# Patient Record
Sex: Female | Born: 1935 | Race: White | Hispanic: No | Smoking: Former smoker
Health system: Southern US, Community
[De-identification: ages and names within clinical notes are randomized; demographics above are authoritative.]

## PROBLEM LIST (undated history)

## (undated) DIAGNOSIS — R4701 Aphasia: Secondary | ICD-10-CM

## (undated) DIAGNOSIS — I313 Pericardial effusion (noninflammatory): Secondary | ICD-10-CM

## (undated) DIAGNOSIS — M1990 Unspecified osteoarthritis, unspecified site: Secondary | ICD-10-CM

## (undated) DIAGNOSIS — R002 Palpitations: Secondary | ICD-10-CM

## (undated) DIAGNOSIS — I480 Paroxysmal atrial fibrillation: Secondary | ICD-10-CM

## (undated) DIAGNOSIS — I619 Nontraumatic intracerebral hemorrhage, unspecified: Principal | ICD-10-CM

## (undated) DIAGNOSIS — F32A Depression, unspecified: Secondary | ICD-10-CM

## (undated) DIAGNOSIS — R51 Headache: Secondary | ICD-10-CM

## (undated) DIAGNOSIS — I10 Essential (primary) hypertension: Secondary | ICD-10-CM

## (undated) DIAGNOSIS — I4891 Unspecified atrial fibrillation: Principal | ICD-10-CM

## (undated) DIAGNOSIS — E785 Hyperlipidemia, unspecified: Secondary | ICD-10-CM

## (undated) DIAGNOSIS — Z950 Presence of cardiac pacemaker: Secondary | ICD-10-CM

## (undated) DIAGNOSIS — R600 Localized edema: Secondary | ICD-10-CM

## (undated) DIAGNOSIS — F329 Major depressive disorder, single episode, unspecified: Secondary | ICD-10-CM

## (undated) DIAGNOSIS — M069 Rheumatoid arthritis, unspecified: Secondary | ICD-10-CM

## (undated) DIAGNOSIS — I495 Sick sinus syndrome: Secondary | ICD-10-CM

## (undated) DIAGNOSIS — R609 Edema, unspecified: Secondary | ICD-10-CM

## (undated) DIAGNOSIS — R0602 Shortness of breath: Secondary | ICD-10-CM

## (undated) DIAGNOSIS — I3139 Other pericardial effusion (noninflammatory): Secondary | ICD-10-CM

## (undated) DIAGNOSIS — R519 Headache, unspecified: Secondary | ICD-10-CM

## (undated) DIAGNOSIS — I341 Nonrheumatic mitral (valve) prolapse: Secondary | ICD-10-CM

## (undated) DIAGNOSIS — I639 Cerebral infarction, unspecified: Secondary | ICD-10-CM

## (undated) HISTORY — DX: Aphasia: R47.01

## (undated) HISTORY — DX: Hyperlipidemia, unspecified: E78.5

## (undated) HISTORY — DX: Nonrheumatic mitral (valve) prolapse: I34.1

## (undated) HISTORY — DX: Headache, unspecified: R51.9

## (undated) HISTORY — DX: Palpitations: R00.2

## (undated) HISTORY — DX: Headache: R51

## (undated) HISTORY — PX: APPENDECTOMY: SHX54

## (undated) HISTORY — PX: BOWEL RESECTION: SHX1257

## (undated) HISTORY — DX: Paroxysmal atrial fibrillation: I48.0

## (undated) HISTORY — PX: PACEMAKER INSERTION: SHX728

## (undated) HISTORY — PX: BREAST LUMPECTOMY: SHX2

## (undated) HISTORY — DX: Localized edema: R60.0

## (undated) HISTORY — DX: Shortness of breath: R06.02

## (undated) HISTORY — PX: ABDOMINAL HYSTERECTOMY: SHX81

## (undated) HISTORY — DX: Cerebral infarction, unspecified: I63.9

## (undated) HISTORY — DX: Rheumatoid arthritis, unspecified: M06.9

## (undated) HISTORY — DX: Edema, unspecified: R60.9

## (undated) HISTORY — DX: Sick sinus syndrome: I49.5

---

## 2003-08-18 ENCOUNTER — Other Ambulatory Visit: Payer: Self-pay

## 2004-11-01 ENCOUNTER — Ambulatory Visit: Payer: Self-pay | Admitting: Ophthalmology

## 2004-11-13 ENCOUNTER — Ambulatory Visit: Payer: Self-pay | Admitting: Ophthalmology

## 2005-01-10 ENCOUNTER — Ambulatory Visit: Payer: Self-pay | Admitting: Ophthalmology

## 2005-01-15 ENCOUNTER — Ambulatory Visit: Payer: Self-pay | Admitting: Ophthalmology

## 2005-01-22 ENCOUNTER — Encounter: Payer: Self-pay | Admitting: Family Medicine

## 2005-02-21 ENCOUNTER — Encounter: Payer: Self-pay | Admitting: Family Medicine

## 2005-08-13 ENCOUNTER — Ambulatory Visit: Payer: Self-pay | Admitting: Family Medicine

## 2006-03-28 ENCOUNTER — Ambulatory Visit: Payer: Self-pay | Admitting: Family Medicine

## 2006-07-07 ENCOUNTER — Ambulatory Visit: Payer: Self-pay | Admitting: Family Medicine

## 2006-08-18 ENCOUNTER — Ambulatory Visit: Payer: Self-pay | Admitting: Family Medicine

## 2007-01-10 ENCOUNTER — Emergency Department: Payer: Self-pay | Admitting: Emergency Medicine

## 2007-08-23 ENCOUNTER — Ambulatory Visit: Payer: Self-pay | Admitting: Family Medicine

## 2007-10-18 ENCOUNTER — Ambulatory Visit: Payer: Self-pay | Admitting: Cardiology

## 2007-11-05 ENCOUNTER — Ambulatory Visit: Payer: Self-pay

## 2008-01-19 ENCOUNTER — Ambulatory Visit: Payer: Self-pay | Admitting: Family Medicine

## 2009-07-16 ENCOUNTER — Ambulatory Visit: Payer: Self-pay | Admitting: Family Medicine

## 2010-06-23 DIAGNOSIS — I4891 Unspecified atrial fibrillation: Principal | ICD-10-CM

## 2010-06-23 HISTORY — DX: Unspecified atrial fibrillation: I48.91

## 2010-11-05 NOTE — Assessment & Plan Note (Signed)
[facility] OFFICE NOTE   NAME:TITUSZalia, Hautala                        MRN:          [id number]  DATE:10/18/2007                            DOB:          Jun 12, 1930    REFERRING PHYSICIAN:  Lorie Phenix   I was asked by Dr. Elease Hashimoto to consult regarding Ann Kaufman, a  delightful 77 year old white female with some left chest and left arm  tingling.   She has been having this intermittently and cannot relate it to any  particular activity.  It clearly is not exertional.  She denies any  chest trauma or any previous history of any chest disorders.   She denies anything other than some sinus congestion but no hemoptysis.  She has had no dysphasia or any significant reflux symptoms.   She denies any orthopnea, PND or peripheral edema.  She has had no  presyncope, syncope or palpitations.  She has nothing that sounds like  exertional angina.   PAST MEDICAL HISTORY:  1. Significant for no known dye allergies.  2. She has no known drug allergies.  3. She has a history of a heart murmur.  4. Some swelling in her ankles and high blood pressure.   MEDICATIONS:  1. Aspirin 81 mg a day.  2. Hydrochlorothiazide 12.5 mg a day.  3. Nexium 40 mg a day.   ALLERGIES:  She has no known drug allergies as mentioned above.   She quit smoking in 1978.  She has maybe one alcoholic beverage a month.  She drinks 3-4 cups of caffeinated beverage a day.  She does have  elevated cholesterol and this was just recently redrawn by Dr. Elease Hashimoto.   SURGICAL HISTORY:  1. Hysterectomy.  2. Breast cyst removed.  3. Appendectomy.  4. Ovarian cyst.  5. Teratoma tumor.   FAMILY HISTORY:  Negative for premature coronary disease.  However, her  father did have an abdominal aortic aneurysm but he was in later years.   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  She is widowed, has 2  biological children, 3  other children from another marriage.  She has 2 grandchildren that live  here that she is very active with.   REVIEW OF SYSTEMS:  Other than some reflux on occasion and some  constipation, really negative for all review of systems.   PHYSICAL EXAMINATION:  VITAL SIGNS:  Blood pressure is 170/75, her pulse  is 51 and regular.  EKG shows sinus brady, possible left atrial  enlargement, a left anterior fascicular block and ST-segment changes  laterally.  HEENT:  Normocephalic, atraumatic.  PERRL.  Extraocular is intact.  Sclerae are clear.  Face symmetry is normal.  NECK:  Carotid upstrokes are equal bilaterally without  bruits, no JVD.  Thyroid is not enlarged.  Neck is supple.  LUNGS:  Clear.  HEART:  A soft systolic murmur along the left sternal border.  S2 splits  physiologically.  There is no gallop.  There is no chest wall  tenderness.  ABDOMEN:  Soft, good bowel sounds.  She has a very prominent pulsatile  aorta in the supraumbilical area.  She has a soft systolic sound and a  very prominent pulsation by auscultation.  There is no hepatomegaly.  There is no organomegaly.  EXTREMITIES:  No cyanosis, clubbing or significant edema.  Pulses are  brisk.  NEURO:  Exam is intact.  SKIN:  Unremarkable.   ASSESSMENT:  1. Noncardiac chest pain.  2. Abnormal EKG.  3. History of hyperlipidemia being followed by Dr. Elease Hashimoto.  4. Prominent pulsatile abdominal aorta in the supraumbilical area.      Rule out aneurysm particularly in light of the fact that her father      had an aneurysm.   PLAN:  1. Abdominal ultrasound.  2. Reassurance.  3. See [place] back on a p.r.n. basis.   If she has any significant atherosclerosis of her aorta and/or small  aneurysm, she needs aggressive treatment of her hypertension and  hyperlipidemia.  I will leave this to Dr. Santiago Bur expertise.     Thomas C. Daleen Squibb, MD, [facility]  Electronically Signed   TCW/MedQ  DD: 10/18/2007  DT:  10/18/2007  Job #: [id number]

## 2011-05-25 ENCOUNTER — Emergency Department: Payer: Self-pay | Admitting: *Deleted

## 2011-06-09 ENCOUNTER — Emergency Department: Payer: Self-pay | Admitting: *Deleted

## 2011-06-26 DIAGNOSIS — I4891 Unspecified atrial fibrillation: Secondary | ICD-10-CM | POA: Diagnosis not present

## 2011-06-26 DIAGNOSIS — I059 Rheumatic mitral valve disease, unspecified: Secondary | ICD-10-CM | POA: Diagnosis not present

## 2011-06-26 DIAGNOSIS — L408 Other psoriasis: Secondary | ICD-10-CM | POA: Diagnosis not present

## 2011-06-26 DIAGNOSIS — M1990 Unspecified osteoarthritis, unspecified site: Secondary | ICD-10-CM | POA: Diagnosis present

## 2011-06-26 DIAGNOSIS — M949 Disorder of cartilage, unspecified: Secondary | ICD-10-CM | POA: Diagnosis not present

## 2011-06-26 DIAGNOSIS — I10 Essential (primary) hypertension: Secondary | ICD-10-CM | POA: Diagnosis not present

## 2011-06-26 DIAGNOSIS — Z7982 Long term (current) use of aspirin: Secondary | ICD-10-CM | POA: Diagnosis not present

## 2011-06-26 DIAGNOSIS — M899 Disorder of bone, unspecified: Secondary | ICD-10-CM | POA: Diagnosis present

## 2011-06-26 DIAGNOSIS — R079 Chest pain, unspecified: Secondary | ICD-10-CM | POA: Diagnosis not present

## 2011-06-26 DIAGNOSIS — R002 Palpitations: Secondary | ICD-10-CM | POA: Diagnosis not present

## 2011-07-03 DIAGNOSIS — I10 Essential (primary) hypertension: Secondary | ICD-10-CM | POA: Diagnosis not present

## 2011-07-03 DIAGNOSIS — I059 Rheumatic mitral valve disease, unspecified: Secondary | ICD-10-CM | POA: Diagnosis not present

## 2011-07-03 DIAGNOSIS — I4891 Unspecified atrial fibrillation: Secondary | ICD-10-CM | POA: Diagnosis not present

## 2011-07-18 DIAGNOSIS — I4891 Unspecified atrial fibrillation: Secondary | ICD-10-CM | POA: Diagnosis not present

## 2011-07-21 DIAGNOSIS — I4891 Unspecified atrial fibrillation: Secondary | ICD-10-CM | POA: Diagnosis not present

## 2011-07-21 DIAGNOSIS — F329 Major depressive disorder, single episode, unspecified: Secondary | ICD-10-CM | POA: Diagnosis not present

## 2011-07-21 DIAGNOSIS — E559 Vitamin D deficiency, unspecified: Secondary | ICD-10-CM | POA: Diagnosis not present

## 2011-07-21 DIAGNOSIS — I10 Essential (primary) hypertension: Secondary | ICD-10-CM | POA: Diagnosis not present

## 2011-08-13 DIAGNOSIS — Z79899 Other long term (current) drug therapy: Secondary | ICD-10-CM | POA: Diagnosis not present

## 2011-08-13 DIAGNOSIS — L820 Inflamed seborrheic keratosis: Secondary | ICD-10-CM | POA: Diagnosis not present

## 2011-08-13 DIAGNOSIS — L719 Rosacea, unspecified: Secondary | ICD-10-CM | POA: Diagnosis not present

## 2011-08-13 DIAGNOSIS — D1800 Hemangioma unspecified site: Secondary | ICD-10-CM | POA: Diagnosis not present

## 2011-08-13 DIAGNOSIS — L408 Other psoriasis: Secondary | ICD-10-CM | POA: Diagnosis not present

## 2011-08-15 DIAGNOSIS — I4891 Unspecified atrial fibrillation: Secondary | ICD-10-CM | POA: Diagnosis not present

## 2011-08-23 DIAGNOSIS — F329 Major depressive disorder, single episode, unspecified: Secondary | ICD-10-CM | POA: Diagnosis not present

## 2011-08-23 DIAGNOSIS — E559 Vitamin D deficiency, unspecified: Secondary | ICD-10-CM | POA: Diagnosis not present

## 2011-08-23 DIAGNOSIS — I10 Essential (primary) hypertension: Secondary | ICD-10-CM | POA: Diagnosis not present

## 2011-08-23 DIAGNOSIS — J0100 Acute maxillary sinusitis, unspecified: Secondary | ICD-10-CM | POA: Diagnosis not present

## 2011-08-29 DIAGNOSIS — I495 Sick sinus syndrome: Secondary | ICD-10-CM | POA: Diagnosis not present

## 2011-08-29 DIAGNOSIS — I4891 Unspecified atrial fibrillation: Secondary | ICD-10-CM | POA: Diagnosis not present

## 2011-09-05 DIAGNOSIS — I10 Essential (primary) hypertension: Secondary | ICD-10-CM | POA: Diagnosis not present

## 2011-09-05 DIAGNOSIS — I4891 Unspecified atrial fibrillation: Secondary | ICD-10-CM | POA: Diagnosis not present

## 2011-09-05 DIAGNOSIS — I495 Sick sinus syndrome: Secondary | ICD-10-CM | POA: Diagnosis not present

## 2011-09-08 ENCOUNTER — Ambulatory Visit: Payer: Self-pay | Admitting: Cardiology

## 2011-09-08 DIAGNOSIS — R918 Other nonspecific abnormal finding of lung field: Secondary | ICD-10-CM | POA: Diagnosis not present

## 2011-09-08 DIAGNOSIS — I495 Sick sinus syndrome: Secondary | ICD-10-CM | POA: Diagnosis not present

## 2011-09-08 DIAGNOSIS — Z01812 Encounter for preprocedural laboratory examination: Secondary | ICD-10-CM | POA: Diagnosis not present

## 2011-09-08 DIAGNOSIS — Z01818 Encounter for other preprocedural examination: Secondary | ICD-10-CM | POA: Diagnosis not present

## 2011-09-08 DIAGNOSIS — R079 Chest pain, unspecified: Secondary | ICD-10-CM | POA: Diagnosis not present

## 2011-09-08 LAB — PROTIME-INR
INR: 2.4
Prothrombin Time: 26.5 secs — ABNORMAL HIGH (ref 11.5–14.7)

## 2011-09-08 LAB — CBC WITH DIFFERENTIAL/PLATELET
Basophil #: 0.1 10*3/uL (ref 0.0–0.1)
Eosinophil #: 0.1 10*3/uL (ref 0.0–0.7)
HCT: 39.1 % (ref 35.0–47.0)
Lymphocyte #: 1.7 10*3/uL (ref 1.0–3.6)
Lymphocyte %: 22.7 %
MCHC: 33.6 g/dL (ref 32.0–36.0)
MCV: 91 fL (ref 80–100)
Monocyte %: 10.7 %
Neutrophil #: 4.7 10*3/uL (ref 1.4–6.5)
Neutrophil %: 63.9 %
Platelet: 236 10*3/uL (ref 150–440)
RDW: 13.9 % (ref 11.5–14.5)
WBC: 7.4 10*3/uL (ref 3.6–11.0)

## 2011-09-08 LAB — BASIC METABOLIC PANEL
Anion Gap: 12 (ref 7–16)
BUN: 17 mg/dL (ref 7–18)
Chloride: 102 mmol/L (ref 98–107)
Creatinine: 1 mg/dL (ref 0.60–1.30)
EGFR (African American): >60
EGFR (Non-African Amer.): 57 — ABNORMAL LOW
Glucose: 86 mg/dL (ref 65–99)
Osmolality: 280 (ref 275–301)

## 2011-09-08 LAB — APTT: Activated PTT: 46.8 secs — ABNORMAL HIGH (ref 23.6–35.9)

## 2011-09-09 ENCOUNTER — Ambulatory Visit: Payer: Self-pay | Admitting: Cardiology

## 2011-09-09 DIAGNOSIS — M069 Rheumatoid arthritis, unspecified: Secondary | ICD-10-CM | POA: Diagnosis not present

## 2011-09-09 DIAGNOSIS — I4891 Unspecified atrial fibrillation: Secondary | ICD-10-CM | POA: Diagnosis not present

## 2011-09-09 DIAGNOSIS — E785 Hyperlipidemia, unspecified: Secondary | ICD-10-CM | POA: Diagnosis not present

## 2011-09-09 DIAGNOSIS — I059 Rheumatic mitral valve disease, unspecified: Secondary | ICD-10-CM | POA: Diagnosis not present

## 2011-09-09 DIAGNOSIS — Z87891 Personal history of nicotine dependence: Secondary | ICD-10-CM | POA: Diagnosis not present

## 2011-09-09 DIAGNOSIS — I495 Sick sinus syndrome: Secondary | ICD-10-CM | POA: Diagnosis not present

## 2011-09-09 DIAGNOSIS — I10 Essential (primary) hypertension: Secondary | ICD-10-CM | POA: Diagnosis not present

## 2011-09-09 DIAGNOSIS — Z79899 Other long term (current) drug therapy: Secondary | ICD-10-CM | POA: Diagnosis not present

## 2011-09-09 DIAGNOSIS — R5383 Other fatigue: Secondary | ICD-10-CM | POA: Diagnosis not present

## 2011-09-09 DIAGNOSIS — Z950 Presence of cardiac pacemaker: Secondary | ICD-10-CM | POA: Diagnosis not present

## 2011-09-10 DIAGNOSIS — Z79899 Other long term (current) drug therapy: Secondary | ICD-10-CM | POA: Diagnosis not present

## 2011-09-10 DIAGNOSIS — I10 Essential (primary) hypertension: Secondary | ICD-10-CM | POA: Diagnosis not present

## 2011-09-10 DIAGNOSIS — I059 Rheumatic mitral valve disease, unspecified: Secondary | ICD-10-CM | POA: Diagnosis not present

## 2011-09-10 DIAGNOSIS — I495 Sick sinus syndrome: Secondary | ICD-10-CM | POA: Diagnosis not present

## 2011-09-10 DIAGNOSIS — I4891 Unspecified atrial fibrillation: Secondary | ICD-10-CM | POA: Diagnosis not present

## 2011-09-10 DIAGNOSIS — Z87891 Personal history of nicotine dependence: Secondary | ICD-10-CM | POA: Diagnosis not present

## 2011-09-11 DIAGNOSIS — I10 Essential (primary) hypertension: Secondary | ICD-10-CM | POA: Diagnosis not present

## 2011-09-11 DIAGNOSIS — I495 Sick sinus syndrome: Secondary | ICD-10-CM | POA: Diagnosis not present

## 2011-09-11 DIAGNOSIS — I4891 Unspecified atrial fibrillation: Secondary | ICD-10-CM | POA: Diagnosis not present

## 2011-09-12 DIAGNOSIS — L408 Other psoriasis: Secondary | ICD-10-CM | POA: Diagnosis not present

## 2011-09-12 DIAGNOSIS — M79609 Pain in unspecified limb: Secondary | ICD-10-CM | POA: Diagnosis not present

## 2011-09-12 DIAGNOSIS — T82190A Other mechanical complication of cardiac electrode, initial encounter: Secondary | ICD-10-CM | POA: Diagnosis not present

## 2011-09-12 DIAGNOSIS — I495 Sick sinus syndrome: Secondary | ICD-10-CM | POA: Diagnosis not present

## 2011-09-12 DIAGNOSIS — E871 Hypo-osmolality and hyponatremia: Secondary | ICD-10-CM | POA: Diagnosis not present

## 2011-09-12 DIAGNOSIS — R748 Abnormal levels of other serum enzymes: Secondary | ICD-10-CM | POA: Diagnosis not present

## 2011-09-12 DIAGNOSIS — R52 Pain, unspecified: Secondary | ICD-10-CM | POA: Diagnosis not present

## 2011-09-12 DIAGNOSIS — Z79899 Other long term (current) drug therapy: Secondary | ICD-10-CM | POA: Diagnosis not present

## 2011-09-12 DIAGNOSIS — R079 Chest pain, unspecified: Secondary | ICD-10-CM | POA: Diagnosis not present

## 2011-09-12 DIAGNOSIS — Z7982 Long term (current) use of aspirin: Secondary | ICD-10-CM | POA: Diagnosis not present

## 2011-09-12 DIAGNOSIS — I4891 Unspecified atrial fibrillation: Secondary | ICD-10-CM | POA: Diagnosis not present

## 2011-09-12 LAB — COMPREHENSIVE METABOLIC PANEL
Albumin: 3.8 g/dL (ref 3.4–5.0)
BUN: 14 mg/dL (ref 7–18)
Bilirubin,Total: 0.8 mg/dL (ref 0.2–1.0)
Calcium, Total: 9.2 mg/dL (ref 8.5–10.1)
Chloride: 101 mmol/L (ref 98–107)
Glucose: 89 mg/dL (ref 65–99)
Osmolality: 268 (ref 275–301)
Potassium: 4.3 mmol/L (ref 3.5–5.1)
Sodium: 134 mmol/L — ABNORMAL LOW (ref 136–145)
Total Protein: 7.8 g/dL (ref 6.4–8.2)

## 2011-09-12 LAB — CK TOTAL AND CKMB (NOT AT ARMC): CK-MB: 1.4 ng/mL (ref 0.5–3.6)

## 2011-09-12 LAB — CBC
HCT: 40.8 % (ref 35.0–47.0)
HGB: 13.9 g/dL (ref 12.0–16.0)
MCHC: 34 g/dL (ref 32.0–36.0)
MCV: 90 fL (ref 80–100)
RDW: 13.6 % (ref 11.5–14.5)
WBC: 6.7 10*3/uL (ref 3.6–11.0)

## 2011-09-12 LAB — PROTIME-INR
INR: 1
Prothrombin Time: 13.6 secs (ref 11.5–14.7)

## 2011-09-12 LAB — APTT: Activated PTT: 32 secs (ref 23.6–35.9)

## 2011-09-13 ENCOUNTER — Observation Stay: Payer: Self-pay | Admitting: Internal Medicine

## 2011-09-13 DIAGNOSIS — R748 Abnormal levels of other serum enzymes: Secondary | ICD-10-CM | POA: Diagnosis not present

## 2011-09-13 DIAGNOSIS — I495 Sick sinus syndrome: Secondary | ICD-10-CM | POA: Diagnosis not present

## 2011-09-13 DIAGNOSIS — R52 Pain, unspecified: Secondary | ICD-10-CM | POA: Diagnosis not present

## 2011-09-13 DIAGNOSIS — R799 Abnormal finding of blood chemistry, unspecified: Secondary | ICD-10-CM | POA: Diagnosis not present

## 2011-09-13 DIAGNOSIS — R079 Chest pain, unspecified: Secondary | ICD-10-CM | POA: Diagnosis not present

## 2011-09-13 DIAGNOSIS — R072 Precordial pain: Secondary | ICD-10-CM | POA: Diagnosis not present

## 2011-09-13 DIAGNOSIS — I4891 Unspecified atrial fibrillation: Secondary | ICD-10-CM | POA: Diagnosis not present

## 2011-09-13 LAB — TROPONIN I
Troponin-I: 0.1 ng/mL — ABNORMAL HIGH
Troponin-I: 0.1 ng/mL — ABNORMAL HIGH

## 2011-09-14 DIAGNOSIS — R079 Chest pain, unspecified: Secondary | ICD-10-CM | POA: Diagnosis not present

## 2011-09-14 DIAGNOSIS — I495 Sick sinus syndrome: Secondary | ICD-10-CM | POA: Diagnosis not present

## 2011-09-14 DIAGNOSIS — R072 Precordial pain: Secondary | ICD-10-CM | POA: Diagnosis not present

## 2011-09-14 DIAGNOSIS — I4891 Unspecified atrial fibrillation: Secondary | ICD-10-CM | POA: Diagnosis not present

## 2011-09-14 DIAGNOSIS — R799 Abnormal finding of blood chemistry, unspecified: Secondary | ICD-10-CM | POA: Diagnosis not present

## 2011-09-16 ENCOUNTER — Ambulatory Visit: Payer: Self-pay | Admitting: Cardiology

## 2011-09-16 DIAGNOSIS — I495 Sick sinus syndrome: Secondary | ICD-10-CM | POA: Diagnosis not present

## 2011-09-16 DIAGNOSIS — Z01818 Encounter for other preprocedural examination: Secondary | ICD-10-CM | POA: Diagnosis not present

## 2011-09-16 DIAGNOSIS — I499 Cardiac arrhythmia, unspecified: Secondary | ICD-10-CM | POA: Diagnosis not present

## 2011-09-16 DIAGNOSIS — Z01810 Encounter for preprocedural cardiovascular examination: Secondary | ICD-10-CM | POA: Diagnosis not present

## 2011-09-17 ENCOUNTER — Ambulatory Visit: Payer: Self-pay | Admitting: Cardiology

## 2011-09-17 DIAGNOSIS — I10 Essential (primary) hypertension: Secondary | ICD-10-CM | POA: Diagnosis not present

## 2011-09-17 DIAGNOSIS — I495 Sick sinus syndrome: Secondary | ICD-10-CM | POA: Diagnosis not present

## 2011-09-17 DIAGNOSIS — Z87891 Personal history of nicotine dependence: Secondary | ICD-10-CM | POA: Diagnosis not present

## 2011-09-17 DIAGNOSIS — I059 Rheumatic mitral valve disease, unspecified: Secondary | ICD-10-CM | POA: Diagnosis not present

## 2011-09-17 DIAGNOSIS — M129 Arthropathy, unspecified: Secondary | ICD-10-CM | POA: Diagnosis not present

## 2011-09-17 DIAGNOSIS — Z79899 Other long term (current) drug therapy: Secondary | ICD-10-CM | POA: Diagnosis not present

## 2011-09-17 DIAGNOSIS — G43909 Migraine, unspecified, not intractable, without status migrainosus: Secondary | ICD-10-CM | POA: Diagnosis not present

## 2011-09-17 DIAGNOSIS — M069 Rheumatoid arthritis, unspecified: Secondary | ICD-10-CM | POA: Diagnosis not present

## 2011-09-17 DIAGNOSIS — Z980 Intestinal bypass and anastomosis status: Secondary | ICD-10-CM | POA: Diagnosis not present

## 2011-09-17 DIAGNOSIS — Z85828 Personal history of other malignant neoplasm of skin: Secondary | ICD-10-CM | POA: Diagnosis not present

## 2011-09-17 DIAGNOSIS — Z7982 Long term (current) use of aspirin: Secondary | ICD-10-CM | POA: Diagnosis not present

## 2011-09-17 DIAGNOSIS — T82190A Other mechanical complication of cardiac electrode, initial encounter: Secondary | ICD-10-CM | POA: Diagnosis not present

## 2011-09-17 DIAGNOSIS — E785 Hyperlipidemia, unspecified: Secondary | ICD-10-CM | POA: Diagnosis not present

## 2011-09-17 DIAGNOSIS — K219 Gastro-esophageal reflux disease without esophagitis: Secondary | ICD-10-CM | POA: Diagnosis not present

## 2011-09-17 HISTORY — PX: LEAD REVISION: SHX5945

## 2011-10-17 DIAGNOSIS — F329 Major depressive disorder, single episode, unspecified: Secondary | ICD-10-CM | POA: Diagnosis not present

## 2011-10-17 DIAGNOSIS — I10 Essential (primary) hypertension: Secondary | ICD-10-CM | POA: Diagnosis not present

## 2011-10-17 DIAGNOSIS — I209 Angina pectoris, unspecified: Secondary | ICD-10-CM | POA: Diagnosis not present

## 2011-10-17 DIAGNOSIS — I4891 Unspecified atrial fibrillation: Secondary | ICD-10-CM | POA: Diagnosis not present

## 2011-10-17 DIAGNOSIS — E559 Vitamin D deficiency, unspecified: Secondary | ICD-10-CM | POA: Diagnosis not present

## 2011-10-17 DIAGNOSIS — Z79899 Other long term (current) drug therapy: Secondary | ICD-10-CM | POA: Diagnosis not present

## 2011-10-23 DIAGNOSIS — I10 Essential (primary) hypertension: Secondary | ICD-10-CM | POA: Diagnosis not present

## 2011-10-23 DIAGNOSIS — I519 Heart disease, unspecified: Secondary | ICD-10-CM | POA: Diagnosis not present

## 2011-10-23 DIAGNOSIS — I495 Sick sinus syndrome: Secondary | ICD-10-CM | POA: Diagnosis not present

## 2011-10-23 DIAGNOSIS — R079 Chest pain, unspecified: Secondary | ICD-10-CM | POA: Diagnosis not present

## 2011-10-23 DIAGNOSIS — I4891 Unspecified atrial fibrillation: Secondary | ICD-10-CM | POA: Diagnosis not present

## 2011-10-23 DIAGNOSIS — I319 Disease of pericardium, unspecified: Secondary | ICD-10-CM | POA: Diagnosis not present

## 2011-10-27 DIAGNOSIS — I519 Heart disease, unspecified: Secondary | ICD-10-CM | POA: Diagnosis not present

## 2011-10-27 DIAGNOSIS — I319 Disease of pericardium, unspecified: Secondary | ICD-10-CM | POA: Diagnosis not present

## 2011-10-27 DIAGNOSIS — R079 Chest pain, unspecified: Secondary | ICD-10-CM | POA: Diagnosis not present

## 2011-10-27 DIAGNOSIS — I10 Essential (primary) hypertension: Secondary | ICD-10-CM | POA: Diagnosis not present

## 2011-10-27 DIAGNOSIS — I495 Sick sinus syndrome: Secondary | ICD-10-CM | POA: Diagnosis not present

## 2011-10-27 DIAGNOSIS — I4891 Unspecified atrial fibrillation: Secondary | ICD-10-CM | POA: Diagnosis not present

## 2011-10-31 DIAGNOSIS — I509 Heart failure, unspecified: Secondary | ICD-10-CM | POA: Diagnosis not present

## 2011-10-31 DIAGNOSIS — J811 Chronic pulmonary edema: Secondary | ICD-10-CM | POA: Diagnosis not present

## 2011-10-31 DIAGNOSIS — I319 Disease of pericardium, unspecified: Secondary | ICD-10-CM | POA: Diagnosis not present

## 2011-10-31 DIAGNOSIS — I499 Cardiac arrhythmia, unspecified: Secondary | ICD-10-CM | POA: Diagnosis not present

## 2011-10-31 DIAGNOSIS — Z950 Presence of cardiac pacemaker: Secondary | ICD-10-CM | POA: Diagnosis not present

## 2011-10-31 DIAGNOSIS — E785 Hyperlipidemia, unspecified: Secondary | ICD-10-CM | POA: Diagnosis present

## 2011-10-31 DIAGNOSIS — I495 Sick sinus syndrome: Secondary | ICD-10-CM | POA: Diagnosis not present

## 2011-10-31 DIAGNOSIS — I2510 Atherosclerotic heart disease of native coronary artery without angina pectoris: Secondary | ICD-10-CM | POA: Diagnosis not present

## 2011-10-31 DIAGNOSIS — J9819 Other pulmonary collapse: Secondary | ICD-10-CM | POA: Diagnosis not present

## 2011-10-31 DIAGNOSIS — I059 Rheumatic mitral valve disease, unspecified: Secondary | ICD-10-CM | POA: Diagnosis not present

## 2011-10-31 DIAGNOSIS — I10 Essential (primary) hypertension: Secondary | ICD-10-CM | POA: Diagnosis not present

## 2011-10-31 DIAGNOSIS — J90 Pleural effusion, not elsewhere classified: Secondary | ICD-10-CM | POA: Diagnosis not present

## 2011-10-31 DIAGNOSIS — M069 Rheumatoid arthritis, unspecified: Secondary | ICD-10-CM | POA: Diagnosis present

## 2011-10-31 DIAGNOSIS — I312 Hemopericardium, not elsewhere classified: Secondary | ICD-10-CM | POA: Diagnosis not present

## 2011-10-31 DIAGNOSIS — R0602 Shortness of breath: Secondary | ICD-10-CM | POA: Diagnosis not present

## 2011-10-31 DIAGNOSIS — R079 Chest pain, unspecified: Secondary | ICD-10-CM | POA: Diagnosis not present

## 2011-10-31 DIAGNOSIS — I314 Cardiac tamponade: Secondary | ICD-10-CM | POA: Diagnosis not present

## 2011-10-31 DIAGNOSIS — L408 Other psoriasis: Secondary | ICD-10-CM | POA: Diagnosis present

## 2011-10-31 DIAGNOSIS — I4891 Unspecified atrial fibrillation: Secondary | ICD-10-CM | POA: Diagnosis not present

## 2011-10-31 DIAGNOSIS — I519 Heart disease, unspecified: Secondary | ICD-10-CM | POA: Diagnosis not present

## 2011-11-24 DIAGNOSIS — I4891 Unspecified atrial fibrillation: Secondary | ICD-10-CM | POA: Diagnosis not present

## 2011-11-24 DIAGNOSIS — I10 Essential (primary) hypertension: Secondary | ICD-10-CM | POA: Diagnosis not present

## 2011-11-24 DIAGNOSIS — J90 Pleural effusion, not elsewhere classified: Secondary | ICD-10-CM | POA: Diagnosis not present

## 2011-11-24 DIAGNOSIS — I495 Sick sinus syndrome: Secondary | ICD-10-CM | POA: Diagnosis not present

## 2011-11-24 DIAGNOSIS — R079 Chest pain, unspecified: Secondary | ICD-10-CM | POA: Diagnosis not present

## 2011-11-24 DIAGNOSIS — I319 Disease of pericardium, unspecified: Secondary | ICD-10-CM | POA: Diagnosis not present

## 2011-11-26 ENCOUNTER — Emergency Department (HOSPITAL_COMMUNITY): Payer: Medicare Other

## 2011-11-26 ENCOUNTER — Inpatient Hospital Stay (HOSPITAL_COMMUNITY)
Admission: EM | Admit: 2011-11-26 | Discharge: 2011-11-28 | DRG: 309 | Disposition: A | Discharge status: Home / Self Care | Payer: Medicare Other | Attending: Internal Medicine | Admitting: Internal Medicine

## 2011-11-26 ENCOUNTER — Encounter (HOSPITAL_COMMUNITY): Payer: Self-pay | Admitting: Emergency Medicine

## 2011-11-26 ENCOUNTER — Other Ambulatory Visit: Payer: Self-pay

## 2011-11-26 DIAGNOSIS — Z79899 Other long term (current) drug therapy: Secondary | ICD-10-CM

## 2011-11-26 DIAGNOSIS — E876 Hypokalemia: Secondary | ICD-10-CM | POA: Diagnosis present

## 2011-11-26 DIAGNOSIS — I4891 Unspecified atrial fibrillation: Principal | ICD-10-CM | POA: Diagnosis present

## 2011-11-26 DIAGNOSIS — R918 Other nonspecific abnormal finding of lung field: Secondary | ICD-10-CM | POA: Diagnosis not present

## 2011-11-26 DIAGNOSIS — I7090 Unspecified atherosclerosis: Secondary | ICD-10-CM | POA: Diagnosis not present

## 2011-11-26 DIAGNOSIS — F329 Major depressive disorder, single episode, unspecified: Secondary | ICD-10-CM | POA: Diagnosis present

## 2011-11-26 DIAGNOSIS — I4901 Ventricular fibrillation: Secondary | ICD-10-CM | POA: Diagnosis not present

## 2011-11-26 DIAGNOSIS — R079 Chest pain, unspecified: Secondary | ICD-10-CM | POA: Diagnosis not present

## 2011-11-26 DIAGNOSIS — J90 Pleural effusion, not elsewhere classified: Secondary | ICD-10-CM | POA: Diagnosis present

## 2011-11-26 DIAGNOSIS — F3289 Other specified depressive episodes: Secondary | ICD-10-CM | POA: Diagnosis present

## 2011-11-26 DIAGNOSIS — R002 Palpitations: Secondary | ICD-10-CM | POA: Diagnosis present

## 2011-11-26 DIAGNOSIS — Z23 Encounter for immunization: Secondary | ICD-10-CM

## 2011-11-26 DIAGNOSIS — I10 Essential (primary) hypertension: Secondary | ICD-10-CM | POA: Diagnosis not present

## 2011-11-26 DIAGNOSIS — R0789 Other chest pain: Secondary | ICD-10-CM | POA: Diagnosis not present

## 2011-11-26 DIAGNOSIS — Z950 Presence of cardiac pacemaker: Secondary | ICD-10-CM

## 2011-11-26 HISTORY — DX: Depression, unspecified: F32.A

## 2011-11-26 HISTORY — DX: Presence of cardiac pacemaker: Z95.0

## 2011-11-26 HISTORY — DX: Other pericardial effusion (noninflammatory): I31.39

## 2011-11-26 HISTORY — DX: Essential (primary) hypertension: I10

## 2011-11-26 HISTORY — DX: Major depressive disorder, single episode, unspecified: F32.9

## 2011-11-26 HISTORY — DX: Pericardial effusion (noninflammatory): I31.3

## 2011-11-26 HISTORY — DX: Unspecified osteoarthritis, unspecified site: M19.90

## 2011-11-26 HISTORY — DX: Unspecified atrial fibrillation: I48.91

## 2011-11-26 LAB — PROTIME-INR: Prothrombin Time: 30 seconds — ABNORMAL HIGH (ref 11.6–15.2)

## 2011-11-26 LAB — DIFFERENTIAL
Basophils Relative: 1 % (ref 0–1)
Eosinophils Absolute: 0.2 10*3/uL (ref 0.0–0.7)
Eosinophils Relative: 3 % (ref 0–5)
Monocytes Absolute: 0.8 10*3/uL (ref 0.1–1.0)
Monocytes Relative: 14 % — ABNORMAL HIGH (ref 3–12)

## 2011-11-26 LAB — BASIC METABOLIC PANEL
BUN: 17 mg/dL (ref 6–23)
Chloride: 101 mEq/L (ref 96–112)
Creatinine, Ser: 1.06 mg/dL (ref 0.50–1.10)
GFR calc Af Amer: 56 mL/min — ABNORMAL LOW (ref >90)

## 2011-11-26 LAB — D-DIMER, QUANTITATIVE: D-Dimer, Quant: 3.3 ug/mL-FEU — ABNORMAL HIGH (ref 0.00–0.48)

## 2011-11-26 LAB — CBC
HCT: 36.3 % (ref 36.0–46.0)
Hemoglobin: 11.7 g/dL — ABNORMAL LOW (ref 12.0–15.0)
MCH: 27 pg (ref 26.0–34.0)
MCHC: 32.2 g/dL (ref 30.0–36.0)
MCV: 83.8 fL (ref 78.0–100.0)

## 2011-11-26 LAB — PRO B NATRIURETIC PEPTIDE: Pro B Natriuretic peptide (BNP): 3131 pg/mL — ABNORMAL HIGH (ref 0–450)

## 2011-11-26 LAB — CARDIAC PANEL(CRET KIN+CKTOT+MB+TROPI): CK, MB: 2.1 ng/mL (ref 0.3–4.0)

## 2011-11-26 MED ORDER — IOHEXOL 350 MG/ML SOLN: 100.0000 mL | Freq: Once | INTRAVENOUS | Status: AC | PRN

## 2011-11-26 MED ORDER — DILTIAZEM HCL 50 MG/10ML IV SOLN
10.0000 mg | Freq: Once | INTRAVENOUS | Status: AC
  Administered 2011-11-26: 10 mg via INTRAVENOUS

## 2011-11-26 MED ORDER — DILTIAZEM HCL 25 MG/5ML IV SOLN
INTRAVENOUS | Status: AC
  Filled 2011-11-26: qty 5

## 2011-11-26 NOTE — ED Notes (Signed)
Pt in c/o episode of palpitations that started this afternoon, pt with history of a.fib and recent pacemaker placement, pt states the palpitations started upon standing and have worsened, c/o mid sternal chest pressure and shortness of breath, denies nausea or vomiting today, but states she had n/v the last two days after starting zithromax for a cough, pt rates pain 4/10 at this time

## 2011-11-26 NOTE — ED Provider Notes (Signed)
History     CSN: [id number]  Arrival date & time 11/26/11  2109   First MD Initiated Contact with Patient 11/26/11 2135      Chief Complaint  Patient presents with  . Chest Pain    (Consider location/radiation/quality/duration/timing/severity/associated sxs/prior treatment) HPI Comments: Patient presents with palpitations, chest pressure, shortness of breath and lightheadedness that onset around 6 PM tonight. They're worse with getting up from a sitting position. She is a history of atrial fibrillation with pacemaker on Xarelto. She was recently hospitalized at [facility] for pericardial effusion and drainage. Her primary cardiologist is Dr. Emerson Monte in [place].   The history is provided by the patient.    Past Medical History  Diagnosis Date  . Pacemaker   . Atrial fibrillation   . Pericardial effusion   . Hypertension   . Depression   . Arthritis     Past Surgical History  Procedure Date  . Pacemaker insertion   . Abdominal hysterectomy   . Breast lumpectomy   . Appendectomy   . Bowel resection     No family history on file.  History  Substance Use Topics  . Smoking status: Never Smoker   . Smokeless tobacco: Not on file  . Alcohol Use: No    OB History    Grav Para Term Preterm Abortions TAB SAB Ect Mult Living                  Review of Systems  Allergies  Review of patient's allergies indicates no known allergies.  Home Medications   Current Outpatient Rx  Name Route Sig Dispense Refill  . AMIODARONE HCL 200 MG PO TABS Oral Take 200 mg by mouth daily.    . AZITHROMYCIN 250 MG PO TABS Oral Take 250 mg by mouth daily. On day 4 of her zpack    . ESOMEPRAZOLE MAGNESIUM 40 MG PO CPDR Oral Take 40 mg by mouth daily before breakfast.    . FOLIC ACID 400 MCG PO TABS Oral Take 400 mcg by mouth daily.    . FUROSEMIDE 40 MG PO TABS Oral Take 40 mg by mouth daily.    . IPRATROPIUM BROMIDE HFA 17 MCG/ACT IN AERS Inhalation Inhale 2 puffs into the lungs  every 6 (six) hours.    Marland Kitchen METHOTREXATE 2.5 MG PO TABS Oral Take 15 mg by mouth once a week. Caution:Chemotherapy. Protect from light.    Marland Kitchen METOPROLOL TARTRATE 50 MG PO TABS Oral Take 50 mg by mouth 2 (two) times daily.    Marland Kitchen RIVAROXABAN 10 MG PO TABS Oral Take 20 mg by mouth daily.    . SERTRALINE HCL 50 MG PO TABS Oral Take 50 mg by mouth at bedtime.      BP 128/77  Pulse 100  Temp(Src) 98.1 F (36.7 C) (Oral)  Resp 21  SpO2 98%  Physical Exam  Constitutional: She is oriented to person, place, and time. She appears well-developed and well-nourished. No distress.  HENT:  Head: Normocephalic and atraumatic.  Mouth/Throat: Oropharynx is clear and moist. No oropharyngeal exudate.  Eyes: Conjunctivae are normal. Pupils are equal, round, and reactive to light.  Neck: Normal range of motion. Neck supple.  Cardiovascular: Normal rate and normal heart sounds.   No murmur heard.      irregular rhythm  Pulmonary/Chest: Effort normal and breath sounds normal. No respiratory distress.       Breath sounds decreased L base.  Abdominal: Soft. There is no tenderness. There is no  rebound and no guarding.  Musculoskeletal: Normal range of motion. She exhibits no edema and no tenderness.  Neurological: She is alert and oriented to person, place, and time. No cranial nerve deficit.  Skin: Skin is warm.    ED Course  Procedures (including critical care time)  Labs Reviewed  CBC - Abnormal; Notable for the following:    Hemoglobin 11.7 (*)    RDW 16.0 (*)    All other components within normal limits  DIFFERENTIAL - Abnormal; Notable for the following:    Monocytes Relative 14 (*)    All other components within normal limits  BASIC METABOLIC PANEL - Abnormal; Notable for the following:    Glucose, Bld 116 (*)    GFR calc non Af Amer 49 (*)    GFR calc Af Amer 56 (*)    All other components within normal limits  PRO B NATRIURETIC PEPTIDE - Abnormal; Notable for the following:    Pro B  Natriuretic peptide (BNP) 3131.0 (*)    All other components within normal limits  PROTIME-INR - Abnormal; Notable for the following:    Prothrombin Time 30.0 (*)    INR 2.81 (*)    All other components within normal limits  POCT I-STAT TROPONIN I  CARDIAC PANEL(CRET KIN+CKTOT+MB+TROPI)  URINALYSIS, ROUTINE W REFLEX MICROSCOPIC  D-DIMER, QUANTITATIVE   Dg Chest 2 View  11/26/2011  *RADIOLOGY REPORT*  Clinical Data: Chest pain and shortness of breath.  CHEST - 2 VIEW  Comparison: No priors.  Findings: There are bilateral pleural effusions (moderate on the left and small on the right).  Associated bibasilar opacities (left greater than right) are favored to represent areas of passive atelectasis, however, underlying air space consolidation (particularly in the left lower lobe) is difficult to exclude. Mild pulmonary venous congestion without frank pulmonary edema. Heart size is borderline enlarged.  Mediastinal contours are unremarkable.  Atherosclerotic calcifications within the arch of the aorta.  There is a left-sided pacemaker device in place with lead tips projecting in the expected location of the right atrial appendage and right ventricular apex.  IMPRESSION: 1.  Moderate left and small right-sided pleural effusions. 2.  Bibasilar opacities (left greater than right), favored to represent areas of passive atelectasis. 3.  Atherosclerosis. 4.  Pacemaker device in place, as above.  Original Report Authenticated By: Florencia Reasons, M.D.     No diagnosis found.    MDM  Palpitations, lightheadedness, chest pressure and shortness of breath. Complicated cardiac history including sick sinus syndrome with pacemaker insertion. Recent admission to [facility] for pericardial effusion.  Chest x-ray, labs, obtain records  Case d/w Dr. Mayford Knife of cardiology.  CT chest obtained to evaluate for pericardial effusion as echo unavailable.  No significant pericardial effusion. Large Left pleural  effusion on CXR.  Concern for hemorrhagic component.  Will admit for thoracentesis, rate control of Afib.   Date: 11/26/2011  Rate: 111  Rhythm: atrial fibrillation  QRS Axis: left  Intervals: normal  ST/T Wave abnormalities: nonspecific ST/T changes  Conduction Disutrbances:none  Narrative Interpretation: anterior Q waves  Old EKG Reviewed: none available   Records obtained from [facility]. Patient admitted May 10-17 pericardial tamponade. 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 She developed SOB, LE edema, found to be in heart failure.  Increasing pericardial effusion and transferred to [facility] for pericardiocentesis.  Drained 500 ml of bloody fluid attributed to pacemaker revision.  Never had hemodynamic compromise. Did convert to sinus with amiodarone but went back into  atrial fibrillation.  CRITICAL CARE Performed by: Glynn Octave   Total critical care time: 30  Critical care time was exclusive of separately billable procedures and treating other patients.  Critical care was necessary to treat or prevent imminent or life-threatening deterioration.  Critical care was time spent personally by me on the following activities: development of treatment plan with patient and/or surrogate as well as nursing, discussions with consultants, evaluation of patient's response to treatment, examination of patient, obtaining history from patient or surrogate, ordering and performing treatments and interventions, ordering and review of laboratory studies, ordering and review of radiographic studies, pulse oximetry and re-evaluation of patient's condition.   Glynn Octave, MD 11/27/11 1320

## 2011-11-26 NOTE — ED Notes (Signed)
MEdtronic call, reports battery and medtronic are WNL, reports ventricular rates above 100 and atrial rates above 300s.

## 2011-11-26 NOTE — ED Notes (Signed)
PT. REPORTS PALPITATIONS WITH SUBSTERNAL CHEST PAIN /PRESSURE THIS EVENING WITH DIZZINESS / LIGHTHEADED. SLIGHT SOB , NO NAUSEA OR DIAPHORESIS.

## 2011-11-27 ENCOUNTER — Encounter (HOSPITAL_COMMUNITY): Payer: Self-pay | Admitting: Internal Medicine

## 2011-11-27 DIAGNOSIS — I7090 Unspecified atherosclerosis: Secondary | ICD-10-CM | POA: Diagnosis not present

## 2011-11-27 DIAGNOSIS — R002 Palpitations: Secondary | ICD-10-CM | POA: Diagnosis not present

## 2011-11-27 DIAGNOSIS — R918 Other nonspecific abnormal finding of lung field: Secondary | ICD-10-CM | POA: Diagnosis not present

## 2011-11-27 DIAGNOSIS — J90 Pleural effusion, not elsewhere classified: Secondary | ICD-10-CM | POA: Diagnosis present

## 2011-11-27 DIAGNOSIS — I4891 Unspecified atrial fibrillation: Secondary | ICD-10-CM | POA: Diagnosis not present

## 2011-11-27 DIAGNOSIS — E876 Hypokalemia: Secondary | ICD-10-CM | POA: Diagnosis present

## 2011-11-27 DIAGNOSIS — I10 Essential (primary) hypertension: Secondary | ICD-10-CM | POA: Diagnosis not present

## 2011-11-27 DIAGNOSIS — R079 Chest pain, unspecified: Secondary | ICD-10-CM | POA: Diagnosis not present

## 2011-11-27 DIAGNOSIS — F329 Major depressive disorder, single episode, unspecified: Secondary | ICD-10-CM | POA: Diagnosis present

## 2011-11-27 DIAGNOSIS — R0789 Other chest pain: Secondary | ICD-10-CM | POA: Diagnosis not present

## 2011-11-27 DIAGNOSIS — Z79899 Other long term (current) drug therapy: Secondary | ICD-10-CM | POA: Diagnosis not present

## 2011-11-27 DIAGNOSIS — Z950 Presence of cardiac pacemaker: Secondary | ICD-10-CM | POA: Diagnosis not present

## 2011-11-27 DIAGNOSIS — Z23 Encounter for immunization: Secondary | ICD-10-CM | POA: Diagnosis not present

## 2011-11-27 DIAGNOSIS — I4901 Ventricular fibrillation: Secondary | ICD-10-CM | POA: Diagnosis not present

## 2011-11-27 LAB — CBC
MCV: 83.3 fL (ref 78.0–100.0)
Platelets: 223 10*3/uL (ref 150–400)
RBC: 4.07 MIL/uL (ref 3.87–5.11)
WBC: 5 10*3/uL (ref 4.0–10.5)

## 2011-11-27 LAB — COMPREHENSIVE METABOLIC PANEL
Albumin: 2.9 g/dL — ABNORMAL LOW (ref 3.5–5.2)
BUN: 15 mg/dL (ref 6–23)
Chloride: 102 mEq/L (ref 96–112)
Creatinine, Ser: 0.91 mg/dL (ref 0.50–1.10)
Total Bilirubin: 0.4 mg/dL (ref 0.3–1.2)
Total Protein: 6.6 g/dL (ref 6.0–8.3)

## 2011-11-27 LAB — CARDIAC PANEL(CRET KIN+CKTOT+MB+TROPI)
CK, MB: 2.3 ng/mL (ref 0.3–4.0)
CK, MB: 2.1 ng/mL (ref 0.3–4.0)
Relative Index: INVALID (ref 0.0–2.5)
Relative Index: INVALID (ref 0.0–2.5)
Total CK: 70 U/L (ref 7–177)
Troponin I: <0.3 ng/mL (ref <0.30)
Troponin I: <0.3 ng/mL
Troponin I: <0.3 ng/mL (ref <0.30)

## 2011-11-27 LAB — URINE MICROSCOPIC-ADD ON

## 2011-11-27 LAB — URINALYSIS, ROUTINE W REFLEX MICROSCOPIC
Bilirubin Urine: NEGATIVE
Protein, ur: NEGATIVE mg/dL
Urobilinogen, UA: 0.2 mg/dL (ref 0.0–1.0)

## 2011-11-27 LAB — MRSA PCR SCREENING: MRSA by PCR: NEGATIVE

## 2011-11-27 MED ORDER — METHOTREXATE 2.5 MG PO TABS: 15.0000 mg | ORAL_TABLET | ORAL | Status: DC

## 2011-11-27 MED ORDER — DILTIAZEM HCL 100 MG IV SOLR
5.0000 mg/h | INTRAVENOUS | Status: DC
  Administered 2011-11-27: 5 mg/h via INTRAVENOUS

## 2011-11-27 MED ORDER — PANTOPRAZOLE SODIUM 40 MG PO TBEC
40.0000 mg | DELAYED_RELEASE_TABLET | Freq: Every day | ORAL | Status: DC
  Administered 2011-11-27 – 2011-11-28 (×2): 40 mg via ORAL
  Filled 2011-11-27 (×2): qty 1

## 2011-11-27 MED ORDER — RIVAROXABAN 10 MG PO TABS: 20.0000 mg | ORAL_TABLET | Freq: Every day | ORAL | Status: DC

## 2011-11-27 MED ORDER — METOPROLOL TARTRATE 25 MG PO TABS
25.0000 mg | ORAL_TABLET | Freq: Two times a day (BID) | ORAL | Status: DC
  Administered 2011-11-27 – 2011-11-28 (×2): 25 mg via ORAL
  Filled 2011-11-27 (×3): qty 1

## 2011-11-27 MED ORDER — FUROSEMIDE 40 MG PO TABS
40.0000 mg | ORAL_TABLET | ORAL | Status: AC
  Administered 2011-11-27: 40 mg via ORAL
  Filled 2011-11-27: qty 1

## 2011-11-27 MED ORDER — POTASSIUM CHLORIDE CRYS ER 20 MEQ PO TBCR
40.0000 meq | EXTENDED_RELEASE_TABLET | Freq: Two times a day (BID) | ORAL | Status: DC
  Administered 2011-11-27 – 2011-11-28 (×3): 40 meq via ORAL
  Filled 2011-11-27 (×4): qty 2

## 2011-11-27 MED ORDER — ONDANSETRON HCL 4 MG PO TABS: 4.0000 mg | ORAL_TABLET | Freq: Four times a day (QID) | ORAL | Status: DC | PRN

## 2011-11-27 MED ORDER — SERTRALINE HCL 50 MG PO TABS
50.0000 mg | ORAL_TABLET | Freq: Every day | ORAL | Status: DC
  Administered 2011-11-27: 50 mg via ORAL
  Filled 2011-11-27 (×2): qty 1

## 2011-11-27 MED ORDER — PNEUMOCOCCAL VAC POLYVALENT 25 MCG/0.5ML IJ INJ
0.5000 mL | INJECTION | INTRAMUSCULAR | Status: AC
  Administered 2011-11-28: 0.5 mL via INTRAMUSCULAR
  Filled 2011-11-27: qty 0.5

## 2011-11-27 MED ORDER — ONDANSETRON HCL 4 MG/2ML IJ SOLN: 4.0000 mg | Freq: Three times a day (TID) | INTRAMUSCULAR | Status: AC | PRN

## 2011-11-27 MED ORDER — AZITHROMYCIN 250 MG PO TABS
250.0000 mg | ORAL_TABLET | Freq: Every day | ORAL | Status: DC
  Administered 2011-11-27 – 2011-11-28 (×2): 250 mg via ORAL
  Filled 2011-11-27 (×2): qty 1

## 2011-11-27 MED ORDER — MORPHINE SULFATE 2 MG/ML IJ SOLN: 2.0000 mg | INTRAMUSCULAR | Status: DC | PRN

## 2011-11-27 MED ORDER — AMIODARONE HCL 200 MG PO TABS
200.0000 mg | ORAL_TABLET | Freq: Every day | ORAL | Status: DC
  Administered 2011-11-27 – 2011-11-28 (×2): 200 mg via ORAL
  Filled 2011-11-27 (×2): qty 1

## 2011-11-27 MED ORDER — FOLIC ACID 1 MG PO TABS
1000.0000 ug | ORAL_TABLET | Freq: Every day | ORAL | Status: DC
  Administered 2011-11-27 – 2011-11-28 (×2): 1 mg via ORAL
  Filled 2011-11-27 (×2): qty 1

## 2011-11-27 MED ORDER — DILTIAZEM HCL 100 MG IV SOLR
5.0000 mg/h | INTRAVENOUS | Status: DC
  Filled 2011-11-27: qty 100

## 2011-11-27 MED ORDER — SODIUM CHLORIDE 0.9 % IJ SOLN
3.0000 mL | Freq: Two times a day (BID) | INTRAMUSCULAR | Status: DC
  Administered 2011-11-27 – 2011-11-28 (×2): 3 mL via INTRAVENOUS

## 2011-11-27 MED ORDER — ONDANSETRON HCL 4 MG/2ML IJ SOLN: 4.0000 mg | Freq: Four times a day (QID) | INTRAMUSCULAR | Status: DC | PRN

## 2011-11-27 MED ORDER — FUROSEMIDE 80 MG PO TABS
80.0000 mg | ORAL_TABLET | Freq: Every day | ORAL | Status: DC
  Administered 2011-11-28: 80 mg via ORAL
  Filled 2011-11-27: qty 1

## 2011-11-27 MED ORDER — SODIUM CHLORIDE 0.9 % IV SOLN
INTRAVENOUS | Status: DC
  Administered 2011-11-27 (×2): via INTRAVENOUS

## 2011-11-27 MED ORDER — IPRATROPIUM BROMIDE HFA 17 MCG/ACT IN AERS
2.0000 | INHALATION_SPRAY | Freq: Four times a day (QID) | RESPIRATORY_TRACT | Status: DC
  Administered 2011-11-27 (×2): 2 via RESPIRATORY_TRACT
  Filled 2011-11-27: qty 12.9

## 2011-11-27 MED ORDER — METOPROLOL TARTRATE 50 MG PO TABS
50.0000 mg | ORAL_TABLET | Freq: Two times a day (BID) | ORAL | Status: DC
  Administered 2011-11-27: 50 mg via ORAL
  Filled 2011-11-27 (×2): qty 1

## 2011-11-27 MED ORDER — FUROSEMIDE 40 MG PO TABS
40.0000 mg | ORAL_TABLET | Freq: Every day | ORAL | Status: DC
  Administered 2011-11-27: 40 mg via ORAL
  Filled 2011-11-27: qty 1

## 2011-11-27 NOTE — Progress Notes (Signed)
AMBULATED ALONG THE HALLWAY ABOUT 500 FT, PULSE OX- 94 % ON ROOM AIR., NO COMPLAINTS PRESENTED.

## 2011-11-27 NOTE — H&P (Signed)
Ann Kaufman is an 78 y.o. female.   Chief Complaint: Chest pain HPI: 78 year old female with multiple medical problems including atrial fibrillation with rapid ventricular response who was seen at [facility] 2 weeks ago. She just came out of the [facility] a week ago after she had hemopericardium that resulted in aspiration. It was thought that the hemopericardium was caused by trauma from one place to have pacemaker wire. She's been also to before and still after the procedure. She came in today secondary to having chest pain mainly in the epigastric region going to her left side. Denied any radiation, denied any diaphoresis. The pain is worse with movement or deep inspiration. Is also rated as 4/10 at this point. She had mild shortness of breath with exertion. But no fever or cough. No nausea vomiting. Patient's evaluation in the ER showed that she had bilateral pleural effusions left more the right.  Past Medical History  Diagnosis Date  . Pacemaker   . Atrial fibrillation   . Pericardial effusion   . Hypertension   . Depression   . Arthritis     Past Surgical History  Procedure Date  . Pacemaker insertion   . Abdominal hysterectomy   . Breast lumpectomy   . Appendectomy   . Bowel resection     No family history on file. Social History:  reports that she has never smoked. She does not have any smokeless tobacco history on file. She reports that she does not drink alcohol or use illicit drugs.  Allergies: No Known Allergies   (Not in a hospital admission)  Results for orders placed during the hospital encounter of 11/26/11 (from the past 48 hour(s))  CBC     Status: Abnormal   Collection Time   11/26/11  9:26 PM      Component Value Range Comment   WBC 5.7  4.0 - 10.5 (K/uL)    RBC 4.33  3.87 - 5.11 (MIL/uL)    Hemoglobin 11.7 (*) 12.0 - 15.0 (g/dL)    HCT 04.5  40.9 - 81.1 (%)    MCV 83.8  78.0 - 100.0 (fL)    MCH 27.0  26.0 - 34.0 (pg)    MCHC 32.2  30.0 - 36.0  (g/dL)    RDW 91.4 (*) 78.2 - 15.5 (%)    Platelets 233  150 - 400 (K/uL)   DIFFERENTIAL     Status: Abnormal   Collection Time   11/26/11  9:26 PM      Component Value Range Comment   Neutrophils Relative 58  43 - 77 (%)    Neutro Abs 3.3  1.7 - 7.7 (K/uL)    Lymphocytes Relative 26  12 - 46 (%)    Lymphs Abs 1.5  0.7 - 4.0 (K/uL)    Monocytes Relative 14 (*) 3 - 12 (%)    Monocytes Absolute 0.8  0.1 - 1.0 (K/uL)    Eosinophils Relative 3  0 - 5 (%)    Eosinophils Absolute 0.2  0.0 - 0.7 (K/uL)    Basophils Relative 1  0 - 1 (%)    Basophils Absolute 0.0  0.0 - 0.1 (K/uL)   BASIC METABOLIC PANEL     Status: Abnormal   Collection Time   11/26/11  9:26 PM      Component Value Range Comment   Sodium 140  135 - 145 (mEq/L)    Potassium 4.2  3.5 - 5.1 (mEq/L)    Chloride 101  96 -  112 (mEq/L)    CO2 26  19 - 32 (mEq/L)    Glucose, Bld 116 (*) 70 - 99 (mg/dL)    BUN 17  6 - 23 (mg/dL)    Creatinine, Ser 1.61  0.50 - 1.10 (mg/dL)    Calcium 9.5  8.4 - 10.5 (mg/dL)    GFR calc non Af Amer 49 (*) >90 (mL/min)    GFR calc Af Amer 56 (*) >90 (mL/min)   CARDIAC PANEL(CRET KIN+CKTOT+MB+TROPI)     Status: Normal   Collection Time   11/26/11  9:26 PM      Component Value Range Comment   Total CK 81  7 - 177 (U/L)    CK, MB 2.1  0.3 - 4.0 (ng/mL)    Troponin I <0.30  <0.30 (ng/mL)    Relative Index RELATIVE INDEX IS INVALID  0.0 - 2.5    PRO B NATRIURETIC PEPTIDE     Status: Abnormal   Collection Time   11/26/11  9:26 PM      Component Value Range Comment   Pro B Natriuretic peptide (BNP) 3131.0 (*) 0 - 450 (pg/mL)   POCT I-STAT TROPONIN I     Status: Normal   Collection Time   11/26/11  9:40 PM      Component Value Range Comment   Troponin i, poc 0.00  0.00 - 0.08 (ng/mL)    Comment 3            PROTIME-INR     Status: Abnormal   Collection Time   11/26/11 10:02 PM      Component Value Range Comment   Prothrombin Time 30.0 (*) 11.6 - 15.2 (seconds)    INR 2.81 (*) 0.00 - 1.49      D-DIMER, QUANTITATIVE     Status: Abnormal   Collection Time   11/26/11 11:04 PM      Component Value Range Comment   D-Dimer, Quant 3.30 (*) 0.00 - 0.48 (ug/mL-FEU)    Dg Chest 2 View  11/26/2011  *RADIOLOGY REPORT*  Clinical Data: Chest pain and shortness of breath.  CHEST - 2 VIEW  Comparison: No priors.  Findings: There are bilateral pleural effusions (moderate on the left and small on the right).  Associated bibasilar opacities (left greater than right) are favored to represent areas of passive atelectasis, however, underlying air space consolidation (particularly in the left lower lobe) is difficult to exclude. Mild pulmonary venous congestion without frank pulmonary edema. Heart size is borderline enlarged.  Mediastinal contours are unremarkable.  Atherosclerotic calcifications within the arch of the aorta.  There is a left-sided pacemaker device in place with lead tips projecting in the expected location of the right atrial appendage and right ventricular apex.  IMPRESSION: 1.  Moderate left and small right-sided pleural effusions. 2.  Bibasilar opacities (left greater than right), favored to represent areas of passive atelectasis. 3.  Atherosclerosis. 4.  Pacemaker device in place, as above.  Original Report Authenticated By: Florencia Reasons, M.D.   Ct Angio Chest W/cm &/or Wo Cm  11/27/2011  *RADIOLOGY REPORT*  Clinical Data: Chest pain and palpitations.  CT ANGIOGRAPHY CHEST  Technique:  Multidetector CT imaging of the chest using the standard protocol during bolus administration of intravenous contrast. Multiplanar reconstructed images including MIPs were obtained and reviewed to evaluate the vascular anatomy.  Contrast:  100 ml Omnipaque-300.  Comparison: Chest x-ray 11/26/2011.  Findings: A permanent left-sided pacemaker is noted with associated artifact.  The chest wall is otherwise  unremarkable.  The bony thorax is intact.  Moderate degenerative changes involve the thoracic spine.  The  heart is normal in size.  No pericardial effusion. Mild pericardial thickening is noted. The left ventricular pacer lead is in the extreme apex of the right ventricle but is not definitely penetrating the visceral layer of the pericardium.  No mediastinal or hilar lymphadenopathy.  The aorta is normal in caliber. Atherosclerotic calcifications noted at the arch.  The esophagus is grossly normal.  The pulmonary arterial tree is well opacified.  No filling defects to suggest pulmonary emboli.  Examination of the lung parenchyma demonstrates a moderate-sized left pleural effusion and small right pleural effusion.  There is overlying atelectasis, left greater than right.  A small amount of fluid is also noted in the left major fissure.  The upper abdomen is unremarkable.  There is a small enhancing lesion in the right hepatic lobe which is most likely a vascular shunt.  IMPRESSION:  1.  No CT findings for pulmonary embolism. 2.  Moderate sized left pleural effusion and small right pleural effusion with overlying atelectasis. 3.  Small amount of pericardial fluid and mild pericardial thickening but no overt effusion. 4.  Normal thoracic aorta except for atherosclerotic calcifications.  Original Report Authenticated By: P. Loralie Champagne, M.D.    Review of Systems  Constitutional: Negative.   HENT: Negative.   Eyes: Negative.   Respiratory: Positive for shortness of breath. Negative for cough, sputum production and wheezing.   Cardiovascular: Positive for chest pain and palpitations.  Gastrointestinal: Negative.   Genitourinary: Negative.   Musculoskeletal: Negative.   Skin: Negative.   Neurological: Negative.   Endo/Heme/Allergies: Negative.   Psychiatric/Behavioral: Negative.     Blood pressure 130/73, pulse 114, temperature 98.1 F (36.7 C), temperature source Oral, resp. rate 19, SpO2 96.00%. Physical Exam  Constitutional: She is oriented to person, place, and time. She appears well-developed and  well-nourished.  HENT:  Head: Normocephalic and atraumatic.  Right Ear: External ear normal.  Left Ear: External ear normal.  Nose: Nose normal.  Mouth/Throat: Oropharynx is clear and moist.  Eyes: Conjunctivae and EOM are normal. Pupils are equal, round, and reactive to light.  Neck: Normal range of motion. Neck supple.  Cardiovascular: Normal rate, regular rhythm, normal heart sounds and intact distal pulses.   Respiratory: Effort normal and breath sounds normal.  GI: Soft. Bowel sounds are normal.  Musculoskeletal: Normal range of motion.  Neurological: She is alert and oriented to person, place, and time. She has normal reflexes.  Skin: Skin is warm and dry.  Psychiatric: She has a normal mood and affect. Her behavior is normal. Judgment and thought content normal.     Assessment/Plan 78 year old female presenting with left-sided chest pain and moderate size left-sided pleural effusion. Patient is also 2 months so that is worrisome for hemorrhagic pleural effusion. Is also possible that she could have pneumonia with parapneumonic effusion. Heart chest pain could be from the pleural effusion and reminiscent of how pericarditis from the fluid or could be acute coronary syndrome. Plan #1 chest pain: Patient will be admitted to telemetry bed for serial cardiac enzymes checked. Echocardiogram will be repeated to see if she has pericarditis or pericardial fluid. We will address her left-sided pleural effusion also as this could be a cause of her pain. #2 atrial fibrillation with rapid ventricular response: Patient's heart rate is currently in the 130s. We will start on some Cardizem drip. We'll send her to step down unit and  titrate to control her heart rate. #3 pleural effusion: We will get paracentesis done under ultrasound guidance. I'm holding her Xarelto in this setting. #4 hypertension: Continue home medications as needed   Annalena Piatt,LAWAL 11/27/2011, 1:14 AM

## 2011-11-27 NOTE — Progress Notes (Signed)
INITIAL ADULT NUTRITION ASSESSMENT Date: 11/27/2011   Time: 2:06 PM Reason for Assessment: Nutrition Risk- Unintended weight loss  ASSESSMENT: Female 78 y.o.  Dx: Chest pain; atrial fibrillation; Pleural effusion  Past Medical History  Diagnosis Date  . Pacemaker   . Atrial fibrillation   . Pericardial effusion   . Hypertension   . Depression   . Arthritis     Scheduled Meds:    . amiodarone  200 mg Oral Daily  . azithromycin  250 mg Oral Daily  . diltiazem  10 mg Intravenous Once  . folic acid  1,000 mcg Oral Daily  . furosemide  40 mg Oral NOW  . furosemide  80 mg Oral Daily  . ipratropium  2 puff Inhalation Q6H  . methotrexate  15 mg Oral Weekly  . metoprolol  50 mg Oral BID  . pantoprazole  40 mg Oral Daily  . pneumococcal 23 valent vaccine  0.5 mL Intramuscular Tomorrow-1000  . potassium chloride  40 mEq Oral BID  . sertraline  50 mg Oral QHS  . sodium chloride  3 mL Intravenous Q12H  . DISCONTD: furosemide  40 mg Oral Daily  . DISCONTD: rivaroxaban  20 mg Oral Daily   Continuous Infusions:    . DISCONTD: sodium chloride 100 mL/hr at 11/27/11 0555  . DISCONTD: diltiazem (CARDIZEM) infusion 10 mg/hr (11/27/11 0055)  . DISCONTD: diltiazem (CARDIZEM) infusion Stopped (11/27/11 0555)   PRN Meds:.iohexol, morphine injection, ondansetron (ZOFRAN) IV, ondansetron (ZOFRAN) IV, ondansetron   Ht: 5\' 5"  (165.1 cm)  Wt: 154 lb 12.2 oz (70.2 kg)  Ideal Wt: 56.8 kg  % Ideal Wt: 124%  Usual Wt: pt reported weighing 173 lb prior to fluid removal   Body mass index is 25.75 kg/(m^2).  Food/Nutrition Related Hx:  BM today. Pt reports weight loss due to removal of excess fluid. Pt states appetite is great, enjoys cooking, and eating well. Pt also reports receiving low sodium diet education in the past and now reads food labels.   Labs:  CMP     Component Value Date/Time   NA 140 11/27/2011 0313   K 3.3* 11/27/2011 0313   CL 102 11/27/2011 0313   CO2 28 11/27/2011 0313     GLUCOSE 105* 11/27/2011 0313   BUN 15 11/27/2011 0313   CREATININE 0.91 11/27/2011 0313   CALCIUM 9.1 11/27/2011 0313   PROT 6.6 11/27/2011 0313   ALBUMIN 2.9* 11/27/2011 0313   AST 23 11/27/2011 0313   ALT 15 11/27/2011 0313   ALKPHOS 83 11/27/2011 0313   BILITOT 0.4 11/27/2011 0313   GFRNONAA 58* 11/27/2011 0313   GFRAA 68* 11/27/2011 0313     Intake/Output Summary (Last 24 hours) at 11/27/11 1406 Last data filed at 11/27/11 1300  Gross per 24 hour  Intake    600 ml  Output    201 ml  Net    399 ml    Diet Order: Cardiac (100% PO intake documented)  Supplements/Tube Feeding: None at this time.  IVF:     DISCONTD: sodium chloride Last Rate: 100 mL/hr at 11/27/11 0555  DISCONTD: diltiazem (CARDIZEM) infusion Last Rate: 10 mg/hr (11/27/11 0055)  DISCONTD: diltiazem (CARDIZEM) infusion Last Rate: Stopped (11/27/11 0555)    Estimated Nutritional Needs:   Kcal: 1550-1650 Protein: 65-75 gm Fluid: >1.7 L  NUTRITION DIAGNOSIS: No nutrition diagnosis at this time.  MONITORING/EVALUATION(Goals): Goal: Pt to follow low sodium diet at home. Monitor for questions.  EDUCATION NEEDS: -Education needs addressed  INTERVENTION: Geophysical data processor  PO intake as appropriate for diet order. RD available to answer diet related questions should they arise in the future.    DOCUMENTATION CODES Per approved criteria  -Not Applicable    Leonette Most 11/27/2011, 2:06 PM

## 2011-11-27 NOTE — Progress Notes (Signed)
TRANSFERRED TO 2006 BY WHEELCHAIR, STABLE, REPORT GIVEN TO RN, BELONGINGS WITH PT AND DAUGHTER.

## 2011-11-27 NOTE — Progress Notes (Addendum)
[facility]  Interim history: 78 y/o with recent pacer placement on 3/19 at [facility] by DR Parachos. She had a wire change on 3/21 and subsequentially developed a large pericardial effusion. She was sent to [facility] and had this was drained. Notes from [facility] call it an effusion and not a hemopericardium.  She was also recently fluid overloaded and was diuresed aggressively at [facility].  She returns due to palpitations felt last evening with weakness. She was found to have a-fib with RVR and a moderate left pleural effusion. She was recently diagnosed with a respiratory infection and stared on Z-pak and inhalers -she has never used inhalers in the past.  See records from [facility] and [facility] in chart please.   Subjective: Feels much better. No palpitations or chest pain and breathing is back to baseline.   Objective: Blood pressure 132/68, pulse 78, temperature 97.4 F (36.3 C), temperature source Oral, resp. rate 20, height 5\' 5"  (1.651 m), weight 70.2 kg (154 lb 12.2 oz), SpO2 95.00%. Weight change:   Intake/Output Summary (Last 24 hours) at 11/27/11 1601 Last data filed at 11/27/11 1300  Gross per 24 hour  Intake    600 ml  Output    201 ml  Net    399 ml    Physical Exam: Pt examined Decreased breath sounds in left lower- mid lung fields. Heart- RRR  Lab Results:  [facility] 11/27/11 0313 11/26/11 2126  NA 140 140  K 3.3* 4.2  CL 102 101  CO2 28 26  GLUCOSE 105* 116*  BUN 15 17  CREATININE 0.91 1.06  CALCIUM 9.1 9.5  MG -- --  PHOS -- --    Basename 11/27/11 0313  AST 23  ALT 15  ALKPHOS 83  BILITOT 0.4  PROT 6.6  ALBUMIN 2.9*   No results found for this basename: LIPASE:2,AMYLASE:2 in the last 72 hours  Basename 11/27/11 0313 11/26/11 2126  WBC 5.0 5.7  NEUTROABS -- 3.3  HGB 10.7* 11.7*  HCT 33.9* 36.3  MCV 83.3 83.8  PLT 223 233    Basename 11/27/11 0840 11/27/11 0226 11/26/11 2126  CKTOTAL 70 72 81  CKMB 2.3 2.3 2.1  CKMBINDEX -- -- --  TROPONINI <0.30 <0.30  <0.30   No components found with this basename: POCBNP:3  Basename 11/26/11 2304  DDIMER 3.30*   No results found for this basename: HGBA1C:2 in the last 72 hours No results found for this basename: CHOL:2,HDL:2,LDLCALC:2,TRIG:2,CHOLHDL:2,LDLDIRECT:2 in the last 72 hours No results found for this basename: TSH,T4TOTAL,FREET3,T3FREE,THYROIDAB in the last 72 hours No results found for this basename: VITAMINB12:2,FOLATE:2,FERRITIN:2,TIBC:2,IRON:2,RETICCTPCT:2 in the last 72 hours  Micro Results: Recent Results (from the past 240 hour(s))  MRSA PCR SCREENING     Status: Normal   Collection Time   11/27/11  1:37 AM      Component Value Range Status Comment   MRSA by PCR NEGATIVE  NEGATIVE  Final     Studies/Results: Dg Chest 2 View  11/26/2011  *RADIOLOGY REPORT*  Clinical Data: Chest pain and shortness of breath.  CHEST - 2 VIEW  Comparison: No priors.  Findings: There are bilateral pleural effusions (moderate on the left and small on the right).  Associated bibasilar opacities (left greater than right) are favored to represent areas of passive atelectasis, however, underlying air space consolidation (particularly in the left lower lobe) is difficult to exclude. Mild pulmonary venous congestion without frank pulmonary edema. Heart size is borderline enlarged.  Mediastinal contours are unremarkable.  Atherosclerotic calcifications within  the arch of the aorta.  There is a left-sided pacemaker device in place with lead tips projecting in the expected location of the right atrial appendage and right ventricular apex.  IMPRESSION: 1.  Moderate left and small right-sided pleural effusions. 2.  Bibasilar opacities (left greater than right), favored to represent areas of passive atelectasis. 3.  Atherosclerosis. 4.  Pacemaker device in place, as above.  Original Report Authenticated By: Florencia Reasons, M.D.   Ct Angio Chest W/cm &/or Wo Cm  11/27/2011  *RADIOLOGY REPORT*  Clinical Data: Chest pain and  palpitations.  CT ANGIOGRAPHY CHEST  Technique:  Multidetector CT imaging of the chest using the standard protocol during bolus administration of intravenous contrast. Multiplanar reconstructed images including MIPs were obtained and reviewed to evaluate the vascular anatomy.  Contrast:  100 ml Omnipaque-300.  Comparison: Chest x-ray 11/26/2011.  Findings: A permanent left-sided pacemaker is noted with associated artifact.  The chest wall is otherwise unremarkable.  The bony thorax is intact.  Moderate degenerative changes involve the thoracic spine.  The heart is normal in size.  No pericardial effusion. Mild pericardial thickening is noted. The left ventricular pacer lead is in the extreme apex of the right ventricle but is not definitely penetrating the visceral layer of the pericardium.  No mediastinal or hilar lymphadenopathy.  The aorta is normal in caliber. Atherosclerotic calcifications noted at the arch.  The esophagus is grossly normal.  The pulmonary arterial tree is well opacified.  No filling defects to suggest pulmonary emboli.  Examination of the lung parenchyma demonstrates a moderate-sized left pleural effusion and small right pleural effusion.  There is overlying atelectasis, left greater than right.  A small amount of fluid is also noted in the left major fissure.  The upper abdomen is unremarkable.  There is a small enhancing lesion in the right hepatic lobe which is most likely a vascular shunt.  IMPRESSION:  1.  No CT findings for pulmonary embolism. 2.  Moderate sized left pleural effusion and small right pleural effusion with overlying atelectasis. 3.  Small amount of pericardial fluid and mild pericardial thickening but no overt effusion. 4.  Normal thoracic aorta except for atherosclerotic calcifications.  Original Report Authenticated By: P. Loralie Champagne, M.D.    Medications: Scheduled Meds:   . amiodarone  200 mg Oral Daily  . azithromycin  250 mg Oral Daily  . diltiazem  10 mg  Intravenous Once  . folic acid  1,000 mcg Oral Daily  . furosemide  40 mg Oral NOW  . furosemide  80 mg Oral Daily  . ipratropium  2 puff Inhalation Q6H  . methotrexate  15 mg Oral Weekly  . metoprolol  50 mg Oral BID  . pantoprazole  40 mg Oral Daily  . pneumococcal 23 valent vaccine  0.5 mL Intramuscular Tomorrow-1000  . potassium chloride  40 mEq Oral BID  . sertraline  50 mg Oral QHS  . sodium chloride  3 mL Intravenous Q12H  . DISCONTD: furosemide  40 mg Oral Daily  . DISCONTD: rivaroxaban  20 mg Oral Daily   Continuous Infusions:   . DISCONTD: sodium chloride 100 mL/hr at 11/27/11 0555  . DISCONTD: diltiazem (CARDIZEM) infusion 10 mg/hr (11/27/11 0055)  . DISCONTD: diltiazem (CARDIZEM) infusion Stopped (11/27/11 0555)   PRN Meds:.iohexol, morphine injection, ondansetron (ZOFRAN) IV, ondansetron (ZOFRAN) IV, ondansetron  Assessment/Plan: Principal Problem:  *Chest pain Due to a-fib with RVR and now resolved. Cardiac enzymes stable.    Pleural effusion Very  stable currently therefore will hold off on thoracentesis for now. Will resume her Lasix- was on 80 daily for 1 week. Ambulate and watch pulse ox and resp status clinically. Currently on room air.    Atrial fibrillation with RVR Converted to NSR- cont Amiodorone and metoprolol - she was on 25 BID at home but due to incorrect med rec 50 BID has been ordered. Will switch her back to 25 BID.  F.u ECHO  Anticoagulation Xarelto was held on admission as is was suspected that she had a hemopericardium 2 wks ago and may now have a hemothorax.  Will cont to hold in case she needs a thoracentesis for her mod left sided effusion.     HTN (hypertension) Follow- metoprolol being decreased to home dose due to mild hypotension.   Bronchitis? Cont Zithromax- she tells me tomorrow will be day 5- her last day. D/c inhalers as lungs are free of wheeze and congestion.  Code Status: Full code Family Communication: none needed- pt very  oriented to her care Disposition: transfer to telemetry today and f/u respiratory status with diuresis  Prudence Heiny [phone] 11/27/2011, 4:01 PM  LOS: 1 day

## 2011-11-27 NOTE — Care Management Note (Addendum)
    Page 1 of 1   11/28/2011     11:30:42 AM   CARE MANAGEMENT NOTE 11/28/2011  Patient:  Ann Kaufman, Ann Kaufman   Account Number:  [id number]  Date Initiated:  11/27/2011  Documentation initiated by:  Junius Creamer  Subjective/Objective Assessment:   adm w at fib w rvr     Action/Plan:   lives w fam   Anticipated DC Date:     Anticipated DC Plan:        DC Planning Services  CM consult      Choice offered to / List presented to:             Status of service:   Medicare Important Message given?   (If response is "NO", the following Medicare IM given date fields will be blank) Date Medicare IM given:   Date Additional Medicare IM given:    Discharge Disposition:    Per UR Regulation:  Reviewed for med. necessity/level of care/duration of stay  If discussed at Long Length of Stay Meetings, dates discussed:    Comments:  11-28-11 11:30am [name], RNBSN [phone] Lives at home alone - very independent - lives in [place] with other people around her that are close to her.  Has assistance to come in periodically with "heavy stuff" at home.  At this time has not needs.  Hoping to go home today.  6/6 9:03a debbie dowell rn,bsn [phone]

## 2011-11-27 NOTE — Progress Notes (Signed)
  Echocardiogram 2D Echocardiogram has been performed.  Cathie Beams Deneen 11/27/2011, 3:35 PM

## 2011-11-28 DIAGNOSIS — I4891 Unspecified atrial fibrillation: Secondary | ICD-10-CM

## 2011-11-28 DIAGNOSIS — E876 Hypokalemia: Secondary | ICD-10-CM | POA: Diagnosis not present

## 2011-11-28 DIAGNOSIS — R079 Chest pain, unspecified: Secondary | ICD-10-CM

## 2011-11-28 DIAGNOSIS — I10 Essential (primary) hypertension: Secondary | ICD-10-CM

## 2011-11-28 DIAGNOSIS — J90 Pleural effusion, not elsewhere classified: Secondary | ICD-10-CM

## 2011-11-28 MED ORDER — FUROSEMIDE 40 MG PO TABS: 40.0000 mg | ORAL_TABLET | Freq: Every day | ORAL | Status: DC

## 2011-11-28 MED ORDER — POTASSIUM CHLORIDE CRYS ER 20 MEQ PO TBCR: 20.0000 meq | EXTENDED_RELEASE_TABLET | Freq: Every day | ORAL | Status: DC

## 2011-11-28 NOTE — Progress Notes (Signed)
Pt discharging home with family.  All intructions and prescriptions given and reviewed.  [facility] [place] called to [facility] as [facility] [place].  Pt confident she will be able to retrieve them.  Await daughter for transport.

## 2011-11-28 NOTE — Discharge Instructions (Signed)
Atrial Fibrillation  Atrial fibrillation is an abnormal heartbeat (rhythm). It can cause your heart rate to be faster or slower than normal, and can cause clots of blood to form in your heart. These clots can cause other health problems. Atrial fibrillation may be caused by a heart attack, lung problem, or certain medicine. Sometimes the cause of atrial fibrillation is not found.  HOME CARE   Take blood thinning medicine (anticoagulants) as told by your doctor. Your doctor will need to draw your blood to check lab values if you take blood thinners.   If you had a cardioversion, limit your activity as told by your doctor.   Learn how to check your heartbeat (pulse) for an abnormal or irregular beat. Your doctor can show you how.   Ask your doctor if it is okay to exercise.   Only take medicine as told by your doctor.  GET HELP RIGHT AWAY IF:    You have trouble breathing or feel dizzy.   You have puffy (swollen) feet or ankles.   You have blood in your pee (urine) or poop (bowel movement).   You feel your heart "skipping" beats.   You feel your heart "racing" or beating fast.   You have weakness in your arms or legs.   You have trouble talking, seeing, or thinking.   You have chest pain or pain in your arm or jaw.  MAKE SURE YOU:    Understand these instructions.   Will watch your condition.   Will get help right away if you are not doing well or get worse.  Document Released: 03/18/2008 Document Revised: 05/29/2011 Document Reviewed: 09/27/2009  ExitCare Patient Information 2012 [employer].

## 2011-11-28 NOTE — Discharge Summary (Signed)
DISCHARGE SUMMARY  Ann Kaufman  MR#: [id number]  DOB:[birth year]  Date of Admission: 11/26/2011 Date of Discharge: 11/28/2011  Attending Physician:Corena Tilson K  Patient's WUJ:WJXBJYN,WGNFA, MD, MD  Consults: -None  Discharge Diagnoses: Present on Admission:  .Heart palpitations .Pleural effusion .Atrial fibrillation with RVR .HTN (hypertension)   Initial presentation: Patient is a 78 year old white female with past medical history of atrial fibrillation, recent pacemaker wire replacement and secondary hemopericardium status post drainage who presented to the emergency room on the evening of 6/5 with complaints of palpitations and chest discomfort along with mild shortness of breath. Patient was found to be in rapid ventricular rate of atrial fibrillation plus she had a large pleural effusion. Admitted to the hospitalist service and placed in the step down unit for further monitoring, evaluation and treatment.  Hospital Course: Patient Active Problem List  Diagnoses  . Heart palpitations: Felt to be secondary to her atrial fibrillation. Since resolved once she is back in normal sinus rhythm.   . Pleural effusion: This was stable. Holding off on thoracentesis. Resuming her home dose of Lasix which was temporarily increased to 80 mg by mouth daily and then back down to 40 after that. Have also added home dose potassium at 20 mg once by mouth daily for replacement.   . Atrial fibrillation with RVR: Patient was put on rate control medication plus amiodarone. An echocardiogram was done which noted no abnormalities other than mild right atrial enlargement. She converted back to normal sinus rhythm on 6/6 and has remained there since.   Marland Kitchen HTN (hypertension): Stable. She had some mild hypotension and there was a question as to her dose of metoprolol which was clarified at 25 by mouth twice a day and has since been decreased back to her home dose.   Marland Kitchen Hypokalemia: Replaced.      Medication List  As of 11/28/2011  1:08 PM   TAKE these medications         amiodarone 200 MG tablet   Commonly known as: PACERONE   Take 200 mg by mouth daily.      azithromycin 250 MG tablet   Commonly known as: ZITHROMAX   Take 250 mg by mouth daily. On day 4 of her zpack      esomeprazole 40 MG capsule   Commonly known as: NEXIUM   Take 40 mg by mouth daily before breakfast.      folic acid 400 MCG tablet   Commonly known as: FOLVITE   Take 400 mcg by mouth daily.      furosemide 40 MG tablet   Commonly known as: LASIX   Take 1 tablet (40 mg total) by mouth daily. Continue as planned (80 mg this week only)      ipratropium 17 MCG/ACT inhaler   Commonly known as: ATROVENT HFA   Inhale 2 puffs into the lungs every 6 (six) hours.      methotrexate 2.5 MG tablet   Commonly known as: RHEUMATREX   Take 15 mg by mouth once a week. Caution:Chemotherapy. Protect from light.      metoprolol tartrate 25 MG tablet   Commonly known as: LOPRESSOR   Take 25 mg by mouth 2 (two) times daily.      potassium chloride SA 20 MEQ tablet   Commonly known as: K-DUR,KLOR-CON   Take 1 tablet (20 mEq total) by mouth daily.      rivaroxaban 10 MG Tabs tablet   Commonly known as: XARELTO   Take 20 mg  by mouth daily.      sertraline 50 MG tablet   Commonly known as: ZOLOFT   Take 50 mg by mouth at bedtime.             Day of Discharge BP 120/73  Pulse 56  Temp(Src) 97.8 F (36.6 C) (Oral)  Resp 18  Ht 5\' 5"  (1.651 m)  Wt 70.2 kg (154 lb 12.2 oz)  BMI 25.75 kg/m2  SpO2 93%  Physical Exam: General: Alert and oriented x3, no acute distress, no complaints HEENT: Normocephalic, atraumatic, mucous members are moist Cardiovascular: Currently normal sinus rhythm with regular rate and rhythm, S1-S2 Lungs: Clear to auscultation bilaterally Abdomen: Soft, nontender, nondistended, positive bowel sounds Extremities: No clubbing or cyanosis or edema  Results for orders placed  during the hospital encounter of 11/26/11 (from the past 24 hour(s))  CARDIAC PANEL(CRET KIN+CKTOT+MB+TROPI)     Status: Normal   Collection Time   11/27/11  5:08 PM      Component Value Range   Total CK 79  7 - 177 (U/L)   CK, MB 2.1  0.3 - 4.0 (ng/mL)   Troponin I <0.30  <0.30 (ng/mL)   Relative Index RELATIVE INDEX IS INVALID  0.0 - 2.5     Disposition: improved, being discharged home   Follow-up Appts: Discharge Orders    Future Orders Please Complete By Expires   Diet - low sodium heart healthy      Increase activity slowly         Follow-up Information    Follow up with MALONEY,NANCY, MD. Schedule an appointment as soon as possible for a visit in 1 month.   Contact information:   [address] [place] [address] [phone]       Follow up with Marcina Millard, MD. Schedule an appointment as soon as possible for a visit in 2 weeks.   Contact information:   [address] [place] [address] [phone]          Tests Needing Follow-up: none  Time spent in discharge (includes decision making & examination of pt): 35 minutes  Signed: Hollice Espy 11/28/2011, 1:08 PM

## 2011-12-02 DIAGNOSIS — R42 Dizziness and giddiness: Secondary | ICD-10-CM | POA: Diagnosis not present

## 2011-12-02 DIAGNOSIS — I495 Sick sinus syndrome: Secondary | ICD-10-CM | POA: Diagnosis not present

## 2011-12-02 DIAGNOSIS — I4891 Unspecified atrial fibrillation: Secondary | ICD-10-CM | POA: Diagnosis not present

## 2011-12-02 DIAGNOSIS — I10 Essential (primary) hypertension: Secondary | ICD-10-CM | POA: Diagnosis not present

## 2011-12-03 DIAGNOSIS — E559 Vitamin D deficiency, unspecified: Secondary | ICD-10-CM | POA: Diagnosis not present

## 2011-12-03 DIAGNOSIS — I4891 Unspecified atrial fibrillation: Secondary | ICD-10-CM | POA: Diagnosis not present

## 2011-12-03 DIAGNOSIS — I10 Essential (primary) hypertension: Secondary | ICD-10-CM | POA: Diagnosis not present

## 2011-12-03 DIAGNOSIS — F329 Major depressive disorder, single episode, unspecified: Secondary | ICD-10-CM | POA: Diagnosis not present

## 2011-12-11 DIAGNOSIS — F329 Major depressive disorder, single episode, unspecified: Secondary | ICD-10-CM | POA: Diagnosis not present

## 2011-12-11 DIAGNOSIS — R319 Hematuria, unspecified: Secondary | ICD-10-CM | POA: Diagnosis not present

## 2011-12-11 DIAGNOSIS — I10 Essential (primary) hypertension: Secondary | ICD-10-CM | POA: Diagnosis not present

## 2011-12-11 DIAGNOSIS — E559 Vitamin D deficiency, unspecified: Secondary | ICD-10-CM | POA: Diagnosis not present

## 2011-12-11 DIAGNOSIS — Z0000 Encounter for general adult medical examination without abnormal findings: Secondary | ICD-10-CM | POA: Diagnosis not present

## 2011-12-23 ENCOUNTER — Ambulatory Visit: Payer: Self-pay | Admitting: Family Medicine

## 2011-12-23 DIAGNOSIS — I4891 Unspecified atrial fibrillation: Secondary | ICD-10-CM | POA: Diagnosis not present

## 2011-12-23 DIAGNOSIS — R42 Dizziness and giddiness: Secondary | ICD-10-CM | POA: Diagnosis not present

## 2011-12-23 DIAGNOSIS — M810 Age-related osteoporosis without current pathological fracture: Secondary | ICD-10-CM | POA: Diagnosis not present

## 2011-12-23 DIAGNOSIS — I10 Essential (primary) hypertension: Secondary | ICD-10-CM | POA: Diagnosis not present

## 2011-12-23 DIAGNOSIS — M949 Disorder of cartilage, unspecified: Secondary | ICD-10-CM | POA: Diagnosis not present

## 2011-12-23 DIAGNOSIS — M899 Disorder of bone, unspecified: Secondary | ICD-10-CM | POA: Diagnosis not present

## 2011-12-23 DIAGNOSIS — I495 Sick sinus syndrome: Secondary | ICD-10-CM | POA: Diagnosis not present

## 2011-12-31 DIAGNOSIS — I10 Essential (primary) hypertension: Secondary | ICD-10-CM | POA: Diagnosis not present

## 2011-12-31 DIAGNOSIS — I059 Rheumatic mitral valve disease, unspecified: Secondary | ICD-10-CM | POA: Diagnosis not present

## 2011-12-31 DIAGNOSIS — I4891 Unspecified atrial fibrillation: Secondary | ICD-10-CM | POA: Diagnosis not present

## 2011-12-31 DIAGNOSIS — I495 Sick sinus syndrome: Secondary | ICD-10-CM | POA: Diagnosis not present

## 2012-01-27 DIAGNOSIS — I495 Sick sinus syndrome: Secondary | ICD-10-CM | POA: Diagnosis not present

## 2012-01-27 DIAGNOSIS — I4891 Unspecified atrial fibrillation: Secondary | ICD-10-CM | POA: Diagnosis not present

## 2012-01-27 DIAGNOSIS — I10 Essential (primary) hypertension: Secondary | ICD-10-CM | POA: Diagnosis not present

## 2012-02-19 ENCOUNTER — Ambulatory Visit: Payer: Self-pay | Admitting: Family Medicine

## 2012-02-19 DIAGNOSIS — I495 Sick sinus syndrome: Secondary | ICD-10-CM | POA: Diagnosis not present

## 2012-02-19 DIAGNOSIS — S43499A Other sprain of unspecified shoulder joint, initial encounter: Secondary | ICD-10-CM | POA: Diagnosis not present

## 2012-02-19 DIAGNOSIS — M25519 Pain in unspecified shoulder: Secondary | ICD-10-CM | POA: Diagnosis not present

## 2012-02-19 DIAGNOSIS — I10 Essential (primary) hypertension: Secondary | ICD-10-CM | POA: Diagnosis not present

## 2012-02-19 DIAGNOSIS — S43109A Unspecified dislocation of unspecified acromioclavicular joint, initial encounter: Secondary | ICD-10-CM | POA: Diagnosis not present

## 2012-02-19 DIAGNOSIS — M19019 Primary osteoarthritis, unspecified shoulder: Secondary | ICD-10-CM | POA: Diagnosis not present

## 2012-02-19 DIAGNOSIS — E559 Vitamin D deficiency, unspecified: Secondary | ICD-10-CM | POA: Diagnosis not present

## 2012-02-20 DIAGNOSIS — H04129 Dry eye syndrome of unspecified lacrimal gland: Secondary | ICD-10-CM | POA: Diagnosis not present

## 2012-03-05 DIAGNOSIS — I10 Essential (primary) hypertension: Secondary | ICD-10-CM | POA: Diagnosis not present

## 2012-03-05 DIAGNOSIS — S43109A Unspecified dislocation of unspecified acromioclavicular joint, initial encounter: Secondary | ICD-10-CM | POA: Diagnosis not present

## 2012-03-05 DIAGNOSIS — I4891 Unspecified atrial fibrillation: Secondary | ICD-10-CM | POA: Diagnosis not present

## 2012-03-05 DIAGNOSIS — E559 Vitamin D deficiency, unspecified: Secondary | ICD-10-CM | POA: Diagnosis not present

## 2012-04-01 DIAGNOSIS — M19019 Primary osteoarthritis, unspecified shoulder: Secondary | ICD-10-CM | POA: Diagnosis not present

## 2012-04-01 DIAGNOSIS — M67919 Unspecified disorder of synovium and tendon, unspecified shoulder: Secondary | ICD-10-CM | POA: Diagnosis not present

## 2012-04-01 DIAGNOSIS — I10 Essential (primary) hypertension: Secondary | ICD-10-CM | POA: Diagnosis not present

## 2012-04-01 DIAGNOSIS — M719 Bursopathy, unspecified: Secondary | ICD-10-CM | POA: Diagnosis not present

## 2012-04-02 DIAGNOSIS — E7800 Pure hypercholesterolemia, unspecified: Secondary | ICD-10-CM | POA: Diagnosis not present

## 2012-04-23 DIAGNOSIS — I10 Essential (primary) hypertension: Secondary | ICD-10-CM | POA: Diagnosis not present

## 2012-04-23 DIAGNOSIS — M7530 Calcific tendinitis of unspecified shoulder: Secondary | ICD-10-CM | POA: Diagnosis not present

## 2012-06-17 DIAGNOSIS — I10 Essential (primary) hypertension: Secondary | ICD-10-CM | POA: Diagnosis not present

## 2012-06-17 DIAGNOSIS — I495 Sick sinus syndrome: Secondary | ICD-10-CM | POA: Diagnosis not present

## 2012-06-17 DIAGNOSIS — R0609 Other forms of dyspnea: Secondary | ICD-10-CM | POA: Diagnosis not present

## 2012-06-17 DIAGNOSIS — I4891 Unspecified atrial fibrillation: Secondary | ICD-10-CM | POA: Diagnosis not present

## 2012-08-19 DIAGNOSIS — I495 Sick sinus syndrome: Secondary | ICD-10-CM | POA: Diagnosis not present

## 2012-08-19 DIAGNOSIS — J310 Chronic rhinitis: Secondary | ICD-10-CM | POA: Diagnosis not present

## 2012-09-08 DIAGNOSIS — E7800 Pure hypercholesterolemia, unspecified: Secondary | ICD-10-CM | POA: Diagnosis not present

## 2012-09-15 DIAGNOSIS — E7800 Pure hypercholesterolemia, unspecified: Secondary | ICD-10-CM | POA: Diagnosis not present

## 2012-09-23 DIAGNOSIS — M19019 Primary osteoarthritis, unspecified shoulder: Secondary | ICD-10-CM | POA: Diagnosis not present

## 2012-09-23 DIAGNOSIS — M719 Bursopathy, unspecified: Secondary | ICD-10-CM | POA: Diagnosis not present

## 2012-09-23 DIAGNOSIS — M67919 Unspecified disorder of synovium and tendon, unspecified shoulder: Secondary | ICD-10-CM | POA: Diagnosis not present

## 2012-09-29 DIAGNOSIS — L738 Other specified follicular disorders: Secondary | ICD-10-CM | POA: Diagnosis not present

## 2012-09-29 DIAGNOSIS — L408 Other psoriasis: Secondary | ICD-10-CM | POA: Diagnosis not present

## 2012-09-29 DIAGNOSIS — I8310 Varicose veins of unspecified lower extremity with inflammation: Secondary | ICD-10-CM | POA: Diagnosis not present

## 2012-10-20 DIAGNOSIS — I319 Disease of pericardium, unspecified: Secondary | ICD-10-CM | POA: Diagnosis not present

## 2012-10-20 DIAGNOSIS — I495 Sick sinus syndrome: Secondary | ICD-10-CM | POA: Diagnosis not present

## 2012-10-20 DIAGNOSIS — R0609 Other forms of dyspnea: Secondary | ICD-10-CM | POA: Diagnosis not present

## 2012-10-20 DIAGNOSIS — I4891 Unspecified atrial fibrillation: Secondary | ICD-10-CM | POA: Diagnosis not present

## 2012-10-27 ENCOUNTER — Ambulatory Visit: Payer: Self-pay | Admitting: Family Medicine

## 2012-10-27 DIAGNOSIS — R0602 Shortness of breath: Secondary | ICD-10-CM | POA: Diagnosis not present

## 2012-10-27 DIAGNOSIS — E559 Vitamin D deficiency, unspecified: Secondary | ICD-10-CM | POA: Diagnosis not present

## 2012-10-27 DIAGNOSIS — K219 Gastro-esophageal reflux disease without esophagitis: Secondary | ICD-10-CM | POA: Diagnosis not present

## 2012-10-27 DIAGNOSIS — I059 Rheumatic mitral valve disease, unspecified: Secondary | ICD-10-CM | POA: Diagnosis not present

## 2012-11-16 DIAGNOSIS — R0602 Shortness of breath: Secondary | ICD-10-CM | POA: Diagnosis not present

## 2012-11-16 DIAGNOSIS — M19019 Primary osteoarthritis, unspecified shoulder: Secondary | ICD-10-CM | POA: Diagnosis not present

## 2012-11-19 DIAGNOSIS — K219 Gastro-esophageal reflux disease without esophagitis: Secondary | ICD-10-CM | POA: Diagnosis not present

## 2012-11-19 DIAGNOSIS — R0602 Shortness of breath: Secondary | ICD-10-CM | POA: Diagnosis not present

## 2012-11-19 DIAGNOSIS — E559 Vitamin D deficiency, unspecified: Secondary | ICD-10-CM | POA: Diagnosis not present

## 2012-11-19 DIAGNOSIS — R350 Frequency of micturition: Secondary | ICD-10-CM | POA: Diagnosis not present

## 2013-01-10 DIAGNOSIS — R350 Frequency of micturition: Secondary | ICD-10-CM | POA: Diagnosis not present

## 2013-01-13 ENCOUNTER — Emergency Department: Payer: Self-pay | Admitting: Emergency Medicine

## 2013-01-13 DIAGNOSIS — R6889 Other general symptoms and signs: Secondary | ICD-10-CM | POA: Diagnosis not present

## 2013-01-13 DIAGNOSIS — I10 Essential (primary) hypertension: Secondary | ICD-10-CM | POA: Diagnosis not present

## 2013-01-13 DIAGNOSIS — R112 Nausea with vomiting, unspecified: Secondary | ICD-10-CM | POA: Diagnosis not present

## 2013-01-13 DIAGNOSIS — R5383 Other fatigue: Secondary | ICD-10-CM | POA: Diagnosis not present

## 2013-01-13 DIAGNOSIS — R079 Chest pain, unspecified: Secondary | ICD-10-CM | POA: Diagnosis not present

## 2013-01-13 DIAGNOSIS — R072 Precordial pain: Secondary | ICD-10-CM | POA: Diagnosis not present

## 2013-01-13 DIAGNOSIS — Z79899 Other long term (current) drug therapy: Secondary | ICD-10-CM | POA: Diagnosis not present

## 2013-01-13 DIAGNOSIS — Z7982 Long term (current) use of aspirin: Secondary | ICD-10-CM | POA: Diagnosis not present

## 2013-01-13 LAB — COMPREHENSIVE METABOLIC PANEL
Alkaline Phosphatase: 88 U/L (ref 50–136)
BUN: 21 mg/dL — ABNORMAL HIGH (ref 7–18)
Chloride: 105 mmol/L (ref 98–107)
Co2: 30 mmol/L (ref 21–32)
Creatinine: 1.36 mg/dL — ABNORMAL HIGH (ref 0.60–1.30)
EGFR (African American): 42 — ABNORMAL LOW
EGFR (Non-African Amer.): 37 — ABNORMAL LOW
Glucose: 122 mg/dL — ABNORMAL HIGH (ref 65–99)
Osmolality: 286 (ref 275–301)
SGPT (ALT): 25 U/L (ref 12–78)
Total Protein: 6.9 g/dL (ref 6.4–8.2)

## 2013-01-13 LAB — URINALYSIS, COMPLETE
Bacteria: NONE SEEN
Ketone: NEGATIVE
Protein: NEGATIVE
Specific Gravity: 1.015 (ref 1.003–1.030)
Squamous Epithelial: 1

## 2013-01-13 LAB — CBC
HCT: 36.9 % (ref 35.0–47.0)
HGB: 12.3 g/dL (ref 12.0–16.0)
MCH: 27.6 pg (ref 26.0–34.0)

## 2013-01-13 LAB — CK TOTAL AND CKMB (NOT AT ARMC)
CK, Total: 105 U/L (ref 21–215)
CK-MB: 1 ng/mL (ref 0.5–3.6)

## 2013-01-14 DIAGNOSIS — R5383 Other fatigue: Secondary | ICD-10-CM | POA: Diagnosis not present

## 2013-01-20 DIAGNOSIS — E559 Vitamin D deficiency, unspecified: Secondary | ICD-10-CM | POA: Diagnosis not present

## 2013-01-20 DIAGNOSIS — E162 Hypoglycemia, unspecified: Secondary | ICD-10-CM | POA: Diagnosis not present

## 2013-01-20 DIAGNOSIS — R4182 Altered mental status, unspecified: Secondary | ICD-10-CM | POA: Diagnosis not present

## 2013-01-20 DIAGNOSIS — N390 Urinary tract infection, site not specified: Secondary | ICD-10-CM | POA: Diagnosis not present

## 2013-01-28 DIAGNOSIS — E559 Vitamin D deficiency, unspecified: Secondary | ICD-10-CM | POA: Diagnosis not present

## 2013-01-28 DIAGNOSIS — E162 Hypoglycemia, unspecified: Secondary | ICD-10-CM | POA: Diagnosis not present

## 2013-01-28 DIAGNOSIS — R4182 Altered mental status, unspecified: Secondary | ICD-10-CM | POA: Diagnosis not present

## 2013-01-28 DIAGNOSIS — N390 Urinary tract infection, site not specified: Secondary | ICD-10-CM | POA: Diagnosis not present

## 2013-02-10 DIAGNOSIS — I495 Sick sinus syndrome: Secondary | ICD-10-CM | POA: Diagnosis not present

## 2013-02-12 DIAGNOSIS — E559 Vitamin D deficiency, unspecified: Secondary | ICD-10-CM | POA: Diagnosis not present

## 2013-02-12 DIAGNOSIS — E162 Hypoglycemia, unspecified: Secondary | ICD-10-CM | POA: Diagnosis not present

## 2013-02-12 DIAGNOSIS — N390 Urinary tract infection, site not specified: Secondary | ICD-10-CM | POA: Diagnosis not present

## 2013-02-12 DIAGNOSIS — R4182 Altered mental status, unspecified: Secondary | ICD-10-CM | POA: Diagnosis not present

## 2013-03-23 DIAGNOSIS — R609 Edema, unspecified: Secondary | ICD-10-CM | POA: Diagnosis not present

## 2013-03-23 DIAGNOSIS — R4182 Altered mental status, unspecified: Secondary | ICD-10-CM | POA: Diagnosis not present

## 2013-03-23 DIAGNOSIS — E559 Vitamin D deficiency, unspecified: Secondary | ICD-10-CM | POA: Diagnosis not present

## 2013-03-23 DIAGNOSIS — N390 Urinary tract infection, site not specified: Secondary | ICD-10-CM | POA: Diagnosis not present

## 2013-03-25 DIAGNOSIS — I10 Essential (primary) hypertension: Secondary | ICD-10-CM | POA: Diagnosis not present

## 2013-03-25 DIAGNOSIS — E559 Vitamin D deficiency, unspecified: Secondary | ICD-10-CM | POA: Diagnosis not present

## 2013-03-25 DIAGNOSIS — I4891 Unspecified atrial fibrillation: Secondary | ICD-10-CM | POA: Diagnosis not present

## 2013-03-25 DIAGNOSIS — R609 Edema, unspecified: Secondary | ICD-10-CM | POA: Diagnosis not present

## 2013-04-06 ENCOUNTER — Ambulatory Visit (INDEPENDENT_AMBULATORY_CARE_PROVIDER_SITE_OTHER): Payer: Medicare Other | Admitting: Internal Medicine

## 2013-04-06 ENCOUNTER — Encounter: Payer: Self-pay | Admitting: Internal Medicine

## 2013-04-06 VITALS — BP 160/86 | HR 80 | Ht 65.0 in | Wt 164.2 lb

## 2013-04-06 DIAGNOSIS — R0609 Other forms of dyspnea: Secondary | ICD-10-CM

## 2013-04-06 DIAGNOSIS — I482 Chronic atrial fibrillation, unspecified: Secondary | ICD-10-CM | POA: Insufficient documentation

## 2013-04-06 DIAGNOSIS — J90 Pleural effusion, not elsewhere classified: Secondary | ICD-10-CM | POA: Diagnosis not present

## 2013-04-06 DIAGNOSIS — Z23 Encounter for immunization: Secondary | ICD-10-CM | POA: Diagnosis not present

## 2013-04-06 DIAGNOSIS — I4891 Unspecified atrial fibrillation: Secondary | ICD-10-CM

## 2013-04-06 DIAGNOSIS — I700 Atherosclerosis of aorta: Secondary | ICD-10-CM | POA: Insufficient documentation

## 2013-04-06 NOTE — Assessment & Plan Note (Signed)
Hx unclear. Sounds as if she had component of CHF to cause bilateral effusions. May have had complication of pacemaker placement.  Plan CXR

## 2013-04-06 NOTE — Patient Instructions (Addendum)
Order schedule PFT and 6 MWT   Dx Dyspnea on Exertion  Recommend flu vax  CXR on next visit for our system

## 2013-04-06 NOTE — Progress Notes (Signed)
04/06/13- 21 yoF former smoker -Self referral-SOB w/ exertion x1 year   Here w/ friend Dustin Folks PCP Dr Elease Hashimoto [place], Card Dr Cassie Freer [place]- SOB x 1 year, esp DOE 1/2 block level, worse on hills/steps or when first lying down or bending over, also weather.. No wheeze, cough only w/ colds. 2 pillows. No help several inhalers, incl Symbicort, ipratropium. She relates onset to hosp [facility] 2013 for L>R pleural effusion and pericardial effusion associated w/ AFib complicating known PAD/ Raynaud's in fingers. Sounds as if she had a pericardial drain. Pacemaker, amiodarone.   Now occ twinge L lateral rib cage.  Denies hx pneumonia, asthma or other specific lung disease. Quit cigs 1960's Complicating: psoriasis, Seasonal hayfever.  I recommended flu vax- she is debating with friend. CT chest 6/ 5 /13 IMPRESSION:  1. No CT findings for pulmonary embolism.  2. Moderate sized left pleural effusion and small right pleural  effusion with overlying atelectasis.  3. Small amount of pericardial fluid and mild pericardial  thickening but no overt effusion.  4. Normal thoracic aorta except for atherosclerotic  calcifications.  Original Report Authenticated By: P. Loralie Champagne, M.D.  Prior to Admission medications   Medication Sig Start Date End Date Taking? Authorizing Provider  amiodarone (PACERONE) 200 MG tablet Take 200 mg by mouth daily.   Yes Historical Provider, MD  esomeprazole (NEXIUM) 40 MG capsule Take 40 mg by mouth daily before breakfast.   Yes Historical Provider, MD  folic acid (FOLVITE) 400 MCG tablet Take 400 mcg by mouth daily.   Yes Historical Provider, MD  furosemide (LASIX) 40 MG tablet Take 1 tablet (40 mg total) by mouth daily. Continue as planned (80 mg this week only) 11/28/11  Yes Hollice Espy, MD  metoprolol tartrate (LOPRESSOR) 25 MG tablet Take 25 mg by mouth 2 (two) times daily.   Yes Historical Provider, MD  Misc Natural Products (LAXATIVE FORMULA) TABS Take  2 tablets by mouth daily.   Yes Historical Provider, MD  Nutritional Supplements (JUICE PLUS FIBRE PO) Take 1 tablet by mouth daily.   Yes Historical Provider, MD  potassium chloride SA (K-DUR,KLOR-CON) 20 MEQ tablet Take 1 tablet (20 mEq total) by mouth daily. 11/28/11  Yes Hollice Espy, MD  rivaroxaban (XARELTO) 10 MG TABS tablet Take 20 mg by mouth daily.   Yes Historical Provider, MD  sertraline (ZOLOFT) 50 MG tablet Take 50 mg by mouth at bedtime.   Yes Historical Provider, MD  vitamin B-12 (CYANOCOBALAMIN) 1000 MCG tablet Take 1,000 mcg by mouth daily.   Yes Historical Provider, MD  ipratropium (ATROVENT HFA) 17 MCG/ACT inhaler Inhale 2 puffs into the lungs every 6 (six) hours.    Historical Provider, MD  methotrexate (RHEUMATREX) 2.5 MG tablet Take 15 mg by mouth once a week. Caution:Chemotherapy. Protect from light.    Historical Provider, MD   Past Medical History  Diagnosis Date  . Pacemaker   . Atrial fibrillation   . Pericardial effusion   . Hypertension   . Depression   . Arthritis    Past Surgical History  Procedure Laterality Date  . Pacemaker insertion    . Abdominal hysterectomy    . Breast lumpectomy    . Appendectomy    . Bowel resection    History reviewed. No pertinent family history.  ROS-see HPI Constitutional:   No-   weight loss, night sweats, fevers, chills, fatigue, lassitude. HEENT:   +headaches, difficulty swallowing, tooth/dental problems, +sore throat,  No-  sneezing, itching, ear ache, +nasal congestion, post nasal drip,  CV:  +chest pain, +orthopnea, PND, swelling in lower extremities, anasarca,  dizziness, +palpitations. +Raynauds                  esp R index finger. Resp: +shortness of breath with exertion or at rest.              No-   productive cough,  N+ non-productive cough,  No- coughing up of blood.              No-   change in color of mucus.  No- wheezing.   Skin: No-   rash or lesions. GI:  +heartburn, indigestion, abdominal  pain, nausea, vomiting, diarrhea,                 change in bowel habits, loss of appetite GU: No-   dysuria, change in color of urine, no urgency or frequency.  No- flank pain. MS:  +  joint pain or swelling.  No- decreased range of motion.  No- back pain. Neuro-     nothing unusual Psych:  No- change in mood or affect. + depression or anxiety.  No memory loss.  OBJ- Physical Exam General- Alert, Oriented, Affect-appropriate, Distress- none acute Skin- rash-none, lesions- none, excoriation- none Lymphadenopathy- none Head- atraumatic            Eyes- Gross vision intact, PERRLA, conjunctivae and secretions clear            Ears- Hearing, canals-normal            Nose- Clear, no-Septal dev, mucus, polyps, erosion, perforation             Throat- Mallampati II , mucosa clear , drainage- none, tonsils- atrophic Neck- flexible , trachea midline, no stridor , thyroid nl, carotid no bruit Chest - symmetrical excursion , unlabored           Heart/CV- RRR , no murmur , no gallop  , no rub, nl s1 s2                           - JVD- none , edema- none, stasis changes- none, varices- none           Lung- + trace crackles L base, wheeze- none, cough- none , dullness-none, rub- none           Chest wall- +Pacemaker L Abd- tender-no, distended-no, bowel sounds-present, HSM- no Br/ Gen/ Rectal- Not done, not indicated Extrem- cyanosis- none, clubbing, none, atrophy- none, strength- nl Neuro- grossly intact to observation

## 2013-04-06 NOTE — Assessment & Plan Note (Signed)
Pacemaker, amiodarone. Feels regular now/ paced. Managed by cardiologist

## 2013-04-19 ENCOUNTER — Ambulatory Visit (INDEPENDENT_AMBULATORY_CARE_PROVIDER_SITE_OTHER): Payer: Medicare Other | Admitting: Internal Medicine

## 2013-04-19 DIAGNOSIS — R0609 Other forms of dyspnea: Secondary | ICD-10-CM

## 2013-04-19 LAB — PULMONARY FUNCTION TEST

## 2013-04-19 NOTE — Progress Notes (Signed)
PFT done today. 

## 2013-04-19 NOTE — Progress Notes (Signed)
04/19/13- Documentation for 6 Minute Walk Test

## 2013-04-20 ENCOUNTER — Other Ambulatory Visit: Payer: Self-pay | Admitting: Internal Medicine

## 2013-04-20 DIAGNOSIS — I10 Essential (primary) hypertension: Secondary | ICD-10-CM | POA: Diagnosis not present

## 2013-04-20 DIAGNOSIS — I4891 Unspecified atrial fibrillation: Secondary | ICD-10-CM | POA: Diagnosis not present

## 2013-04-20 DIAGNOSIS — I495 Sick sinus syndrome: Secondary | ICD-10-CM | POA: Diagnosis not present

## 2013-05-02 ENCOUNTER — Encounter: Payer: Self-pay | Admitting: Internal Medicine

## 2013-05-05 DIAGNOSIS — H0019 Chalazion unspecified eye, unspecified eyelid: Secondary | ICD-10-CM | POA: Diagnosis not present

## 2013-05-18 ENCOUNTER — Emergency Department (HOSPITAL_COMMUNITY): Payer: Medicare Other

## 2013-05-18 ENCOUNTER — Encounter (HOSPITAL_COMMUNITY): Payer: Self-pay | Admitting: Radiology

## 2013-05-18 ENCOUNTER — Inpatient Hospital Stay (HOSPITAL_COMMUNITY)
Admission: EM | Admit: 2013-05-18 | Discharge: 2013-05-24 | DRG: 064 | Disposition: A | Discharge status: Rehabilitation Facility | Payer: Medicare Other | Attending: Internal Medicine | Admitting: Internal Medicine

## 2013-05-18 DIAGNOSIS — F329 Major depressive disorder, single episode, unspecified: Secondary | ICD-10-CM | POA: Diagnosis present

## 2013-05-18 DIAGNOSIS — J9600 Acute respiratory failure, unspecified whether with hypoxia or hypercapnia: Secondary | ICD-10-CM | POA: Diagnosis present

## 2013-05-18 DIAGNOSIS — J811 Chronic pulmonary edema: Secondary | ICD-10-CM | POA: Diagnosis present

## 2013-05-18 DIAGNOSIS — J690 Pneumonitis due to inhalation of food and vomit: Secondary | ICD-10-CM | POA: Diagnosis present

## 2013-05-18 DIAGNOSIS — I619 Nontraumatic intracerebral hemorrhage, unspecified: Principal | ICD-10-CM | POA: Diagnosis present

## 2013-05-18 DIAGNOSIS — I4891 Unspecified atrial fibrillation: Secondary | ICD-10-CM | POA: Diagnosis present

## 2013-05-18 DIAGNOSIS — I10 Essential (primary) hypertension: Secondary | ICD-10-CM | POA: Diagnosis not present

## 2013-05-18 DIAGNOSIS — S0003XA Contusion of scalp, initial encounter: Secondary | ICD-10-CM | POA: Diagnosis not present

## 2013-05-18 DIAGNOSIS — Z87891 Personal history of nicotine dependence: Secondary | ICD-10-CM | POA: Diagnosis not present

## 2013-05-18 DIAGNOSIS — E785 Hyperlipidemia, unspecified: Secondary | ICD-10-CM | POA: Diagnosis present

## 2013-05-18 DIAGNOSIS — N289 Disorder of kidney and ureter, unspecified: Secondary | ICD-10-CM | POA: Diagnosis present

## 2013-05-18 DIAGNOSIS — Z79899 Other long term (current) drug therapy: Secondary | ICD-10-CM | POA: Diagnosis not present

## 2013-05-18 DIAGNOSIS — G8190 Hemiplegia, unspecified affecting unspecified side: Secondary | ICD-10-CM | POA: Diagnosis present

## 2013-05-18 DIAGNOSIS — R569 Unspecified convulsions: Secondary | ICD-10-CM | POA: Diagnosis present

## 2013-05-18 DIAGNOSIS — R4701 Aphasia: Secondary | ICD-10-CM | POA: Diagnosis not present

## 2013-05-18 DIAGNOSIS — J90 Pleural effusion, not elsewhere classified: Secondary | ICD-10-CM

## 2013-05-18 DIAGNOSIS — R29810 Facial weakness: Secondary | ICD-10-CM | POA: Diagnosis present

## 2013-05-18 DIAGNOSIS — R002 Palpitations: Secondary | ICD-10-CM | POA: Diagnosis not present

## 2013-05-18 DIAGNOSIS — I6529 Occlusion and stenosis of unspecified carotid artery: Secondary | ICD-10-CM | POA: Diagnosis present

## 2013-05-18 DIAGNOSIS — Z7901 Long term (current) use of anticoagulants: Secondary | ICD-10-CM | POA: Diagnosis not present

## 2013-05-18 DIAGNOSIS — J189 Pneumonia, unspecified organism: Secondary | ICD-10-CM | POA: Diagnosis not present

## 2013-05-18 DIAGNOSIS — E119 Type 2 diabetes mellitus without complications: Secondary | ICD-10-CM | POA: Diagnosis present

## 2013-05-18 DIAGNOSIS — I059 Rheumatic mitral valve disease, unspecified: Secondary | ICD-10-CM | POA: Diagnosis not present

## 2013-05-18 DIAGNOSIS — Z4682 Encounter for fitting and adjustment of non-vascular catheter: Secondary | ICD-10-CM | POA: Diagnosis not present

## 2013-05-18 DIAGNOSIS — Z5189 Encounter for other specified aftercare: Secondary | ICD-10-CM | POA: Diagnosis not present

## 2013-05-18 DIAGNOSIS — G936 Cerebral edema: Secondary | ICD-10-CM | POA: Diagnosis not present

## 2013-05-18 DIAGNOSIS — I6350 Cerebral infarction due to unspecified occlusion or stenosis of unspecified cerebral artery: Secondary | ICD-10-CM | POA: Diagnosis not present

## 2013-05-18 DIAGNOSIS — R4789 Other speech disturbances: Secondary | ICD-10-CM | POA: Diagnosis not present

## 2013-05-18 DIAGNOSIS — M129 Arthropathy, unspecified: Secondary | ICD-10-CM | POA: Diagnosis present

## 2013-05-18 DIAGNOSIS — I482 Chronic atrial fibrillation, unspecified: Secondary | ICD-10-CM | POA: Diagnosis present

## 2013-05-18 DIAGNOSIS — Z950 Presence of cardiac pacemaker: Secondary | ICD-10-CM

## 2013-05-18 DIAGNOSIS — I658 Occlusion and stenosis of other precerebral arteries: Secondary | ICD-10-CM | POA: Diagnosis present

## 2013-05-18 DIAGNOSIS — J159 Unspecified bacterial pneumonia: Secondary | ICD-10-CM | POA: Diagnosis not present

## 2013-05-18 DIAGNOSIS — D649 Anemia, unspecified: Secondary | ICD-10-CM | POA: Diagnosis present

## 2013-05-18 DIAGNOSIS — IMO0002 Reserved for concepts with insufficient information to code with codable children: Secondary | ICD-10-CM | POA: Diagnosis not present

## 2013-05-18 DIAGNOSIS — E876 Hypokalemia: Secondary | ICD-10-CM

## 2013-05-18 DIAGNOSIS — R4182 Altered mental status, unspecified: Secondary | ICD-10-CM | POA: Diagnosis not present

## 2013-05-18 DIAGNOSIS — F3289 Other specified depressive episodes: Secondary | ICD-10-CM | POA: Diagnosis present

## 2013-05-18 HISTORY — DX: Nontraumatic intracerebral hemorrhage, unspecified: I61.9

## 2013-05-18 HISTORY — DX: Presence of cardiac pacemaker: Z95.0

## 2013-05-18 LAB — CBC
HCT: 47.7 % — ABNORMAL HIGH (ref 36.0–46.0)
Hemoglobin: 15.5 g/dL — ABNORMAL HIGH (ref 12.0–15.0)
MCH: 28.6 pg (ref 26.0–34.0)
MCHC: 32.5 g/dL (ref 30.0–36.0)
MCV: 88 fL (ref 78.0–100.0)
Platelets: 260 10*3/uL (ref 150–400)

## 2013-05-18 LAB — BASIC METABOLIC PANEL
BUN: 22 mg/dL (ref 6–23)
CO2: 28 mEq/L (ref 19–32)
Chloride: 102 mEq/L (ref 96–112)
GFR calc Af Amer: 57 mL/min — ABNORMAL LOW (ref >90)
Glucose, Bld: 144 mg/dL — ABNORMAL HIGH (ref 70–99)
Potassium: 3.6 mEq/L (ref 3.5–5.1)
Sodium: 141 mEq/L (ref 135–145)

## 2013-05-18 LAB — POCT I-STAT 3, ART BLOOD GAS (G3+)
Acid-base deficit: 1 mmol/L (ref 0.0–2.0)
O2 Saturation: 99 %
Patient temperature: 98.6
TCO2: 27 mmol/L (ref 0–100)
pCO2 arterial: 51.9 mmHg — ABNORMAL HIGH (ref 35.0–45.0)
pO2, Arterial: 176 mmHg — ABNORMAL HIGH (ref 80.0–100.0)

## 2013-05-18 MED ORDER — ALBUTEROL SULFATE (5 MG/ML) 0.5% IN NEBU
2.5000 mg | INHALATION_SOLUTION | Freq: Four times a day (QID) | RESPIRATORY_TRACT | Status: DC
  Administered 2013-05-19 – 2013-05-22 (×14): 2.5 mg via RESPIRATORY_TRACT
  Filled 2013-05-18 (×14): qty 0.5

## 2013-05-18 MED ORDER — PROPOFOL 10 MG/ML IV EMUL
5.0000 ug/kg/min | INTRAVENOUS | Status: DC
  Administered 2013-05-18: 10 ug/kg/min via INTRAVENOUS
  Administered 2013-05-19: 35 ug/kg/min via INTRAVENOUS
  Administered 2013-05-19 (×2): 40 ug/kg/min via INTRAVENOUS
  Administered 2013-05-19: 35 ug/kg/min via INTRAVENOUS
  Filled 2013-05-18 (×6): qty 100

## 2013-05-18 MED ORDER — SODIUM CHLORIDE 0.9 % IV SOLN
1.5000 g | Freq: Three times a day (TID) | INTRAVENOUS | Status: DC
  Administered 2013-05-19 – 2013-05-24 (×17): 1.5 g via INTRAVENOUS
  Filled 2013-05-18 (×20): qty 1.5

## 2013-05-18 MED ORDER — LORAZEPAM 2 MG/ML IJ SOLN: 1.0000 mg | Freq: Once | INTRAMUSCULAR | Status: DC

## 2013-05-18 MED ORDER — LORAZEPAM 2 MG/ML IJ SOLN
INTRAMUSCULAR | Status: AC
  Filled 2013-05-18: qty 1

## 2013-05-18 MED ORDER — ALBUTEROL SULFATE (5 MG/ML) 0.5% IN NEBU: 2.5000 mg | INHALATION_SOLUTION | RESPIRATORY_TRACT | Status: DC | PRN

## 2013-05-18 MED ORDER — AMIODARONE HCL 200 MG PO TABS
200.0000 mg | ORAL_TABLET | Freq: Every day | ORAL | Status: DC
  Administered 2013-05-19 – 2013-05-23 (×4): 200 mg
  Filled 2013-05-18 (×5): qty 1

## 2013-05-18 MED ORDER — SODIUM CHLORIDE 0.9 % IV SOLN
250.0000 mL | INTRAVENOUS | Status: DC | PRN
  Administered 2013-05-20: 250 mL via INTRAVENOUS

## 2013-05-18 MED ORDER — POTASSIUM CHLORIDE IN NACL 20-0.9 MEQ/L-% IV SOLN
INTRAVENOUS | Status: DC
Start: 2013-05-18 — End: 2013-05-20
  Administered 2013-05-19 (×2): via INTRAVENOUS
  Filled 2013-05-18 (×3): qty 1000

## 2013-05-18 MED ORDER — LEVOFLOXACIN IN D5W 750 MG/150ML IV SOLN
750.0000 mg | INTRAVENOUS | Status: DC
  Administered 2013-05-18: 750 mg via INTRAVENOUS
  Filled 2013-05-18: qty 150

## 2013-05-18 MED ORDER — FAMOTIDINE IN NACL 20-0.9 MG/50ML-% IV SOLN
20.0000 mg | Freq: Two times a day (BID) | INTRAVENOUS | Status: DC
  Administered 2013-05-19 – 2013-05-21 (×6): 20 mg via INTRAVENOUS
  Filled 2013-05-18 (×9): qty 50

## 2013-05-18 MED ORDER — IPRATROPIUM BROMIDE 0.02 % IN SOLN
0.5000 mg | Freq: Four times a day (QID) | RESPIRATORY_TRACT | Status: DC
  Administered 2013-05-19 – 2013-05-22 (×14): 0.5 mg via RESPIRATORY_TRACT
  Filled 2013-05-18 (×14): qty 2.5

## 2013-05-18 MED ORDER — PROPOFOL 10 MG/ML IV BOLUS
10.0000 mg | Freq: Once | INTRAVENOUS | Status: AC
  Administered 2013-05-18: 10 mg via INTRAVENOUS

## 2013-05-18 MED ORDER — INSULIN ASPART 100 UNIT/ML ~~LOC~~ SOLN
0.0000 [IU] | SUBCUTANEOUS | Status: DC
  Administered 2013-05-19: 1 [IU] via SUBCUTANEOUS

## 2013-05-18 MED ORDER — LORAZEPAM 2 MG/ML IJ SOLN
2.0000 mg | Freq: Once | INTRAMUSCULAR | Status: AC
  Administered 2013-05-18: 2 mg via INTRAVENOUS

## 2013-05-18 MED ORDER — SODIUM CHLORIDE 0.9 % IV SOLN
1000.0000 mg | Freq: Once | INTRAVENOUS | Status: AC
  Administered 2013-05-18: 1000 mg via INTRAVENOUS
  Filled 2013-05-18: qty 10

## 2013-05-18 NOTE — ED Notes (Signed)
Dr Rosalia Hammers spoke with pt daughter, other family members regarding CT results.

## 2013-05-18 NOTE — ED Notes (Signed)
Ct ready.  Pt to ct with rn and rt; remains on monitor.

## 2013-05-18 NOTE — ED Provider Notes (Addendum)
CSN: [id number]     Arrival date & time 05/18/13  2044 History   First MD Initiated Contact with Patient 05/18/13 2106     Chief Complaint  Patient presents with  . Code Stroke   (Consider location/radiation/quality/duration/timing/severity/associated sxs/prior Treatment) HPI 79 year old female who presented as code stroke after her daughter called her and found her speech to be abnormal. Home meds EMS brought her to the emergency department here as a code stroke at which time she was noted to have seizure activity. Patient was noted to have compromised airway and decreased respirations and is urgently intubated she was given Ativan 1 mg for seizure activity which has since resolved  Daughter is now at bedside and gives further history. She was picking her mother earlier in the afternoon and noticed episodes where she seemed to have strange speech. She went over to see her and found her sitting in her chair with right-sided facial droop. She was upright in the chair. Upon EMS arrival she began to have seizure activity. She has no prior history of seizure activity. She does have some lung problems and is being seen by Dr. Maple Hudson. She has not been ill in any way during the past 24 hours. Past Medical History  Diagnosis Date  . Pacemaker   . Atrial fibrillation   . Pericardial effusion   . Hypertension   . Depression   . Arthritis    Past Surgical History  Procedure Laterality Date  . Pacemaker insertion    . Abdominal hysterectomy    . Breast lumpectomy    . Appendectomy    . Bowel resection     No family history on file. History  Substance Use Topics  . Smoking status: Former Smoker    Quit date: 11/26/1968  . Smokeless tobacco: Not on file  . Alcohol Use: No   OB History   Grav Para Term Preterm Abortions TAB SAB Ect Mult Living                 Review of Systems  Unable to perform ROS   Allergies  Review of patient's allergies indicates no known allergies.  Home  Medications   Current Outpatient Rx  Name  Route  Sig  Dispense  Refill  . amiodarone (PACERONE) 200 MG tablet   Oral   Take 200 mg by mouth daily.         Marland Kitchen esomeprazole (NEXIUM) 40 MG capsule   Oral   Take 40 mg by mouth daily before breakfast.         . folic acid (FOLVITE) 400 MCG tablet   Oral   Take 400 mcg by mouth daily.         . furosemide (LASIX) 40 MG tablet   Oral   Take 1 tablet (40 mg total) by mouth daily. Continue as planned (80 mg this week only)   30 tablet   0   . ipratropium (ATROVENT HFA) 17 MCG/ACT inhaler   Inhalation   Inhale 2 puffs into the lungs every 6 (six) hours.         . methotrexate (RHEUMATREX) 2.5 MG tablet   Oral   Take 15 mg by mouth once a week. Caution:Chemotherapy. Protect from light.         . metoprolol tartrate (LOPRESSOR) 25 MG tablet   Oral   Take 25 mg by mouth 2 (two) times daily.         . Misc Natural Products (LAXATIVE FORMULA) TABS  Oral   Take 2 tablets by mouth daily.         . Nutritional Supplements (JUICE PLUS FIBRE PO)   Oral   Take 1 tablet by mouth daily.         . potassium chloride SA (K-DUR,KLOR-CON) 20 MEQ tablet   Oral   Take 1 tablet (20 mEq total) by mouth daily.   30 tablet   0   . rivaroxaban (XARELTO) 10 MG TABS tablet   Oral   Take 20 mg by mouth daily.         . sertraline (ZOLOFT) 50 MG tablet   Oral   Take 50 mg by mouth at bedtime.         . vitamin B-12 (CYANOCOBALAMIN) 1000 MCG tablet   Oral   Take 1,000 mcg by mouth daily.          There were no vitals taken for this visit. Physical Exam  Nursing note and vitals reviewed. Constitutional: She appears well-developed and well-nourished.  HENT:  Head: Atraumatic.  Eyes:  Left pupil abnormal likely secondary to previous surgery  Cardiovascular: Normal rate and regular rhythm.   Pulmonary/Chest:  Patient with very slow agonal type respiration  Abdominal: Soft.  Neurological:  Patient with some eye  deviation and foaming at the mouth and not responsive to command  Skin: Skin is warm and dry.    ED Course  INTUBATION Date/Time: 05/18/2013 9:08 PM Performed by: Hilario Quarry Authorized by: Hilario Quarry Consent: The procedure was performed in an emergent situation. Risks and benefits: risks, benefits and alternatives were discussed Patient identity confirmed: verbally with patient Time out: Immediately prior to procedure a "time out" was called to verify the correct patient, procedure, equipment, support staff and site/side marked as required. Indications: respiratory failure and airway protection Intubation method: video-assisted Patient status: paralyzed (RSI) Sedatives: etomidate Paralytic: succinylcholine Laryngoscope size: Mac 3 Tube size: 8.0 mm Tube type: cuffed Number of attempts: 1 Ventilation between attempts: BVM Cricoid pressure: yes Cords visualized: yes Post-procedure assessment: chest rise and ETCO2 monitor Breath sounds: equal and absent over the epigastrium Cuff inflated: yes ETT to lip: 23 cm Tube secured with: ETT holder Chest x-Tharon Kitch interpreted by radiologist. Chest x-Majed Pellegrin findings: endotracheal tube in appropriate position Patient tolerance: Patient tolerated the procedure well with no immediate complications.   (including critical care time) Labs Review Labs Reviewed  GLUCOSE, CAPILLARY - Abnormal; Notable for the following:    Glucose-Capillary 171 (*)    All other components within normal limits  BASIC METABOLIC PANEL - Abnormal; Notable for the following:    Glucose, Bld 144 (*)    GFR calc non Af Amer 49 (*)    GFR calc Af Amer 57 (*)    All other components within normal limits  CBC - Abnormal; Notable for the following:    WBC 14.2 (*)    RBC 5.42 (*)    Hemoglobin 15.5 (*)    HCT 47.7 (*)    All other components within normal limits  SPUTUM CULTURE   Imaging Review No results found.  EKG Interpretation   None       MDM   No diagnosis found. This is an 79 year old female with questionable new onset of CVA and new onset of seizures who required intubation secondary to respiratory failure. Currently a head CT is pending chest x-Princessa Lesmeister shows possible aspiration pneumonia. I discussed this with Dr. Sung Amabile and the patient is given Levaquin as antibiotics.  The patient is being loaded with Keppra.   I have discussed the patient's care with Dr. Roseanne Reno and he will see patient.   Hilario Quarry, MD 05/18/13 2237  Called by radiologist with abnormal ct with blood in left frontal area consistent with possible postiinfarct.   CRITICAL CARE Performed by: Hilario Quarry Total critical care time: 60 Critical care time was exclusive of separately billable procedures and treating other patients. Critical care was necessary to treat or prevent imminent or life-threatening deterioration. Critical care was time spent personally by me on the following activities: development of treatment plan with patient and/or surrogate as well as nursing, discussions with consultants, evaluation of patient's response to treatment, examination of patient, obtaining history from patient or surrogate, ordering and performing treatments and interventions, ordering and review of laboratory studies, ordering and review of radiographic studies, pulse oximetry and re-evaluation of patient's condition.   Hilario Quarry, MD 05/18/13 0102  Hilario Quarry, MD 06/28/13 1520

## 2013-05-18 NOTE — ED Notes (Addendum)
Pt waking, stirring on left side only.  Spoke with MD regarding need for sedation.

## 2013-05-18 NOTE — H&P (Signed)
PULMONARY  / CRITICAL CARE MEDICINE  Name: Ann Kaufman MRN: [id number] DOB: Jan 12, 1930    ADMISSION DATE:  05/18/2013  REFERRING MD :  EDP PRIMARY SERVICE: PCCM  BRIEF PATIENT DESCRIPTION:  5 F with PMH of CAF, chronic anticoagulation. Lives independently @ baseline. Brought by EMS to [facility] ED as code stroke and seizure. Intubated due to AMS with compromised airway and resp distress. CT head revealed ICH.   SIGNIFICANT EVENTS / STUDIES:  11/26 CT head: Left frontal lobe intraparenchymal hematoma with intraventricular hemorrhage within the left lateral ventricle as above. Hemorrhagic infarct with intraventricular extension is suspected. No midline shift or hydrocephalus identified 11/26 Admission CXR: diffuse infiltrates/edema pattern  LINES / TUBES: ETT 11/26 >>   CULTURES: Resp 11/26 >>   ANTIBIOTICS: Amp-sulbactam 11/26 >>   HISTORY OF PRESENT ILLNESS:   42 F with CAF, chronic anticoagulation but independent living @ baseline was noted to have impaired speech by daughter when speaking on phone with her. When daughter checked on her, R sided facial droop was noted by daughter and EMS was dispatched. Transported initially to [facility] ED. Transferred to [facility] as code stroke. Was noted by EMS to suffer GTC seizure en route to [facility]. Developed respiratory distress in ED and underwent intubation by EDP. PCCM asked to admit. CT head reveal ICH  PAST MEDICAL HISTORY :  Past Medical History  Diagnosis Date  . Pacemaker   . Atrial fibrillation   . Pericardial effusion   . Hypertension   . Depression   . Arthritis    Past Surgical History  Procedure Laterality Date  . Pacemaker insertion    . Abdominal hysterectomy    . Breast lumpectomy    . Appendectomy    . Bowel resection     Prior to Admission medications   Medication Sig Start Date End Date Taking? Authorizing Provider  amiodarone (PACERONE) 200 MG tablet Take 200 mg by mouth daily.    Historical Provider, MD  esomeprazole  (NEXIUM) 40 MG capsule Take 40 mg by mouth daily before breakfast.    Historical Provider, MD  folic acid (FOLVITE) 400 MCG tablet Take 400 mcg by mouth daily.    Historical Provider, MD  furosemide (LASIX) 40 MG tablet Take 1 tablet (40 mg total) by mouth daily. Continue as planned (80 mg this week only) 11/28/11   Hollice Espy, MD  ipratropium (ATROVENT HFA) 17 MCG/ACT inhaler Inhale 2 puffs into the lungs every 6 (six) hours.    Historical Provider, MD  methotrexate (RHEUMATREX) 2.5 MG tablet Take 15 mg by mouth once a week. Caution:Chemotherapy. Protect from light.    Historical Provider, MD  metoprolol tartrate (LOPRESSOR) 25 MG tablet Take 25 mg by mouth 2 (two) times daily.    Historical Provider, MD  Misc Natural Products (LAXATIVE FORMULA) TABS Take 2 tablets by mouth daily.    Historical Provider, MD  Nutritional Supplements (JUICE PLUS FIBRE PO) Take 1 tablet by mouth daily.    Historical Provider, MD  potassium chloride SA (K-DUR,KLOR-CON) 20 MEQ tablet Take 1 tablet (20 mEq total) by mouth daily. 11/28/11   Hollice Espy, MD  rivaroxaban (XARELTO) 10 MG TABS tablet Take 20 mg by mouth daily.    Historical Provider, MD  sertraline (ZOLOFT) 50 MG tablet Take 50 mg by mouth at bedtime.    Historical Provider, MD  vitamin B-12 (CYANOCOBALAMIN) 1000 MCG tablet Take 1,000 mcg by mouth daily.    Historical Provider, MD   No Known  Allergies  FAMILY HISTORY:  No family history on file. SOCIAL HISTORY:  reports that she quit smoking about 44 years ago. She does not have any smokeless tobacco history on file. She reports that she does not drink alcohol or use illicit drugs.  REVIEW OF SYSTEMS:  Level 5 caveat  SUBJECTIVE:   VITAL SIGNS: Pulse Rate:  [59-64] 60 (11/26 2330) Resp:  [15-28] 15 (11/26 2330) BP: (116-159)/(49-83) 119/49 mmHg (11/26 2330) SpO2:  [94 %-100 %] 100 % (11/26 2330) FiO2 (%):  [100 %] 100 % (11/26 2045) Weight:  [68.04 kg (150 lb)] 68.04 kg (150 lb) (11/26  2127) HEMODYNAMICS:   VENTILATOR SETTINGS: Vent Mode:  [-] PRVC FiO2 (%):  [100 %] 100 % Set Rate:  [14 bmp] 14 bmp PEEP:  [5 cmH20] 5 cmH20 Plateau Pressure:  [20 cmH20] 20 cmH20 INTAKE / OUTPUT: Intake/Output   None     PHYSICAL EXAMINATION: General:  WDWN, intubated, writhing @ times Neuro: Moves LUE and LLE spontaneously, no spont movement of R side but will withdraw RUE and RLE from pain with some apparent weakness HEENT: NCAT, EOMI, PERRL Cardiovascular: RRR, HS obscured by rhonchi Lungs: blood tinged mucoid secretions, diffuse rhonchi Abdomen: Soft, NT, NABS Ext: warm, no edema  LABS:  CBC  Recent Labs Lab 05/18/13 2113  WBC 14.2*  HGB 15.5*  HCT 47.7*  PLT 260   Coag's No results found for this basename: APTT, INR,  in the last 168 hours BMET  Recent Labs Lab 05/18/13 2113  NA 141  K 3.6  CL 102  CO2 28  BUN 22  CREATININE 1.05  GLUCOSE 144*   Electrolytes  Recent Labs Lab 05/18/13 2113  CALCIUM 9.2   Sepsis Markers No results found for this basename: LATICACIDVEN, PROCALCITON, O2SATVEN,  in the last 168 hours ABG  Recent Labs Lab 05/18/13 2249  PHART 7.305*  PCO2ART 51.9*  PO2ART 176.0*   Liver Enzymes No results found for this basename: AST, ALT, ALKPHOS, BILITOT, ALBUMIN,  in the last 168 hours Cardiac Enzymes No results found for this basename: TROPONINI, PROBNP,  in the last 168 hours Glucose  Recent Labs Lab 05/18/13 2046  GLUCAP 171*    Imaging Ct Head Wo Contrast   (if New Onset Seizure And/or Head Trauma)  05/18/2013   CLINICAL DATA:  Unresponsive  EXAM: CT HEAD WITHOUT CONTRAST  TECHNIQUE: Contiguous axial images were obtained from the base of the skull through the vertex without intravenous contrast.  COMPARISON:  None.  FINDINGS: A left frontal intraparenchymal hematoma measuring 3.6 x 3.8 cm is seen (series 2, image 16). This hematoma involves the overlying cortical gray matter as well as the subcortical white  matter, and likely the left caudate head. There is a small amount of localized edema. Hyperdense intraventricular hemorrhage is seen within the left lateral ventricle as well (series 2, image 18). No significant midline shift identified at this time. There is no hydrocephalus. No acute intracranial hemorrhage seen within the right cerebral hemisphere. There is no extra-axial fluid collection.  Mild atrophy with chronic microvascular ischemic changes are present. No mass lesion identified. The calvarium is intact.  Paranasal sinuses and mastoid air cells are clear.  Endotracheal tube is partially visualized.  IMPRESSION: 1. Left frontal lobe intraparenchymal hematoma with intraventricular hemorrhage within the left lateral ventricle as above. Hemorrhagic infarct with intraventricular extension is suspected. No midline shift or hydrocephalus identified. 2. Atrophy with chronic microvascular ischemic changes.  Critical Value/emergent results were called by telephone  at the time of interpretation on 05/18/2013 at 11:03 PM to Dr.DANIELLE RAY , who verbally acknowledged these results.   Electronically Signed   By: Rise Mu M.D.   On: 05/18/2013 23:08   Dg Chest Portable 1 View  05/18/2013   CLINICAL DATA:  Post intubation.  EXAM: PORTABLE CHEST - 1 VIEW  COMPARISON:  CT chest and chest radiograph 11/26/2011.  FINDINGS: Endotracheal tube terminates 3.1 cm above the carina. Nasogastric tube is followed into the stomach. Left subclavian pacemaker lead tips project over the right atrium and right ventricle. Heart size normal. There is upper and midlung zone predominant airspace disease. No pleural fluid.  IMPRESSION: 1. Satisfactory endotracheal and nasogastric tube placements. 2. Bilateral airspace disease may be due to pneumonia. Noncardiogenic edema is another consideration.   Electronically Signed   By: Leanna Battles M.D.   On: 05/18/2013 21:25     CXR: as above  ASSESSMENT / PLAN:  PULMONARY A:  Acute respiratory distress Pulmonary edema (cardiogenic vs neurogenic) vs ALI /ARDS Former smoker on chronic bronchodilators (Ipratropium) P:  Vent settings established Vent bundle ordered Empiric BDs ordered  CARDIOVASCULAR A: Chronic atrial fibrillation S/P PPM (intermittent PM dependent) H/O Htn P:  Monitor BP goals to be established by Neurology  RENAL A:  Mild renal insuff, uncertain chronicity P:   Monitor BMET intermittently Correct electrolytes as indicated Avoid free water as at risk for cerebral edema  GASTROINTESTINAL A:  No issues P:   SUP: IV famotidine Begin TFs AM 11/27  HEMATOLOGIC A:  No issues Chronic anticoagulation with Xarelto P:  DVT px: SCDs Monitor CBC intermittently Anticoagulation reversal protocol ordered  INFECTIOUS A:  Possible aspiration PNA P:   Micro and abx as above  ENDOCRINE A:  Mild hyperglycemia without prior dx of DM P:   CBGs/SSI ordered  NEUROLOGIC A:  Acute L hemispheric intraparenchymal bleed with R hemiparesis P:   Neuro/Stroke consult Sedation with propofol Analgesia with fentanyl Daily WUA unless contraindicated   TODAY'S SUMMARY:   I have personally obtained a history, examined the patient, evaluated laboratory and imaging results, formulated the assessment and plan and placed orders. CRITICAL CARE: The patient is critically ill with multiple organ systems failure and requires high complexity decision making for assessment and support, frequent evaluation and titration of therapies, application of advanced monitoring technologies and extensive interpretation of multiple databases. Critical Care Time devoted to patient care services described in this note is 40 minutes.     Billy Fischer, MD; PCCM service Pulmonary and Critical Care Medicine [facility] Pager: [phone]  05/18/2013, 11:56 PM

## 2013-05-18 NOTE — ED Notes (Addendum)
Approximately 6 pm daughter was talking to pt on phone. Pt was speaking garbled and daughter thought patient doesn't sound right. PT was unable to answer. Pt sounded confused according to daughter. Pt started to recover. Daughter thought blood sugar. 10-15 min later patient sounded normal. Daughter left for patient's house. Pt found in recliner at her house. Saliva was coming out of patients mouth and right side was droopy.Eyes were open, but unfocused. Pupils were constricted. Nystagmus according to daughter. Daughter states that patient's breathing was shallow. EMS was called. When EMS arrived the patient was seizing. Pt has a pacemaker. No known hx of seizures.   Pt arrived at hospital being bagged by EMS. Code stroke was canceled because patient was activly seizing. Pt roomed in A1. Pt was given IV ativan 2 mgat 2040 and placed on NRB. Airway determined to be unsuited and decision made to RSI. Etomidate 10 mg was given IV, followed by a flush. Succinocolyn was  Given 125 mg IV and followed by flush. Intubation completed at 2048 by Rosalia Hammers, MD.

## 2013-05-18 NOTE — ED Notes (Signed)
Warm blanket applied.  Daughter remains at bedside awaiting Dr Roseanne Reno.

## 2013-05-19 ENCOUNTER — Inpatient Hospital Stay (HOSPITAL_COMMUNITY): Payer: Medicare Other

## 2013-05-19 ENCOUNTER — Encounter (HOSPITAL_COMMUNITY): Payer: Self-pay

## 2013-05-19 DIAGNOSIS — S0003XA Contusion of scalp, initial encounter: Secondary | ICD-10-CM | POA: Diagnosis not present

## 2013-05-19 DIAGNOSIS — Z4682 Encounter for fitting and adjustment of non-vascular catheter: Secondary | ICD-10-CM | POA: Diagnosis not present

## 2013-05-19 DIAGNOSIS — I619 Nontraumatic intracerebral hemorrhage, unspecified: Secondary | ICD-10-CM | POA: Diagnosis not present

## 2013-05-19 DIAGNOSIS — Z7901 Long term (current) use of anticoagulants: Secondary | ICD-10-CM

## 2013-05-19 DIAGNOSIS — J690 Pneumonitis due to inhalation of food and vomit: Secondary | ICD-10-CM | POA: Diagnosis present

## 2013-05-19 DIAGNOSIS — I10 Essential (primary) hypertension: Secondary | ICD-10-CM

## 2013-05-19 DIAGNOSIS — J9600 Acute respiratory failure, unspecified whether with hypoxia or hypercapnia: Secondary | ICD-10-CM | POA: Diagnosis not present

## 2013-05-19 DIAGNOSIS — I4891 Unspecified atrial fibrillation: Secondary | ICD-10-CM | POA: Diagnosis not present

## 2013-05-19 LAB — POCT I-STAT 3, ART BLOOD GAS (G3+)
Bicarbonate: 26.5 mEq/L — ABNORMAL HIGH (ref 20.0–24.0)
pCO2 arterial: 46.1 mmHg — ABNORMAL HIGH (ref 35.0–45.0)
pO2, Arterial: 112 mmHg — ABNORMAL HIGH (ref 80.0–100.0)

## 2013-05-19 LAB — URINALYSIS, ROUTINE W REFLEX MICROSCOPIC
Bilirubin Urine: NEGATIVE
Glucose, UA: NEGATIVE mg/dL
Leukocytes, UA: NEGATIVE
Protein, ur: 30 mg/dL — AB
Specific Gravity, Urine: 1.011 (ref 1.005–1.030)
Urobilinogen, UA: 0.2 mg/dL (ref 0.0–1.0)

## 2013-05-19 LAB — GLUCOSE, CAPILLARY
Glucose-Capillary: 86 mg/dL (ref 70–99)
Glucose-Capillary: 88 mg/dL (ref 70–99)
Glucose-Capillary: 88 mg/dL (ref 70–99)
Glucose-Capillary: 93 mg/dL (ref 70–99)
Glucose-Capillary: 99 mg/dL (ref 70–99)

## 2013-05-19 LAB — HEMOGLOBIN A1C
Hgb A1c MFr Bld: 5.9 % — ABNORMAL HIGH (ref <5.7)
Mean Plasma Glucose: 123 mg/dL — ABNORMAL HIGH (ref <117)

## 2013-05-19 LAB — CBC
HCT: 38 % (ref 36.0–46.0)
Hemoglobin: 12.2 g/dL (ref 12.0–15.0)
MCV: 87.4 fL (ref 78.0–100.0)
RBC: 4.35 MIL/uL (ref 3.87–5.11)
RDW: 14.7 % (ref 11.5–15.5)
WBC: 10.5 10*3/uL (ref 4.0–10.5)

## 2013-05-19 LAB — BASIC METABOLIC PANEL
BUN: 21 mg/dL (ref 6–23)
Calcium: 8.3 mg/dL — ABNORMAL LOW (ref 8.4–10.5)
Chloride: 103 mEq/L (ref 96–112)
GFR calc Af Amer: 54 mL/min — ABNORMAL LOW (ref >90)
Glucose, Bld: 123 mg/dL — ABNORMAL HIGH (ref 70–99)
Potassium: 4.2 mEq/L (ref 3.5–5.1)
Sodium: 138 mEq/L (ref 135–145)

## 2013-05-19 LAB — URINE MICROSCOPIC-ADD ON

## 2013-05-19 LAB — PROTIME-INR
INR: 1.51 — ABNORMAL HIGH (ref 0.00–1.49)
INR: 1.58 — ABNORMAL HIGH (ref 0.00–1.49)
Prothrombin Time: 17.8 seconds — ABNORMAL HIGH (ref 11.6–15.2)
Prothrombin Time: 18.4 seconds — ABNORMAL HIGH (ref 11.6–15.2)
Prothrombin Time: 16.6 seconds — ABNORMAL HIGH (ref 11.6–15.2)

## 2013-05-19 MED ORDER — PROTHROMBIN COMPLEX CONC HUMAN 500 UNITS IV KIT
3339.0000 [IU] | PACK | Status: AC
  Administered 2013-05-19: 3339 [IU] via INTRAVENOUS
  Filled 2013-05-19: qty 134

## 2013-05-19 MED ORDER — SODIUM CHLORIDE 0.9 % IV SOLN
500.0000 mg | Freq: Two times a day (BID) | INTRAVENOUS | Status: DC
  Administered 2013-05-19 – 2013-05-21 (×6): 500 mg via INTRAVENOUS
  Filled 2013-05-19 (×9): qty 5

## 2013-05-19 MED ORDER — CHLORHEXIDINE GLUCONATE 0.12 % MT SOLN
15.0000 mL | Freq: Two times a day (BID) | OROMUCOSAL | Status: DC
  Administered 2013-05-19 – 2013-05-24 (×9): 15 mL via OROMUCOSAL
  Filled 2013-05-19 (×14): qty 15

## 2013-05-19 MED ORDER — FENTANYL CITRATE 0.05 MG/ML IJ SOLN
25.0000 ug | INTRAMUSCULAR | Status: DC | PRN
  Administered 2013-05-19: 50 ug via INTRAVENOUS
  Administered 2013-05-19: 25 ug via INTRAVENOUS
  Administered 2013-05-20 (×2): 50 ug via INTRAVENOUS
  Filled 2013-05-19 (×4): qty 2

## 2013-05-19 MED ORDER — ACETAMINOPHEN 160 MG/5ML PO SOLN
650.0000 mg | ORAL | Status: DC | PRN
  Administered 2013-05-19: 650 mg
  Filled 2013-05-19 (×2): qty 20.3

## 2013-05-19 MED ORDER — BIOTENE DRY MOUTH MT LIQD
15.0000 mL | Freq: Two times a day (BID) | OROMUCOSAL | Status: DC
  Administered 2013-05-19 – 2013-05-24 (×8): 15 mL via OROMUCOSAL

## 2013-05-19 NOTE — Progress Notes (Signed)
Transported patient from E.D to 2 [place] room 6. Patient remained stable during the transport

## 2013-05-19 NOTE — ED Notes (Signed)
Dr Roseanne Reno speaking with daughter.

## 2013-05-19 NOTE — Progress Notes (Signed)
Pt transported to/from CT on vent w/ no apparent complications, OET secure.

## 2013-05-19 NOTE — Consult Note (Signed)
Referring Physician: Dr. Rosalia Hammers    Chief Complaint: Acute onset of facial droop, mental status change and right focal seizures.  HPI: Ann Kaufman is an 79 y.o. female with a history of atrial fibrillation and pacemaker placement, anticoagulation on Xarelto, and hypertension who was brought to the emergency room after developing speech difficulty and right facial droop as well as seizure activity. She was noted to have focal motor seizure activity on arrival in the emergency room in addition to being unresponsive. She was intubated for airway protection following administration of 2 mg of Ativan which stopped seizure activity. Propofol drip was started for sedation as well as seizure management. CT scan of her head showed a 3.6 cm left frontal intraparenchymal hematoma. There was evidence of left lateral ventricular extension. No signs of hydrocephalus were seen. A loading dose of Keppra was given, 1000 mg. Anticoagulation reversing protocol was also ordered.  LSN: 6:30 PM on 05/18/2013 tPA Given: No: Acute intraparenchymal hemorrhage. MRankin: 4  Past Medical History  Diagnosis Date  . Pacemaker   . Atrial fibrillation   . Pericardial effusion   . Hypertension   . Depression   . Arthritis     No family history on file.   Medications: I have reviewed the patient's current medications.  ROS: History obtained from child and chart review  General ROS: negative for - chills, fatigue, fever, night sweats, weight gain or weight loss Psychological ROS: negative for - behavioral disorder, hallucinations, memory difficulties, mood swings or suicidal ideation Ophthalmic ROS: negative for - blurry vision, double vision, eye pain or loss of vision ENT ROS: negative for - epistaxis, nasal discharge, oral lesions, sore throat, tinnitus or vertigo Allergy and Immunology ROS: negative for - hives or itchy/watery eyes Hematological and Lymphatic ROS: negative for - bleeding problems, bruising or  swollen lymph nodes Endocrine ROS: negative for - galactorrhea, hair pattern changes, polydipsia/polyuria or temperature intolerance Respiratory ROS: negative for - cough, hemoptysis, shortness of breath or wheezing Cardiovascular ROS: negative for - chest pain, dyspnea on exertion, edema or irregular heartbeat Gastrointestinal ROS: negative for - abdominal pain, diarrhea, hematemesis, nausea/vomiting or stool incontinence Genito-Urinary ROS: negative for - dysuria, hematuria, incontinence or urinary frequency/urgency Musculoskeletal ROS: negative for - joint swelling or muscular weakness Neurological ROS: as noted in HPI Dermatological ROS: negative for rash and skin lesion changes  Physical Examination: Blood pressure 107/52, pulse 59, resp. rate 15, height 5\' 4"  (1.626 m), weight 68.04 kg (150 lb), SpO2 100.00%.  Neurologic Examination: Patient was intubated and on mechanical ventilation. She was also on sedation with propofol IV. Patient had no responses to external stimuli. Pupils were equal and reacted normally to light. Extraocular movements are absent with oculocephalic maneuvers. Face was symmetrical with no focal weakness. Muscle tone was flaccid throughout. Patient occasional spontaneous movements of left extremities, but none on the right. There was no abnormal posturing. Deep tendon reflexes were 2+ and symmetrical. Right plantar response is extensor, and left response was flexor.   Ct Head Wo Contrast   (if New Onset Seizure And/or Head Trauma)  05/18/2013   CLINICAL DATA:  Unresponsive  EXAM: CT HEAD WITHOUT CONTRAST  TECHNIQUE: Contiguous axial images were obtained from the base of the skull through the vertex without intravenous contrast.  COMPARISON:  None.  FINDINGS: A left frontal intraparenchymal hematoma measuring 3.6 x 3.8 cm is seen (series 2, image 16). This hematoma involves the overlying cortical gray matter as well as the subcortical white matter, and likely  the  left caudate head. There is a small amount of localized edema. Hyperdense intraventricular hemorrhage is seen within the left lateral ventricle as well (series 2, image 18). No significant midline shift identified at this time. There is no hydrocephalus. No acute intracranial hemorrhage seen within the right cerebral hemisphere. There is no extra-axial fluid collection.  Mild atrophy with chronic microvascular ischemic changes are present. No mass lesion identified. The calvarium is intact.  Paranasal sinuses and mastoid air cells are clear.  Endotracheal tube is partially visualized.  IMPRESSION: 1. Left frontal lobe intraparenchymal hematoma with intraventricular hemorrhage within the left lateral ventricle as above. Hemorrhagic infarct with intraventricular extension is suspected. No midline shift or hydrocephalus identified. 2. Atrophy with chronic microvascular ischemic changes.  Critical Value/emergent results were called by telephone at the time of interpretation on 05/18/2013 at 11:03 PM to Dr.DANIELLE RAY , who verbally acknowledged these results.   Electronically Signed   By: Rise Mu M.D.   On: 05/18/2013 23:08   Dg Chest Portable 1 View  05/18/2013   CLINICAL DATA:  Post intubation.  EXAM: PORTABLE CHEST - 1 VIEW  COMPARISON:  CT chest and chest radiograph 11/26/2011.  FINDINGS: Endotracheal tube terminates 3.1 cm above the carina. Nasogastric tube is followed into the stomach. Left subclavian pacemaker lead tips project over the right atrium and right ventricle. Heart size normal. There is upper and midlung zone predominant airspace disease. No pleural fluid.  IMPRESSION: 1. Satisfactory endotracheal and nasogastric tube placements. 2. Bilateral airspace disease may be due to pneumonia. Noncardiogenic edema is another consideration.   Electronically Signed   By: Leanna Battles M.D.   On: 05/18/2013 21:25    Assessment: 79 y.o. female with atrial fibrillation on Xarelto presenting  with speech changes, right hemiparesis and new-onset seizure activity, associated with acute left frontal intraparenchymal hemorrhage with ventricular extension.  Stroke Risk Factors - hyperlipidemia and hypertension  Plan: 1. HgbA1c, fasting lipid panel 2. MRI, MRA  of the brain without contrast 3. PT consult, OT consult, Speech consult 4. Echocardiogram 5. Carotid dopplers 6. Prophylactic therapy-None 7. Repeat CT scan of the head without contrast later today 8. Telemetry monitoring   C.R. Roseanne Reno, MD [facility] [phone]  05/19/2013, 12:47 AM

## 2013-05-19 NOTE — Progress Notes (Addendum)
Stroke Team Progress Note  HISTORY  Ann Kaufman is an 79 y.o. female with a history of atrial fibrillation and pacemaker placement, anticoagulation on Xarelto, and hypertension who was brought to the emergency room after developing speech difficulty and right facial droop as well as seizure activity. She was noted to have focal motor seizure activity on arrival in the emergency room in addition to being unresponsive. She was intubated for airway protection following administration of 2 mg of Ativan which stopped seizure activity. Propofol drip was started for sedation as well as seizure management. CT scan of her head showed a 3.6 cm left frontal intraparenchymal hematoma. There was evidence of left lateral ventricular extension. No signs of hydrocephalus were seen. A loading dose of Keppra was given, 1000 mg. Anticoagulation reversing protocol was also ordered.  LSN: 6:30 PM on 05/18/2013  tPA Given: No: Acute intraparenchymal hemorrhage.  MRankin: 4   Patient was not a TPA candidate secondary to ICH.   SUBJECTIVE No family is at bedside. The patient is intubated, sedated.  OBJECTIVE Most recent Vital Signs: Filed Vitals:   05/19/13 0730 05/19/13 0747 05/19/13 0800 05/19/13 0842  BP: 105/47 105/58 101/86   Pulse: 60 64 61   Temp:    100.6 F (38.1 C)  TempSrc:    Oral  Resp: 15 19 20    Height:      Weight:      SpO2: 100% 100% 100%    CBG (last 3)   Recent Labs  05/19/13 0111 05/19/13 0417 05/19/13 0802  GLUCAP 131* 86 88  88    IV Fluid Intake:   . 0.9 % NaCl with KCl 20 mEq / L 50 mL/hr at 05/19/13 0138  . propofol 35 mcg/kg/min (05/19/13 0841)    MEDICATIONS  . albuterol  2.5 mg Nebulization Q6H  . amiodarone  200 mg Per Tube Daily  . ampicillin-sulbactam (UNASYN) IV  1.5 g Intravenous Q8H  . famotidine (PEPCID) IV  20 mg Intravenous Q12H  . insulin aspart  0-9 Units Subcutaneous Q4H  . ipratropium  0.5 mg Nebulization Q6H  . LORazepam       PRN:  sodium  chloride, albuterol, fentaNYL  Diet:   NPO Activity:  Bedrest DVT Prophylaxis:  SCD  CLINICALLY SIGNIFICANT STUDIES Basic Metabolic Panel:  Recent Labs Lab 05/18/13 2113 05/19/13 0230  NA 141 138  K 3.6 4.2  CL 102 103  CO2 28 24  GLUCOSE 144* 123*  BUN 22 21  CREATININE 1.05 1.09  CALCIUM 9.2 8.3*   Liver Function Tests: No results found for this basename: AST, ALT, ALKPHOS, BILITOT, PROT, ALBUMIN,  in the last 168 hours CBC:  Recent Labs Lab 05/18/13 2113 05/19/13 0230  WBC 14.2* 10.5  HGB 15.5* 12.2  HCT 47.7* 38.0  MCV 88.0 87.4  PLT 260 153   Coagulation:  Recent Labs Lab 05/19/13 0230 05/19/13 0820  LABPROT 17.8* 18.4*  INR 1.51* 1.58*   Cardiac Enzymes: No results found for this basename: CKTOTAL, CKMB, CKMBINDEX, TROPONINI,  in the last 168 hours Urinalysis:  Recent Labs Lab 05/19/13 0135  COLORURINE YELLOW  LABSPEC 1.011  PHURINE 5.5  GLUCOSEU NEGATIVE  HGBUR NEGATIVE  BILIRUBINUR NEGATIVE  KETONESUR NEGATIVE  PROTEINUR 30*  UROBILINOGEN 0.2  NITRITE NEGATIVE  LEUKOCYTESUR NEGATIVE   Lipid Panel No results found for this basename: chol, trig, hdl, cholhdl, vldl, ldlcalc   HgbA1C  No results found for this basename: HGBA1C    Urine Drug Screen:  No results found for this basename: labopia, cocainscrnur, labbenz, amphetmu, thcu, labbarb    Alcohol Level: No results found for this basename: ETH,  in the last 168 hours  Ct Head Wo Contrast   (if New Onset Seizure And/or Head Trauma)  05/18/2013   CLINICAL DATA:  Unresponsive  EXAM: CT HEAD WITHOUT CONTRAST  TECHNIQUE: Contiguous axial images were obtained from the base of the skull through the vertex without intravenous contrast.  COMPARISON:  None.  FINDINGS: A left frontal intraparenchymal hematoma measuring 3.6 x 3.8 cm is seen (series 2, image 16). This hematoma involves the overlying cortical gray matter as well as the subcortical white matter, and likely the left caudate head. There  is a small amount of localized edema. Hyperdense intraventricular hemorrhage is seen within the left lateral ventricle as well (series 2, image 18). No significant midline shift identified at this time. There is no hydrocephalus. No acute intracranial hemorrhage seen within the right cerebral hemisphere. There is no extra-axial fluid collection.  Mild atrophy with chronic microvascular ischemic changes are present. No mass lesion identified. The calvarium is intact.  Paranasal sinuses and mastoid air cells are clear.  Endotracheal tube is partially visualized.  IMPRESSION: 1. Left frontal lobe intraparenchymal hematoma with intraventricular hemorrhage within the left lateral ventricle as above. Hemorrhagic infarct with intraventricular extension is suspected. No midline shift or hydrocephalus identified. 2. Atrophy with chronic microvascular ischemic changes.  Critical Value/emergent results were called by telephone at the time of interpretation on 05/18/2013 at 11:03 PM to Dr.DANIELLE RAY , who verbally acknowledged these results.   Electronically Signed   By: Rise Mu M.D.   On: 05/18/2013 23:08   Dg Chest Port 1 View  05/19/2013   CLINICAL DATA:  Endotracheal tube evaluation.  EXAM: PORTABLE CHEST - 1 VIEW  COMPARISON:  05/18/2013.  FINDINGS: Endotracheal tube tip is a 2.2 cm above the carina.  Nasogastric tube courses below the diaphragm. Tip is not included on the present exam.  Sequential pacemaker enters from the left with leads unchanged.  Heart is slightly enlarged.  Decreased in degree although incomplete clearance of asymmetric airspace disease. This may represent resolving pulmonary edema. Infectious infiltrate not excluded in the proper clinical setting. Residual left base atelectasis/ infiltrate.  No gross pneumothorax.  Calcified aorta.  IMPRESSION: Decreased in degree although incomplete clearance of asymmetric airspace disease. This may represent resolving pulmonary edema. Infectious  infiltrate not excluded in the proper clinical setting. Residual left base atelectasis/ infiltrate.  Endotracheal tube tip 2.2 cm above the carina.   Electronically Signed   By: Bridgett Larsson M.D.   On: 05/19/2013 08:04   Dg Chest Portable 1 View  05/18/2013   CLINICAL DATA:  Post intubation.  EXAM: PORTABLE CHEST - 1 VIEW  COMPARISON:  CT chest and chest radiograph 11/26/2011.  FINDINGS: Endotracheal tube terminates 3.1 cm above the carina. Nasogastric tube is followed into the stomach. Left subclavian pacemaker lead tips project over the right atrium and right ventricle. Heart size normal. There is upper and midlung zone predominant airspace disease. No pleural fluid.  IMPRESSION: 1. Satisfactory endotracheal and nasogastric tube placements. 2. Bilateral airspace disease may be due to pneumonia. Noncardiogenic edema is another consideration.   Electronically Signed   By: Leanna Battles M.D.   On: 05/18/2013 21:25    CT of the brain   IMPRESSION:  1. Left frontal lobe intraparenchymal hematoma with intraventricular  hemorrhage within the left lateral ventricle as above. Hemorrhagic  infarct  with intraventricular extension is suspected. No midline  shift or hydrocephalus identified.  2. Atrophy with chronic microvascular ischemic changes.  MRI of the brain    MRA of the brain    2D Echocardiogram    Carotid Doppler    CXR   IMPRESSION:  1. Satisfactory endotracheal and nasogastric tube placements.  2. Bilateral airspace disease may be due to pneumonia.  Noncardiogenic edema is another consideration.   EKG   Sinus rhythm Borderline prolonged PR interval Probable left atrial enlargement Incomplete left bundle branch block LVH with secondary repolarization abnormality Borderline prolonged QT interval  Therapy Recommendations Pending  Physical Exam  General: The patient is intubated and sedated time of examination.  Skin: No significant peripheral edema is  noted.   Neurologic Exam  Mental status: The patient is unable to follow commands.  Cranial nerves: Facial symmetry is present. Eyes are mid position, minimal dolls. The patient is not blink to threat well from either side.  Motor: The patient is able to move the arm symmetrically, less movement in the legs is seen, again movements are symmetric.  Sensory examination: The patient responds slightly to deep pain stimulation on all 4 extremities.  Coordination: The patient could not cooperate for cerebellar testing.  Gait and station: The gait could not be tested.  Reflexes: Deep tendon reflexes are symmetric.    ASSESSMENT [name] is a 79 y.o. female presenting with a left frontal intracranial hemorrhage. The patient was on Xarelto prior to admission. The patient is off of antiplatelet medications and anticoagulants at this time. Supportive care. FEIBA protocol. Seizure at the time of presentation. The patient is on Keppra.   Anticoagulation on admission  Atrial fibrillation  Hypertension  Pacemaker placement  History of pericardial patient  Depression  Hospital day # 1  TREATMENT/PLAN  Continue medication.  Supportive care  2-D echocardiogram  Carotid Doppler study  Physical, occupational, and speech therapy to evaluate when appropriate  Critical care medicine is managing   will add a maintenance dose of Keppra.  CT brain today, followup study  WILLIS,CHARLES KEITH  05/19/2013 9:40 AM

## 2013-05-19 NOTE — ED Notes (Signed)
Report to [facility] on 18M.  Pt to go to floor with RN and RT accompanying, propofol and NS infusing.

## 2013-05-19 NOTE — Evaluation (Signed)
SLP Cancellation Note  Patient Details Name: Ann Kaufman MRN: [id number] DOB: 06-06-1930   Cancelled treatment:       Reason Eval/Treat Not Completed: Medical issues which prohibited therapy  Per MD note, shift assessment, pt remains intubated.  Will see 11/28.     Donavan Burnet, MS [facility] SLP [phone]

## 2013-05-19 NOTE — Progress Notes (Addendum)
PULMONARY  / CRITICAL CARE MEDICINE  Name: Ann Kaufman MRN: [id number] DOB: May 20, 1930    ADMISSION DATE:  05/18/2013  REFERRING MD :  EDP PRIMARY SERVICE: PCCM  BRIEF PATIENT DESCRIPTION:  49 F with PMH of CAF, chronic anticoagulation. Lives independently @ baseline. Brought by EMS to [facility] ED as code stroke and seizure. Intubated due to AMS with compromised airway and resp distress. CT head revealed ICH.   SIGNIFICANT EVENTS / STUDIES:  11/26 CT head: Left frontal lobe intraparenchymal hematoma with intraventricular hemorrhage within the left lateral ventricle as above. Hemorrhagic infarct with intraventricular extension is suspected. No midline shift or hydrocephalus identified  11/26 Admission CXR: diffuse infiltrates/edema pattern  LINES / TUBES: ETT 11/26 >>   CULTURES: Resp 11/26 >>   ANTIBIOTICS: Amp-sulbactam 11/26 >>   SUBJECTIVE: Asleep, sedated, not following commands.  VITAL SIGNS: Temp:  [98.6 F (37 C)-99.2 F (37.3 C)] 98.6 F (37 C) (11/27 0418) Pulse Rate:  [59-75] 64 (11/27 0747) Resp:  [14-28] 19 (11/27 0747) BP: (87-159)/(38-83) 105/58 mmHg (11/27 0747) SpO2:  [94 %-100 %] 100 % (11/27 0747) FiO2 (%):  [60 %-100 %] 60 % (11/27 0408) Weight:  [150 lb (68.04 kg)-165 lb 9.1 oz (75.1 kg)] 165 lb 9.1 oz (75.1 kg) (11/27 0115) HEMODYNAMICS:   VENTILATOR SETTINGS: Vent Mode:  [-] PRVC FiO2 (%):  [60 %-100 %] 60 % Set Rate:  [14 bmp] 14 bmp Vt Set:  [450 mL] 450 mL PEEP:  [5 cmH20] 5 cmH20 Plateau Pressure:  [16 cmH20-20 cmH20] 16 cmH20 INTAKE / OUTPUT: Intake/Output     11/26 0701 - 11/27 0700 11/27 0701 - 11/28 0700   I.V. (mL/kg) 409.9 (5.5)    IV Piggyback 183.6    Total Intake(mL/kg) 593.5 (7.9)    Urine (mL/kg/hr) 750    Total Output 750     Net -156.5           PHYSICAL EXAMINATION:  General:  WD/WN, intubated Neuro: Sedated, RASS -4,  HEENT: [place]/AT Cardiovascular: RRR, diminished heart sounds Lungs: ETT in place, coarse breath  sounds bilaterally Abdomen: Soft, NT, ND, obese, hypoactive bs Ext: warm, no edema, SCDs in place bilaterally  LABS:  CBC  Recent Labs Lab 05/18/13 2113 05/19/13 0230  WBC 14.2* 10.5  HGB 15.5* 12.2  HCT 47.7* 38.0  PLT 260 153   Coag's  Recent Labs Lab 05/19/13 0230  INR 1.51*   BMET  Recent Labs Lab 05/18/13 2113 05/19/13 0230  NA 141 138  K 3.6 4.2  CL 102 103  CO2 28 24  BUN 22 21  CREATININE 1.05 1.09  GLUCOSE 144* 123*   Electrolytes  Recent Labs Lab 05/18/13 2113 05/19/13 0230  CALCIUM 9.2 8.3*   Sepsis Markers No results found for this basename: LATICACIDVEN, PROCALCITON, O2SATVEN,  in the last 168 hours ABG  Recent Labs Lab 05/18/13 2249  PHART 7.305*  PCO2ART 51.9*  PO2ART 176.0*   Liver Enzymes No results found for this basename: AST, ALT, ALKPHOS, BILITOT, ALBUMIN,  in the last 168 hours Cardiac Enzymes No results found for this basename: TROPONINI, PROBNP,  in the last 168 hours Glucose  Recent Labs Lab 05/18/13 2046 05/19/13 0111 05/19/13 0417  GLUCAP 171* 131* 86    CXR: 11/27 Resolving pulm edema, residual left base atelectasis/infiltrate  ASSESSMENT / PLAN:  PULMONARY A: Acute respiratory distress, intubated Pulmonary edema (cardiogenic vs neurogenic) vs ALI /ARDS Former smoker on chronic bronchodilators (Ipratropium) P:  - Vent settings established. -  Hold weaning given mental status. - Vent adjusted for ABG. - Vent bundle ordered. - Empiric BDs ordered.  CARDIOVASCULAR A: Chronic atrial fibrillation, at home on xarelto S/P PPM (intermittent PM dependent) H/O Htn P:  - BP goals to be established by Neurology. - Holding xarelto.  RENAL A:  Mild renal insuff, uncertain chronicity P:   - Monitor BMET intermittently. - Correct electrolytes as indicated. - Avoid free water as at risk for cerebral edema.  GASTROINTESTINAL A:  No issues P:   - SUP: IV famotidine - Begin TFs AM 11/27 - nutrition cs  ordered  HEMATOLOGIC A:  No issues Chronic anticoagulation with Xarelto P:  - DVT px: SCDs - Monitor CBC intermittently - Anticoagulation reversal protocol ordered  INFECTIOUS A:  Possible aspiration PNA P:   - Micro and abx as above  ENDOCRINE A:  Mild hyperglycemia without prior dx of DM P:   - CBGs/SSI ordered  NEUROLOGIC A:  Acute L hemispheric intraparenchymal bleed with R hemiparesis P:   - Neuro/Stroke consulted - Checking A1c and lipid panel, Repeat CT head, MRI/MRA brain - PT/OT/SLP consulted - Echo, carotid dopplers - Sedation with propofol - Analgesia with fentanyl, not running currently - Daily WUA unless contraindicated   Leona Singleton, MD Family Practice PGY2 05/19/2013 8:47 AM  Will maintain current vent settings, will check imaging and once neuro is ready to make recommendations will need to speak with family.  CC time 35 min.  Patient seen and examined, agree with above note.  I dictated the care and orders written for this patient under my direction.  Alyson Reedy, MD [phone]

## 2013-05-20 ENCOUNTER — Ambulatory Visit: Payer: Medicare Other | Admitting: Internal Medicine

## 2013-05-20 ENCOUNTER — Inpatient Hospital Stay (HOSPITAL_COMMUNITY): Payer: Medicare Other

## 2013-05-20 ENCOUNTER — Other Ambulatory Visit: Payer: Self-pay | Admitting: Internal Medicine

## 2013-05-20 DIAGNOSIS — J189 Pneumonia, unspecified organism: Secondary | ICD-10-CM | POA: Diagnosis not present

## 2013-05-20 DIAGNOSIS — I059 Rheumatic mitral valve disease, unspecified: Secondary | ICD-10-CM

## 2013-05-20 DIAGNOSIS — J690 Pneumonitis due to inhalation of food and vomit: Secondary | ICD-10-CM | POA: Diagnosis not present

## 2013-05-20 DIAGNOSIS — I619 Nontraumatic intracerebral hemorrhage, unspecified: Secondary | ICD-10-CM | POA: Diagnosis not present

## 2013-05-20 DIAGNOSIS — I6350 Cerebral infarction due to unspecified occlusion or stenosis of unspecified cerebral artery: Secondary | ICD-10-CM

## 2013-05-20 DIAGNOSIS — I4891 Unspecified atrial fibrillation: Secondary | ICD-10-CM | POA: Diagnosis not present

## 2013-05-20 DIAGNOSIS — J9600 Acute respiratory failure, unspecified whether with hypoxia or hypercapnia: Secondary | ICD-10-CM | POA: Diagnosis not present

## 2013-05-20 LAB — CBC
HCT: 36.8 % (ref 36.0–46.0)
MCHC: 32.9 g/dL (ref 30.0–36.0)
Platelets: 128 10*3/uL — ABNORMAL LOW (ref 150–400)
RBC: 4.24 MIL/uL (ref 3.87–5.11)
RDW: 15.1 % (ref 11.5–15.5)
WBC: 7.2 10*3/uL (ref 4.0–10.5)

## 2013-05-20 LAB — BASIC METABOLIC PANEL
BUN: 14 mg/dL (ref 6–23)
CO2: 24 mEq/L (ref 19–32)
Chloride: 103 mEq/L (ref 96–112)
GFR calc Af Amer: 89 mL/min — ABNORMAL LOW (ref >90)
GFR calc non Af Amer: 77 mL/min — ABNORMAL LOW (ref >90)
Potassium: 3.7 mEq/L (ref 3.5–5.1)
Sodium: 138 mEq/L (ref 135–145)

## 2013-05-20 LAB — GLUCOSE, CAPILLARY
Glucose-Capillary: 101 mg/dL — ABNORMAL HIGH (ref 70–99)
Glucose-Capillary: 105 mg/dL — ABNORMAL HIGH (ref 70–99)

## 2013-05-20 LAB — BLOOD GAS, ARTERIAL
Acid-Base Excess: 0.5 mmol/L (ref 0.0–2.0)
Bicarbonate: 25.2 mEq/L — ABNORMAL HIGH (ref 20.0–24.0)
FIO2: 30 %
MECHVT: 450 mL
O2 Saturation: 95.4 %
Patient temperature: 99.4
TCO2: 26.6 mmol/L (ref 0–100)

## 2013-05-20 NOTE — Progress Notes (Signed)
Utilization Review Completed.Thedora Rings T11/28/2014  

## 2013-05-20 NOTE — Progress Notes (Signed)
Stroke Team Progress Note  HISTORY  Ann Kaufman is an 79 y.o. female with a history of atrial fibrillation and pacemaker placement, anticoagulation on Xarelto, and hypertension who was brought to the emergency room after developing speech difficulty and right facial droop as well as seizure activity. She was noted to have focal motor seizure activity on arrival in the emergency room in addition to being unresponsive. She was intubated for airway protection following administration of 2 mg of Ativan which stopped seizure activity. Propofol drip was started for sedation as well as seizure management. CT scan of her head showed a 3.6 cm left frontal intraparenchymal hematoma. There was evidence of left lateral ventricular extension. No signs of hydrocephalus were seen. A loading dose of Keppra was given, 1000 mg. Anticoagulation reversing protocol was also ordered.  LSN: 6:30 PM on 05/18/2013  tPA Given: No: Acute intraparenchymal hemorrhage.  MRankin: 4   Patient was not a TPA candidate secondary to ICH.   SUBJECTIVE No family is at bedside. The patient is alert, cooperative, slightly confused.  OBJECTIVE Most recent Vital Signs: Filed Vitals:   05/20/13 0757 05/20/13 0800 05/20/13 0900 05/20/13 1000  BP:  137/109  110/39  Pulse: 63 64 88 63  Temp:      TempSrc:      Resp: 15 22 21 17   Height:      Weight:      SpO2: 100% 98% 100% 100%   CBG (last 3)   Recent Labs  05/20/13 05/20/13 0416 05/20/13 0749  GLUCAP 110* 101* 105*    IV Fluid Intake:   . 0.9 % NaCl with KCl 20 mEq / L 50 mL/hr at 05/19/13 2159  . propofol Stopped (05/20/13 0845)    MEDICATIONS  . albuterol  2.5 mg Nebulization Q6H  . amiodarone  200 mg Per Tube Daily  . ampicillin-sulbactam (UNASYN) IV  1.5 g Intravenous Q8H  . antiseptic oral rinse  15 mL Mouth Rinse q12n4p  . chlorhexidine  15 mL Mouth Rinse BID  . famotidine (PEPCID) IV  20 mg Intravenous Q12H  . insulin aspart  0-9 Units Subcutaneous Q4H   . ipratropium  0.5 mg Nebulization Q6H  . levETIRAcetam  500 mg Intravenous Q12H   PRN:  sodium chloride, acetaminophen (TYLENOL) oral liquid 160 mg/5 mL, albuterol, fentaNYL  Diet:   NPO Activity:  Bedrest DVT Prophylaxis:  SCD  CLINICALLY SIGNIFICANT STUDIES Basic Metabolic Panel:   Recent Labs Lab 05/19/13 0230 05/20/13 0322  NA 138 138  K 4.2 3.7  CL 103 103  CO2 24 24  GLUCOSE 123* 117*  BUN 21 14  CREATININE 1.09 0.79  CALCIUM 8.3* 8.4   Liver Function Tests: No results found for this basename: AST, ALT, ALKPHOS, BILITOT, PROT, ALBUMIN,  in the last 168 hours CBC:   Recent Labs Lab 05/19/13 0230 05/20/13 0322  WBC 10.5 7.2  HGB 12.2 12.1  HCT 38.0 36.8  MCV 87.4 86.8  PLT 153 128*   Coagulation:   Recent Labs Lab 05/19/13 0230 05/19/13 0820 05/19/13 1345  LABPROT 17.8* 18.4* 16.6*  INR 1.51* 1.58* 1.38   Cardiac Enzymes: No results found for this basename: CKTOTAL, CKMB, CKMBINDEX, TROPONINI,  in the last 168 hours Urinalysis:   Recent Labs Lab 05/19/13 0135  COLORURINE YELLOW  LABSPEC 1.011  PHURINE 5.5  GLUCOSEU NEGATIVE  HGBUR NEGATIVE  BILIRUBINUR NEGATIVE  KETONESUR NEGATIVE  PROTEINUR 30*  UROBILINOGEN 0.2  NITRITE NEGATIVE  LEUKOCYTESUR NEGATIVE   Lipid Panel  No results found for this basename: chol,  trig,  hdl,  cholhdl,  vldl,  ldlcalc   HgbA1C  Lab Results  Component Value Date   HGBA1C 5.9* 05/19/2013    Urine Drug Screen:   No results found for this basename: labopia,  cocainscrnur,  labbenz,  amphetmu,  thcu,  labbarb    Alcohol Level: No results found for this basename: ETH,  in the last 168 hours  Ct Head Wo Contrast  05/19/2013   CLINICAL DATA:  Intracranial hemorrhage follow-up.  EXAM: CT HEAD WITHOUT CONTRAST  TECHNIQUE: Contiguous axial images were obtained from the base of the skull through the vertex without intravenous contrast.  COMPARISON:  05/18/2013.  FINDINGS: Left frontal lobe hematoma with maximal  transverse dimension of 4 x 3.8 cm similar to prior exam. Mild surrounding vasogenic edema. Breakthrough of hemorrhage into the left lateral ventricle. Decrease in amount of blood within the left lateral ventricle now with spread of intraventricular blood within the dependent aspect of both ventricles. Interval development of subarachnoid blood most notable in the posterior sylvian fissure in greater on the left.  The cause of the left frontal lobe hematoma is indeterminate. Although it is possible this is related to hemorrhagic transformation of an infarct as question on the prior examination, it is also possible that this represents result of amyloid angiography. Underlying mass or vascular malformation cannot be excluded. This will need to be followed until clear to evaluate for possibility of underlying mass or vascular malformation. No history of trauma to suggest this represents result of Injury. No obvious skull fracture.  IMPRESSION: Left frontal lobe hematoma and surrounding vasogenic edema relatively similar to prior exam.  Change in appearance of intraventricular blood, less notable within the left ventricle and now visualized within the right ventricle.  Subarachnoid blood now noted most prominent posterior sylvian fissure.  Please see above.  This is a call report.   Electronically Signed   By: Bridgett Larsson M.D.   On: 05/19/2013 11:36   Ct Head Wo Contrast   (if New Onset Seizure And/or Head Trauma)  05/18/2013   CLINICAL DATA:  Unresponsive  EXAM: CT HEAD WITHOUT CONTRAST  TECHNIQUE: Contiguous axial images were obtained from the base of the skull through the vertex without intravenous contrast.  COMPARISON:  None.  FINDINGS: A left frontal intraparenchymal hematoma measuring 3.6 x 3.8 cm is seen (series 2, image 16). This hematoma involves the overlying cortical gray matter as well as the subcortical white matter, and likely the left caudate head. There is a small amount of localized edema.  Hyperdense intraventricular hemorrhage is seen within the left lateral ventricle as well (series 2, image 18). No significant midline shift identified at this time. There is no hydrocephalus. No acute intracranial hemorrhage seen within the right cerebral hemisphere. There is no extra-axial fluid collection.  Mild atrophy with chronic microvascular ischemic changes are present. No mass lesion identified. The calvarium is intact.  Paranasal sinuses and mastoid air cells are clear.  Endotracheal tube is partially visualized.  IMPRESSION: 1. Left frontal lobe intraparenchymal hematoma with intraventricular hemorrhage within the left lateral ventricle as above. Hemorrhagic infarct with intraventricular extension is suspected. No midline shift or hydrocephalus identified. 2. Atrophy with chronic microvascular ischemic changes.  Critical Value/emergent results were called by telephone at the time of interpretation on 05/18/2013 at 11:03 PM to Dr.DANIELLE RAY , who verbally acknowledged these results.   Electronically Signed   By: Rise Mu M.D.   On:  05/18/2013 23:08   Dg Chest Portable 1 View  05/20/2013   CLINICAL DATA:  Intubation.  EXAM: PORTABLE CHEST - 1 VIEW  COMPARISON:  05/19/2013.  05/18/2013.  FINDINGS: Endotracheal tube in good anatomic position 3 cm above the carina. NG tube noted in good position. Cardiac pacer noted with lead tips in the right atrium and right ventricle. Previously identified right upper lobe infiltrate has largely cleared. Poor lung volumes with basilar atelectasis versus basilar pneumonia. No pleural effusion or pneumothorax. No acute osseous abnormality.  IMPRESSION: 1. Support lines in good anatomic position. 2. Interim clearing of right upper lobe pneumonia. Poor lung volumes with bibasilar atelectasis versus bibasilar pneumonia noted on today's exam.   Electronically Signed   By: Maisie Fus  Register   On: 05/20/2013 07:34   Dg Chest Port 1 View  05/19/2013   CLINICAL  DATA:  Endotracheal tube evaluation.  EXAM: PORTABLE CHEST - 1 VIEW  COMPARISON:  05/18/2013.  FINDINGS: Endotracheal tube tip is a 2.2 cm above the carina.  Nasogastric tube courses below the diaphragm. Tip is not included on the present exam.  Sequential pacemaker enters from the left with leads unchanged.  Heart is slightly enlarged.  Decreased in degree although incomplete clearance of asymmetric airspace disease. This may represent resolving pulmonary edema. Infectious infiltrate not excluded in the proper clinical setting. Residual left base atelectasis/ infiltrate.  No gross pneumothorax.  Calcified aorta.  IMPRESSION: Decreased in degree although incomplete clearance of asymmetric airspace disease. This may represent resolving pulmonary edema. Infectious infiltrate not excluded in the proper clinical setting. Residual left base atelectasis/ infiltrate.  Endotracheal tube tip 2.2 cm above the carina.   Electronically Signed   By: Bridgett Larsson M.D.   On: 05/19/2013 08:04   Dg Chest Portable 1 View  05/18/2013   CLINICAL DATA:  Post intubation.  EXAM: PORTABLE CHEST - 1 VIEW  COMPARISON:  CT chest and chest radiograph 11/26/2011.  FINDINGS: Endotracheal tube terminates 3.1 cm above the carina. Nasogastric tube is followed into the stomach. Left subclavian pacemaker lead tips project over the right atrium and right ventricle. Heart size normal. There is upper and midlung zone predominant airspace disease. No pleural fluid.  IMPRESSION: 1. Satisfactory endotracheal and nasogastric tube placements. 2. Bilateral airspace disease may be due to pneumonia. Noncardiogenic edema is another consideration.   Electronically Signed   By: Leanna Battles M.D.   On: 05/18/2013 21:25    CT of the brain   IMPRESSION:  1. Left frontal lobe intraparenchymal hematoma with intraventricular  hemorrhage within the left lateral ventricle as above. Hemorrhagic  infarct with intraventricular extension is suspected. No midline   shift or hydrocephalus identified.  2. Atrophy with chronic microvascular ischemic changes.  CT 05/19/13  IMPRESSION:  Left frontal lobe hematoma and surrounding vasogenic edema  relatively similar to prior exam.  Change in appearance of intraventricular blood, less notable within  the left ventricle and now visualized within the right ventricle.  Subarachnoid blood now noted most prominent posterior sylvian  fissure.   MRI of the brain    MRA of the brain    2D Echocardiogram    Carotid Doppler   Vascular Ultrasound  Carotid Duplex (Doppler) has been completed.  Findings suggest 1-39% internal carotid artery stenosis bilaterally. Vertebral arteries are patent with antegrade flow.     CXR   IMPRESSION:  1. Satisfactory endotracheal and nasogastric tube placements.  2. Bilateral airspace disease may be due to pneumonia.  Noncardiogenic edema is  another consideration.   EKG   Sinus rhythm Borderline prolonged PR interval Probable left atrial enlargement Incomplete left bundle branch block LVH with secondary repolarization abnormality Borderline prolonged QT interval  Therapy Recommendations Pending  Physical Exam  General: The patient is extubated, alert. The patient is oriented to person, place, not date.  Skin: No significant peripheral edema is noted.   Neurologic Exam  Mental status: The patient is unable to follow commands.  Cranial nerves: Facial symmetry is not present. The left nasolabial fold is depressed, asymmetric smile. Eyes are mid position, minimal dolls. VF are full, restriction of superior gaze.  Motor: The patient is able to move the arms and legs symmetrically, with good strength.  Sensory examination: The patient has good soft touch sensation on all 4's.  Coordination: The patient has good finger-nose-finger and toe-to-finger bilaterally.  Gait and station: The gait could not be tested.  Reflexes: Deep tendon reflexes are  symmetric.    ASSESSMENT Ann Kaufman is a 79 y.o. female presenting with a left frontal intracranial hemorrhage. The patient was on Xarelto prior to admission. The patient is off of antiplatelet medications and anticoagulants at this time. Supportive care. FEIBA protocol. Seizure at the time of presentation. The patient is on Keppra.   Anticoagulation on admission  Atrial fibrillation  Hypertension  Pacemaker placement  History of pericardial patient  Depression  Hospital day # 2  followup CT of the head showed some resolution of the intraventricular blood, no hydrocephalus  TREATMENT/PLAN   Supportive care  2-D echocardiogram  Carotid Doppler study  Physical, occupational therapies to evaluate, the patient should be a good rehab candidate  Critical care medicine is managing  ST to see for swallowing evaluation   Ann Kaufman  05/20/2013 10:38 AM

## 2013-05-20 NOTE — Progress Notes (Signed)
OT Cancellation Note  Patient Details Name: Ann Kaufman MRN: [id number] DOB: 07-26-1929   Cancelled Treatment:    Reason Eval/Treat Not Completed: Patient at procedure or test/ unavailable (respiratory in room) pt with recent extubation this AM. OT to continue to follow acutely.  Harolyn Rutherford Pager: [phone]  05/20/2013, 3:39 PM

## 2013-05-20 NOTE — Evaluation (Addendum)
Physical Therapy Evaluation Patient Details Name: Ann Kaufman MRN: [id number] DOB: 10/09/1929 Today's Date: 05/20/2013 Time: 4098-1191 PT Time Calculation (min): 38 min  PT Assessment / Plan / Recommendation History of Present Illness  Brought by EMS to [facility] ED as code stroke and seizure. Intubated due to AMS with compromised airway and resp distress. CT head revealed Lt frontal ICH. PMH of CAF, chronic anticoagulation. Lives independently @ baseline.    Clinical Impression  Pt admitted with above. Pt currently with functional limitations due to the deficits listed below (see PT Problem List).  Pt will benefit from skilled PT to increase their independence and safety with mobility to allow discharge to the venue listed below.       PT Assessment  Patient needs continued PT services    Follow Up Recommendations  CIR    Does the patient have the potential to tolerate intense rehabilitation      Barriers to Discharge Decreased caregiver support 1 daughter lives in [place] (works full-time with 2 kids); 2 children in [place]    Equipment Recommendations  None recommended by PT    Recommendations for Other Services Rehab consult;OT consult;Speech consult   Frequency Min 4X/week    Precautions / Restrictions Precautions Precautions: Fall   Pertinent Vitals/Pain BP supine 126/55       Sit   128/60       After stand 120/68 (pt was dizzy when standing) SaO2 100% on 4L; on RA 97%  (RN in and okayed leaving O2 off)      Mobility  Bed Mobility Bed Mobility: Rolling Left;Left Sidelying to Sit;Sitting - Scoot to Edge of Bed Rolling Left: 5: Supervision Left Sidelying to Sit: 4: Min guard Sitting - Scoot to [place] of Bed: 4: Min guard Details for Bed Mobility Assistance: minguard for multiple lines (ICU) and for safety due to dizziness Transfers Transfers: Sit to Stand;Stand to [employer] Transfers Sit to Stand: 4: Min assist Stand to Sit: 4: Min assist Stand Pivot Transfers: 3:  Mod assist Details for Transfer Assistance: pt overshooting the seat of the chair (on her right) and nearly sitting on farthest armrest. assist for balance and positioning with reference to the chair Ambulation/Gait Ambulation/Gait Assistance: Not tested (comment) Modified Rankin (Stroke Patients Only) Pre-Morbid Rankin Score: No symptoms Modified Rankin: Moderately severe disability    Exercises General Exercises - Lower Extremity Ankle Circles/Pumps: AROM;Both;15 reps;Supine   PT Diagnosis: Difficulty walking  PT Problem List: Decreased activity tolerance;Decreased balance;Decreased mobility;Decreased knowledge of use of DME;Cardiopulmonary status limiting activity;Decreased cognition PT Treatment Interventions: DME instruction;Gait training;Functional mobility training;Therapeutic activities;Therapeutic exercise;Balance training;Neuromuscular re-education;Cognitive remediation;Patient/family education     PT Goals(Current goals can be found in the care plan section) Acute Rehab PT Goals Patient Stated Goal: unable due to language deficits PT Goal Formulation: With patient/family Time For Goal Achievement: 05/27/13 Potential to Achieve Goals: Good Additional Goals Additional Goal #1: Pt will tolerate completing either the Berg Balance Assessment or DGI to further assess her balance and fall risk  Visit Information  Last PT Received On: 05/20/13 Assistance Needed: +1 History of Present Illness: Brought by EMS to [facility] ED as code stroke and seizure. Intubated due to AMS with compromised airway and resp distress. CT head revealed Lt frontal ICH. PMH of CAF, chronic anticoagulation. Lives independently @ baseline.        Prior Functioning  Home Living Family/patient expects to be discharged to:: Inpatient rehab Living Arrangements: Alone Prior Function Level of Independence: Independent Communication Communication: Expressive  difficulties    Cognition   Cognition Arousal/Alertness:  (awake/fatigued) Behavior During Therapy: WFL for tasks assessed/performed Overall Cognitive Status: Difficult to assess Area of Impairment: Orientation;Awareness Orientation Level: Place;Time;Situation Memory: Decreased short-term memory Awareness:  (not yet at intellectual level) General Comments: pt educated on [facility] hospital due to a stroke; pt unable to recall information immediately (?expressive language issue vs memory); with 2 choices could pick the correct answer for location, situation Difficult to assess due to: Impaired communication    Extremity/Trunk Assessment Upper Extremity Assessment Upper Extremity Assessment: Overall WFL for tasks assessed (grossly assessed) Lower Extremity Assessment Lower Extremity Assessment: Overall WFL for tasks assessed Cervical / Trunk Assessment Cervical / Trunk Assessment: Normal   Balance Balance Balance Assessed: Yes Static Sitting Balance Static Sitting - Balance Support: No upper extremity supported;Feet supported Static Sitting - Level of Assistance: 5: Stand by assistance Static Sitting - Comment/# of Minutes: 4 Static Standing Balance Static Standing - Balance Support: No upper extremity supported Static Standing - Level of Assistance: 4: Min assist Static Standing - Comment/# of Minutes: 30 seconds x 2; dizziness  End of Session PT - End of Session Equipment Utilized During Treatment: Gait belt Activity Tolerance: Patient tolerated treatment well Patient left: in chair;with call bell/phone within reach;with family/visitor present Nurse Communication: Mobility status;Other (comment) (sats 97% on RA)  GP     Ann Kaufman 05/20/2013, 6:26 PM Pager [phone]

## 2013-05-20 NOTE — Progress Notes (Signed)
SLP Cancellation Note  Patient Details Name: Ann Kaufman MRN: [id number] DOB: [birth year]   Cancelled treatment:        Pt. Extubated this morning around 10:15.  RN states she is very sleepy.  Pt. Would likely benefit from holding swallow assessment until tomorrow morning.   Breck Coons [place].Ed [employer] [phone]  05/20/2013

## 2013-05-20 NOTE — Progress Notes (Signed)
Echocardiogram 2D Echocardiogram has been performed.  [name] 05/20/2013, 11:28 AM

## 2013-05-20 NOTE — Progress Notes (Signed)
PULMONARY  / CRITICAL CARE MEDICINE  Name: Ann Kaufman MRN: [id number] DOB: 1930/05/29    ADMISSION DATE:  05/18/2013  REFERRING MD :  EDP PRIMARY SERVICE: PCCM  BRIEF PATIENT DESCRIPTION:  18 F with PMH of CAF, chronic anticoagulation. Lives independently @ baseline. Brought by EMS to [facility] ED as code stroke and seizure. Intubated due to AMS with compromised airway and resp distress. CT head revealed ICH.   SIGNIFICANT EVENTS / STUDIES:  11/26 CT head: Left frontal lobe intraparenchymal hematoma with intraventricular hemorrhage within the left lateral ventricle as above. Hemorrhagic infarct with intraventricular extension is suspected. No midline shift or hydrocephalus identified  11/26 Admission CXR: diffuse infiltrates/edema pattern  LINES / TUBES: ETT 11/26 >>11/28  CULTURES: Resp 11/26 >>poly microbial  ANTIBIOTICS: Amp-sulbactam 11/26 >>   SUBJECTIVE: Arousable and following simple commands.  VITAL SIGNS: Temp:  [98.6 F (37 C)-100.9 F (38.3 C)] 98.9 F (37.2 C) (11/28 0750) Pulse Rate:  [59-88] 63 (11/28 1000) Resp:  [12-22] 17 (11/28 1000) BP: (90-143)/(35-109) 110/39 mmHg (11/28 1000) SpO2:  [97 %-100 %] 100 % (11/28 1000) FiO2 (%):  [30 %-40 %] 30 % (11/28 0757) Weight:  [75.8 kg (167 lb 1.7 oz)] 75.8 kg (167 lb 1.7 oz) (11/28 0424) HEMODYNAMICS:   VENTILATOR SETTINGS: Vent Mode:  [-] PRVC FiO2 (%):  [30 %-40 %] 30 % Set Rate:  [14 bmp] 14 bmp Vt Set:  [450 mL] 450 mL PEEP:  [5 cmH20] 5 cmH20 Plateau Pressure:  [15 cmH20-18 cmH20] 16 cmH20 INTAKE / OUTPUT: Intake/Output     11/27 0701 - 11/28 0700 11/28 0701 - 11/29 0700   I.V. (mL/kg) 1500.7 (19.8) 171.1 (2.3)   NG/GT 40    IV Piggyback 410    Total Intake(mL/kg) 1950.7 (25.7) 171.1 (2.3)   Urine (mL/kg/hr) 885 (0.5) 100 (0.3)   Total Output 885 100   Net +1065.7 +71.1         PHYSICAL EXAMINATION:  General:  WD/WN, intubated Neuro: Arousable and following commands HEENT:  [place]/AT Cardiovascular: RRR, diminished heart sounds Lungs: ETT in place, coarse breath sounds bilaterally Abdomen: Soft, NT, ND, obese, hypoactive bs Ext: warm, no edema, SCDs in place bilaterally  LABS:  CBC  Recent Labs Lab 05/18/13 2113 05/19/13 0230 05/20/13 0322  WBC 14.2* 10.5 7.2  HGB 15.5* 12.2 12.1  HCT 47.7* 38.0 36.8  PLT 260 153 128*   Coag's  Recent Labs Lab 05/19/13 0230 05/19/13 0820 05/19/13 1345  INR 1.51* 1.58* 1.38   BMET  Recent Labs Lab 05/18/13 2113 05/19/13 0230 05/20/13 0322  NA 141 138 138  K 3.6 4.2 3.7  CL 102 103 103  CO2 28 24 24   BUN 22 21 14   CREATININE 1.05 1.09 0.79  GLUCOSE 144* 123* 117*   Electrolytes  Recent Labs Lab 05/18/13 2113 05/19/13 0230 05/20/13 0322  CALCIUM 9.2 8.3* 8.4   Sepsis Markers No results found for this basename: LATICACIDVEN, PROCALCITON, O2SATVEN,  in the last 168 hours ABG  Recent Labs Lab 05/18/13 2249 05/19/13 0958 05/20/13 0336  PHART 7.305* 7.372 7.359  PCO2ART 51.9* 46.1* 46.1*  PO2ART 176.0* 112.0* 85.5   Liver Enzymes No results found for this basename: AST, ALT, ALKPHOS, BILITOT, ALBUMIN,  in the last 168 hours Cardiac Enzymes No results found for this basename: TROPONINI, PROBNP,  in the last 168 hours Glucose  Recent Labs Lab 05/19/13 1209 05/19/13 1537 05/19/13 1955 05/20/13 05/20/13 0416 05/20/13 0749  GLUCAP 93 99 92 110* 101*  105*   CXR: 11/27 Resolving pulm edema, residual left base atelectasis/infiltrate  ASSESSMENT / PLAN:  PULMONARY A: Acute respiratory distress, intubated Pulmonary edema (cardiogenic vs neurogenic) vs ALI /ARDS Former smoker on chronic bronchodilators (Ipratropium) P:  - Extubate. - Titrate O2. - Early mobility. - IS per RT protocol. - Empiric BDs ordered.  CARDIOVASCULAR A: Chronic atrial fibrillation, at home on xarelto S/P PPM (intermittent PM dependent) H/O Htn P:  - BP goals to be established by Neurology. - Holding  xarelto.  RENAL A:  Mild renal insuff, uncertain chronicity P:   - Monitor BMET intermittently. - Correct electrolytes as indicated. - Avoid free water as at risk for cerebral edema.  GASTROINTESTINAL A:  No issues P:   - SUP: IV famotidine - Swallow evaluation.  HEMATOLOGIC A:  No issues Chronic anticoagulation with Xarelto P:  - DVT px: SCDs - Monitor CBC intermittently - Anticoagulation reversal protocol ordered  INFECTIOUS A:  Possible aspiration PNA P:   - Micro and abx as above  ENDOCRINE A:  Mild hyperglycemia without prior dx of DM P:   - CBGs/SSI ordered  NEUROLOGIC A:  Acute L hemispheric intraparenchymal bleed with R hemiparesis P:   - Neuro/Stroke following - Checking A1c and lipid panel, Repeat CT head, MRI/MRA brain - PT/OT/SLP consulted - Echo, carotid dopplers per neuro - D/C sedation - Daily WUA unless contraindicated   Extubate, early mobilization, monitor fairway, swallow evaluation, need to address code status but no family bedside.  CC time 35 min.  Alyson Reedy, M.D. [facility] Pulmonary/Critical Care Medicine. Pager: [phone]. After hours pager: [phone].

## 2013-05-20 NOTE — Progress Notes (Signed)
*  PRELIMINARY RESULTS* Vascular Ultrasound Carotid Duplex (Doppler) has been completed.  Findings suggest 1-39% internal carotid artery stenosis bilaterally. Vertebral arteries are patent with antegrade flow.  05/20/2013 9:19 AM Gertie Fey, RVT, RDCS, RDMS

## 2013-05-20 NOTE — Progress Notes (Signed)
Nutrition Consult--Brief Note  Received MD Consult for TF initiation and management. Patient has since been extubated (this morning). No plans to start TF at this time per discussion with RN. Patient reports no weight loss PTA. MST score = 0. No nutrition intervention needed at this time. Please consult RD if further nutrition concerns arise.   Joaquin Courts, RD, LDN, CNSC Pager [phone] After Hours Pager [phone]

## 2013-05-20 NOTE — Progress Notes (Signed)
MD said to extubate. Patient self extubated at 0905. Patient placed on 4lnc. HR, RR, and sat normal. No distress. RT will continue to monitor.

## 2013-05-21 DIAGNOSIS — J9600 Acute respiratory failure, unspecified whether with hypoxia or hypercapnia: Secondary | ICD-10-CM | POA: Diagnosis not present

## 2013-05-21 DIAGNOSIS — J90 Pleural effusion, not elsewhere classified: Secondary | ICD-10-CM

## 2013-05-21 DIAGNOSIS — E876 Hypokalemia: Secondary | ICD-10-CM

## 2013-05-21 DIAGNOSIS — I619 Nontraumatic intracerebral hemorrhage, unspecified: Secondary | ICD-10-CM | POA: Diagnosis not present

## 2013-05-21 DIAGNOSIS — J690 Pneumonitis due to inhalation of food and vomit: Secondary | ICD-10-CM | POA: Diagnosis not present

## 2013-05-21 LAB — CBC
HCT: 31.9 % — ABNORMAL LOW (ref 36.0–46.0)
MCH: 27.8 pg (ref 26.0–34.0)
MCV: 86 fL (ref 78.0–100.0)
Platelets: 124 10*3/uL — ABNORMAL LOW (ref 150–400)
RBC: 3.71 MIL/uL — ABNORMAL LOW (ref 3.87–5.11)
RDW: 15 % (ref 11.5–15.5)
WBC: 6.2 10*3/uL (ref 4.0–10.5)

## 2013-05-21 LAB — GLUCOSE, CAPILLARY
Glucose-Capillary: 118 mg/dL — ABNORMAL HIGH (ref 70–99)
Glucose-Capillary: 104 mg/dL — ABNORMAL HIGH (ref 70–99)

## 2013-05-21 LAB — BASIC METABOLIC PANEL
BUN: 10 mg/dL (ref 6–23)
CO2: 27 mEq/L (ref 19–32)
Calcium: 8.2 mg/dL — ABNORMAL LOW (ref 8.4–10.5)
Chloride: 106 mEq/L (ref 96–112)
Creatinine, Ser: 0.64 mg/dL (ref 0.50–1.10)
GFR calc Af Amer: >90 mL/min (ref >90)

## 2013-05-21 LAB — CULTURE, RESPIRATORY W GRAM STAIN
Culture: NORMAL
Gram Stain: NONE SEEN

## 2013-05-21 MED ORDER — POTASSIUM CHLORIDE CRYS ER 20 MEQ PO TBCR
40.0000 meq | EXTENDED_RELEASE_TABLET | Freq: Three times a day (TID) | ORAL | Status: AC
  Administered 2013-05-21 (×2): 40 meq via ORAL
  Filled 2013-05-21 (×2): qty 2

## 2013-05-21 NOTE — Progress Notes (Signed)
Stroke Team Progress Note  HISTORY  Ann Kaufman is an 79 y.o. female with a history of atrial fibrillation and pacemaker placement, anticoagulation on Xarelto, and hypertension who was brought to the emergency room after developing speech difficulty and right facial droop as well as seizure activity. She was noted to have focal motor seizure activity on arrival in the emergency room in addition to being unresponsive. She was intubated for airway protection following administration of 2 mg of Ativan which stopped seizure activity. Propofol drip was started for sedation as well as seizure management. CT scan of her head showed a 3.6 cm left frontal intraparenchymal hematoma. There was evidence of left lateral ventricular extension. No signs of hydrocephalus were seen. A loading dose of Keppra was given, 1000 mg. Anticoagulation reversing protocol was also ordered.  LSN: 6:30 PM on 05/18/2013  tPA Given: No: Acute intraparenchymal hemorrhage.  MRankin: 4   Patient was not a TPA candidate secondary to ICH.   SUBJECTIVE No family is at bedside. The patient is alert, cooperative, slightly confused.  OBJECTIVE Most recent Vital Signs: Filed Vitals:   05/21/13 0801 05/21/13 0900 05/21/13 0922 05/21/13 1000  BP:  120/57  116/62  Pulse:  59  64  Temp: 98.9 F (37.2 C)     TempSrc: Oral     Resp:  17  18  Height:      Weight:      SpO2:  99% 98% 92%   CBG (last 3)   Recent Labs  05/21/13 0012 05/21/13 0447 05/21/13 0740  GLUCAP 112* 104* 118*    IV Fluid Intake:      MEDICATIONS  . albuterol  2.5 mg Nebulization Q6H  . amiodarone  200 mg Per Tube Daily  . ampicillin-sulbactam (UNASYN) IV  1.5 g Intravenous Q8H  . antiseptic oral rinse  15 mL Mouth Rinse q12n4p  . chlorhexidine  15 mL Mouth Rinse BID  . famotidine (PEPCID) IV  20 mg Intravenous Q12H  . insulin aspart  0-9 Units Subcutaneous Q4H  . ipratropium  0.5 mg Nebulization Q6H  . levETIRAcetam  500 mg Intravenous Q12H   . potassium chloride  40 mEq Oral TID   PRN:  sodium chloride, acetaminophen (TYLENOL) oral liquid 160 mg/5 mL, albuterol, fentaNYL  Diet:   NPO Activity:  Bedrest DVT Prophylaxis:  SCD  CLINICALLY SIGNIFICANT STUDIES Basic Metabolic Panel:   Recent Labs Lab 05/20/13 0322 05/21/13 0740  NA 138 141  K 3.7 3.3*  CL 103 106  CO2 24 27  GLUCOSE 117* 114*  BUN 14 10  CREATININE 0.79 0.64  CALCIUM 8.4 8.2*  MG  --  2.2  PHOS  --  2.1*   Liver Function Tests: No results found for this basename: AST, ALT, ALKPHOS, BILITOT, PROT, ALBUMIN,  in the last 168 hours CBC:   Recent Labs Lab 05/20/13 0322 05/21/13 0740  WBC 7.2 6.2  HGB 12.1 10.3*  HCT 36.8 31.9*  MCV 86.8 86.0  PLT 128* 124*   Coagulation:   Recent Labs Lab 05/19/13 0230 05/19/13 0820 05/19/13 1345  LABPROT 17.8* 18.4* 16.6*  INR 1.51* 1.58* 1.38   Cardiac Enzymes: No results found for this basename: CKTOTAL, CKMB, CKMBINDEX, TROPONINI,  in the last 168 hours Urinalysis:   Recent Labs Lab 05/19/13 0135  COLORURINE YELLOW  LABSPEC 1.011  PHURINE 5.5  GLUCOSEU NEGATIVE  HGBUR NEGATIVE  BILIRUBINUR NEGATIVE  KETONESUR NEGATIVE  PROTEINUR 30*  UROBILINOGEN 0.2  NITRITE NEGATIVE  LEUKOCYTESUR NEGATIVE  Lipid Panel No results found for this basename: chol,  trig,  hdl,  cholhdl,  vldl,  ldlcalc   HgbA1C  Lab Results  Component Value Date   HGBA1C 5.9* 05/19/2013    Urine Drug Screen:   No results found for this basename: labopia,  cocainscrnur,  labbenz,  amphetmu,  thcu,  labbarb    Alcohol Level: No results found for this basename: ETH,  in the last 168 hours  Ct Head Wo Contrast  05/19/2013   CLINICAL DATA:  Intracranial hemorrhage follow-up.  EXAM: CT HEAD WITHOUT CONTRAST  TECHNIQUE: Contiguous axial images were obtained from the base of the skull through the vertex without intravenous contrast.  COMPARISON:  05/18/2013.  FINDINGS: Left frontal lobe hematoma with maximal transverse  dimension of 4 x 3.8 cm similar to prior exam. Mild surrounding vasogenic edema. Breakthrough of hemorrhage into the left lateral ventricle. Decrease in amount of blood within the left lateral ventricle now with spread of intraventricular blood within the dependent aspect of both ventricles. Interval development of subarachnoid blood most notable in the posterior sylvian fissure in greater on the left.  The cause of the left frontal lobe hematoma is indeterminate. Although it is possible this is related to hemorrhagic transformation of an infarct as question on the prior examination, it is also possible that this represents result of amyloid angiography. Underlying mass or vascular malformation cannot be excluded. This will need to be followed until clear to evaluate for possibility of underlying mass or vascular malformation. No history of trauma to suggest this represents result of Injury. No obvious skull fracture.  IMPRESSION: Left frontal lobe hematoma and surrounding vasogenic edema relatively similar to prior exam.  Change in appearance of intraventricular blood, less notable within the left ventricle and now visualized within the right ventricle.  Subarachnoid blood now noted most prominent posterior sylvian fissure.  Please see above.  This is a call report.   Electronically Signed   By: Bridgett Larsson M.D.   On: 05/19/2013 11:36   Dg Chest Portable 1 View  05/20/2013   CLINICAL DATA:  Intubation.  EXAM: PORTABLE CHEST - 1 VIEW  COMPARISON:  05/19/2013.  05/18/2013.  FINDINGS: Endotracheal tube in good anatomic position 3 cm above the carina. NG tube noted in good position. Cardiac pacer noted with lead tips in the right atrium and right ventricle. Previously identified right upper lobe infiltrate has largely cleared. Poor lung volumes with basilar atelectasis versus basilar pneumonia. No pleural effusion or pneumothorax. No acute osseous abnormality.  IMPRESSION: 1. Support lines in good anatomic  position. 2. Interim clearing of right upper lobe pneumonia. Poor lung volumes with bibasilar atelectasis versus bibasilar pneumonia noted on today's exam.   Electronically Signed   By: Maisie Fus  Register   On: 05/20/2013 07:34    CT of the brain   IMPRESSION:  1. Left frontal lobe intraparenchymal hematoma with intraventricular  hemorrhage within the left lateral ventricle as above. Hemorrhagic  infarct with intraventricular extension is suspected. No midline  shift or hydrocephalus identified.  2. Atrophy with chronic microvascular ischemic changes.  CT 05/19/13  IMPRESSION:  Left frontal lobe hematoma and surrounding vasogenic edema  relatively similar to prior exam.  Change in appearance of intraventricular blood, less notable within  the left ventricle and now visualized within the right ventricle.  Subarachnoid blood now noted most prominent posterior sylvian  fissure.   MRI of the brain    MRA of the brain    2D Echocardiogram  Study Conclusions  - Left ventricle: The cavity size was normal. There was mild focal basal hypertrophy of the septum. Systolic function was normal. The estimated ejection fraction was in the range of 55% to 60%. Wall motion was normal; there were no regional wall motion abnormalities. Doppler parameters are consistent with abnormal left ventricular relaxation (grade 1 diastolic dysfunction). - Mitral valve: Mild regurgitation. - Left atrium: The atrium was moderately dilated. - Pulmonary arteries: Systolic pressure was mildly increased. PA peak pressure: 41mm Hg (S). Impressions:  - Possible small, oscillating density (? thrombus; ?vegetation)associated with RA wire vs chiari network; clinical correlation recommended. Since 11/27/11, percardial and pleural effusions resolved.   Carotid Doppler   Vascular Ultrasound  Carotid Duplex (Doppler) has been completed.  Findings suggest 1-39% internal carotid artery stenosis bilaterally.  Vertebral arteries are patent with antegrade flow.     CXR   IMPRESSION:  1. Satisfactory endotracheal and nasogastric tube placements.  2. Bilateral airspace disease may be due to pneumonia.  Noncardiogenic edema is another consideration.   EKG   Sinus rhythm Borderline prolonged PR interval Probable left atrial enlargement Incomplete left bundle branch block LVH with secondary repolarization abnormality Borderline prolonged QT interval  Therapy Recommendations CIR recommended  Physical Exam  General: The patient is extubated, alert. The patient is oriented to person, place, not date.  Skin: No significant peripheral edema is noted.   Neurologic Exam  Mental status: The patient is able to follow commands.  Cranial nerves: Facial symmetry is not present. The left nasolabial fold is depressed, asymmetric smile. Eyes are mid position, minimal dolls. VF are full, restriction of superior gaze.  Motor: The patient is able to move the arms and legs symmetrically, with good strength.  Sensory examination: The patient has good soft touch sensation on all 4's.  Coordination: The patient has good finger-nose-finger and toe-to-finger bilaterally.  Gait and station: The gait could not be tested.  Reflexes: Deep tendon reflexes are symmetric.    ASSESSMENT Ann Kaufman is a 79 y.o. female presenting with a left frontal intracranial hemorrhage. The patient was on Xarelto prior to admission. The patient is off of antiplatelet medications and anticoagulants at this time. Supportive care. FEIBA protocol. Seizure at the time of presentation. The patient is on Keppra.   Anticoagulation on admission  Atrial fibrillation  Hypertension  Pacemaker placement  History of pericardial patient  Depression  Hospital day # 3  followup CT of the head showed some resolution of the intraventricular blood, no hydrocephalus  TREATMENT/PLAN   Supportive care  Physical,  occupational therapies following, the patient should be a good rehab candidate, CIR recommended  Critical care medicine is managing  ST to see for swallowing evaluation   WILLIS,CHARLES KEITH  05/21/2013 11:05 AM

## 2013-05-21 NOTE — Progress Notes (Signed)
Occupational Therapy Evaluation Patient Details Name: Ann Kaufman MRN: [id number] DOB: 11-09-1929 Today's Date: 05/21/2013 Time: 4098-1191 OT Time Calculation (min): 29 min  OT Assessment / Plan / Recommendation History of present illness Brought by EMS to [facility] ED as code stroke and seizure. Intubated due to AMS with compromised airway and resp distress. CT head revealed Lt frontal ICH. PMH of CAF, chronic anticoagulation. Lives independently @ baseline.    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 Pt presents with generalized weakness and cognitive deficits, especially executive level skills. Pt will benefit from CIR to assist with return to PLOF. Pt will benefit from skilled OT services to facilitate D/C to next venue due to below deficits.    OT Assessment  Patient needs continued OT Services    Follow Up Recommendations  CIR    Barriers to Discharge  (unsure of support)    Equipment Recommendations  3 in 1 bedside comode    Recommendations for Other Services Rehab consult  Frequency  Min 3X/week    Precautions / Restrictions Precautions Precautions: Fall   Pertinent Vitals/Pain O2 sats 99 3L O2 94 RA after activity    ADL  Eating/Feeding: Supervision/safety (due to impaired attention) Grooming: Supervision/safety Where Assessed - Grooming: Unsupported standing Upper Body Bathing: Supervision/safety;Set up Where Assessed - Upper Body Bathing: Supported sitting Lower Body Bathing: Minimal assistance Where Assessed - Lower Body Bathing: Supported sit to stand Upper Body Dressing: Supervision/safety;Set up Where Assessed - Upper Body Dressing: Unsupported sitting Lower Body Dressing: Moderate assistance Where Assessed - Lower Body Dressing: Supported sit to Pharmacist, hospital: Minimal assistance Toilet Transfer Method: Other (comment) (ambulating) Toilet  Transfer Equipment: Comfort height toilet Toileting - Clothing Manipulation and Hygiene: Supervision/safety Where Assessed - Toileting Clothing Manipulation and Hygiene: Sit to stand from 3-in-1 or toilet Equipment Used: Gait belt Transfers/Ambulation Related to ADLs: min A.  ADL Comments: affected by higher level cognitive deficits. Bumping iinto objects.    OT Diagnosis: Generalized weakness;Cognitive deficits;Disturbance of vision  OT Problem List: Decreased strength;Decreased activity tolerance;Impaired balance (sitting and/or standing);Impaired vision/perception;Decreased cognition;Decreased safety awareness;Decreased knowledge of use of DME or AE;Decreased knowledge of precautions OT Treatment Interventions: Self-care/ADL training;DME and/or AE instruction;Therapeutic exercise;Therapeutic activities;Cognitive remediation/compensation;Visual/perceptual remediation/compensation;Patient/family education;Balance training   OT Goals(Current goals can be found in the care plan section) Acute Rehab OT Goals Patient Stated Goal: to return to [employer] OT Goal Formulation: Patient unable to participate in goal setting Time For Goal Achievement: 06/04/13 Potential to Achieve Goals: Good  Visit Information  Last OT Received On: 05/21/13 Assistance Needed: +1 History of Present Illness: Brought by EMS to [facility] ED as code stroke and seizure. Intubated due to AMS with compromised airway and resp distress. CT head revealed Lt frontal ICH. PMH of CAF, chronic anticoagulation. Lives independently @ baseline.        Prior Functioning     Home Living Family/patient expects to be discharged to:: Inpatient rehab Living Arrangements: Alone Type of Home: Apartment Prior Function Level of Independence: Independent Comments: volunteered with mentally handicapped adults Communication Communication: Expressive difficulties Dominant Hand: Left         Vision/Perception Vision -  History Baseline Vision: Wears glasses only for reading Patient Visual Report: No change from baseline Vision - Assessment Eye Alignment: Within Functional Limits Vision Assessment: Vision impaired - to be further tested in functional context Additional Comments: decreased visual attention. ? difficulty  with saccadic eye movement Perception Perception: Within Functional Limits Praxis Praxis: Intact   Cognition  Cognition Arousal/Alertness: Awake/alert Behavior During Therapy: Flat affect Overall Cognitive Status: Impaired/Different from baseline Area of Impairment: Orientation;Attention;Memory;Safety/judgement;Awareness;Problem solving Orientation Level: Disoriented to;Place;Time;Situation Current Attention Level: Sustained Memory: Decreased recall of precautions;Decreased short-term memory Safety/Judgement: Decreased awareness of safety;Decreased awareness of deficits Awareness: Emergent Problem Solving: Slow processing;Decreased initiation;Requires verbal cues General Comments: decreased slf monitoring    Extremity/Trunk Assessment Upper Extremity Assessment Upper Extremity Assessment: Generalized weakness Lower Extremity Assessment Lower Extremity Assessment: Overall WFL for tasks assessed Cervical / Trunk Assessment Cervical / Trunk Assessment: Normal     Mobility Bed Mobility Bed Mobility: Supine to Sit Rolling Left: 5: Supervision Transfers Transfers: Sit to Stand;Stand to Sit Sit to Stand: 4: Min assist Stand to Sit: 4: Min assist Details for Transfer Assistance: impulsive at times     Exercise     Balance Static Standing Balance Static Standing - Balance Support: During functional activity Static Standing - Level of Assistance: 5: Stand by assistance   End of Session OT - End of Session Equipment Utilized During Treatment: Gait belt Activity Tolerance: Patient tolerated treatment well Patient left: in chair;with call bell/phone within reach;with  family/visitor present Nurse Communication: Mobility status;Other (comment) (need for chair alarm)  GO     Helmut Hennon,HILLARY 05/21/2013, 3:27 PM [facility], OTR/L  [phone] 05/21/2013

## 2013-05-21 NOTE — Progress Notes (Signed)
PULMONARY  / CRITICAL CARE MEDICINE  Name: Ann Kaufman MRN: [id number] DOB: 1930-03-17    ADMISSION DATE:  05/18/2013  REFERRING MD :  EDP PRIMARY SERVICE: PCCM  BRIEF PATIENT DESCRIPTION:  7 F with PMH of CAF, chronic anticoagulation. Lives independently @ baseline. Brought by EMS to [facility] ED as code stroke and seizure. Intubated due to AMS with compromised airway and resp distress. CT head revealed ICH.   SIGNIFICANT EVENTS / STUDIES:  11/26 CT head: Left frontal lobe intraparenchymal hematoma with intraventricular hemorrhage within the left lateral ventricle as above. Hemorrhagic infarct with intraventricular extension is suspected. No midline shift or hydrocephalus identified  11/26 Admission CXR: diffuse infiltrates/edema pattern  LINES / TUBES: ETT 11/26 >>11/28  CULTURES: Resp 11/26 >>poly microbial  ANTIBIOTICS: Amp-sulbactam 11/26 >>   SUBJECTIVE: Arousable and following simple commands.  VITAL SIGNS: Temp:  [98.1 F (36.7 C)-99 F (37.2 C)] 98.9 F (37.2 C) (11/29 0801) Pulse Rate:  [59-88] 62 (11/29 0500) Resp:  [16-33] 27 (11/29 0500) BP: (99-127)/(39-92) 112/41 mmHg (11/29 0500) SpO2:  [93 %-100 %] 97 % (11/29 0500) Weight:  [73.7 kg (162 lb 7.7 oz)] 73.7 kg (162 lb 7.7 oz) (11/29 0500) HEMODYNAMICS:   VENTILATOR SETTINGS:   INTAKE / OUTPUT: Intake/Output     11/28 0701 - 11/29 0700 11/29 0701 - 11/30 0700   I.V. (mL/kg) 371.1 (5)    NG/GT     IV Piggyback 460    Total Intake(mL/kg) 831.1 (11.3)    Urine (mL/kg/hr) 1005 (0.6)    Total Output 1005     Net -173.9           PHYSICAL EXAMINATION:  General:  WD/WN, extubated and doing well. Neuro: Arousable and following commands HEENT: [place]/AT Cardiovascular: RRR, diminished heart sounds Lungs: CTA bilaterally. Abdomen: Soft, NT, ND, obese, hypoactive bs. Ext: warm, no edema, SCDs in place bilaterally.  LABS:  CBC  Recent Labs Lab 05/19/13 0230 05/20/13 0322 05/21/13 0740  WBC 10.5 7.2  6.2  HGB 12.2 12.1 10.3*  HCT 38.0 36.8 31.9*  PLT 153 128* 124*   Coag's  Recent Labs Lab 05/19/13 0230 05/19/13 0820 05/19/13 1345  INR 1.51* 1.58* 1.38   BMET  Recent Labs Lab 05/18/13 2113 05/19/13 0230 05/20/13 0322  NA 141 138 138  K 3.6 4.2 3.7  CL 102 103 103  CO2 28 24 24   BUN 22 21 14   CREATININE 1.05 1.09 0.79  GLUCOSE 144* 123* 117*   Electrolytes  Recent Labs Lab 05/18/13 2113 05/19/13 0230 05/20/13 0322  CALCIUM 9.2 8.3* 8.4   Sepsis Markers No results found for this basename: LATICACIDVEN, PROCALCITON, O2SATVEN,  in the last 168 hours ABG  Recent Labs Lab 05/18/13 2249 05/19/13 0958 05/20/13 0336  PHART 7.305* 7.372 7.359  PCO2ART 51.9* 46.1* 46.1*  PO2ART 176.0* 112.0* 85.5   Liver Enzymes No results found for this basename: AST, ALT, ALKPHOS, BILITOT, ALBUMIN,  in the last 168 hours Cardiac Enzymes No results found for this basename: TROPONINI, PROBNP,  in the last 168 hours Glucose  Recent Labs Lab 05/20/13 1114 05/20/13 1604 05/20/13 1934 05/21/13 0012 05/21/13 0447 05/21/13 0740  GLUCAP 96 87 97 112* 104* 118*   CXR: 11/27 Resolving pulm edema, residual left base atelectasis/infiltrate  ASSESSMENT / PLAN:  PULMONARY A: Acute respiratory distress, intubated Pulmonary edema (cardiogenic vs neurogenic) vs ALI /ARDS Former smoker on chronic bronchodilators (Ipratropium) P:  - Extubated and doing well. - Titrate O2. - PT/OT. - IS  per RT protocol. - Empiric BDs ordered.  CARDIOVASCULAR A: Chronic atrial fibrillation, at home on xarelto S/P PPM (intermittent PM dependent) H/O Htn P:  - BP goals to be established by Neurology. - Holding xarelto.  RENAL A:  Mild renal insuff, uncertain chronicity P:   - Monitor BMET intermittently. - Correct electrolytes as indicated. - Avoid free water as at risk for cerebral edema.  GASTROINTESTINAL A:  No issues P:   - SUP: IV famotidine - Swallow evaluation  pending.  HEMATOLOGIC A:  No issues Chronic anticoagulation with Xarelto P:  - DVT px: SCDs - Monitor CBC intermittently - Anticoagulation reversal protocol ordered  INFECTIOUS A:  Possible aspiration PNA P:   - Micro and abx as above  ENDOCRINE A:  Mild hyperglycemia without prior dx of DM P:   - CBGs/SSI ordered  NEUROLOGIC A:  Acute L hemispheric intraparenchymal bleed with R hemiparesis P:   - Neuro/Stroke following - Checking A1c and lipid panel, Repeat CT head, MRI/MRA brain when neuro feels appropriate. - PT/OT/SLP consulted. - Echo, carotid dopplers per neuro. - D/C sedation. - Daily WUA unless contraindicated.  Extubated, appropriate, will move to tele and to [facility], PCCM will sign off, please call back if needed.  Alyson Reedy, M.D. [facility] Pulmonary/Critical Care Medicine. Pager: [phone]. After hours pager: [phone].

## 2013-05-21 NOTE — Evaluation (Signed)
Clinical/Bedside Swallow Evaluation Patient Details  Name: Ann Kaufman MRN: [id number] Date of Birth: 22-Mar-1930  Today's Date: 05/21/2013 Time: 1110-1145 SLP Time Calculation (min): 35 min  Past Medical History:  Past Medical History  Diagnosis Date  . Pacemaker   . Atrial fibrillation   . Pericardial effusion   . Hypertension   . Depression   . Arthritis    Past Surgical History:  Past Surgical History  Procedure Laterality Date  . Pacemaker insertion    . Abdominal hysterectomy    . Breast lumpectomy    . Appendectomy    . Bowel resection     HPI:  Ann Kaufman is an 79 y.o. female with a history of atrial fibrillation and pacemaker placement, anticoagulation on Xarelto, and hypertension who was brought to the emergency room after developing speech difficulty and right facial droop as well as seizure activity. She was noted to have focal motor seizure activity on arrival in the emergency room in addition to being unresponsive. She was intubated for airway protection following administration of 2 mg of Ativan which stopped seizure activity. Propofol drip was started for sedation as well as seizure management. CT scan of her head showed a 3.6 cm left frontal intraparenchymal hematoma. There was evidence of left lateral ventricular extension. No signs of hydrocephalus were seen. A loading dose of Keppra was given, 1000 mg. Anticoagulation reversing protocol was also ordered.     Assessment / Plan / Recommendation Clinical Impression  Pt able to consume regular solids and thin liquids without difficutly or evidence of aspiration. Also observed to handle large pills well when given by RN. No SLP f/u needed for swallowing, pt may intiate a regular diet with thin liquids. Will follow for cognition.     Aspiration Risk  Mild    Diet Recommendation Regular;Thin liquid   Liquid Administration via: Cup;Straw Medication Administration: Whole meds with liquid Supervision: Patient  able to self feed Postural Changes and/or Swallow Maneuvers: Seated upright 90 degrees    Other  Recommendations Oral Care Recommendations: Oral care BID   Follow Up Recommendations  Inpatient Rehab    Frequency and Duration min 2x/week      Pertinent Vitals/Pain NA    SLP Swallow Goals     Swallow Study Prior Functional Status  Cognitive/Linguistic Baseline: Information not available Type of Home: Apartment  Lives With: Alone Vocation: Volunteer work    General Date of Onset: 05/18/13 HPI: Ann Kaufman is an 79 y.o. female with a history of atrial fibrillation and pacemaker placement, anticoagulation on Xarelto, and hypertension who was brought to the emergency room after developing speech difficulty and right facial droop as well as seizure activity. She was noted to have focal motor seizure activity on arrival in the emergency room in addition to being unresponsive. She was intubated for airway protection following administration of 2 mg of Ativan which stopped seizure activity. Propofol drip was started for sedation as well as seizure management. CT scan of her head showed a 3.6 cm left frontal intraparenchymal hematoma. There was evidence of left lateral ventricular extension. No signs of hydrocephalus were seen. A loading dose of Keppra was given, 1000 mg. Anticoagulation reversing protocol was also ordered.   Type of Study: Bedside swallow evaluation Diet Prior to this Study: NPO Temperature Spikes Noted: No Respiratory Status: Room air History of Recent Intubation: Yes Length of Intubations (days): 2 days Date extubated: 05/20/13 Behavior/Cognition: Alert;Cooperative;Confused;Pleasant mood Oral Cavity - Dentition: Adequate natural dentition Self-Feeding Abilities:  Able to feed self Patient Positioning: Upright in bed Baseline Vocal Quality: Clear Volitional Cough: Strong Volitional Swallow: Able to elicit    Oral/Motor/Sensory Function Overall Oral Motor/Sensory  Function: Appears within functional limits for tasks assessed   Ice Chips     Thin Liquid Thin Liquid: Within functional limits    Nectar Thick     Honey Thick     Puree Puree: Within functional limits   Solid   GO    Solid: Within functional limits      [facility], [place] CCC-SLP [phone]  Claudine Mouton 05/21/2013,12:09 PM

## 2013-05-21 NOTE — Progress Notes (Signed)
Rehab Admissions Coordinator Note:  Patient was screened by Clois Dupes for appropriateness for an Inpatient Acute Rehab Consult.  At this time, we are recommending Inpatient Rehab consult. Please order.  Clois Dupes 05/21/2013, 2:49 PM  I can be reached at [phone].

## 2013-05-21 NOTE — Evaluation (Signed)
Speech Language Pathology Evaluation Patient Details Name: Ann Kaufman MRN: [id number] DOB: 09/27/1929 Today's Date: 05/21/2013 Time: 1110-     Problem List:  Patient Active Problem List   Diagnosis Date Noted  . Acute respiratory failure 05/19/2013  . Chronic anticoagulation 05/19/2013  . Aspiration pneumonia 05/19/2013  . ICH (intracerebral hemorrhage) 05/18/2013  . Dyspnea on exertion 04/06/2013  . Atrial fibrillation, chronic 04/06/2013  . Atherosclerosis of aorta 04/06/2013  . Hypokalemia 11/28/2011  . Heart palpitations 11/27/2011  . Pleural effusion 11/27/2011  . Atrial fibrillation with RVR 11/27/2011  . HTN (hypertension) 11/27/2011   Past Medical History:  Past Medical History  Diagnosis Date  . Pacemaker   . Atrial fibrillation   . Pericardial effusion   . Hypertension   . Depression   . Arthritis    Past Surgical History:  Past Surgical History  Procedure Laterality Date  . Pacemaker insertion    . Abdominal hysterectomy    . Breast lumpectomy    . Appendectomy    . Bowel resection     HPI:  Ann Kaufman is an 79 y.o. female with a history of atrial fibrillation and pacemaker placement, anticoagulation on Xarelto, and hypertension who was brought to the emergency room after developing speech difficulty and right facial droop as well as seizure activity. She was noted to have focal motor seizure activity on arrival in the emergency room in addition to being unresponsive. She was intubated for airway protection following administration of 2 mg of Ativan which stopped seizure activity. Propofol drip was started for sedation as well as seizure management. CT scan of her head showed a 3.6 cm left frontal intraparenchymal hematoma. There was evidence of left lateral ventricular extension. No signs of hydrocephalus were seen. A loading dose of Keppra was given, 1000 mg. Anticoagulation reversing protocol was also ordered.     Assessment / Plan /  Recommendation Clinical Impression  Pt demonstrates moderate cognitive deficits that impact her safety and ability to carry out basic ADLs. Initially pt appeared appropriate but then seemed to use humor to diguise deficits. Pt demosntrated poor reasoning, awareness, problem solving particularly with functional tasks. She would not be capable of managing her ADLs without 24 hour supervision. She was also noted to have occasional semantic and phonemic paraphasias at conversation level though otherwise language was appropriate. Recommend CIR. acute SLP to follow.     SLP Assessment  Patient needs continued Speech Lanaguage Pathology Services    Follow Up Recommendations  Inpatient Rehab    Frequency and Duration min 2x/week  2 weeks   Pertinent Vitals/Pain NA   SLP Goals  SLP Goals Potential to Achieve Goals: Good Potential Considerations: Family/community support  SLP Evaluation Prior Functioning  Cognitive/Linguistic Baseline: Information not available Type of Home: Apartment  Lives With: Alone Vocation: Volunteer work   IT consultant  Overall Cognitive Status: Impaired/Different from baseline Arousal/Alertness: Awake/alert Orientation Level: Oriented to person;Oriented to place;Oriented to time;Oriented to situation Attention: Focused;Sustained Focused Attention: Appears intact Sustained Attention: Appears intact Memory: Impaired Memory Impairment: Storage deficit;Retrieval deficit;Decreased recall of new information Awareness: Impaired Awareness Impairment: Emergent impairment;Intellectual impairment Problem Solving: Impaired Problem Solving Impairment: Verbal complex;Functional complex Executive Function: Sequencing;Organizing;Self Monitoring;Self Correcting;Reasoning Reasoning: Impaired Reasoning Impairment: Verbal complex;Functional complex;Functional basic Sequencing: Impaired Sequencing Impairment: Functional complex Organizing: Impaired Organizing Impairment:  Functional complex Self Monitoring: Impaired Self Monitoring Impairment: Verbal complex;Functional basic Self Correcting: Impaired Self Correcting Impairment: Verbal complex;Functional basic Safety/Judgment: Impaired    Comprehension  Auditory Comprehension Overall Auditory  Comprehension: Appears within functional limits for tasks assessed    Expression Verbal Expression Overall Verbal Expression: Impaired Initiation: No impairment Automatic Speech: Name;Social Response Level of Generative/Spontaneous Verbalization: Conversation Repetition: No impairment Naming: No impairment Pragmatics: Impairment Impairments: Topic appropriateness Written Expression Dominant Hand: Left   Oral / Motor Oral Motor/Sensory Function Overall Oral Motor/Sensory Function: Appears within functional limits for tasks assessed Motor Speech Overall Motor Speech: Appears within functional limits for tasks assessed   GO    Harlon Ditty, MA CCC-SLP [phone]  Claudine Mouton 05/21/2013, 12:04 PM

## 2013-05-22 DIAGNOSIS — I619 Nontraumatic intracerebral hemorrhage, unspecified: Secondary | ICD-10-CM | POA: Diagnosis not present

## 2013-05-22 DIAGNOSIS — J690 Pneumonitis due to inhalation of food and vomit: Secondary | ICD-10-CM | POA: Diagnosis not present

## 2013-05-22 DIAGNOSIS — J9600 Acute respiratory failure, unspecified whether with hypoxia or hypercapnia: Secondary | ICD-10-CM | POA: Diagnosis not present

## 2013-05-22 DIAGNOSIS — R002 Palpitations: Secondary | ICD-10-CM

## 2013-05-22 DIAGNOSIS — G936 Cerebral edema: Secondary | ICD-10-CM | POA: Diagnosis not present

## 2013-05-22 DIAGNOSIS — I4891 Unspecified atrial fibrillation: Secondary | ICD-10-CM | POA: Diagnosis not present

## 2013-05-22 LAB — CBC
Hemoglobin: 10.5 g/dL — ABNORMAL LOW (ref 12.0–15.0)
MCH: 28.3 pg (ref 26.0–34.0)
MCHC: 32.9 g/dL (ref 30.0–36.0)
Platelets: 133 10*3/uL — ABNORMAL LOW (ref 150–400)
RBC: 3.71 MIL/uL — ABNORMAL LOW (ref 3.87–5.11)
RDW: 15.1 % (ref 11.5–15.5)

## 2013-05-22 LAB — GLUCOSE, CAPILLARY
Glucose-Capillary: 110 mg/dL — ABNORMAL HIGH (ref 70–99)
Glucose-Capillary: 117 mg/dL — ABNORMAL HIGH (ref 70–99)

## 2013-05-22 LAB — BASIC METABOLIC PANEL
CO2: 27 mEq/L (ref 19–32)
Calcium: 8.3 mg/dL — ABNORMAL LOW (ref 8.4–10.5)
Creatinine, Ser: 0.68 mg/dL (ref 0.50–1.10)
GFR calc Af Amer: >90 mL/min (ref >90)
GFR calc non Af Amer: 80 mL/min — ABNORMAL LOW (ref >90)
Glucose, Bld: 107 mg/dL — ABNORMAL HIGH (ref 70–99)
Potassium: 4.1 mEq/L (ref 3.5–5.1)
Sodium: 139 mEq/L (ref 135–145)

## 2013-05-22 LAB — PHOSPHORUS: Phosphorus: 1.6 mg/dL — ABNORMAL LOW (ref 2.3–4.6)

## 2013-05-22 LAB — MAGNESIUM: Magnesium: 2.2 mg/dL (ref 1.5–2.5)

## 2013-05-22 MED ORDER — LEVETIRACETAM 500 MG PO TABS
500.0000 mg | ORAL_TABLET | Freq: Two times a day (BID) | ORAL | Status: DC
  Administered 2013-05-22 – 2013-05-24 (×5): 500 mg via ORAL
  Filled 2013-05-22 (×7): qty 1

## 2013-05-22 MED ORDER — IPRATROPIUM BROMIDE 0.02 % IN SOLN
0.5000 mg | Freq: Two times a day (BID) | RESPIRATORY_TRACT | Status: DC
  Administered 2013-05-22 – 2013-05-24 (×4): 0.5 mg via RESPIRATORY_TRACT
  Filled 2013-05-22 (×4): qty 2.5

## 2013-05-22 MED ORDER — METOPROLOL TARTRATE 1 MG/ML IV SOLN
5.0000 mg | Freq: Once | INTRAVENOUS | Status: AC
  Administered 2013-05-22: 5 mg via INTRAVENOUS
  Filled 2013-05-22: qty 5

## 2013-05-22 MED ORDER — ALBUTEROL SULFATE (5 MG/ML) 0.5% IN NEBU
2.5000 mg | INHALATION_SOLUTION | Freq: Two times a day (BID) | RESPIRATORY_TRACT | Status: DC
  Administered 2013-05-22 – 2013-05-24 (×4): 2.5 mg via RESPIRATORY_TRACT
  Filled 2013-05-22 (×4): qty 0.5

## 2013-05-22 MED ORDER — FAMOTIDINE 20 MG PO TABS
20.0000 mg | ORAL_TABLET | Freq: Two times a day (BID) | ORAL | Status: DC
  Administered 2013-05-22 – 2013-05-24 (×5): 20 mg via ORAL
  Filled 2013-05-22 (×6): qty 1

## 2013-05-22 NOTE — Progress Notes (Signed)
Stroke Team Progress Note  HISTORY  Ann Kaufman is an 79 y.o. female with a history of atrial fibrillation and pacemaker placement, anticoagulation on Xarelto, and hypertension who was brought to the emergency room after developing speech difficulty and right facial droop as well as seizure activity. She was noted to have focal motor seizure activity on arrival in the emergency room in addition to being unresponsive. She was intubated for airway protection following administration of 2 mg of Ativan which stopped seizure activity. Propofol drip was started for sedation as well as seizure management. CT scan of her head showed a 3.6 cm left frontal intraparenchymal hematoma. There was evidence of left lateral ventricular extension. No signs of hydrocephalus were seen. A loading dose of Keppra was given, 1000 mg. Anticoagulation reversing protocol was also ordered.  LSN: 6:30 PM on 05/18/2013  tPA Given: No: Acute intraparenchymal hemorrhage.  MRankin: 4   Patient was not a TPA candidate secondary to ICH.   SUBJECTIVE No family is at bedside. The patient is alert, cooperative, slightly confused.  OBJECTIVE Most recent Vital Signs: Filed Vitals:   05/21/13 1957 05/21/13 2100 05/22/13 0500 05/22/13 0839  BP:  117/42 130/54   Pulse:  76 65   Temp:  100 F (37.8 C) 99.5 F (37.5 C)   TempSrc:      Resp:  18 18   Height:      Weight:   163 lb 11.2 oz (74.254 kg)   SpO2: 98% 98% 96% 97%   CBG (last 3)   Recent Labs  05/22/13 0001 05/22/13 0441 05/22/13 0804  GLUCAP 105* 110* 117*    IV Fluid Intake:      MEDICATIONS  . albuterol  2.5 mg Nebulization Q6H  . amiodarone  200 mg Per Tube Daily  . ampicillin-sulbactam (UNASYN) IV  1.5 g Intravenous Q8H  . antiseptic oral rinse  15 mL Mouth Rinse q12n4p  . chlorhexidine  15 mL Mouth Rinse BID  . famotidine (PEPCID) IV  20 mg Intravenous Q12H  . insulin aspart  0-9 Units Subcutaneous Q4H  . ipratropium  0.5 mg Nebulization Q6H  .  levETIRAcetam  500 mg Intravenous Q12H   PRN:  sodium chloride, acetaminophen (TYLENOL) oral liquid 160 mg/5 mL, albuterol, fentaNYL  Diet:  CardiacNPO Activity:  Bedrest DVT Prophylaxis:  SCD  CLINICALLY SIGNIFICANT STUDIES Basic Metabolic Panel:   Recent Labs Lab 05/21/13 0740 05/22/13 0504  NA 141 139  K 3.3* 4.1  CL 106 106  CO2 27 27  GLUCOSE 114* 107*  BUN 10 11  CREATININE 0.64 0.68  CALCIUM 8.2* 8.3*  MG 2.2 2.2  PHOS 2.1* 1.6*   Liver Function Tests: No results found for this basename: AST, ALT, ALKPHOS, BILITOT, PROT, ALBUMIN,  in the last 168 hours CBC:   Recent Labs Lab 05/21/13 0740 05/22/13 0504  WBC 6.2 6.2  HGB 10.3* 10.5*  HCT 31.9* 31.9*  MCV 86.0 86.0  PLT 124* 133*   Coagulation:   Recent Labs Lab 05/19/13 0230 05/19/13 0820 05/19/13 1345  LABPROT 17.8* 18.4* 16.6*  INR 1.51* 1.58* 1.38   Cardiac Enzymes: No results found for this basename: CKTOTAL, CKMB, CKMBINDEX, TROPONINI,  in the last 168 hours Urinalysis:   Recent Labs Lab 05/19/13 0135  COLORURINE YELLOW  LABSPEC 1.011  PHURINE 5.5  GLUCOSEU NEGATIVE  HGBUR NEGATIVE  BILIRUBINUR NEGATIVE  KETONESUR NEGATIVE  PROTEINUR 30*  UROBILINOGEN 0.2  NITRITE NEGATIVE  LEUKOCYTESUR NEGATIVE   Lipid Panel No results  found for this basename: chol,  trig,  hdl,  cholhdl,  vldl,  ldlcalc   HgbA1C  Lab Results  Component Value Date   HGBA1C 5.9* 05/19/2013    Urine Drug Screen:   No results found for this basename: labopia,  cocainscrnur,  labbenz,  amphetmu,  thcu,  labbarb    Alcohol Level: No results found for this basename: ETH,  in the last 168 hours  No results found.  CT of the brain   IMPRESSION:  1. Left frontal lobe intraparenchymal hematoma with intraventricular  hemorrhage within the left lateral ventricle as above. Hemorrhagic  infarct with intraventricular extension is suspected. No midline  shift or hydrocephalus identified.  2. Atrophy with chronic  microvascular ischemic changes.  CT 05/19/13  IMPRESSION:  Left frontal lobe hematoma and surrounding vasogenic edema  relatively similar to prior exam.  Change in appearance of intraventricular blood, less notable within  the left ventricle and now visualized within the right ventricle.  Subarachnoid blood now noted most prominent posterior sylvian  fissure.   MRI of the brain    MRA of the brain    2D Echocardiogram   Study Conclusions  - Left ventricle: The cavity size was normal. There was mild focal basal hypertrophy of the septum. Systolic function was normal. The estimated ejection fraction was in the range of 55% to 60%. Wall motion was normal; there were no regional wall motion abnormalities. Doppler parameters are consistent with abnormal left ventricular relaxation (grade 1 diastolic dysfunction). - Mitral valve: Mild regurgitation. - Left atrium: The atrium was moderately dilated. - Pulmonary arteries: Systolic pressure was mildly increased. PA peak pressure: 41mm Hg (S). Impressions:  - Possible small, oscillating density (? thrombus; ?vegetation)associated with RA wire vs chiari network; clinical correlation recommended. Since 11/27/11, percardial and pleural effusions resolved.   Carotid Doppler   Vascular Ultrasound  Carotid Duplex (Doppler) has been completed.  Findings suggest 1-39% internal carotid artery stenosis bilaterally. Vertebral arteries are patent with antegrade flow.     CXR   IMPRESSION:  1. Satisfactory endotracheal and nasogastric tube placements.  2. Bilateral airspace disease may be due to pneumonia.  Noncardiogenic edema is another consideration.   EKG   Sinus rhythm Borderline prolonged PR interval Probable left atrial enlargement Incomplete left bundle branch block LVH with secondary repolarization abnormality Borderline prolonged QT interval  Therapy Recommendations CIR recommended  Physical Exam  General: The patient  is extubated, alert. The patient is oriented to person, place, not date.  Skin: No significant peripheral edema is noted.   Neurologic Exam  Mental status: The patient is able to follow commands.  Cranial nerves: Facial symmetry is not present. The left nasolabial fold is depressed, asymmetric smile. Eyes are mid position, minimal dolls. VF are full, restriction of superior gaze.  Motor: The patient is able to move the arms and legs symmetrically, with good strength.  Sensory examination: The patient has good soft touch sensation on all 4's.  Coordination: The patient has good finger-nose-finger and toe-to-finger bilaterally.  Gait and station: The gait could not be tested.  Reflexes: Deep tendon reflexes are symmetric.    ASSESSMENT Ann Kaufman is a 79 y.o. female presenting with a left frontal intracranial hemorrhage. The patient was on Xarelto prior to admission. The patient is off of antiplatelet medications and anticoagulants at this time. Supportive care. FEIBA protocol. Seizure at the time of presentation. The patient is on Keppra.   Anticoagulation on admission  Atrial fibrillation  Hypertension  Pacemaker placement  History of pericardial patient  Depression  Hospital day # 4  followup CT of the head showed some resolution of the intraventricular blood, no hydrocephalus  TREATMENT/PLAN   Supportive care  Physical, occupational therapies following, the patient should be a good rehab candidate, CIR recommended  Aspirin therapy to be started 10 to 14 days following onset of bleed.    Tehran Rabenold KEITH  05/22/2013 9:14 AM

## 2013-05-22 NOTE — Progress Notes (Signed)
Patient ID: Ann Kaufman  female  ZOX:[id number]    DOB: [birth year]    DOA: 05/18/2013  PCP: Lorie Phenix, MD  75 F with PMH of CAF, chronic anticoagulation. Lives independently @ baseline. Brought by EMS to [facility] ED as code stroke and seizure. Intubated due to AMS with compromised airway and resp distress. CT head revealed ICH.  Patient was under critical care service, [facility] service assumed care on 05/22/13   Assessment/Plan:  Acute respiratory distress, pulmonary edema versus ARDS, possible aspiration pneumonia: Improving - Patient was intubated at the time of admission, admitted to ICU, extubated on 05/20/2013, chest x-ray had shown diffuse infiltrates with edema pattern - Currently O2 sats 97% on 2 L, continue scheduled bronchodilators BID, Unasyn (will change to Augmentin at DC),   Acute left hemispheric intracranial hemorrhage: Patient had speech difficulty, right facial droop, seizure activity at the time of admission. Patient was on anticoagulation on xarelto prior to admission due to atrial fibrillation - Neurology/stroke service following - Carotid Dopplers 1-39% ICA stenosis bilaterally - 2-D echo showed EF of 55-60%, normal wall motion, grade 1 diastolic dysfunction, ossible small, oscillating density (? Thrombus; ?vegetation)associated with RA wire vs chiari network. No TEE recommended by neurology - PT, OT recommended CIR, consult placed  Seizure activity - Continue on Keppra    Atrial fibrillation, chronic - Continue amiodarone, no anticoagulation due to acute ICH  Diabetes mellitus -Continue sliding scale insulin   DVT Prophylaxis:  Code Status:SCDs   Disposition:Inpatient rehabilitation, consult placed    Subjective: No complaints, no family at bedside,   Objective: Weight change: 0.554 kg (1 lb 3.5 oz)  Intake/Output Summary (Last 24 hours) at 05/22/13 1247 Last data filed at 05/22/13 0800  Gross per 24 hour  Intake    240 ml  Output    950 ml  Net    -710 ml   Blood pressure 130/54, pulse 65, temperature 99.5 F (37.5 C), temperature source Oral, resp. rate 18, height 5\' 4"  (1.626 m), weight 74.254 kg (163 lb 11.2 oz), SpO2 97.00%.  Physical Exam: General: Alert and awake, oriented x3, not in any acute distress. CVS: S1-S2 clear, no murmur rubs or gallops Chest: clear to auscultation bilaterally, no wheezing, rales or rhonchi Abdomen: soft nontender, nondistended, normal bowel sounds  Extremities: no cyanosis, clubbing or edema noted bilaterally Neuro: Cranial nerves II-XII intact, 5/5 strength in upper and lower extremities bilaterally   Lab Results: Basic Metabolic Panel:  Recent Labs Lab 05/21/13 0740 05/22/13 0504  NA 141 139  K 3.3* 4.1  CL 106 106  CO2 27 27  GLUCOSE 114* 107*  BUN 10 11  CREATININE 0.64 0.68  CALCIUM 8.2* 8.3*  MG 2.2 2.2  PHOS 2.1* 1.6*   Liver Function Tests: No results found for this basename: AST, ALT, ALKPHOS, BILITOT, PROT, ALBUMIN,  in the last 168 hours No results found for this basename: LIPASE, AMYLASE,  in the last 168 hours No results found for this basename: AMMONIA,  in the last 168 hours CBC:  Recent Labs Lab 05/21/13 0740 05/22/13 0504  WBC 6.2 6.2  HGB 10.3* 10.5*  HCT 31.9* 31.9*  MCV 86.0 86.0  PLT 124* 133*   Cardiac Enzymes: No results found for this basename: CKTOTAL, CKMB, CKMBINDEX, TROPONINI,  in the last 168 hours BNP: No components found with this basename: POCBNP,  CBG:  Recent Labs Lab 05/21/13 2029 05/22/13 0001 05/22/13 0441 05/22/13 0804 05/22/13 1201  GLUCAP 119* 105* 110* 117* 110*  Micro Results: Recent Results (from the past 240 hour(s))  CULTURE, RESPIRATORY (NON-EXPECTORATED)     Status: None   Collection Time    05/19/13 12:01 AM      Result Value Range Status   Specimen Description SPUTUM   Final   Special Requests NONE   Final   Gram Stain     Final   Value: NO WBC SEEN     NO SQUAMOUS EPITHELIAL CELLS SEEN     FEW GRAM  NEGATIVE RODS     RARE GRAM POSITIVE COCCI     IN PAIRS RARE GRAM POSITIVE RODS   Culture     Final   Value: NORMAL OROPHARYNGEAL FLORA     Performed at [employer]   Report Status 05/21/2013 FINAL   Final  MRSA PCR SCREENING     Status: None   Collection Time    05/19/13  1:35 AM      Result Value Range Status   MRSA by PCR NEGATIVE  NEGATIVE Final   Comment:            The GeneXpert MRSA Assay (FDA     approved for NASAL specimens     only), is one component of a     comprehensive MRSA colonization     surveillance program. It is not     intended to diagnose MRSA     infection nor to guide or     monitor treatment for     MRSA infections.    Studies/Results: Ct Head Wo Contrast  05/19/2013   CLINICAL DATA:  Intracranial hemorrhage follow-up.  EXAM: CT HEAD WITHOUT CONTRAST  TECHNIQUE: Contiguous axial images were obtained from the base of the skull through the vertex without intravenous contrast.  COMPARISON:  05/18/2013.  FINDINGS: Left frontal lobe hematoma with maximal transverse dimension of 4 x 3.8 cm similar to prior exam. Mild surrounding vasogenic edema. Breakthrough of hemorrhage into the left lateral ventricle. Decrease in amount of blood within the left lateral ventricle now with spread of intraventricular blood within the dependent aspect of both ventricles. Interval development of subarachnoid blood most notable in the posterior sylvian fissure in greater on the left.  The cause of the left frontal lobe hematoma is indeterminate. Although it is possible this is related to hemorrhagic transformation of an infarct as question on the prior examination, it is also possible that this represents result of amyloid angiography. Underlying mass or vascular malformation cannot be excluded. This will need to be followed until clear to evaluate for possibility of underlying mass or vascular malformation. No history of trauma to suggest this represents result of Injury. No  obvious skull fracture.  IMPRESSION: Left frontal lobe hematoma and surrounding vasogenic edema relatively similar to prior exam.  Change in appearance of intraventricular blood, less notable within the left ventricle and now visualized within the right ventricle.  Subarachnoid blood now noted most prominent posterior sylvian fissure.  Please see above.  This is a call report.   Electronically Signed   By: Bridgett Larsson M.D.   On: 05/19/2013 11:36   Ct Head Wo Contrast   (if New Onset Seizure And/or Head Trauma)  05/18/2013   CLINICAL DATA:  Unresponsive  EXAM: CT HEAD WITHOUT CONTRAST  TECHNIQUE: Contiguous axial images were obtained from the base of the skull through the vertex without intravenous contrast.  COMPARISON:  None.  FINDINGS: A left frontal intraparenchymal hematoma measuring 3.6 x 3.8 cm is seen (series 2, image  16). This hematoma involves the overlying cortical gray matter as well as the subcortical white matter, and likely the left caudate head. There is a small amount of localized edema. Hyperdense intraventricular hemorrhage is seen within the left lateral ventricle as well (series 2, image 18). No significant midline shift identified at this time. There is no hydrocephalus. No acute intracranial hemorrhage seen within the right cerebral hemisphere. There is no extra-axial fluid collection.  Mild atrophy with chronic microvascular ischemic changes are present. No mass lesion identified. The calvarium is intact.  Paranasal sinuses and mastoid air cells are clear.  Endotracheal tube is partially visualized.  IMPRESSION: 1. Left frontal lobe intraparenchymal hematoma with intraventricular hemorrhage within the left lateral ventricle as above. Hemorrhagic infarct with intraventricular extension is suspected. No midline shift or hydrocephalus identified. 2. Atrophy with chronic microvascular ischemic changes.  Critical Value/emergent results were called by telephone at the time of interpretation on  05/18/2013 at 11:03 PM to Dr.DANIELLE RAY , who verbally acknowledged these results.   Electronically Signed   By: Rise Mu M.D.   On: 05/18/2013 23:08   Dg Chest Portable 1 View  05/20/2013   CLINICAL DATA:  Intubation.  EXAM: PORTABLE CHEST - 1 VIEW  COMPARISON:  05/19/2013.  05/18/2013.  FINDINGS: Endotracheal tube in good anatomic position 3 cm above the carina. NG tube noted in good position. Cardiac pacer noted with lead tips in the right atrium and right ventricle. Previously identified right upper lobe infiltrate has largely cleared. Poor lung volumes with basilar atelectasis versus basilar pneumonia. No pleural effusion or pneumothorax. No acute osseous abnormality.  IMPRESSION: 1. Support lines in good anatomic position. 2. Interim clearing of right upper lobe pneumonia. Poor lung volumes with bibasilar atelectasis versus bibasilar pneumonia noted on today's exam.   Electronically Signed   By: Maisie Fus  Register   On: 05/20/2013 07:34   Dg Chest Port 1 View  05/19/2013   CLINICAL DATA:  Endotracheal tube evaluation.  EXAM: PORTABLE CHEST - 1 VIEW  COMPARISON:  05/18/2013.  FINDINGS: Endotracheal tube tip is a 2.2 cm above the carina.  Nasogastric tube courses below the diaphragm. Tip is not included on the present exam.  Sequential pacemaker enters from the left with leads unchanged.  Heart is slightly enlarged.  Decreased in degree although incomplete clearance of asymmetric airspace disease. This may represent resolving pulmonary edema. Infectious infiltrate not excluded in the proper clinical setting. Residual left base atelectasis/ infiltrate.  No gross pneumothorax.  Calcified aorta.  IMPRESSION: Decreased in degree although incomplete clearance of asymmetric airspace disease. This may represent resolving pulmonary edema. Infectious infiltrate not excluded in the proper clinical setting. Residual left base atelectasis/ infiltrate.  Endotracheal tube tip 2.2 cm above the carina.    Electronically Signed   By: Bridgett Larsson M.D.   On: 05/19/2013 08:04   Dg Chest Portable 1 View  05/18/2013   CLINICAL DATA:  Post intubation.  EXAM: PORTABLE CHEST - 1 VIEW  COMPARISON:  CT chest and chest radiograph 11/26/2011.  FINDINGS: Endotracheal tube terminates 3.1 cm above the carina. Nasogastric tube is followed into the stomach. Left subclavian pacemaker lead tips project over the right atrium and right ventricle. Heart size normal. There is upper and midlung zone predominant airspace disease. No pleural fluid.  IMPRESSION: 1. Satisfactory endotracheal and nasogastric tube placements. 2. Bilateral airspace disease may be due to pneumonia. Noncardiogenic edema is another consideration.   Electronically Signed   By: Leanna Battles M.D.  On: 05/18/2013 21:25    Medications: Scheduled Meds: . albuterol  2.5 mg Nebulization BID  . amiodarone  200 mg Per Tube Daily  . ampicillin-sulbactam (UNASYN) IV  1.5 g Intravenous Q8H  . antiseptic oral rinse  15 mL Mouth Rinse q12n4p  . chlorhexidine  15 mL Mouth Rinse BID  . famotidine  20 mg Oral BID  . insulin aspart  0-9 Units Subcutaneous Q4H  . ipratropium  0.5 mg Nebulization BID  . levETIRAcetam  500 mg Oral BID      LOS: 4 days   Eashan Schipani M.D. [facility] 05/22/2013, 12:47 PM Pager: [phone]  If 7PM-7AM, please contact night-coverage [email or web address] Password [email or web address]

## 2013-05-23 ENCOUNTER — Encounter (HOSPITAL_COMMUNITY): Payer: Self-pay | Admitting: Nurse Practitioner

## 2013-05-23 DIAGNOSIS — J9600 Acute respiratory failure, unspecified whether with hypoxia or hypercapnia: Secondary | ICD-10-CM | POA: Diagnosis not present

## 2013-05-23 DIAGNOSIS — I619 Nontraumatic intracerebral hemorrhage, unspecified: Secondary | ICD-10-CM

## 2013-05-23 DIAGNOSIS — I4891 Unspecified atrial fibrillation: Secondary | ICD-10-CM | POA: Diagnosis not present

## 2013-05-23 DIAGNOSIS — J690 Pneumonitis due to inhalation of food and vomit: Secondary | ICD-10-CM | POA: Diagnosis not present

## 2013-05-23 LAB — BASIC METABOLIC PANEL
CO2: 25 mEq/L (ref 19–32)
Calcium: 8.4 mg/dL (ref 8.4–10.5)
Chloride: 105 mEq/L (ref 96–112)
Creatinine, Ser: 0.64 mg/dL (ref 0.50–1.10)
Glucose, Bld: 116 mg/dL — ABNORMAL HIGH (ref 70–99)
Potassium: 4.3 mEq/L (ref 3.5–5.1)

## 2013-05-23 LAB — CBC
Hemoglobin: 10.9 g/dL — ABNORMAL LOW (ref 12.0–15.0)
MCH: 27.7 pg (ref 26.0–34.0)
MCV: 85.5 fL (ref 78.0–100.0)
Platelets: 178 10*3/uL (ref 150–400)
RBC: 3.94 MIL/uL (ref 3.87–5.11)
WBC: 7.1 10*3/uL (ref 4.0–10.5)

## 2013-05-23 MED ORDER — METOPROLOL TARTRATE 1 MG/ML IV SOLN
5.0000 mg | Freq: Four times a day (QID) | INTRAVENOUS | Status: DC | PRN
  Administered 2013-05-23: 5 mg via INTRAVENOUS
  Filled 2013-05-23: qty 5

## 2013-05-23 MED ORDER — METOPROLOL TARTRATE 25 MG PO TABS
25.0000 mg | ORAL_TABLET | Freq: Two times a day (BID) | ORAL | Status: DC
  Administered 2013-05-23 – 2013-05-24 (×3): 25 mg via ORAL
  Filled 2013-05-23 (×4): qty 1

## 2013-05-23 MED ORDER — AMIODARONE HCL 200 MG PO TABS
200.0000 mg | ORAL_TABLET | Freq: Every day | ORAL | Status: DC
  Administered 2013-05-24: 200 mg via ORAL
  Filled 2013-05-23: qty 1

## 2013-05-23 MED ORDER — ACETAMINOPHEN 325 MG PO TABS
650.0000 mg | ORAL_TABLET | Freq: Four times a day (QID) | ORAL | Status: DC | PRN
  Administered 2013-05-23 – 2013-05-24 (×2): 650 mg via ORAL
  Filled 2013-05-23 (×2): qty 2

## 2013-05-23 NOTE — Progress Notes (Signed)
Physical Therapy Treatment Patient Details Name: Ann Kaufman MRN: [id number] DOB: May 01, 1930 Today's Date: 05/23/2013 Time: 4098-1191 PT Time Calculation (min): 18 min  PT Assessment / Plan / Recommendation  History of Present Illness Brought by EMS to [facility] ED as code stroke and seizure. Intubated due to AMS with compromised airway and resp distress. CT head revealed Lt frontal ICH. PMH of CAF, chronic anticoagulation. Lives independently @ baseline.    PT Comments   Pt making good progress. Continue to recommend comprehensive inpatient rehab (CIR) for post-acute therapy needs.   Follow Up Recommendations  CIR     Does the patient have the potential to tolerate intense rehabilitation     Barriers to Discharge        Equipment Recommendations  Other (comment) (TBD)    Recommendations for Other Services    Frequency Min 4X/week   Progress towards PT Goals Progress towards PT goals: Progressing toward goals  Plan Current plan remains appropriate    Precautions / Restrictions Precautions Precautions: Fall   Pertinent Vitals/Pain VSS    Mobility  Transfers Sit to Stand: 4: Min assist;With upper extremity assist;With armrests;From chair/3-in-1 Stand to Sit: 4: Min assist;With upper extremity assist;With armrests;To chair/3-in-1 Details for Transfer Assistance: verbal cues to wait until I was ready. Pt impulsive at times. Ambulation/Gait Ambulation/Gait Assistance: 4: Min assist Ambulation Distance (Feet): 120 Feet Assistive device: Rolling walker Ambulation/Gait Assistance Details: Repeated verbal/tactile cues to steer walker back toward middle of hallway.  Pt repeatedly veering to the lt and with decr awareness of this. Gait Pattern: Step-through pattern;Decreased step length - right Gait velocity: decr    Exercises     PT Diagnosis:    PT Problem List:   PT Treatment Interventions:     PT Goals (current goals can now be found in the care plan section)    Visit  Information  Last PT Received On: 05/23/13 Assistance Needed: +1 History of Present Illness: Brought by EMS to [facility] ED as code stroke and seizure. Intubated due to AMS with compromised airway and resp distress. CT head revealed Lt frontal ICH. PMH of CAF, chronic anticoagulation. Lives independently @ baseline.     Subjective Data      Cognition  Cognition Arousal/Alertness: Awake/alert Behavior During Therapy: WFL for tasks assessed/performed Overall Cognitive Status: Impaired/Different from baseline Memory: Decreased short-term memory;Decreased recall of precautions Safety/Judgement: Decreased awareness of deficits;Decreased awareness of safety Problem Solving: Requires verbal cues    Balance     End of Session PT - End of Session Equipment Utilized During Treatment: Gait belt Activity Tolerance: Patient tolerated treatment well Patient left: in chair;with call bell/phone within reach;with family/visitor present Nurse Communication: Mobility status   GP     [name] 05/23/2013, 3:01 PM  [employer] PT [phone]

## 2013-05-23 NOTE — Consult Note (Signed)
CARDIOLOGY CONSULT NOTE   Patient ID: Ann Kaufman MRN: [id number], DOB/AGE: [birth year]   Admit date: 05/18/2013 Date of Consult: 05/23/2013  Primary Physician: Lorie Phenix, MD Primary Cardiologist: A. Parachos [facility];  new to   - seen by J. Robie Oats, MD   Pt. Profile  79 y/o female with h/o PAF on amiodarone and xarelto @ home, who presented on 11/26, with right sided wkns and intracerebral hemorrhage, whom we've been asked to eval 2/2 PAF.  Problem List  Past Medical History  Diagnosis Date  . Intracerebral hemorrhage     a. 04/2013 in setting of xarelto therapy.  . Atrial fibrillation     a. Dx in 2012;  b. Rhytm controlled - amiodarone, previously anticoagulated with xarelto (d/c'd 04/2013 in setting of ICH);  b. 04/2013 Echo: EF 55-60%, Gr 1 DD, mild MR, mod dil LA, PAsP .  . Hypertension   . Depression   . Arthritis   . Presence of permanent cardiac pacemaker     a. 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    Past Surgical History  Procedure Laterality Date  . Pacemaker insertion    . Abdominal hysterectomy    . Breast lumpectomy    . Appendectomy    . Bowel resection      Allergies  No Known Allergies  HPI   79 y/o female with a prior h/o PAF, which was first diagnosed about 2.5 yrs ago.  She has been followed by Dr. Cassie Freer.  It sounds that there was some difficulty in controlling her rate/rhythm and she required PPM placement (presumably tachy-brady). Following PPM placement, she was placed on amiodarone.  She has also been maintained on xarelto for anticoagulation.  She has done well over the past 2 yrs w/o recurrence of afib.  On 11/26, she was noted by her family to develop speech difficulty and right sided facial droop.  EMS was called and upon arrival to the ED she was noted to exhibit focal motor seizure activity and became unresponsive.  She was intubated and sedated and stat CT showed a Left frontal lobe  intraparenchymal hematoma with intraventricular hemorrhage within the left lateral ventricle. No midline shift or hydrocephalus was identified.  Patient was admitted to neuro ICU and neurology was consulted.  2D echo showed nl LV fxn, carotid u/s was w/o hemodynamically significant dzs.  She has been treated for aspiration pna.  She self-extubated on 11/28 and since has been recovering strength.  F/U CT on 11/27, showed some reduction in size of bleed.  She had been A paced during her admission but last night around 2100, she developed symptomatic afib with RVR, rates in the 100-110 range.  She describes having experienced tachypalpitations.  She spontaneously converted to sinus rhythm this morning after 9 AM.  She is currently asymptomatic.  Pts daughter notes that [name] has had more doe than usual over the past few months.  She was scheduled to f/u with pulmonology later this week regarding this but that outpatient cardiology evaluation was also being considered.  Patient denies chest pain, pnd, orthopnea, n, v, syncope, or edema.  Inpatient Medications  . albuterol  2.5 mg Nebulization BID  . amiodarone  200 mg Per Tube Daily  . ampicillin-sulbactam (UNASYN) IV  1.5 g Intravenous Q8H  . antiseptic oral rinse  15 mL Mouth Rinse q12n4p  . chlorhexidine  15 mL Mouth Rinse BID  . famotidine  20 mg Oral BID  . ipratropium  0.5  mg Nebulization BID  . levETIRAcetam  500 mg Oral BID  . metoprolol tartrate  25 mg Oral BID   Family History Family History  Problem Relation Age of Onset  . Other      negative for premature CAD.    Social History History   Social History  . Marital Status: Married    Spouse Name: N/A    Number of Children: N/A  . Years of Education: N/A   Occupational History  . Not on file.   Social History Main Topics  . Smoking status: Former Smoker    Quit date: 11/26/1968  . Smokeless tobacco: Not on file  . Alcohol Use: No  . Drug Use: No  . Sexual Activity:      Other Topics Concern  . Not on file   Social History Narrative   Lives in [place] by herself.  Daughter lives in [place].    Review of Systems  General:  No chills, fever, night sweats or weight changes.  Cardiovascular:  No chest pain, +++ dyspnea on exertion, edema, orthopnea, palpitations, paroxysmal nocturnal dyspnea. Dermatological: No rash, lesions/masses Respiratory: No cough, +++ dyspnea on exertion. Urologic: No hematuria, dysuria Abdominal:   No nausea, vomiting, diarrhea, bright red blood per rectum, melena, or hematemesis Neurologic:  +++ visual changes involving the right half of the right eye ("see red spots'), +++ right sided wkns - which has significantly improved.  Mental status has cleared. All other systems reviewed and are otherwise negative except as noted above.  Physical Exam  Blood pressure 119/68, pulse 80, temperature 98.5 F (36.9 C), temperature source Oral, resp. rate 18, height 5\' 4"  (1.626 m), weight 163 lb 11.2 oz (74.254 kg), SpO2 95.00%.  General: Pleasant, NAD Psych: Normal affect. Neuro: Alert and oriented X 3. Moves all extremities spontaneously. Strength 4/5 R, 5/5 L. HEENT: Normal  Neck: Supple without bruits or JVD. Lungs:  Resp regular and unlabored, bibasilar crackles. Heart: RRR no s3, s4, or murmurs. Abdomen: Soft, non-tender, non-distended, BS + x 4.  Extremities: No clubbing, cyanosis or edema. DP/PT/Radials 2+ and equal bilaterally.  Labs  Lab Results  Component Value Date   WBC 7.1 05/23/2013   HGB 10.9* 05/23/2013   HCT 33.7* 05/23/2013   MCV 85.5 05/23/2013   PLT 178 05/23/2013     Recent Labs Lab 05/23/13 0500  NA 138  K 4.3  CL 105  CO2 25  BUN 11  CREATININE 0.64  CALCIUM 8.4  GLUCOSE 116*   Radiology/Studies  Ct Head Wo Contrast  05/19/2013   CLINICAL DATA:  Intracranial hemorrhage follow-up.  EXAM: CT HEAD WITHOUT CONTRAST  IMPRESSION: Left frontal lobe hematoma and surrounding vasogenic edema relatively  similar to prior exam.  Change in appearance of intraventricular blood, less notable within the left ventricle and now visualized within the right ventricle.  Subarachnoid blood now noted most prominent posterior sylvian fissure.  Please see above.  This is a call report.   Electronically Signed   By: Bridgett Larsson M.D.   On: 05/19/2013 11:36   Ct Head Wo Contrast   (if New Onset Seizure And/or Head Trauma)  05/18/2013   CLINICAL DATA:  Unresponsive  EXAM: CT HEAD WITHOUT CONTRAST  IMPRESSION: 1. Left frontal lobe intraparenchymal hematoma with intraventricular hemorrhage within the left lateral ventricle as above. Hemorrhagic infarct with intraventricular extension is suspected. No midline shift or hydrocephalus identified. 2. Atrophy with chronic microvascular ischemic changes.  Critical Value/emergent results were called by telephone at  the time of interpretation on 05/18/2013 at 11:03 PM to Dr.DANIELLE RAY , who verbally acknowledged these results.   Electronically Signed   By: Rise Mu M.D.   On: 05/18/2013 23:08   Dg Chest Portable 1 View  05/20/2013   CLINICAL DATA:  Intubation.  EXAM: PORTABLE CHEST - 1 VIEW  IMPRESSION: 1. Support lines in good anatomic position. 2. Interim clearing of right upper lobe pneumonia. Poor lung volumes with bibasilar atelectasis versus bibasilar pneumonia noted on today's exam.   Electronically Signed   By: Maisie Fus  Register   On: 05/20/2013 07:34   ECG  11/30: Afib 130, lad, poor r prog.  2D Echocardiogram 11.28.2014  Study Conclusions  - Left ventricle: The cavity size was normal. There was mild   focal basal hypertrophy of the septum. Systolic function   was normal. The estimated ejection fraction was in the   range of 55% to 60%. Wall motion was normal; there were no   regional wall motion abnormalities. Doppler parameters are   consistent with abnormal left ventricular relaxation   (grade 1 diastolic dysfunction). - Mitral valve: Mild  regurgitation. - Left atrium: The atrium was moderately dilated. - Pulmonary arteries: Systolic pressure was mildly   increased. PA peak pressure: 41mm Hg (S). _____________   ASSESSMENT AND PLAN  1.  Acute left frontal intracranial hemorrhage:  In setting of xarelto usage for stroke prevention in afib.  Xarelto d/c'd without plan for future anticoagulation beyond asa at this point.  Per neuro.  2.  PAF:  She went into afib last night but is now back in sinus.  She has been maintained in sinus on amiodarone therapy @ home previously.  She did miss one dose of amio on 11/28, but has been back on it since.  Rec continuation of home dose of amiodarone and oral bb therapy.  Echo showed normal LV fxn.  K/Mg have been normal this admission.  Check TSH.  No anticoagulation in the setting of #1.  3.  DOE:  Pt/dtr report doe over the past 3 mos.  Echo showed normal LV fxn.  This can be w/u further in the future by her local cardiologist in [place], once she has recovered from #1.  4.  HTN:  Stable.  5.  Aspiration PNA:  abx per IM.  Signed, Nicolasa Ducking, NP 05/23/2013, 11:46 AM   History and all data above reviewed.  Patient examined.  I agree with the findings as above.  The patient had a CNS bleed as above.  She does have paroxysms of atrial fib.   The patient exam reveals COR:RRR  ,  Lungs: Clear  ,  Abd: Positive bowel sounds, no rebound no guarding, Ext No edema  .  All available labs, radiology testing, previous records reviewed. Agree with documented assessment and plan. Atrial fib.  Amiodarone restarted.  She will remain off of anticoagulation and in the weeks ahead she can discuss this with her primary cardiologist.  I would consider Eliquis in the future.  She will otherwise remain on the meds as listed.  Fayrene Fearing Samreet [place]  12:28 PM  05/23/2013

## 2013-05-23 NOTE — Progress Notes (Deleted)
OT Cancellation Note  Patient Details Name: Ann Kaufman MRN: [id number] DOB: August 07, 1929   Cancelled Treatment:    Reason Eval/Treat Not Completed: Patient unavailable (pt highly confused; with RN/CNA after incontinent episode and agitated)  Kilian Schwartz A 05/23/2013, 2:48 PM

## 2013-05-23 NOTE — Clinical Documentation Improvement (Signed)
  CT Brain on admit identifies "Vasogenic edema". Repeat CT states:"IMPRESSION: Left frontal lobe hematoma and surrounding vasogenic edema relatively similar to prior exam". If you agree with this finding, please add to Notes and carry to DC summary to clarify this patient's severity of illness and risk of mortality. Thank you.  Thank You, Beverley Fiedler ,RN Clinical Documentation Specialist:  [phone]  [facility]- Health Information Management

## 2013-05-23 NOTE — Care Management Note (Signed)
    Page 1 of 1   05/24/2013     12:44:40 PM   CARE MANAGEMENT NOTE 05/24/2013  Patient:  Ann Kaufman, Ann Kaufman   Account Number:  [id number]  Date Initiated:  05/23/2013  Documentation initiated by:  GRAVES-BIGELOW,Treyvon Blahut  Subjective/Objective Assessment:   Pt admitted for Seizures and CVA. Pt intubated/extubated. Hx PAF. Developed symptomatic Afib with RVR. MD tweaking meds and plan will be CIR when medically stable.     Action/Plan:   CM will conitnue to monitor.   Anticipated DC Date:  05/24/2013   Anticipated DC Plan:  IP REHAB FACILITY      DC Planning Services  CM consult      Choice offered to / List presented to:             Status of service:  Completed, signed off Medicare Important Message given?   (If response is "NO", the following Medicare IM given date fields will be blank) Date Medicare IM given:   Date Additional Medicare IM given:    Discharge Disposition:  IP REHAB FACILITY  Per UR Regulation:  Reviewed for med. necessity/level of care/duration of stay  If discussed at Long Length of Stay Meetings, dates discussed:   05/24/2013    Comments:  05-24-13 1242 Roselyn BeringMitzie Na, [place] [phone] Pt planned for d/c today to CIR.

## 2013-05-23 NOTE — Progress Notes (Signed)
Occupational Therapy Treatment Patient Details Name: Ann Kaufman MRN: [id number] DOB: 1929-10-17 Today's Date: 05/23/2013 Time: 4098-1191 OT Time Calculation (min): 24 min  OT Assessment / Plan / Recommendation  History of present illness Brought by EMS to [facility] ED as code stroke and seizure. Intubated due to AMS with compromised airway and resp distress. CT head revealed Lt frontal ICH. PMH of CAF, chronic anticoagulation. Lives independently @ baseline.    OT comments  Patient limited by buttock/posterior hip pain during mobility. RN notified of patient's request for pain medications.  Follow Up Recommendations  CIR    Barriers to Discharge       Equipment Recommendations  3 in 1 bedside comode    Recommendations for Other Services Rehab consult  Frequency Min 3X/week   Progress towards OT Goals Progress towards OT goals: Progressing toward goals  Plan Discharge plan remains appropriate    Precautions / Restrictions Precautions Precautions: Fall   Pertinent Vitals/Pain C/o pain buttocks/posterior hip. RN made aware    ADL  Eating/Feeding: Supervision/safety Where Assessed - Eating/Feeding: Chair Grooming: Min guard Where Assessed - Grooming: Unsupported standing Upper Body Dressing: Minimal assistance Where Assessed - Upper Body Dressing: Unsupported sitting Toilet Transfer: Simulated;Minimal assistance Toilet Transfer Method:  (ambulating) Toilet Transfer Equipment: Comfort height toilet Equipment Used: Gait belt;Rolling walker Transfers/Ambulation Related to ADLs: Min A in room with RW. Patient c/o buttock/posterior hip pain with movement. Back to bed at end of session and RN notified. ADL Comments: affected by higher level cognitive deficits. Bumping iinto objects.    OT Diagnosis:    OT Problem List:   OT Treatment Interventions:     OT Goals(current goals can now be found in the care plan section) Acute Rehab OT Goals Patient Stated Goal: to return to  [employer]  Visit Information  Last OT Received On: 05/23/13 Assistance Needed: +1 Reason Eval/Treat Not Completed: Patient at procedure or test/ unavailable (pt highly confused; with RN/CNA after incontinent episode) History of Present Illness: Brought by EMS to [facility] ED as code stroke and seizure. Intubated due to AMS with compromised airway and resp distress. CT head revealed Lt frontal ICH. PMH of CAF, chronic anticoagulation. Lives independently @ baseline.     Subjective Data      Prior Functioning       Cognition  Cognition Arousal/Alertness: Awake/alert Behavior During Therapy: WFL for tasks assessed/performed Overall Cognitive Status: Impaired/Different from baseline Area of Impairment: Orientation;Attention;Memory;Safety/judgement;Awareness;Problem solving Current Attention Level: Sustained Memory: Decreased short-term memory;Decreased recall of precautions Safety/Judgement: Decreased awareness of deficits;Decreased awareness of safety Awareness: Emergent Problem Solving: Requires verbal cues    Mobility  Transfers Sit to Stand: 4: Min assist;With upper extremity assist;With armrests;From chair/3-in-1 Stand to Sit: 4: Min assist;With upper extremity assist;With armrests;To chair/3-in-1 Details for Transfer Assistance: verbal cues to wait until I was ready. Pt impulsive at times.    Exercises      Balance     End of Session OT - End of Session Equipment Utilized During Treatment: Gait belt;Rolling walker Activity Tolerance: Patient limited by pain Patient left: in bed;with call bell/phone within reach;with family/visitor present Nurse Communication: Patient requests pain meds  GO     [name] A 05/23/2013, 4:05 PM

## 2013-05-23 NOTE — Consult Note (Signed)
Physical Medicine and Rehabilitation Consult  Reason for Consult: Speech difficulty, right facial weakness and seizure due to ICH Referring Physician: Dr. Isidoro Donning  HPI: Ann Kaufman is a 79 y.o. female with history of HTN, A fib-on xarelto, PPM; who was admitted on 05/18/13 with speech difficulty, right facial droop and seizure. Treated with ativan and loaded with keppra.  Patient intubated for airway protection and CT head with Left frontal lobe intraparenchymal hematoma with intraventricular hemorrhage within the left lateral ventricle. Patient in acute respiratory distress due to pulmonary edema v/s ALI/ARDS. Empiric antibiotics and BD ordered. Neurology consulted and anticoagulation reversed. Follow up CCT 11/27 with left frontal hematoma and vasogenic edema. 2D echo with EF 55-60% and possible small, oscillating density (? Thrombus; ?vegetation)associated with RA wire vs chiari network. Extubated without difficulty and swallow evaluation without evidence of dysphagia. Therapies initiated and patient noted to have poor reasoning with poor awareness, high level cognitive deficits as well as visual impairments. MD, PT, OT, ST recommending CIR.    Review of Systems  Eyes:       Intermittent spots right eye  Respiratory: Negative for cough and shortness of breath.   Cardiovascular: Negative for chest pain and palpitations.  Gastrointestinal: Negative for heartburn and nausea.  Genitourinary: Negative for urgency and frequency.  Musculoskeletal: Negative for back pain.  Neurological: Positive for headaches.  Psychiatric/Behavioral: The patient does not have insomnia.     Past Medical History  Diagnosis Date  . Pacemaker   . Atrial fibrillation   . Pericardial effusion   . Hypertension   . Depression   . Arthritis    Past Surgical History  Procedure Laterality Date  . Pacemaker insertion    . Abdominal hysterectomy    . Breast lumpectomy    . Appendectomy    . Bowel resection      History reviewed. No pertinent family history.  Social History:  Lives alone. Independent PTA. Per. reports that she quit smoking about 44 years ago. She does not have any smokeless tobacco history on file. She reports that she does not drink alcohol or use illicit drugs.  Allergies: No Known Allergies  Medications Prior to Admission  Medication Sig Dispense Refill  . Acetaminophen (TYLENOL PO) Take 1 tablet by mouth every 6 (six) hours as needed (for pain).      Marland Kitchen amiodarone (PACERONE) 200 MG tablet Take 200 mg by mouth every morning.      . folic acid (FOLVITE) 400 MCG tablet Take 400 mcg by mouth every evening.      . furosemide (LASIX) 40 MG tablet Take 40 mg by mouth every morning.      . metoprolol tartrate (LOPRESSOR) 25 MG tablet Take 25 mg by mouth 2 (two) times daily.      . Nutritional Supplements (JUICE PLUS FIBRE PO) Take 1 tablet by mouth daily.      Marland Kitchen omeprazole (PRILOSEC) 20 MG capsule Take 20 mg by mouth every morning.      . potassium chloride SA (K-DUR,KLOR-CON) 20 MEQ tablet Take 20 mEq by mouth every morning.      . Rivaroxaban (XARELTO) 20 MG TABS tablet Take 20 mg by mouth every evening.      . senna (SENOKOT) 8.6 MG TABS tablet Take 1 tablet by mouth daily as needed for mild constipation or moderate constipation.      . sertraline (ZOLOFT) 50 MG tablet Take 50 mg by mouth at bedtime.      . simvastatin (ZOCOR)  10 MG tablet Take 10 mg by mouth every evening.      . vitamin B-12 (CYANOCOBALAMIN) 1000 MCG tablet Take 1,000 mcg by mouth every evening.      . Vitamin D, Ergocalciferol, (DRISDOL) 50000 UNITS CAPS capsule Take 50,000 Units by mouth every 14 (fourteen) days.        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 Functional Status:   Mobility: Bed Mobility Bed Mobility: Supine to Sit Rolling Left: 5: Supervision Left Sidelying to Sit: 4: Min guard Sitting - Scoot to [place] of Bed: 4: Min guard Transfers Transfers: Sit to Stand;Stand to [employer] Transfers Sit to Stand: 4: Min assist Stand to Sit: 4: Min assist Stand Pivot Transfers: 3: Mod assist Ambulation/Gait Ambulation/Gait Assistance: Not tested (comment)    ADL: ADL Eating/Feeding: Supervision/safety (due to impaired attention) Grooming: Supervision/safety Where Assessed - Grooming: Unsupported standing Upper Body Bathing: Supervision/safety;Set up Where Assessed - Upper Body Bathing: Supported sitting Lower Body Bathing: Minimal assistance Where Assessed - Lower Body Bathing: Supported sit to stand Upper Body Dressing: Supervision/safety;Set up Where Assessed - Upper Body Dressing: Unsupported sitting Lower Body Dressing: Moderate assistance Where Assessed - Lower Body Dressing: Supported sit to stand Toilet Transfer: Minimal assistance Toilet Transfer Method: Other (comment) (ambulating) Toilet Transfer Equipment: Comfort height toilet Equipment Used: Gait belt Transfers/Ambulation Related to ADLs: min A.  ADL Comments: affected by higher level cognitive deficits. Bumping iinto objects.  Cognition: Cognition Overall Cognitive Status: Impaired/Different from baseline Arousal/Alertness: Awake/alert Orientation Level: Oriented X4 Attention: Focused;Sustained Focused Attention: Appears intact Sustained Attention: Appears intact Memory: Impaired Memory Impairment: Storage deficit;Retrieval deficit;Decreased recall of new information Awareness: Impaired Awareness Impairment: Emergent impairment;Intellectual impairment Problem Solving: Impaired Problem Solving Impairment: Verbal complex;Functional complex Executive Function: Sequencing;Organizing;Self Monitoring;Self Correcting;Reasoning Reasoning: Impaired Reasoning Impairment: Verbal  complex;Functional complex;Functional basic Sequencing: Impaired Sequencing Impairment: Functional complex Organizing: Impaired Organizing Impairment: Functional complex Self Monitoring: Impaired Self Monitoring Impairment: Verbal complex;Functional basic Self Correcting: Impaired Self Correcting Impairment: Verbal complex;Functional basic Safety/Judgment: Impaired Cognition Arousal/Alertness: Awake/alert Behavior During Therapy: Flat affect Overall Cognitive Status: Impaired/Different from baseline Area of Impairment: Orientation;Attention;Memory;Safety/judgement;Awareness;Problem solving Orientation Level: Disoriented to;Place;Time;Situation Current Attention Level: Sustained Memory: Decreased recall of precautions;Decreased short-term memory Safety/Judgement: Decreased awareness of safety;Decreased awareness of deficits Awareness: Emergent Problem Solving: Slow processing;Decreased initiation;Requires verbal cues General Comments: decreased slf monitoring Difficult to assess due to: Impaired communication  Blood pressure 123/78, pulse 131, temperature 98.6 F (37 C), temperature source Oral, resp. rate 16, height 5\' 4"  (1.626 m), weight 74.254 kg (163 lb 11.2 oz), SpO2 96.00%. Physical Exam  Nursing note and vitals reviewed. Constitutional: She is oriented to person, place, and time. She appears well-developed and well-nourished.  HENT:  Head: Normocephalic and atraumatic.  Eyes: Conjunctivae are normal. Pupils are equal, round, and reactive to light.  Neck: Normal range of motion. Neck supple. No JVD present. No tracheal deviation present. No thyromegaly present.  Cardiovascular: Normal rate and regular rhythm.   Respiratory: Effort normal and breath sounds normal. No respiratory distress. She has no wheezes.  GI: Soft. Bowel sounds are normal. She exhibits no distension. There is no tenderness.  Lymphadenopathy:    She has no cervical adenopathy.  Neurological: She is alert  and oriented to person, place, and time.  Speech clear. Follows two step commands without difficulty. Mild right sided weakness.  Decreased FTN with right hand. No gross sensory changes.  Speech slightly dysarthric. No obvious word finding issues. Has reasonable insight and awareness.    Psychiatric: She has a normal mood and affect. Her behavior is normal.    Results for orders placed during the hospital encounter of 05/18/13 (from the past 24 hour(s))  GLUCOSE, CAPILLARY     Status: Abnormal   Collection Time    05/22/13  8:04 AM      Result Value Range   Glucose-Capillary 117 (*) 70 - 99 mg/dL   Comment 1 Notify RN     Comment 2 Documented in Chart    GLUCOSE, CAPILLARY     Status: Abnormal   Collection Time    05/22/13 12:01 PM      Result Value Range   Glucose-Capillary 110 (*) 70 - 99 mg/dL   Comment 1 Documented in Chart     Comment 2 Notify RN    GLUCOSE, CAPILLARY     Status: Abnormal   Collection Time    05/22/13  4:12 PM      Result Value Range   Glucose-Capillary 104 (*) 70 - 99 mg/dL   Comment 1 Notify RN     Comment 2 Documented in Chart    GLUCOSE, CAPILLARY     Status: Abnormal   Collection Time    05/22/13  8:05 PM      Result Value Range   Glucose-Capillary 117 (*) 70 - 99 mg/dL  CBC     Status: Abnormal   Collection Time    05/23/13  5:00 AM      Result Value Range   WBC 7.1  4.0 - 10.5 K/uL   RBC 3.94  3.87 - 5.11 MIL/uL   Hemoglobin 10.9 (*) 12.0 - 15.0 g/dL   HCT 16.1 (*) 09.6 - 04.5 %   MCV 85.5  78.0 - 100.0 fL   MCH 27.7  26.0 - 34.0 pg   MCHC 32.3  30.0 - 36.0 g/dL   RDW 40.9  81.1 - 91.4 %   Platelets 178  150 - 400 K/uL  BASIC METABOLIC PANEL     Status: Abnormal   Collection Time    05/23/13  5:00 AM      Result Value Range   Sodium 138  135 - 145 mEq/L   Potassium 4.3  3.5 - 5.1 mEq/L   Chloride 105  96 - 112 mEq/L   CO2 25  19 - 32 mEq/L   Glucose, Bld 116 (*) 70 - 99 mg/dL   BUN 11  6 - 23 mg/dL   Creatinine, Ser 7.82  0.50 - 1.10  mg/dL   Calcium 8.4  8.4 - 95.6 mg/dL   GFR calc non Af Amer 82 (*) >90 mL/min   GFR calc Af Amer >90  >90 mL/min   No results found.  Assessment/Plan: Diagnosis: left frontal hematoma with right hemiparesis, speech/?language issues 1. Does the need for close, 24 hr/day medical supervision in concert with the patient's rehab needs make it unreasonable for this patient to be served in a less intensive setting? Potentially 2. Co-Morbidities requiring supervision/potential complications: afib, htn,  3. Due to bladder management, bowel management, safety, skin/wound care, disease management, medication administration, pain management and patient education, does the patient require 24 hr/day rehab nursing? Yes 4. Does the patient require coordinated care of a physician, rehab nurse, PT (1-2 hrs/day, 5 days/week), OT (1-2 hrs/day, 5 days/week) and SLP (1-2 hrs/day, 5 days/week) to  address physical and functional deficits in the context of the above medical diagnosis(es)? Yes Addressing deficits in the following areas: balance, endurance, locomotion, strength, transferring, bowel/bladder control, bathing, dressing, feeding, grooming, toileting, cognition, speech, language, swallowing and psychosocial support 5. Can the patient actively participate in an intensive therapy program of at least 3 hrs of therapy per day at least 5 days per week? Yes 6. The potential for patient to make measurable gains while on inpatient rehab is excellent 7. Anticipated functional outcomes upon discharge from inpatient rehab are supervision to mod I  with PT, supervision to mod I with OT, supervision to mod I with SLP. 8. Estimated rehab length of stay to reach the above functional goals is: likely around 7 days 9. Does the patient have adequate social supports to accommodate these discharge functional goals? Yes 10. Anticipated D/C setting: Home 11. Anticipated post D/C treatments: HH therapy 12. Overall Rehab/Functional  Prognosis: excellent  RECOMMENDATIONS: This patient's condition is appropriate for continued rehabilitative care in the following setting: CIR (see below) Patient has agreed to participate in recommended program. Yes Note that insurance prior authorization may be required for reimbursement for recommended care.  Comment: I would like a re-evaluation with PT today before we commit to an inpatient rehab stay. Thanks  Ranelle Oyster, MD, [facility] Physical Medicine & Rehabilitation     05/23/2013

## 2013-05-23 NOTE — Progress Notes (Signed)
Patient converted to A-fib, PA on call notified, 5mg  of metoprolol ordered and given, will continue to monitor.

## 2013-05-23 NOTE — Progress Notes (Signed)
Stroke Team Progress Note  HISTORY Ann Kaufman is an 79 y.o. female with a history of atrial fibrillation and pacemaker placement, anticoagulation on Xarelto, and hypertension who was brought to the emergency room 05/18/2013 after developing speech difficulty and right facial droop as well as seizure activity. She was noted to have focal motor seizure activity on arrival in the emergency room in addition to being unresponsive. She was intubated for airway protection following administration of 2 mg of Ativan which stopped seizure activity. Propofol drip was started for sedation as well as seizure management. CT scan of her head showed a 3.6 cm left frontal intraparenchymal hematoma. There was evidence of left lateral ventricular extension. No signs of hydrocephalus were seen. A loading dose of Keppra was given, 1000 mg. Anticoagulation reversing protocol was also ordered. Patient was not a TPA candidate secondary to ICH.   SUBJECTIVE Family at bedside. Pt sitting up on the edge of the bed.  OBJECTIVE Most recent Vital Signs: Filed Vitals:   05/23/13 0400 05/23/13 0700 05/23/13 0929 05/23/13 0953  BP: 123/78 119/68    Pulse: 131 112 80   Temp: 98.6 F (37 C) 98.5 F (36.9 C)    TempSrc: Oral     Resp: 16 18    Height:      Weight:      SpO2: 96% 93%  95%   CBG (last 3)   Recent Labs  05/22/13 1201 05/22/13 1612 05/22/13 2005  GLUCAP 110* 104* 117*    IV Fluid Intake:      MEDICATIONS  . albuterol  2.5 mg Nebulization BID  . amiodarone  200 mg Per Tube Daily  . ampicillin-sulbactam (UNASYN) IV  1.5 g Intravenous Q8H  . antiseptic oral rinse  15 mL Mouth Rinse q12n4p  . chlorhexidine  15 mL Mouth Rinse BID  . famotidine  20 mg Oral BID  . ipratropium  0.5 mg Nebulization BID  . levETIRAcetam  500 mg Oral BID  . metoprolol tartrate  25 mg Oral BID   PRN:  sodium chloride, acetaminophen (TYLENOL) oral liquid 160 mg/5 mL, albuterol, fentaNYL, metoprolol  Diet:  Cardiac   Thin liquids Activity:  As tolerated DVT Prophylaxis:  SCD  CLINICALLY SIGNIFICANT STUDIES Basic Metabolic Panel:   Recent Labs Lab 05/21/13 0740 05/22/13 0504 05/23/13 0500  NA 141 139 138  K 3.3* 4.1 4.3  CL 106 106 105  CO2 27 27 25   GLUCOSE 114* 107* 116*  BUN 10 11 11   CREATININE 0.64 0.68 0.64  CALCIUM 8.2* 8.3* 8.4  MG 2.2 2.2  --   PHOS 2.1* 1.6*  --    Liver Function Tests: No results found for this basename: AST, ALT, ALKPHOS, BILITOT, PROT, ALBUMIN,  in the last 168 hours CBC:   Recent Labs Lab 05/22/13 0504 05/23/13 0500  WBC 6.2 7.1  HGB 10.5* 10.9*  HCT 31.9* 33.7*  MCV 86.0 85.5  PLT 133* 178   Coagulation:   Recent Labs Lab 05/19/13 0230 05/19/13 0820 05/19/13 1345  LABPROT 17.8* 18.4* 16.6*  INR 1.51* 1.58* 1.38   Cardiac Enzymes: No results found for this basename: CKTOTAL, CKMB, CKMBINDEX, TROPONINI,  in the last 168 hours Urinalysis:   Recent Labs Lab 05/19/13 0135  COLORURINE YELLOW  LABSPEC 1.011  PHURINE 5.5  GLUCOSEU NEGATIVE  HGBUR NEGATIVE  BILIRUBINUR NEGATIVE  KETONESUR NEGATIVE  PROTEINUR 30*  UROBILINOGEN 0.2  NITRITE NEGATIVE  LEUKOCYTESUR NEGATIVE   Lipid Panel No results found for this basename:  chol,  trig,  hdl,  cholhdl,  vldl,  ldlcalc   HgbA1C  Lab Results  Component Value Date   HGBA1C 5.9* 05/19/2013    Urine Drug Screen:   No results found for this basename: labopia,  cocainscrnur,  labbenz,  amphetmu,  thcu,  labbarb    Alcohol Level: No results found for this basename: ETH,  in the last 168 hours  CT of the brain   05/19/13  Left frontal lobe hematoma and surrounding vasogenic edema relatively similar to prior exam. Change in appearance of intraventricular blood, less notable within  the left ventricle and now visualized within the right ventricle. Subarachnoid blood now noted most prominent posterior sylvian fissure. 05/18/2013  1. Left frontal lobe intraparenchymal hematoma with  intraventricular  hemorrhage within the left lateral ventricle as above. Hemorrhagic infarct with intraventricular extension is suspected. No midline shift or hydrocephalus identified.  2. Atrophy with chronic microvascular ischemic changes.  MRI/A of the brain  pacer  2D Echocardiogram  The estimated ejection fraction was in the range of 55% to 60%. Wall motion was normal; there were no regional wall motion abnormalities. Possible small, oscillating density (? Thrombus; ?vegetation) associated with RA wire vs chiari network; clinical correlation recommended. Since 11/27/11, percardial and pleural effusions resolved.  Carotid Doppler  Findings suggest 1-39% internal carotid artery stenosis bilaterally. Vertebral arteries are patent with antegrade flow.   CXR    05/20/2013 1. Support lines in good anatomic position.  2. Interim clearing of right upper lobe pneumonia. Poor lung volumes with bibasilar atelectasis versus bibasilar pneumonia noted on today's exam. 05/19/2013 Decreased in degree although incomplete clearance of asymmetric airspace disease. This may represent resolving pulmonary edema. Infectious infiltrate not excluded in the proper clinical setting. Residual left base atelectasis/ infiltrate. Endotracheal tube tip 2.2 cm above the carina. 05/18/2013 1. Satisfactory endotracheal and nasogastric tube placements.  2. Bilateral airspace disease may be due to pneumonia. Noncardiogenic edema is another consideration.   EKG  Sinus rhythm. Borderline prolonged PR interval. Probable left atrial enlargement Incomplete left bundle branch block. LVH with secondary repolarization abnormality Borderline prolonged QT interval  Therapy Recommendations CIR recommended  Physical Exam General: The patient is   alert. The patient is oriented to person, place, not date. Skin: No significant peripheral edema is noted.  Neurologic Exam Mental status: The patient is able to follow commands. Cranial  nerves: Facial symmetry is not present. The left nasolabial fold is depressed, asymmetric smile. Eyes are mid position, minimal dolls. VF are full, restriction of superior gaze. Motor: The patient is able to move the arms and legs symmetrically, with good strength. Sensory examination: The patient has good soft touch sensation on all 4's. Coordination: The patient has good finger-nose-finger and toe-to-finger bilaterally. Gait and station: The gait could not be tested. Reflexes: Deep tendon reflexes are symmetric.   ASSESSMENT [name] is a 79 y.o. female presenting with a left frontal intracranial hemorrhage with intraventricular extension. The patient was on Xarelto prior to admission (has tried pradaxa in the past). The patient is off of antiplatelet medications and anticoagulants at this time due to hemorrhage. She received the FEIBA protocol.    Seizure at the time of presentation. The patient is on Keppra.  Atrial fibrillation, on anticoagulation on admission. Not anticoagulation candidate at time of discharge due to intracerebral hemorrhage.  Hypertension  Pacemaker placement  History of pericardial patient  Depression  Hospital day # 5  TREATMENT/PLAN  Follow CT in 1 week  for ongoing hemorrhage resolution, plan to start aspirin therapy pending confirmed resolution of bleed. Will consider resumption of anticoagulation in the future, depending on pt progress.  CIR recommended at time of discharge  No further stroke workup indicated. Ongoing risk factor control by Primary Care Physician Stroke Service will sign off. Please call once CT done in 1 week related to aspirin resumption Follow up with Dr. Pearlean Brownie, Stroke Clinic, in 2 months.   Annie Main, MSN, RN, ANVP-BC, ANP-BC, [facility] Pager: [phone] 05/23/2013 9:58 AM  I have personally obtained a history, examined the patient, evaluated imaging results, and formulated the assessment  and plan of care. I agree with the above. Delia Heady, MD

## 2013-05-23 NOTE — Progress Notes (Signed)
Patient ID: Ann Kaufman  female  VWU:[id number]    DOB: 1929/08/09    DOA: 05/18/2013  PCP: Lorie Phenix, MD  76 F with PMH of CAF, chronic anticoagulation. Lives independently @ baseline. Brought by EMS to [facility] ED as code stroke and seizure. Intubated due to AMS with compromised airway and resp distress. CT head revealed ICH.  Patient was under critical care service, [facility] service assumed care on 05/22/13   Assessment/Plan:  Acute respiratory distress, pulmonary edema versus ARDS, possible aspiration pneumonia: Improving - Patient was intubated at the time of admission, admitted to ICU, extubated on 05/20/2013, chest x-ray had shown diffuse infiltrates with edema pattern - Currently O2 sats 97% on 2 L, continue scheduled bronchodilators BID, Unasyn (will change to Augmentin at DC), - Complete total one week of antibiotics and will need followup chest x-ray in 4-6 weeks to ensure resolution of pneumonia findings.  Acute left hemispheric intracranial hemorrhage:  - Patient had speech difficulty, right facial droop, seizure activity at the time of admission. Patient was on anticoagulation on xarelto prior to admission due to atrial fibrillation - Neurology/stroke service following - Carotid Dopplers 1-39% ICA stenosis bilaterally - 2-D echo showed EF of 55-60%, normal wall motion, grade 1 diastolic dysfunction, ossible small, oscillating density (? Thrombus; ?vegetation)associated with RA wire vs chiari network. No TEE recommended by neurology - PT, OT recommended CIR, consult placed - Discussed with neurology who recommended followup CT brain in 1 week (results to be forwarded to Dr. Pearlean Brownie) and if hemorrhage is resolving, neurology plans to start aspirin. No further stroke workup recommended. Stroke service has signed off.   Seizure activity - Continue on Keppra  Atrial fibrillation, chronic/RVR-resolved - Continue amiodarone, no anticoagulation due to acute ICH - Patient had rapid  ventricular rate in the 120s overnight on 05/22/13 but has reverted to AV paced rhythm 05/23/13 - Resumed home metoprolol that had been on hold.  Diabetes mellitus -Continue sliding scale insulin   Anemia - Stable   DVT Prophylaxis: SCDs Code Status: Full Disposition: Inpatient rehabilitation M.D. recommends reevaluation by PT today prior to deciding CIR. Patient will probably be medically stable for discharge on 12/2.    Subjective: No complaints, no family at bedside,   Objective: Weight change:   Intake/Output Summary (Last 24 hours) at 05/23/13 1430 Last data filed at 05/23/13 0800  Gross per 24 hour  Intake    550 ml  Output    800 ml  Net   -250 ml   Blood pressure 122/63, pulse 66, temperature 99 F (37.2 C), temperature source Oral, resp. rate 18, height 5\' 4"  (1.626 m), weight 74.254 kg (163 lb 11.2 oz), SpO2 100.00%.  Physical Exam: General: Alert and awake, oriented x3, not in any acute distress. CVS: S1-S2 -was irregularly irregular and tachycardic in the 120s this morning. No JVD, murmurs, gallops or pedal edema. Telemetry: Was in A. fib with RVR in the 120s for approximately 12 hours and converted to sinus rhythm 12/1 AM Chest: clear to auscultation bilaterally, no wheezing, rales or rhonchi Abdomen: soft nontender, nondistended, normal bowel sounds  Extremities: no cyanosis, clubbing or edema noted bilaterally Neuro: Cranial nerves II-XII intact, 5/5 strength in upper and lower extremities bilaterally   Lab Results: Basic Metabolic Panel:  Recent Labs Lab 05/22/13 0504 05/23/13 0500  NA 139 138  K 4.1 4.3  CL 106 105  CO2 27 25  GLUCOSE 107* 116*  BUN 11 11  CREATININE 0.68 0.64  CALCIUM 8.3* 8.4  MG 2.2  --   PHOS 1.6*  --    Liver Function Tests: No results found for this basename: AST, ALT, ALKPHOS, BILITOT, PROT, ALBUMIN,  in the last 168 hours No results found for this basename: LIPASE, AMYLASE,  in the last 168 hours No results found  for this basename: AMMONIA,  in the last 168 hours CBC:  Recent Labs Lab 05/22/13 0504 05/23/13 0500  WBC 6.2 7.1  HGB 10.5* 10.9*  HCT 31.9* 33.7*  MCV 86.0 85.5  PLT 133* 178   Cardiac Enzymes: No results found for this basename: CKTOTAL, CKMB, CKMBINDEX, TROPONINI,  in the last 168 hours BNP: No components found with this basename: POCBNP,  CBG:  Recent Labs Lab 05/22/13 0441 05/22/13 0804 05/22/13 1201 05/22/13 1612 05/22/13 2005  GLUCAP 110* 117* 110* 104* 117*     Micro Results: Recent Results (from the past 240 hour(s))  CULTURE, RESPIRATORY (NON-EXPECTORATED)     Status: None   Collection Time    05/19/13 12:01 AM      Result Value Range Status   Specimen Description SPUTUM   Final   Special Requests NONE   Final   Gram Stain     Final   Value: NO WBC SEEN     NO SQUAMOUS EPITHELIAL CELLS SEEN     FEW GRAM NEGATIVE RODS     RARE GRAM POSITIVE COCCI     IN PAIRS RARE GRAM POSITIVE RODS   Culture     Final   Value: NORMAL OROPHARYNGEAL FLORA     Performed at [employer] [facility]   Report Status 05/21/2013 FINAL   Final  MRSA PCR SCREENING     Status: None   Collection Time    05/19/13  1:35 AM      Result Value Range Status   MRSA by PCR NEGATIVE  NEGATIVE Final   Comment:            The GeneXpert MRSA Assay (FDA     approved for NASAL specimens     only), is one component of a     comprehensive MRSA colonization     surveillance program. It is not     intended to diagnose MRSA     infection nor to guide or     monitor treatment for     MRSA infections.    Studies/Results: Ct Head Wo Contrast  05/19/2013   CLINICAL DATA:  Intracranial hemorrhage follow-up.  EXAM: CT HEAD WITHOUT CONTRAST  TECHNIQUE: Contiguous axial images were obtained from the base of the skull through the vertex without intravenous contrast.  COMPARISON:  05/18/2013.  FINDINGS: Left frontal lobe hematoma with maximal transverse dimension of 4 x 3.8 cm similar to prior exam.  Mild surrounding vasogenic edema. Breakthrough of hemorrhage into the left lateral ventricle. Decrease in amount of blood within the left lateral ventricle now with spread of intraventricular blood within the dependent aspect of both ventricles. Interval development of subarachnoid blood most notable in the posterior sylvian fissure in greater on the left.  The cause of the left frontal lobe hematoma is indeterminate. Although it is possible this is related to hemorrhagic transformation of an infarct as question on the prior examination, it is also possible that this represents result of amyloid angiography. Underlying mass or vascular malformation cannot be excluded. This will need to be followed until clear to evaluate for possibility of underlying mass or vascular malformation. No history of trauma to suggest this represents result of  Injury. No obvious skull fracture.  IMPRESSION: Left frontal lobe hematoma and surrounding vasogenic edema relatively similar to prior exam.  Change in appearance of intraventricular blood, less notable within the left ventricle and now visualized within the right ventricle.  Subarachnoid blood now noted most prominent posterior sylvian fissure.  Please see above.  This is a call report.   Electronically Signed   By: Bridgett Larsson M.D.   On: 05/19/2013 11:36   Ct Head Wo Contrast   (if New Onset Seizure And/or Head Trauma)  05/18/2013   CLINICAL DATA:  Unresponsive  EXAM: CT HEAD WITHOUT CONTRAST  TECHNIQUE: Contiguous axial images were obtained from the base of the skull through the vertex without intravenous contrast.  COMPARISON:  None.  FINDINGS: A left frontal intraparenchymal hematoma measuring 3.6 x 3.8 cm is seen (series 2, image 16). This hematoma involves the overlying cortical gray matter as well as the subcortical white matter, and likely the left caudate head. There is a small amount of localized edema. Hyperdense intraventricular hemorrhage is seen within the left  lateral ventricle as well (series 2, image 18). No significant midline shift identified at this time. There is no hydrocephalus. No acute intracranial hemorrhage seen within the right cerebral hemisphere. There is no extra-axial fluid collection.  Mild atrophy with chronic microvascular ischemic changes are present. No mass lesion identified. The calvarium is intact.  Paranasal sinuses and mastoid air cells are clear.  Endotracheal tube is partially visualized.  IMPRESSION: 1. Left frontal lobe intraparenchymal hematoma with intraventricular hemorrhage within the left lateral ventricle as above. Hemorrhagic infarct with intraventricular extension is suspected. No midline shift or hydrocephalus identified. 2. Atrophy with chronic microvascular ischemic changes.  Critical Value/emergent results were called by telephone at the time of interpretation on 05/18/2013 at 11:03 PM to Dr.DANIELLE RAY , who verbally acknowledged these results.   Electronically Signed   By: Rise Mu M.D.   On: 05/18/2013 23:08   Dg Chest Portable 1 View  05/20/2013   CLINICAL DATA:  Intubation.  EXAM: PORTABLE CHEST - 1 VIEW  COMPARISON:  05/19/2013.  05/18/2013.  FINDINGS: Endotracheal tube in good anatomic position 3 cm above the carina. NG tube noted in good position. Cardiac pacer noted with lead tips in the right atrium and right ventricle. Previously identified right upper lobe infiltrate has largely cleared. Poor lung volumes with basilar atelectasis versus basilar pneumonia. No pleural effusion or pneumothorax. No acute osseous abnormality.  IMPRESSION: 1. Support lines in good anatomic position. 2. Interim clearing of right upper lobe pneumonia. Poor lung volumes with bibasilar atelectasis versus bibasilar pneumonia noted on today's exam.   Electronically Signed   By: Maisie Fus  Register   On: 05/20/2013 07:34   Dg Chest Port 1 View  05/19/2013   CLINICAL DATA:  Endotracheal tube evaluation.  EXAM: PORTABLE CHEST - 1  VIEW  COMPARISON:  05/18/2013.  FINDINGS: Endotracheal tube tip is a 2.2 cm above the carina.  Nasogastric tube courses below the diaphragm. Tip is not included on the present exam.  Sequential pacemaker enters from the left with leads unchanged.  Heart is slightly enlarged.  Decreased in degree although incomplete clearance of asymmetric airspace disease. This may represent resolving pulmonary edema. Infectious infiltrate not excluded in the proper clinical setting. Residual left base atelectasis/ infiltrate.  No gross pneumothorax.  Calcified aorta.  IMPRESSION: Decreased in degree although incomplete clearance of asymmetric airspace disease. This may represent resolving pulmonary edema. Infectious infiltrate not excluded in the proper  clinical setting. Residual left base atelectasis/ infiltrate.  Endotracheal tube tip 2.2 cm above the carina.   Electronically Signed   By: Bridgett Larsson M.D.   On: 05/19/2013 08:04   Dg Chest Portable 1 View  05/18/2013   CLINICAL DATA:  Post intubation.  EXAM: PORTABLE CHEST - 1 VIEW  COMPARISON:  CT chest and chest radiograph 11/26/2011.  FINDINGS: Endotracheal tube terminates 3.1 cm above the carina. Nasogastric tube is followed into the stomach. Left subclavian pacemaker lead tips project over the right atrium and right ventricle. Heart size normal. There is upper and midlung zone predominant airspace disease. No pleural fluid.  IMPRESSION: 1. Satisfactory endotracheal and nasogastric tube placements. 2. Bilateral airspace disease may be due to pneumonia. Noncardiogenic edema is another consideration.   Electronically Signed   By: Leanna Battles M.D.   On: 05/18/2013 21:25    Medications: Scheduled Meds: . albuterol  2.5 mg Nebulization BID  . amiodarone  200 mg Per Tube Daily  . ampicillin-sulbactam (UNASYN) IV  1.5 g Intravenous Q8H  . antiseptic oral rinse  15 mL Mouth Rinse q12n4p  . chlorhexidine  15 mL Mouth Rinse BID  . famotidine  20 mg Oral BID  .  ipratropium  0.5 mg Nebulization BID  . levETIRAcetam  500 mg Oral BID  . metoprolol tartrate  25 mg Oral BID      LOS: 5 days   [facility] M.D. [facility] 05/23/2013, 2:30 PM Pager: [phone]  If 7PM-7AM, please contact night-coverage [email or web address] Password [email or web address]

## 2013-05-24 ENCOUNTER — Encounter (HOSPITAL_COMMUNITY): Payer: Self-pay | Admitting: *Deleted

## 2013-05-24 ENCOUNTER — Inpatient Hospital Stay (HOSPITAL_COMMUNITY)
Admission: RE | Admit: 2013-05-24 | Discharge: 2013-06-09 | DRG: 945 | Disposition: A | Discharge status: Home / Self Care | Payer: Medicare Other | Source: Intra-hospital | Attending: Physical Medicine & Rehabilitation | Admitting: Physical Medicine & Rehabilitation

## 2013-05-24 DIAGNOSIS — Z79899 Other long term (current) drug therapy: Secondary | ICD-10-CM

## 2013-05-24 DIAGNOSIS — Z950 Presence of cardiac pacemaker: Secondary | ICD-10-CM | POA: Diagnosis not present

## 2013-05-24 DIAGNOSIS — F3289 Other specified depressive episodes: Secondary | ICD-10-CM | POA: Diagnosis present

## 2013-05-24 DIAGNOSIS — R4701 Aphasia: Secondary | ICD-10-CM | POA: Diagnosis present

## 2013-05-24 DIAGNOSIS — M25579 Pain in unspecified ankle and joints of unspecified foot: Secondary | ICD-10-CM | POA: Diagnosis not present

## 2013-05-24 DIAGNOSIS — R29810 Facial weakness: Secondary | ICD-10-CM | POA: Diagnosis present

## 2013-05-24 DIAGNOSIS — Z5189 Encounter for other specified aftercare: Secondary | ICD-10-CM | POA: Diagnosis not present

## 2013-05-24 DIAGNOSIS — I619 Nontraumatic intracerebral hemorrhage, unspecified: Secondary | ICD-10-CM | POA: Diagnosis present

## 2013-05-24 DIAGNOSIS — M47817 Spondylosis without myelopathy or radiculopathy, lumbosacral region: Secondary | ICD-10-CM | POA: Diagnosis present

## 2013-05-24 DIAGNOSIS — R569 Unspecified convulsions: Secondary | ICD-10-CM | POA: Diagnosis present

## 2013-05-24 DIAGNOSIS — F329 Major depressive disorder, single episode, unspecified: Secondary | ICD-10-CM | POA: Diagnosis present

## 2013-05-24 DIAGNOSIS — F05 Delirium due to known physiological condition: Secondary | ICD-10-CM | POA: Diagnosis present

## 2013-05-24 DIAGNOSIS — M545 Low back pain, unspecified: Secondary | ICD-10-CM | POA: Diagnosis not present

## 2013-05-24 DIAGNOSIS — I480 Paroxysmal atrial fibrillation: Secondary | ICD-10-CM | POA: Diagnosis present

## 2013-05-24 DIAGNOSIS — Z7901 Long term (current) use of anticoagulants: Secondary | ICD-10-CM | POA: Diagnosis not present

## 2013-05-24 DIAGNOSIS — I4891 Unspecified atrial fibrillation: Secondary | ICD-10-CM | POA: Diagnosis not present

## 2013-05-24 DIAGNOSIS — J189 Pneumonia, unspecified organism: Secondary | ICD-10-CM | POA: Diagnosis not present

## 2013-05-24 DIAGNOSIS — Z87891 Personal history of nicotine dependence: Secondary | ICD-10-CM

## 2013-05-24 DIAGNOSIS — J4489 Other specified chronic obstructive pulmonary disease: Secondary | ICD-10-CM | POA: Diagnosis not present

## 2013-05-24 DIAGNOSIS — J690 Pneumonitis due to inhalation of food and vomit: Secondary | ICD-10-CM | POA: Diagnosis present

## 2013-05-24 DIAGNOSIS — J42 Unspecified chronic bronchitis: Secondary | ICD-10-CM | POA: Diagnosis not present

## 2013-05-24 DIAGNOSIS — R55 Syncope and collapse: Secondary | ICD-10-CM | POA: Diagnosis not present

## 2013-05-24 DIAGNOSIS — G8929 Other chronic pain: Secondary | ICD-10-CM | POA: Diagnosis present

## 2013-05-24 DIAGNOSIS — S8990XA Unspecified injury of unspecified lower leg, initial encounter: Secondary | ICD-10-CM | POA: Diagnosis not present

## 2013-05-24 DIAGNOSIS — R4182 Altered mental status, unspecified: Secondary | ICD-10-CM

## 2013-05-24 DIAGNOSIS — IMO0002 Reserved for concepts with insufficient information to code with codable children: Secondary | ICD-10-CM | POA: Diagnosis present

## 2013-05-24 DIAGNOSIS — J9600 Acute respiratory failure, unspecified whether with hypoxia or hypercapnia: Secondary | ICD-10-CM | POA: Diagnosis not present

## 2013-05-24 DIAGNOSIS — S0003XA Contusion of scalp, initial encounter: Secondary | ICD-10-CM | POA: Diagnosis not present

## 2013-05-24 DIAGNOSIS — I10 Essential (primary) hypertension: Secondary | ICD-10-CM | POA: Diagnosis not present

## 2013-05-24 DIAGNOSIS — J159 Unspecified bacterial pneumonia: Secondary | ICD-10-CM

## 2013-05-24 DIAGNOSIS — J449 Chronic obstructive pulmonary disease, unspecified: Secondary | ICD-10-CM | POA: Diagnosis not present

## 2013-05-24 LAB — T3, FREE: T3, Free: 2.5 pg/mL (ref 2.3–4.2)

## 2013-05-24 LAB — T4, FREE: Free T4: 1.46 ng/dL (ref 0.80–1.80)

## 2013-05-24 LAB — TSH: TSH: 6.144 u[IU]/mL — ABNORMAL HIGH (ref 0.350–4.500)

## 2013-05-24 MED ORDER — BIOTENE DRY MOUTH MT LIQD
15.0000 mL | Freq: Two times a day (BID) | OROMUCOSAL | Status: DC
  Administered 2013-05-25 – 2013-06-08 (×16): 15 mL via OROMUCOSAL

## 2013-05-24 MED ORDER — METOPROLOL TARTRATE 25 MG PO TABS
25.0000 mg | ORAL_TABLET | Freq: Two times a day (BID) | ORAL | Status: DC
  Administered 2013-05-25 – 2013-06-09 (×28): 25 mg via ORAL
  Filled 2013-05-24 (×35): qty 1

## 2013-05-24 MED ORDER — PROCHLORPERAZINE EDISYLATE 5 MG/ML IJ SOLN
5.0000 mg | Freq: Four times a day (QID) | INTRAMUSCULAR | Status: DC | PRN
  Filled 2013-05-24: qty 2

## 2013-05-24 MED ORDER — CHLORHEXIDINE GLUCONATE 0.12 % MT SOLN
15.0000 mL | Freq: Two times a day (BID) | OROMUCOSAL | Status: DC
  Administered 2013-05-25 – 2013-06-02 (×14): 15 mL via OROMUCOSAL
  Filled 2013-05-24 (×20): qty 15

## 2013-05-24 MED ORDER — ALUM & MAG HYDROXIDE-SIMETH 200-200-20 MG/5ML PO SUSP: 30.0000 mL | ORAL | Status: DC | PRN

## 2013-05-24 MED ORDER — TRAZODONE HCL 50 MG PO TABS
25.0000 mg | ORAL_TABLET | Freq: Every evening | ORAL | Status: DC | PRN
  Administered 2013-05-25 – 2013-05-27 (×2): 50 mg via ORAL
  Filled 2013-05-24 (×4): qty 1

## 2013-05-24 MED ORDER — SODIUM CHLORIDE 0.9 % IV SOLN
1.5000 g | Freq: Three times a day (TID) | INTRAVENOUS | Status: AC
  Filled 2013-05-24 (×3): qty 1.5

## 2013-05-24 MED ORDER — DIPHENHYDRAMINE HCL 12.5 MG/5ML PO ELIX: 12.5000 mg | ORAL_SOLUTION | Freq: Four times a day (QID) | ORAL | Status: DC | PRN

## 2013-05-24 MED ORDER — AMIODARONE HCL 200 MG PO TABS
200.0000 mg | ORAL_TABLET | Freq: Every day | ORAL | Status: DC
  Administered 2013-05-25 – 2013-06-09 (×16): 200 mg via ORAL
  Filled 2013-05-24 (×17): qty 1

## 2013-05-24 MED ORDER — PROCHLORPERAZINE MALEATE 5 MG PO TABS
5.0000 mg | ORAL_TABLET | Freq: Four times a day (QID) | ORAL | Status: DC | PRN
Start: 2013-05-24 — End: 2013-06-09
  Administered 2013-05-25: 5 mg via ORAL
  Filled 2013-05-24: qty 2

## 2013-05-24 MED ORDER — TRAMADOL HCL 50 MG PO TABS
50.0000 mg | ORAL_TABLET | Freq: Four times a day (QID) | ORAL | Status: DC | PRN
  Administered 2013-05-24 – 2013-05-26 (×5): 50 mg via ORAL
  Filled 2013-05-24 (×5): qty 1

## 2013-05-24 MED ORDER — METHOCARBAMOL 500 MG PO TABS
500.0000 mg | ORAL_TABLET | Freq: Four times a day (QID) | ORAL | Status: DC | PRN
  Administered 2013-05-24 – 2013-05-26 (×3): 500 mg via ORAL
  Filled 2013-05-24 (×4): qty 1

## 2013-05-24 MED ORDER — FLEET ENEMA 7-19 GM/118ML RE ENEM: 1.0000 | ENEMA | Freq: Once | RECTAL | Status: AC | PRN

## 2013-05-24 MED ORDER — GUAIFENESIN-DM 100-10 MG/5ML PO SYRP: 5.0000 mL | ORAL_SOLUTION | Freq: Four times a day (QID) | ORAL | Status: DC | PRN

## 2013-05-24 MED ORDER — ACETAMINOPHEN 325 MG PO TABS
325.0000 mg | ORAL_TABLET | ORAL | Status: DC | PRN
  Administered 2013-05-25 – 2013-05-26 (×3): 650 mg via ORAL
  Filled 2013-05-24 (×4): qty 2

## 2013-05-24 MED ORDER — LEVETIRACETAM 500 MG PO TABS
500.0000 mg | ORAL_TABLET | Freq: Two times a day (BID) | ORAL | Status: DC
  Administered 2013-05-25 – 2013-05-31 (×11): 500 mg via ORAL
  Filled 2013-05-24 (×17): qty 1

## 2013-05-24 MED ORDER — FAMOTIDINE 20 MG PO TABS
20.0000 mg | ORAL_TABLET | Freq: Two times a day (BID) | ORAL | Status: DC
Start: 2013-05-24 — End: 2013-06-09
  Administered 2013-05-25 – 2013-06-09 (×28): 20 mg via ORAL
  Filled 2013-05-24 (×35): qty 1

## 2013-05-24 MED ORDER — ALPRAZOLAM 0.25 MG PO TABS
0.2500 mg | ORAL_TABLET | Freq: Two times a day (BID) | ORAL | Status: DC | PRN
Start: 2013-05-24 — End: 2013-06-09
  Administered 2013-05-28 – 2013-06-09 (×2): 0.25 mg via ORAL
  Filled 2013-05-24 (×7): qty 1

## 2013-05-24 MED ORDER — ALBUTEROL SULFATE (5 MG/ML) 0.5% IN NEBU
2.5000 mg | INHALATION_SOLUTION | RESPIRATORY_TRACT | Status: DC | PRN
  Filled 2013-05-24: qty 0.5

## 2013-05-24 MED ORDER — BISACODYL 10 MG RE SUPP
10.0000 mg | Freq: Every day | RECTAL | Status: DC | PRN
  Administered 2013-05-30: 10 mg via RECTAL
  Filled 2013-05-24: qty 1

## 2013-05-24 MED ORDER — PROCHLORPERAZINE 25 MG RE SUPP
12.5000 mg | Freq: Four times a day (QID) | RECTAL | Status: DC | PRN
  Filled 2013-05-24: qty 1

## 2013-05-24 MED ORDER — ALPRAZOLAM 0.25 MG PO TABS
0.2500 mg | ORAL_TABLET | Freq: Once | ORAL | Status: AC
  Administered 2013-05-24: 0.25 mg via ORAL
  Filled 2013-05-24: qty 1

## 2013-05-24 MED ORDER — LEVETIRACETAM 500 MG PO TABS: 500.0000 mg | ORAL_TABLET | Freq: Two times a day (BID) | ORAL | Status: DC

## 2013-05-24 NOTE — Discharge Summary (Addendum)
Physician Discharge Summary  CHRISTYNE MCCAIN WUJ:[id number] DOB: 09-12-1929 DOA: 05/18/2013  PCP: Lorie Phenix, MD  Admit date: 05/18/2013 Discharge date: 05/24/2013  Time spent: Greater than 30 minutes  Recommendations for Outpatient Follow-up:  1. Dr. Lorie Phenix, PCP in 2 weeks. 2. Dr. Anthoney Harada, [facility] in 2 weeks. 3. Dr. Delia Heady, Neurology in 2 months. 4. Followup free T3 and free T4 that were sent from the hospital on 05/24/13. She may need a repeat TFTs in 4-6 weeks. She is clinically Euthyroid. She however is on Amiodarone. 5. Recommend repeating chest x-ray in 4-6 weeks to ensure resolution of pneumonia findings. 6. Recommend SLP consultation and followup at rehabilitation center. 7. Repeat CT head in 1 week from discharge and discuss results with the neurologist Dr. Pearlean Brownie regarding starting aspirin for secondary stroke prevention.  Discharge Diagnoses:  Principal Problem:   ICH (intracerebral hemorrhage) Active Problems:   Atrial fibrillation, chronic   Acute respiratory failure   Chronic anticoagulation   Aspiration pneumonia   Discharge Condition: Improved & Stable  Diet recommendation: Heart healthy diet  Filed Weights   05/21/13 0500 05/22/13 0500 05/24/13 0410  Weight: 73.7 kg (162 lb 7.7 oz) 74.254 kg (163 lb 11.2 oz) 78.291 kg (172 lb 9.6 oz)    History of present illness:  63 F with PMH of HTN, CAF on chronic anticoagulation with Xarelto, PPM who lived independently @ baseline was brought by EMS to [facility] ED on 05/18/13 as code stroke and seizure. She had speech difficulty, right facial droop and seizures. Intubated due to AMS with compromised airway and resp distress. CT head revealed ICH. She was admitted by the critical care team and after she was extubated and stabilized, her care was transferred to the hospitalist service.  Hospital Course:   Acute respiratory distress, pulmonary edema versus ARDS, possible aspiration pneumonia -  Patient was intubated at the time of admission, admitted to ICU, extubated on 05/20/2013, chest x-ray had shown diffuse infiltrates with edema pattern  - Patient was empirically treated with IV Unasyn and has completed almost 7 days course and will not be continued at discharge. - Hypoxia has resolved and patient is breathing spontaneously and is in no obvious distress. - Recommend repeating chest x-ray in 4-6 weeks to ensure resolution of pneumonia findings.  Acute left hemispheric intracranial hemorrhage:  - Patient had speech difficulty, right facial droop, seizure activity at the time of admission. Patient was on anticoagulation on xarelto prior to admission due to atrial fibrillation  - Xarelto DCed - Neurology/stroke service consulted - Carotid Dopplers 1-39% ICA stenosis bilaterally  - 2-D echo showed EF of 55-60%, normal wall motion, grade 1 diastolic dysfunction, ossible small, oscillating density (? Thrombus; ?vegetation)associated with RA wire vs chiari network. No TEE recommended by neurology  - PT, OT recommended CIR - Discussed with neurology who recommended followup CT brain in 1 week (results to be forwarded to Dr. Pearlean Brownie) and if hemorrhage is resolving, neurology plans to start aspirin. No further stroke workup recommended. Stroke service has signed off.  - Left frontal lobe hematoma and surrounding vasogenic edema relatively similar to prior exam by follow up CT 05/19/13. Follow up CT as stated above..   Seizure activity  - Continue on Keppra. No further seizures.   Atrial fibrillation, chronic/RVR-resolved  - Continue amiodarone, no anticoagulation due to acute ICH  - Patient had rapid ventricular rate in the 120s overnight on 05/22/13 but reverted to AV paced rhythm/sinus rhythm 05/23/13  - Resumed home  metoprolol that had been on hold.   ? Diabetes mellitus/PreDM  - Diet controlled at home.  Anemia  - Stable. Periodic followup of  CBCs.   Consultations:  Cardiology  Critical care medicine  Neurology/stroke team  Rehabilitation M.D.  Procedures:  ETT    Discharge Exam:  Complaints:  Denies complaints.  Filed Vitals:   05/24/13 0410 05/24/13 0758 05/24/13 0823 05/24/13 1004  BP: 130/73  123/49 137/67  Pulse: 69  74 74  Temp: 98.7 F (37.1 C)  99.2 F (37.3 C)   TempSrc: Oral     Resp: 18  16   Height:      Weight: 78.291 kg (172 lb 9.6 oz)     SpO2: 91% 88% 94%     General: Alert and awake, oriented x3, not in any acute distress.  CVS: S1-S2 RRR. No JVD, murmurs, gallops or pedal edema. Telemetry: Sinus rhythm. Intermittently A paced rhythm  Chest: clear to auscultation bilaterally, no wheezing, rales or rhonchi. No increased work of breathing  Abdomen: soft nontender, nondistended, normal bowel sounds  Extremities: no cyanosis, clubbing or edema noted bilaterally  Neuro: Cranial nerves II-XII intact, 5/5 strength in upper and lower extremities bilaterally    Discharge Instructions      Discharge Orders   Future Orders Complete By Expires   (HEART FAILURE PATIENTS) Call MD:  Anytime you have any of the following symptoms: 1) 3 pound weight gain in 24 hours or 5 pounds in 1 week 2) shortness of breath, with or without a dry hacking cough 3) swelling in the hands, feet or stomach 4) if you have to sleep on extra pillows at night in order to breathe.  As directed    Call MD for:  difficulty breathing, headache or visual disturbances  As directed    Call MD for:  extreme fatigue  As directed    Call MD for:  persistant dizziness or light-headedness  As directed    Call MD for:  severe uncontrolled pain  As directed    Call MD for:  temperature >100.4  As directed    Call MD for:  As directed    Comments:     Strokelike symptoms or seizure like activity.   Diet - low sodium heart healthy  As directed    Increase activity slowly  As directed        Medication List    STOP taking these  medications       XARELTO 20 MG Tabs tablet  Generic drug:  Rivaroxaban      TAKE these medications       amiodarone 200 MG tablet  Commonly known as:  PACERONE  Take 200 mg by mouth every morning.     folic acid 400 MCG tablet  Commonly known as:  FOLVITE  Take 400 mcg by mouth every evening.     furosemide 40 MG tablet  Commonly known as:  LASIX  Take 40 mg by mouth every morning.     JUICE PLUS FIBRE PO  Take 1 tablet by mouth daily.     levETIRAcetam 500 MG tablet  Commonly known as:  KEPPRA  Take 1 tablet (500 mg total) by mouth 2 (two) times daily.     metoprolol tartrate 25 MG tablet  Commonly known as:  LOPRESSOR  Take 25 mg by mouth 2 (two) times daily.     omeprazole 20 MG capsule  Commonly known as:  PRILOSEC  Take 20 mg by mouth  every morning.     potassium chloride SA 20 MEQ tablet  Commonly known as:  K-DUR,KLOR-CON  Take 20 mEq by mouth every morning.     senna 8.6 MG Tabs tablet  Commonly known as:  SENOKOT  Take 1 tablet by mouth daily as needed for mild constipation or moderate constipation.     sertraline 50 MG tablet  Commonly known as:  ZOLOFT  Take 50 mg by mouth at bedtime.     simvastatin 10 MG tablet  Commonly known as:  ZOCOR  Take 10 mg by mouth every evening.     TYLENOL PO  Take 1 tablet by mouth every 6 (six) hours as needed (for pain).     vitamin B-12 1000 MCG tablet  Commonly known as:  CYANOCOBALAMIN  Take 1,000 mcg by mouth every evening.     Vitamin D (Ergocalciferol) 50000 UNITS Caps capsule  Commonly known as:  DRISDOL  Take 50,000 Units by mouth every 14 (fourteen) days.       Follow-up Information   Follow up with Gates Rigg, MD. Schedule an appointment as soon as possible for a visit in 2 months. (stroke clinic)    Specialties:  Neurology, Radiology   Contact information:   [address] [place] [address] [phone]       Follow up with Dr. Anthoney Harada [facility].  Schedule an appointment as soon as possible for a visit in 2 weeks.      Follow up with MALONEY,NANCY, MD. Schedule an appointment as soon as possible for a visit in 2 weeks.   Specialty:  Family Medicine   Contact information:   [address] [place] [address] [phone]        The results of significant diagnostics from this hospitalization (including imaging, microbiology, ancillary and laboratory) are listed below for reference.    Significant Diagnostic Studies: Ct Head Wo Contrast  05/19/2013   CLINICAL DATA:  Intracranial hemorrhage follow-up.  EXAM: CT HEAD WITHOUT CONTRAST  TECHNIQUE: Contiguous axial images were obtained from the base of the skull through the vertex without intravenous contrast.  COMPARISON:  05/18/2013.  FINDINGS: Left frontal lobe hematoma with maximal transverse dimension of 4 x 3.8 cm similar to prior exam. Mild surrounding vasogenic edema. Breakthrough of hemorrhage into the left lateral ventricle. Decrease in amount of blood within the left lateral ventricle now with spread of intraventricular blood within the dependent aspect of both ventricles. Interval development of subarachnoid blood most notable in the posterior sylvian fissure in greater on the left.  The cause of the left frontal lobe hematoma is indeterminate. Although it is possible this is related to hemorrhagic transformation of an infarct as question on the prior examination, it is also possible that this represents result of amyloid angiography. Underlying mass or vascular malformation cannot be excluded. This will need to be followed until clear to evaluate for possibility of underlying mass or vascular malformation. No history of trauma to suggest this represents result of Injury. No obvious skull fracture.  IMPRESSION: Left frontal lobe hematoma and surrounding vasogenic edema relatively similar to prior exam.  Change in appearance of intraventricular blood, less notable within  the left ventricle and now visualized within the right ventricle.  Subarachnoid blood now noted most prominent posterior sylvian fissure.  Please see above.  This is a call report.   Electronically Signed   By: Bridgett Larsson M.D.   On: 05/19/2013 11:36   Ct Head  Wo Contrast   (if New Onset Seizure And/or Head Trauma)  05/18/2013   CLINICAL DATA:  Unresponsive  EXAM: CT HEAD WITHOUT CONTRAST  TECHNIQUE: Contiguous axial images were obtained from the base of the skull through the vertex without intravenous contrast.  COMPARISON:  None.  FINDINGS: A left frontal intraparenchymal hematoma measuring 3.6 x 3.8 cm is seen (series 2, image 16). This hematoma involves the overlying cortical gray matter as well as the subcortical white matter, and likely the left caudate head. There is a small amount of localized edema. Hyperdense intraventricular hemorrhage is seen within the left lateral ventricle as well (series 2, image 18). No significant midline shift identified at this time. There is no hydrocephalus. No acute intracranial hemorrhage seen within the right cerebral hemisphere. There is no extra-axial fluid collection.  Mild atrophy with chronic microvascular ischemic changes are present. No mass lesion identified. The calvarium is intact.  Paranasal sinuses and mastoid air cells are clear.  Endotracheal tube is partially visualized.  IMPRESSION: 1. Left frontal lobe intraparenchymal hematoma with intraventricular hemorrhage within the left lateral ventricle as above. Hemorrhagic infarct with intraventricular extension is suspected. No midline shift or hydrocephalus identified. 2. Atrophy with chronic microvascular ischemic changes.  Critical Value/emergent results were called by telephone at the time of interpretation on 05/18/2013 at 11:03 PM to Dr.DANIELLE RAY , who verbally acknowledged these results.   Electronically Signed   By: Rise Mu M.D.   On: 05/18/2013 23:08   Dg Chest Portable 1  View  05/20/2013   CLINICAL DATA:  Intubation.  EXAM: PORTABLE CHEST - 1 VIEW  COMPARISON:  05/19/2013.  05/18/2013.  FINDINGS: Endotracheal tube in good anatomic position 3 cm above the carina. NG tube noted in good position. Cardiac pacer noted with lead tips in the right atrium and right ventricle. Previously identified right upper lobe infiltrate has largely cleared. Poor lung volumes with basilar atelectasis versus basilar pneumonia. No pleural effusion or pneumothorax. No acute osseous abnormality.  IMPRESSION: 1. Support lines in good anatomic position. 2. Interim clearing of right upper lobe pneumonia. Poor lung volumes with bibasilar atelectasis versus bibasilar pneumonia noted on today's exam.   Electronically Signed   By: Maisie Fus  Register   On: 05/20/2013 07:34   Dg Chest Port 1 View  05/19/2013   CLINICAL DATA:  Endotracheal tube evaluation.  EXAM: PORTABLE CHEST - 1 VIEW  COMPARISON:  05/18/2013.  FINDINGS: Endotracheal tube tip is a 2.2 cm above the carina.  Nasogastric tube courses below the diaphragm. Tip is not included on the present exam.  Sequential pacemaker enters from the left with leads unchanged.  Heart is slightly enlarged.  Decreased in degree although incomplete clearance of asymmetric airspace disease. This may represent resolving pulmonary edema. Infectious infiltrate not excluded in the proper clinical setting. Residual left base atelectasis/ infiltrate.  No gross pneumothorax.  Calcified aorta.  IMPRESSION: Decreased in degree although incomplete clearance of asymmetric airspace disease. This may represent resolving pulmonary edema. Infectious infiltrate not excluded in the proper clinical setting. Residual left base atelectasis/ infiltrate.  Endotracheal tube tip 2.2 cm above the carina.   Electronically Signed   By: Bridgett Larsson M.D.   On: 05/19/2013 08:04   Dg Chest Portable 1 View  05/18/2013   CLINICAL DATA:  Post intubation.  EXAM: PORTABLE CHEST - 1 VIEW  COMPARISON:   CT chest and chest radiograph 11/26/2011.  FINDINGS: Endotracheal tube terminates 3.1 cm above the carina. Nasogastric tube is followed into the  stomach. Left subclavian pacemaker lead tips project over the right atrium and right ventricle. Heart size normal. There is upper and midlung zone predominant airspace disease. No pleural fluid.  IMPRESSION: 1. Satisfactory endotracheal and nasogastric tube placements. 2. Bilateral airspace disease may be due to pneumonia. Noncardiogenic edema is another consideration.   Electronically Signed   By: Leanna Battles M.D.   On: 05/18/2013 21:25    Microbiology: Recent Results (from the past 240 hour(s))  CULTURE, RESPIRATORY (NON-EXPECTORATED)     Status: None   Collection Time    05/19/13 12:01 AM      Result Value Range Status   Specimen Description SPUTUM   Final   Special Requests NONE   Final   Gram Stain     Final   Value: NO WBC SEEN     NO SQUAMOUS EPITHELIAL CELLS SEEN     FEW GRAM NEGATIVE RODS     RARE GRAM POSITIVE COCCI     IN PAIRS RARE GRAM POSITIVE RODS   Culture     Final   Value: NORMAL OROPHARYNGEAL FLORA     Performed at [facility]   Report Status 05/21/2013 FINAL   Final  MRSA PCR SCREENING     Status: None   Collection Time    05/19/13  1:35 AM      Result Value Range Status   MRSA by PCR NEGATIVE  NEGATIVE Final   Comment:            The GeneXpert MRSA Assay (FDA     approved for NASAL specimens     only), is one component of a     comprehensive MRSA colonization     surveillance program. It is not     intended to diagnose MRSA     infection nor to guide or     monitor treatment for     MRSA infections.     Labs: Basic Metabolic Panel:  Recent Labs Lab 05/19/13 0230 05/20/13 0322 05/21/13 0740 05/22/13 0504 05/23/13 0500  NA 138 138 141 139 138  K 4.2 3.7 3.3* 4.1 4.3  CL 103 103 106 106 105  CO2 24 24 27 27 25   GLUCOSE 123* 117* 114* 107* 116*  BUN 21 14 10 11 11   CREATININE 1.09 0.79 0.64  0.68 0.64  CALCIUM 8.3* 8.4 8.2* 8.3* 8.4  MG  --   --  2.2 2.2  --   PHOS  --   --  2.1* 1.6*  --    Liver Function Tests: No results found for this basename: AST, ALT, ALKPHOS, BILITOT, PROT, ALBUMIN,  in the last 168 hours No results found for this basename: LIPASE, AMYLASE,  in the last 168 hours No results found for this basename: AMMONIA,  in the last 168 hours CBC:  Recent Labs Lab 05/19/13 0230 05/20/13 0322 05/21/13 0740 05/22/13 0504 05/23/13 0500  WBC 10.5 7.2 6.2 6.2 7.1  HGB 12.2 12.1 10.3* 10.5* 10.9*  HCT 38.0 36.8 31.9* 31.9* 33.7*  MCV 87.4 86.8 86.0 86.0 85.5  PLT 153 128* 124* 133* 178   Cardiac Enzymes: No results found for this basename: CKTOTAL, CKMB, CKMBINDEX, TROPONINI,  in the last 168 hours BNP: BNP (last 3 results) No results found for this basename: PROBNP,  in the last 8760 hours CBG:  Recent Labs Lab 05/22/13 0441 05/22/13 0804 05/22/13 1201 05/22/13 1612 05/22/13 2005  GLUCAP 110* 117* 110* 104* 117*    Additional labs: 1. Hemoglobin  A1c: 5.9 2. TSH: 6.144 3. 2-D echo 05/20/13: Study Conclusions  - Left ventricle: The cavity size was normal. There was mild focal basal hypertrophy of the septum. Systolic function was normal. The estimated ejection fraction was in the range of 55% to 60%. Wall motion was normal; there were no regional wall motion abnormalities. Doppler parameters are consistent with abnormal left ventricular relaxation (grade 1 diastolic dysfunction). - Mitral valve: Mild regurgitation. - Left atrium: The atrium was moderately dilated. - Pulmonary arteries: Systolic pressure was mildly increased. PA peak pressure: 41mm Hg (S). Impressions:  - Possible small, oscillating density (? thrombus; ?vegetation)associated with RA wire vs chiari network; clinical correlation recommended. Since 11/27/11, percardial and pleural effusions resolved. 4. Carotid Dopplers 05/20/13: Summary: Findings suggest 1-39% internal  carotid artery stenosis bilaterally. Vertebral arteries are patent with antegrade flow.    Signed:  Marcellus Scott, MD, FACP, FHM. [facility] Pager [phone]  If 7PM-7AM, please contact night-coverage [email or web address] Password [facility] 05/24/2013, 11:53 AM

## 2013-05-24 NOTE — PMR Pre-admission (Signed)
PMR Admission Coordinator Pre-Admission Assessment  Patient: Ann Kaufman is an 79 y.o., female MRN: [id number] DOB: February 01, 1930 Height: 5\' 4"  (162.6 cm) Weight: 78.291 kg (172 lb 9.6 oz)              Insurance Information HMO:  No    PPO:       PCP:       IPA:       80/20:       OTHER:   PRIMARY: Medicare A/B      Policy#: [id number]      Subscriber: Isaiah Serge CM Name:        Phone#:       Fax#:   Pre-Cert#:        Employer: Retired Benefits:  Phone #:       Name: Armed forces technical officer. Date: 05/23/97     Deduct: $1216      Out of Pocket Max: none      Life Max: unlimited CIR: 100%      SNF: 100 days Outpatient: 80%     Co-Pay: 20% Home Health: 100%      Co-Pay: none DME: 80%     Co-Pay: 20% Providers: patient's choice  SECONDARY: Tricare for life      Policy#: [id number]      Subscriber: Lelon Huh CM Name:        Phone#:       Fax#:   Pre-Cert#:        Employer: Retired Benefits:  Phone #: [phone]     Name:   Eff. Date:       Deduct:        Out of Pocket Max:        Life Max:   CIR:        SNF:   Outpatient:       Co-Pay:   Home Health:        Co-Pay:   DME:       Co-Pay:     Emergency Contact Information Contact Information   Name Relation Home Work Mobile   [place] Daughter [phone]  [phone]     Current Medical History  Patient Admitting Diagnosis: Left frontal hematoma with right hemiparesis, speech/?language issues   History of Present Illness:  An 79 y.o. female with history of HTN, A fib-on xarelto, PPM; who was admitted on 05/18/13 with speech difficulty, right facial droop and seizure. Treated with ativan and loaded with keppra. Patient intubated for airway protection and CT head with Left frontal lobe intraparenchymal hematoma with intraventricular hemorrhage within the left lateral ventricle. Patient in acute respiratory distress due to pulmonary edema v/s ALI/ARDS. Empiric antibiotics and BD ordered. Neurology consulted and anticoagulation reversed.  Follow up CCT 11/27 with left frontal hematoma and vasogenic edema. 2D echo with EF 55-60% and possible small, oscillating density (? Thrombus; ?vegetation)associated with RA wire vs chiari network. Extubated without difficulty and swallow evaluation without evidence of dysphagia. Therapies initiated and patient noted to have poor reasoning with poor awareness, high level cognitive deficits as well as visual impairments. MD, PT, OT, ST recommending CIR.   Past Medical History  Past Medical History  Diagnosis Date  . Intracerebral hemorrhage     a. 04/2013 in setting of xarelto therapy.  . Atrial fibrillation     a. Dx in 2012;  b. Rhytm controlled - amiodarone, previously anticoagulated with xarelto (d/c'd 04/2013 in setting of ICH);  b. 04/2013 Echo: EF 55-60%, Gr 1 DD, mild MR, mod  dil LA, PAsP .  . Hypertension   . Depression   . Arthritis   . Presence of permanent cardiac pacemaker     a. 2012 MDT, placed in [place] (probable tachy-brady).    Family History  family history includes Other in an other family member.  Prior Rehab/Hospitalizations:  Had shoulder rehab as an outpatient 10 yrs ago.   Current Medications  Current facility-administered medications:0.9 %  sodium chloride infusion, 250 mL, Intravenous, PRN, Merwyn Katos, MD, Last Rate: 10 mL/hr at 05/20/13 1829, 250 mL at 05/20/13 1829;  acetaminophen (TYLENOL) tablet 650 mg, 650 mg, Oral, Q6H PRN, Elease Etienne, MD, 650 mg at 05/23/13 1650;  albuterol (PROVENTIL) (5 MG/ML) 0.5% nebulizer solution 2.5 mg, 2.5 mg, Nebulization, Q3H PRN, Merwyn Katos, MD albuterol (PROVENTIL) (5 MG/ML) 0.5% nebulizer solution 2.5 mg, 2.5 mg, Nebulization, BID, Ripudeep K Rai, MD, 2.5 mg at 05/24/13 0757;  amiodarone (PACERONE) tablet 200 mg, 200 mg, Oral, Daily, Elease Etienne, MD, 200 mg at 05/24/13 1004;  ampicillin-sulbactam (UNASYN) 1.5 g in sodium chloride 0.9 % 50 mL IVPB, 1.5 g, Intravenous, Q8H, Merwyn Katos, MD, 1.5 g  at 05/24/13 1004 antiseptic oral rinse (BIOTENE) solution 15 mL, 15 mL, Mouth Rinse, q12n4p, Alyson Reedy, MD, 15 mL at 05/23/13 0929;  chlorhexidine (PERIDEX) 0.12 % solution 15 mL, 15 mL, Mouth Rinse, BID, Alyson Reedy, MD, 15 mL at 05/24/13 1004;  famotidine (PEPCID) tablet 20 mg, 20 mg, Oral, BID, Ripudeep K Rai, MD, 20 mg at 05/24/13 1003 fentaNYL (SUBLIMAZE) injection 25-50 mcg, 25-50 mcg, Intravenous, Q2H PRN, Merwyn Katos, MD, 50 mcg at 05/20/13 0615;  ipratropium (ATROVENT) nebulizer solution 0.5 mg, 0.5 mg, Nebulization, BID, Ripudeep K Rai, MD, 0.5 mg at 05/24/13 0756;  levETIRAcetam (KEPPRA) tablet 500 mg, 500 mg, Oral, BID, Ripudeep K Rai, MD, 500 mg at 05/24/13 1004 metoprolol (LOPRESSOR) injection 5 mg, 5 mg, Intravenous, Q6H PRN, Violet Baldy Reidler, PA-C, 5 mg at 05/23/13 1610;  metoprolol tartrate (LOPRESSOR) tablet 25 mg, 25 mg, Oral, BID, Elease Etienne, MD, 25 mg at 05/24/13 1004  Patients Current Diet: Cardiac  Precautions / Restrictions Precautions Precautions: Fall   Prior Activity Level Community (5-7x/wk): Went out 5 days a week.  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  Home Assistive Devices / Equipment Home Assistive Devices/Equipment: None  Prior Functional Level Prior Function Level of Independence: Independent Comments: volunteered with mentally handicapped adults  Current Functional Level Cognition  Arousal/Alertness: Awake/alert Overall Cognitive Status: Impaired/Different from baseline Difficult to assess due to: Impaired communication Current Attention Level: Sustained Orientation Level: Oriented X4 Safety/Judgement: Decreased awareness of deficits;Decreased awareness of safety General Comments: decreased slf monitoring Attention: Focused;Sustained Focused Attention: Appears intact Sustained Attention: Appears intact Memory: Impaired Memory Impairment: Storage deficit;Retrieval deficit;Decreased recall of new  information Awareness: Impaired Awareness Impairment: Emergent impairment;Intellectual impairment Problem Solving: Impaired Problem Solving Impairment: Verbal complex;Functional complex Executive Function: Sequencing;Organizing;Self Monitoring;Self Correcting;Reasoning Reasoning: Impaired Reasoning Impairment: Verbal complex;Functional complex;Functional basic Sequencing: Impaired Sequencing Impairment: Functional complex Organizing: Impaired Organizing Impairment: Functional complex Self Monitoring: Impaired Self Monitoring Impairment: Verbal complex;Functional basic Self Correcting: Impaired Self Correcting Impairment: Verbal complex;Functional basic Safety/Judgment: Impaired    Extremity Assessment (includes Sensation/Coordination)          ADLs  Eating/Feeding: Supervision/safety Where Assessed - Eating/Feeding: Chair Grooming: Min guard Where Assessed - Grooming: Unsupported standing Upper Body Bathing: Supervision/safety;Set up Where Assessed - Upper Body Bathing: Supported sitting Lower Body Bathing: Minimal assistance  Where Assessed - Lower Body Bathing: Supported sit to stand Upper Body Dressing: Minimal assistance Where Assessed - Upper Body Dressing: Unsupported sitting Lower Body Dressing: Moderate assistance Where Assessed - Lower Body Dressing: Supported sit to stand Toilet Transfer: Mining engineer Method:  (ambulating) Acupuncturist: Comfort height toilet Toileting - Clothing Manipulation and Hygiene: Supervision/safety Where Assessed - Engineer, mining and Hygiene: Sit to stand from 3-in-1 or toilet Equipment Used: Gait belt;Rolling walker Transfers/Ambulation Related to ADLs: Min A in room with RW. Patient c/o buttock/posterior hip pain with movement. Back to bed at end of session and RN notified. ADL Comments: affected by higher level cognitive deficits. Bumping iinto objects.    Mobility  Bed  Mobility: Supine to Sit Rolling Left: 5: Supervision Left Sidelying to Sit: 4: Min guard Sitting - Scoot to Edge of Bed: 4: Min guard    Transfers  Transfers: Sit to Stand;Stand to [employer] Transfers Sit to Stand: 4: Min assist;With upper extremity assist;With armrests;From chair/3-in-1 Stand to Sit: 4: Min assist;With upper extremity assist;With armrests;To chair/3-in-1 Stand Pivot Transfers: 3: Mod assist    Ambulation / Gait / Stairs / Psychologist, prison and probation services  Ambulation/Gait Ambulation/Gait Assistance: 4: Min Environmental consultant (Feet): 120 Feet Assistive device: Rolling walker Ambulation/Gait Assistance Details: Repeated verbal/tactile cues to steer walker back toward middle of hallway.  Pt repeatedly veering to the lt and with decr awareness of this. Gait Pattern: Step-through pattern;Decreased step length - right Gait velocity: decr    Posture / Balance Static Sitting Balance Static Sitting - Balance Support: No upper extremity supported;Feet supported Static Sitting - Level of Assistance: 5: Stand by assistance Static Sitting - Comment/# of Minutes: 4 Static Standing Balance Static Standing - Balance Support: During functional activity Static Standing - Level of Assistance: 5: Stand by assistance Static Standing - Comment/# of Minutes: 30 seconds x 2; dizziness    Special needs/care consideration BiPAP/CPAP No CPM No Continuous Drip IV No Dialysis No         Life Vest No Oxygen No Special Bed No Trach Size No Wound Vac (area) No      Skin Has bruises on arms bilaterally Bowel mgmt: Had BM 05/24/13 Bladder mgmt: Voiding in bathroom Diabetic mgmt No    Previous Home Environment Living Arrangements: Alone  Lives With: Alone Type of Home: Apartment Home Care Services: No  Discharge Living Setting Plans for Discharge Living Setting: Alone;Apartment (Has daughter in [place].) Type of Home at Discharge: Apartment (Lives in a condominium) Discharge  Home Layout: One level Discharge Home Access: Stairs to enter Entrance Stairs-Number of Steps: Small 1/2 step entry Does the patient have any problems obtaining your medications?: No  Social/Family/Support Systems Contact Information: Jani Gravel - daughter [phone] Anticipated Caregiver: self Caregiver Availability: Intermittent Discharge Plan Discussed with Primary Caregiver: Yes Is Caregiver In Agreement with Plan?: Yes Does Caregiver/Family have Issues with Lodging/Transportation while Pt is in Rehab?: No  Goals/Additional Needs Patient/Family Goal for Rehab: PT/OT/ST mod I goals Expected length of stay: 7 days Cultural Considerations: None Dietary Needs: Heart, thin liquids Equipment Needs: TBD Pt/Family Agrees to Admission and willing to participate: Yes Program Orientation Provided & Reviewed with Pt/Caregiver Including Roles  & Responsibilities: Yes   Decrease burden of Care through IP rehab admission: N/A  Possible need for SNF placement upon discharge: Not planned   Patient Condition: This patient's medical and functional status has changed since the consult dated: 05/23/13 in which the Rehabilitation Physician determined and documented  that the patient's condition is appropriate for intensive rehabilitative care in an inpatient rehabilitation facility. See "History of Present Illness" (above) for medical update. Functional changes are: Currently ambulating with min assist 120' RW. Patient's medical and functional status update has been discussed with the Rehabilitation physician and patient remains appropriate for inpatient rehabilitation. Will admit to inpatient rehab today.  Preadmission Screen Completed By:  Trish Mage, 05/24/2013 10:32 AM ______________________________________________________________________   Discussed status with Dr. Riley Kill on 05/24/13 at 1032 and received telephone approval for admission today.  Admission Coordinator:  Trish Mage, time1032/Date12/02/14

## 2013-05-24 NOTE — Progress Notes (Signed)
Received pt. As a transfer from [facility].Pt. Alert and oriented,from home alone.No equipment at the bedside.Pt. Skin is intact,no pressure sore at the moment.Pt. Has some rash at upper back,from psoriases as per pt.Also bilateral arms with bruises.Keep monitoring pt. And assessing her needs.

## 2013-05-24 NOTE — Progress Notes (Signed)
   SUBJECTIVE:  No acute cardiac complaints   PHYSICAL EXAM Filed Vitals:   05/23/13 2354 05/24/13 0410 05/24/13 0758 05/24/13 0823  BP: 100/61 130/73  123/49  Pulse: 68 69  74  Temp: 97.3 F (36.3 C) 98.7 F (37.1 C)  99.2 F (37.3 C)  TempSrc: Oral Oral    Resp: 20 18  16   Height:      Weight:  172 lb 9.6 oz (78.291 kg)    SpO2: 90% 91% 88% 94%    LABS: Lab Results  Component Value Date   TROPONINI <0.30 11/27/2011   No results found for this or any previous visit (from the past 24 hour(s)).  Intake/Output Summary (Last 24 hours) at 05/24/13 1610 Last data filed at 05/24/13 0700  Gross per 24 hour  Intake    820 ml  Output      0 ml  Net    820 ml    ASSESSMENT AND PLAN:  ATRIAL FIB:  No recurrent arrhythmia.  Telemetry reviewed. OK to transfer to rehab without telemetry.  No change in therapy.  She will follow up with her primary cardiologist in [place].   Discussed with patient.   Discussed with Dr. Waymon Amato.   Rollene Rotunda 05/24/2013 9:39 AM

## 2013-05-24 NOTE — Progress Notes (Signed)
Nurse called as patient has been agitated and hard to redirect since admission--refusing meds. Order for xanax given to help calm her down.  Daughter concerned as patient wants to go home. Talked with daughter and relayed that patient not in proper state of mind to may appropriate decision at this time and leaving would be against medical advice. Patient likely disoriented from change of floor and that this has been exacerbated by bring in pain all day. Daughter willing and encouraged to spend the night to help redirect patient.  She will encourage patient to take xanax as well as pain medication which should help calm her down. Will re-evaluate situation in morning.

## 2013-05-24 NOTE — H&P (Signed)
Physical Medicine and Rehabilitation Admission H&P    Chief Complaint  Patient presents with  . Speech difficulty, right facial weakness and seizure due to ICH    HPI: Ann Kaufman is a 79 y.o. female with history of HTN, A fib-on xarelto, PPM; who was admitted on 05/18/13 with speech difficulty, right facial droop and seizure. Treated with ativan and loaded with keppra. Patient intubated for airway protection and CT head with Left frontal lobe intraparenchymal hematoma with intraventricular hemorrhage within the left lateral ventricle. Patient in acute respiratory distress due to pulmonary edema v/s ALI/ARDS. Empiric antibiotics and BD ordered. Neurology consulted and anticoagulation reversed per [facility]IBA protocol. Follow up CCT 11/27 with left frontal hematoma and vasogenic edema. 2D echo with EF 55-60% and possible small, oscillating density (? Thrombus; ?vegetation)associated with RA wire vs chiari network. Extubated without difficulty and swallow evaluation without evidence of dysphagia. Therapies initiated and patient with subsequent high level cognitive deficits with decreased safety as well as left visual field impairments. Therapy team recommended CIR and patient admitted today.   Review of Systems  Eyes: Positive for double vision.       Visual changes--'spots in right eye"  since stroke.   Respiratory: Positive for shortness of breath (chronic DOE). Negative for cough.   Cardiovascular: Negative for chest pain and palpitations.  Gastrointestinal: Negative for heartburn and nausea.  Genitourinary: Negative for urgency and frequency.  Musculoskeletal: Positive for back pain (gets massage 2/month), joint pain (right RTC problems) and myalgias.  Neurological: Positive for headaches.  Psychiatric/Behavioral: The patient has insomnia (bad night due to back pain).     Past Medical History  Diagnosis Date  . Intracerebral hemorrhage     a. 04/2013 in setting of xarelto therapy.  . Atrial  fibrillation     a. Dx in 2012;  b. Rhytm controlled - amiodarone, previously anticoagulated with xarelto (d/c'd 04/2013 in setting of ICH);  b. 04/2013 Echo: EF 55-60%, Gr 1 DD, mild MR, mod dil LA, PAsP .  . Hypertension   . Depression   . Arthritis   . Presence of permanent cardiac pacemaker     a. 2012 MDT, placed in [place] (probable tachy-brady).   Past Surgical History  Procedure Laterality Date  . Pacemaker insertion    . Abdominal hysterectomy    . Breast lumpectomy    . Appendectomy    . Bowel resection     Family History  Problem Relation Age of Onset  . Other      negative for premature CAD.   Social History: Lives alone. Independent PTA--volunteers 2 X week. Loves to travel-- got back from [place] couple of weeks ago. she reports that she quit smoking about 44 years ago. She does not have any smokeless tobacco history on file. She reports that she does not use illicit drugs. Has a glass of wine occasionally.  Allergies: No Known Allergies  Medications Prior to Admission  Medication Sig Dispense Refill  . Acetaminophen (TYLENOL PO) Take 1 tablet by mouth every 6 (six) hours as needed (for pain).      Marland Kitchen amiodarone (PACERONE) 200 MG tablet Take 200 mg by mouth every morning.      . folic acid (FOLVITE) 400 MCG tablet Take 400 mcg by mouth every evening.      . furosemide (LASIX) 40 MG tablet Take 40 mg by mouth every morning.      . metoprolol tartrate (LOPRESSOR) 25 MG tablet Take 25 mg by mouth 2 (two)  times daily.      . Nutritional Supplements (JUICE PLUS FIBRE PO) Take 1 tablet by mouth daily.      Marland Kitchen omeprazole (PRILOSEC) 20 MG capsule Take 20 mg by mouth every morning.      . potassium chloride SA (K-DUR,KLOR-CON) 20 MEQ tablet Take 20 mEq by mouth every morning.      . Rivaroxaban (XARELTO) 20 MG TABS tablet Take 20 mg by mouth every evening.      . senna (SENOKOT) 8.6 MG TABS tablet Take 1 tablet by mouth daily as needed for mild constipation or moderate  constipation.      . sertraline (ZOLOFT) 50 MG tablet Take 50 mg by mouth at bedtime.      . simvastatin (ZOCOR) 10 MG tablet Take 10 mg by mouth every evening.      . vitamin B-12 (CYANOCOBALAMIN) 1000 MCG tablet Take 1,000 mcg by mouth every evening.      . Vitamin D, Ergocalciferol, (DRISDOL) 50000 UNITS CAPS capsule Take 50,000 Units by mouth every 14 (fourteen) days.        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  Functional Status:  Mobility: Bed Mobility Bed Mobility: Supine to Sit Rolling Left: 5: Supervision Left Sidelying to Sit: 4: Min guard Sitting - Scoot to [place] of Bed: 4: Min guard Transfers Transfers: Sit to Stand;Stand to [employer] Transfers Sit to Stand: 4: Min assist;With upper extremity assist;With armrests;From chair/3-in-1 Stand to Sit: 4: Min assist;With upper extremity assist;With armrests;To chair/3-in-1 Stand Pivot Transfers: 3: Mod assist Ambulation/Gait Ambulation/Gait Assistance: 4: Min assist Ambulation Distance (Feet): 120 Feet Assistive device: Rolling walker Ambulation/Gait Assistance Details: Repeated verbal/tactile cues to steer walker back toward middle of hallway.  Pt repeatedly veering to the lt and with decr awareness of this. Gait Pattern: Step-through pattern;Decreased step length - right Gait velocity: decr    ADL: ADL Eating/Feeding: Supervision/safety Where Assessed - Eating/Feeding: Chair Grooming: Min guard Where Assessed - Grooming: Unsupported standing Upper Body Bathing: Supervision/safety;Set up Where Assessed - Upper Body Bathing: Supported sitting Lower Body Bathing: Minimal assistance Where Assessed - Lower Body Bathing: Supported sit to stand Upper Body Dressing: Minimal  assistance Where Assessed - Upper Body Dressing: Unsupported sitting Lower Body Dressing: Moderate assistance Where Assessed - Lower Body Dressing: Supported sit to stand Toilet Transfer: Mining engineer Method:  (ambulating) Acupuncturist: Comfort height toilet Equipment Used: Gait belt;Rolling walker Transfers/Ambulation Related to ADLs: Min A in room with RW. Patient c/o buttock/posterior hip pain with movement. Back to bed at end of session and RN notified. ADL Comments: affected by higher level cognitive deficits. Bumping iinto objects.  Cognition: Cognition Overall Cognitive Status: Impaired/Different from baseline Arousal/Alertness: Awake/alert Orientation Level: Oriented X4 Attention: Focused;Sustained Focused Attention: Appears intact Sustained Attention: Appears intact Memory: Impaired Memory Impairment: Storage deficit;Retrieval deficit;Decreased recall of new information Awareness: Impaired Awareness Impairment: Emergent impairment;Intellectual impairment Problem Solving: Impaired Problem Solving Impairment: Verbal complex;Functional complex Executive Function: Sequencing;Organizing;Self Monitoring;Self Correcting;Reasoning Reasoning: Impaired Reasoning Impairment: Verbal complex;Functional complex;Functional basic Sequencing: Impaired Sequencing Impairment: Functional complex Organizing: Impaired Organizing Impairment: Functional complex Self Monitoring: Impaired Self Monitoring Impairment: Verbal complex;Functional basic Self Correcting: Impaired Self Correcting Impairment: Verbal complex;Functional basic Safety/Judgment: Impaired Cognition Arousal/Alertness: Awake/alert Behavior During Therapy: WFL for tasks assessed/performed Overall Cognitive Status: Impaired/Different from baseline Area of  Impairment: Orientation;Attention;Memory;Safety/judgement;Awareness;Problem solving Orientation Level: Disoriented  to;Place;Time;Situation Current Attention Level: Sustained Memory: Decreased short-term memory;Decreased recall of precautions Safety/Judgement: Decreased awareness of deficits;Decreased awareness of safety Awareness: Emergent Problem Solving: Requires verbal cues General Comments: decreased slf monitoring Difficult to assess due to: Impaired communication  Physical Exam: Blood pressure 137/67, pulse 74, temperature 99.2 F (37.3 C), temperature source Oral, resp. rate 16, height 5\' 4"  (1.626 m), weight 78.291 kg (172 lb 9.6 oz), SpO2 94.00%. Physical Exam  Nursing note and vitals reviewed. Constitutional: She is oriented to person, place, and time. She appears well-developed and well-nourished.  HENT:  Head: Normocephalic and atraumatic.  Eyes: Conjunctivae and EOM are normal. Pupils are equal, round, and reactive to light. Right eye exhibits no discharge. Left eye exhibits no discharge. No scleral icterus.  Neck: Normal range of motion. Neck supple.  Cardiovascular: Normal rate and regular rhythm.  Exam reveals no gallop and no friction rub.   No murmur heard. Respiratory: Effort normal. No respiratory distress. She has no wheezes.  GI: Soft. Bowel sounds are normal. She exhibits no distension. There is no tenderness.  Musculoskeletal: She exhibits no edema and no tenderness.  Neurological: She is alert and oriented to person, place, and time.  Right facial weakness. Speech with mild dysarthria which has improved somewht from yesterday .Follows two step commands without difficulty. Mild right sided weakness, grossly 4/5 RUE prox to distal. RLE is 4-HF to 4/5 at ankle. Decreased FTN with right hand. No gross sensory changes.  Occasional  word finding issues. Has reasonable insight and awareness. Remembered seeing me yesterday  Skin: Skin is warm and dry.  A few limb bruises noted  Psychiatric: She has a normal mood and affect. Her behavior is normal. Judgment and thought content normal.     Results for orders placed during the hospital encounter of 05/18/13 (from the past 48 hour(s))  GLUCOSE, CAPILLARY     Status: Abnormal   Collection Time    05/22/13 12:01 PM      Result Value Range   Glucose-Capillary 110 (*) 70 - 99 mg/dL   Comment 1 Documented in Chart     Comment 2 Notify RN    GLUCOSE, CAPILLARY     Status: Abnormal   Collection Time    05/22/13  4:12 PM      Result Value Range   Glucose-Capillary 104 (*) 70 - 99 mg/dL   Comment 1 Notify RN     Comment 2 Documented in Chart    GLUCOSE, CAPILLARY     Status: Abnormal   Collection Time    05/22/13  8:05 PM      Result Value Range   Glucose-Capillary 117 (*) 70 - 99 mg/dL  CBC     Status: Abnormal   Collection Time    05/23/13  5:00 AM      Result Value Range   WBC 7.1  4.0 - 10.5 K/uL   RBC 3.94  3.87 - 5.11 MIL/uL   Hemoglobin 10.9 (*) 12.0 - 15.0 g/dL   HCT 40.9 (*) 81.1 - 91.4 %   MCV 85.5  78.0 - 100.0 fL   MCH 27.7  26.0 - 34.0 pg   MCHC 32.3  30.0 - 36.0 g/dL   RDW 78.2  95.6 - 21.3 %   Platelets 178  150 - 400 K/uL  BASIC METABOLIC PANEL     Status: Abnormal   Collection Time    05/23/13  5:00 AM  Result Value Range   Sodium 138  135 - 145 mEq/L   Potassium 4.3  3.5 - 5.1 mEq/L   Comment: HEMOLYSIS AT THIS LEVEL MAY AFFECT RESULT   Chloride 105  96 - 112 mEq/L   CO2 25  19 - 32 mEq/L   Glucose, Bld 116 (*) 70 - 99 mg/dL   BUN 11  6 - 23 mg/dL   Creatinine, Ser 1.61  0.50 - 1.10 mg/dL   Calcium 8.4  8.4 - 09.6 mg/dL   GFR calc non Af Amer 82 (*) >90 mL/min   GFR calc Af Amer >90  >90 mL/min   Comment: (NOTE)     The eGFR has been calculated using the CKD EPI equation.     This calculation has not been validated in all clinical situations.     eGFR's persistently <90 mL/min signify possible Chronic Kidney     Disease.   No results found.  Post Admission Physician Evaluation: 1. Functional deficits secondary  to left frontal intraparenchymal hemorrhage. 2. Patient is admitted  to receive collaborative, interdisciplinary care between the physiatrist, rehab nursing staff, and therapy team. 3. Patient's level of medical complexity and substantial therapy needs in context of that medical necessity cannot be provided at a lesser intensity of care such as a SNF. 4. Patient has experienced substantial functional loss from his/her baseline which was documented above under the "Functional History" and "Functional Status" headings.  Judging by the patient's diagnosis, physical exam, and functional history, the patient has potential for functional progress which will result in measurable gains while on inpatient rehab.  These gains will be of substantial and practical use upon discharge  in facilitating mobility and self-care at the household level. 5. Physiatrist will provide 24 hour management of medical needs as well as oversight of the therapy plan/treatment and provide guidance as appropriate regarding the interaction of the two. 6. 24 hour rehab nursing will assist with bladder management, bowel management, safety, skin/wound care, disease management, medication administration, pain management and patient education  and help integrate therapy concepts, techniques,education, etc. 7. PT will assess and treat for/with: Lower extremity strength, range of motion, stamina, balance, functional mobility, safety, adaptive techniques and equipment, NMR, visual-perceptual rx.   Goals are: mod i. 8. OT will assess and treat for/with: ADL's, functional mobility, safety, upper extremity strength, adaptive techniques and equipment, NMR, visual perceptual rx.   Goals are: mod I. 9. SLP will assess and treat for/with: speech, communication.  Goals are: mod I. 10. Case Management and Social Worker will assess and treat for psychological issues and discharge planning. 11. Team conference will be held weekly to assess progress toward goals and to determine barriers to discharge. 12. Patient will receive  at least 3 hours of therapy per day at least 5 days per week. 13. ELOS: 7 days       14. Prognosis:  excellent   Medical Problem List and Plan: ICH with seizure activity--repeat CCT in one week to decide on ASA resumption.  1. DVT Prophylaxis/Anticoagulation: Pharmaceutical: Lovenox 2. Pain Management: Continue tylenol prn for headaches. Add muscle relaxers for exacerbation of chronic back pain 3. H/o depression/Mood: No signs of distress noted. Will have LCSW follow for evaluation and support in light of lifestyle changes due to this event. Pt seems genuinely invested in rehab and wants to get stronger. 4. Neuropsych: This patient is capable of making decisions on her own behalf. 5. A fib with PPM: Continue amiodarone and metoprolol. No  anticoagulation at this time and f/u with cards in [place].  7. New onset seizure: on keppra bid. 8. Aspiration PNA:  On antibiotic  D#7/7   Ranelle Oyster, MD, [facility] Physical Medicine & Rehabilitation  05/24/2013

## 2013-05-24 NOTE — Progress Notes (Signed)
Rehab admissions - Evaluated for possible admission.  I spoke with patient and she would like inpatient rehab in hopes of going home alone to her condominium.  Bed available and can admit to acute inpatient rehab today if okay with attending MD.  Call me for questions.  #[phone]

## 2013-05-24 NOTE — H&P (View-Only) (Signed)
Physical Medicine and Rehabilitation Admission H&P    Chief Complaint  Patient presents with  . Speech difficulty, right facial weakness and seizure due to ICH    HPI: Ann Kaufman is a 79 y.o. female with history of HTN, A fib-on xarelto, PPM; who was admitted on 05/18/13 with speech difficulty, right facial droop and seizure. Treated with ativan and loaded with keppra. Patient intubated for airway protection and CT head with Left frontal lobe intraparenchymal hematoma with intraventricular hemorrhage within the left lateral ventricle. Patient in acute respiratory distress due to pulmonary edema v/s ALI/ARDS. Empiric antibiotics and BD ordered. Neurology consulted and anticoagulation reversed per FEIBA protocol. Follow up CCT 11/27 with left frontal hematoma and vasogenic edema. 2D echo with EF 55-60% and possible small, oscillating density (? Thrombus; ?vegetation)associated with RA wire vs chiari network. Extubated without difficulty and swallow evaluation without evidence of dysphagia. Therapies initiated and patient with subsequent high level cognitive deficits with decreased safety as well as left visual field impairments. Therapy team recommended CIR and patient admitted today.   Review of Systems  Eyes: Positive for double vision.       Visual changes--'spots in right eye"  since stroke.   Respiratory: Positive for shortness of breath (chronic DOE). Negative for cough.   Cardiovascular: Negative for chest pain and palpitations.  Gastrointestinal: Negative for heartburn and nausea.  Genitourinary: Negative for urgency and frequency.  Musculoskeletal: Positive for back pain (gets massage 2/month), joint pain (right RTC problems) and myalgias.  Neurological: Positive for headaches.  Psychiatric/Behavioral: The patient has insomnia (bad night due to back pain).     Past Medical History  Diagnosis Date  . Intracerebral hemorrhage     a. 04/2013 in setting of xarelto therapy.  . Atrial  fibrillation     a. Dx in 2012;  b. Rhytm controlled - amiodarone, previously anticoagulated with xarelto (d/c'd 04/2013 in setting of ICH);  b. 04/2013 Echo: EF 55-60%, Gr 1 DD, mild MR, mod dil LA, PAsP 41mmhg.  . Hypertension   . Depression   . Arthritis   . Presence of permanent cardiac pacemaker     a. 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   Past Surgical History  Procedure Laterality Date  . Pacemaker insertion    . Abdominal hysterectomy    . Breast lumpectomy    . Appendectomy    . Bowel resection     Family History  Problem Relation Age of Onset  . Other      negative for premature CAD.   Social History: Lives alone. Independent PTA--volunteers 2 X week. Loves to travel-- got back from [place] couple of weeks ago. she reports that she quit smoking about 44 years ago. She does not have any smokeless tobacco history on file. She reports that she does not use illicit drugs. Has a glass of wine occasionally.  Allergies: No Known Allergies  Medications Prior to Admission  Medication Sig Dispense Refill  . Acetaminophen (TYLENOL PO) Take 1 tablet by mouth every 6 (six) hours as needed (for pain).      . amiodarone (PACERONE) 200 MG tablet Take 200 mg by mouth every morning.      . folic acid (FOLVITE) 400 MCG tablet Take 400 mcg by mouth every evening.      . furosemide (LASIX) 40 MG tablet Take 40 mg by mouth every morning.      . metoprolol tartrate (LOPRESSOR) 25 MG tablet Take 25 mg by mouth 2 (two)   times daily.      . Nutritional Supplements (JUICE PLUS FIBRE PO) Take 1 tablet by mouth daily.      . omeprazole (PRILOSEC) 20 MG capsule Take 20 mg by mouth every morning.      . potassium chloride SA (K-DUR,KLOR-CON) 20 MEQ tablet Take 20 mEq by mouth every morning.      . Rivaroxaban (XARELTO) 20 MG TABS tablet Take 20 mg by mouth every evening.      . senna (SENOKOT) 8.6 MG TABS tablet Take 1 tablet by mouth daily as needed for mild constipation or moderate  constipation.      . sertraline (ZOLOFT) 50 MG tablet Take 50 mg by mouth at bedtime.      . simvastatin (ZOCOR) 10 MG tablet Take 10 mg by mouth every evening.      . vitamin B-12 (CYANOCOBALAMIN) 1000 MCG tablet Take 1,000 mcg by mouth every evening.      . Vitamin D, Ergocalciferol, (DRISDOL) 50000 UNITS CAPS capsule Take 50,000 Units by mouth every 14 (fourteen) days.        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  Functional Status:  Mobility: Bed Mobility Bed Mobility: Supine to Sit Rolling Left: 5: Supervision Left Sidelying to Sit: 4: Min guard Sitting - Scoot to Edge of Bed: 4: Min guard Transfers Transfers: Sit to Stand;Stand to Sit;Stand Pivot Transfers Sit to Stand: 4: Min assist;With upper extremity assist;With armrests;From chair/3-in-1 Stand to Sit: 4: Min assist;With upper extremity assist;With armrests;To chair/3-in-1 Stand Pivot Transfers: 3: Mod assist Ambulation/Gait Ambulation/Gait Assistance: 4: Min assist Ambulation Distance (Feet): 120 Feet Assistive device: Rolling walker Ambulation/Gait Assistance Details: Repeated verbal/tactile cues to steer walker back toward middle of hallway.  Pt repeatedly veering to the lt and with decr awareness of this. Gait Pattern: Step-through pattern;Decreased step length - right Gait velocity: decr    ADL: ADL Eating/Feeding: Supervision/safety Where Assessed - Eating/Feeding: Chair Grooming: Min guard Where Assessed - Grooming: Unsupported standing Upper Body Bathing: Supervision/safety;Set up Where Assessed - Upper Body Bathing: Supported sitting Lower Body Bathing: Minimal assistance Where Assessed - Lower Body Bathing: Supported sit to stand Upper Body Dressing: Minimal  assistance Where Assessed - Upper Body Dressing: Unsupported sitting Lower Body Dressing: Moderate assistance Where Assessed - Lower Body Dressing: Supported sit to stand Toilet Transfer: Simulated;Minimal assistance Toilet Transfer Method:  (ambulating) Toilet Transfer Equipment: Comfort height toilet Equipment Used: Gait belt;Rolling walker Transfers/Ambulation Related to ADLs: Min A in room with RW. Patient c/o buttock/posterior hip pain with movement. Back to bed at end of session and RN notified. ADL Comments: affected by higher level cognitive deficits. Bumping iinto objects.  Cognition: Cognition Overall Cognitive Status: Impaired/Different from baseline Arousal/Alertness: Awake/alert Orientation Level: Oriented X4 Attention: Focused;Sustained Focused Attention: Appears intact Sustained Attention: Appears intact Memory: Impaired Memory Impairment: Storage deficit;Retrieval deficit;Decreased recall of new information Awareness: Impaired Awareness Impairment: Emergent impairment;Intellectual impairment Problem Solving: Impaired Problem Solving Impairment: Verbal complex;Functional complex Executive Function: Sequencing;Organizing;Self Monitoring;Self Correcting;Reasoning Reasoning: Impaired Reasoning Impairment: Verbal complex;Functional complex;Functional basic Sequencing: Impaired Sequencing Impairment: Functional complex Organizing: Impaired Organizing Impairment: Functional complex Self Monitoring: Impaired Self Monitoring Impairment: Verbal complex;Functional basic Self Correcting: Impaired Self Correcting Impairment: Verbal complex;Functional basic Safety/Judgment: Impaired Cognition Arousal/Alertness: Awake/alert Behavior During Therapy: WFL for tasks assessed/performed Overall Cognitive Status: Impaired/Different from baseline Area of   Impairment: Orientation;Attention;Memory;Safety/judgement;Awareness;Problem solving Orientation Level: Disoriented  to;Place;Time;Situation Current Attention Level: Sustained Memory: Decreased short-term memory;Decreased recall of precautions Safety/Judgement: Decreased awareness of deficits;Decreased awareness of safety Awareness: Emergent Problem Solving: Requires verbal cues General Comments: decreased slf monitoring Difficult to assess due to: Impaired communication  Physical Exam: Blood pressure 137/67, pulse 74, temperature 99.2 F (37.3 C), temperature source Oral, resp. rate 16, height 5' 4" (1.626 m), weight 78.291 kg (172 lb 9.6 oz), SpO2 94.00%. Physical Exam  Nursing note and vitals reviewed. Constitutional: She is oriented to person, place, and time. She appears well-developed and well-nourished.  HENT:  Head: Normocephalic and atraumatic.  Eyes: Conjunctivae and EOM are normal. Pupils are equal, round, and reactive to light. Right eye exhibits no discharge. Left eye exhibits no discharge. No scleral icterus.  Neck: Normal range of motion. Neck supple.  Cardiovascular: Normal rate and regular rhythm.  Exam reveals no gallop and no friction rub.   No murmur heard. Respiratory: Effort normal. No respiratory distress. She has no wheezes.  GI: Soft. Bowel sounds are normal. She exhibits no distension. There is no tenderness.  Musculoskeletal: She exhibits no edema and no tenderness.  Neurological: She is alert and oriented to person, place, and time.  Right facial weakness. Speech with mild dysarthria which has improved somewht from yesterday .Follows two step commands without difficulty. Mild right sided weakness, grossly 4/5 RUE prox to distal. RLE is 4-HF to 4/5 at ankle. Decreased FTN with right hand. No gross sensory changes.  Occasional  word finding issues. Has reasonable insight and awareness. Remembered seeing me yesterday  Skin: Skin is warm and dry.  A few limb bruises noted  Psychiatric: She has a normal mood and affect. Her behavior is normal. Judgment and thought content normal.     Results for orders placed during the hospital encounter of 05/18/13 (from the past 48 hour(s))  GLUCOSE, CAPILLARY     Status: Abnormal   Collection Time    05/22/13 12:01 PM      Result Value Range   Glucose-Capillary 110 (*) 70 - 99 mg/dL   Comment 1 Documented in Chart     Comment 2 Notify RN    GLUCOSE, CAPILLARY     Status: Abnormal   Collection Time    05/22/13  4:12 PM      Result Value Range   Glucose-Capillary 104 (*) 70 - 99 mg/dL   Comment 1 Notify RN     Comment 2 Documented in Chart    GLUCOSE, CAPILLARY     Status: Abnormal   Collection Time    05/22/13  8:05 PM      Result Value Range   Glucose-Capillary 117 (*) 70 - 99 mg/dL  CBC     Status: Abnormal   Collection Time    05/23/13  5:00 AM      Result Value Range   WBC 7.1  4.0 - 10.5 K/uL   RBC 3.94  3.87 - 5.11 MIL/uL   Hemoglobin 10.9 (*) 12.0 - 15.0 g/dL   HCT 33.7 (*) 36.0 - 46.0 %   MCV 85.5  78.0 - 100.0 fL   MCH 27.7  26.0 - 34.0 pg   MCHC 32.3  30.0 - 36.0 g/dL   RDW 15.2  11.5 - 15.5 %   Platelets 178  150 - 400 K/uL  BASIC METABOLIC PANEL     Status: Abnormal   Collection Time    05/23/13  5:00 AM        Result Value Range   Sodium 138  135 - 145 mEq/L   Potassium 4.3  3.5 - 5.1 mEq/L   Comment: HEMOLYSIS AT THIS LEVEL MAY AFFECT RESULT   Chloride 105  96 - 112 mEq/L   CO2 25  19 - 32 mEq/L   Glucose, Bld 116 (*) 70 - 99 mg/dL   BUN 11  6 - 23 mg/dL   Creatinine, Ser 0.64  0.50 - 1.10 mg/dL   Calcium 8.4  8.4 - 10.5 mg/dL   GFR calc non Af Amer 82 (*) >90 mL/min   GFR calc Af Amer >90  >90 mL/min   Comment: (NOTE)     The eGFR has been calculated using the CKD EPI equation.     This calculation has not been validated in all clinical situations.     eGFR's persistently <90 mL/min signify possible Chronic Kidney     Disease.   No results found.  Post Admission Physician Evaluation: 1. Functional deficits secondary  to left frontal intraparenchymal hemorrhage. 2. Patient is admitted  to receive collaborative, interdisciplinary care between the physiatrist, rehab nursing staff, and therapy team. 3. Patient's level of medical complexity and substantial therapy needs in context of that medical necessity cannot be provided at a lesser intensity of care such as a SNF. 4. Patient has experienced substantial functional loss from his/her baseline which was documented above under the "Functional History" and "Functional Status" headings.  Judging by the patient's diagnosis, physical exam, and functional history, the patient has potential for functional progress which will result in measurable gains while on inpatient rehab.  These gains will be of substantial and practical use upon discharge  in facilitating mobility and self-care at the household level. 5. Physiatrist will provide 24 hour management of medical needs as well as oversight of the therapy plan/treatment and provide guidance as appropriate regarding the interaction of the two. 6. 24 hour rehab nursing will assist with bladder management, bowel management, safety, skin/wound care, disease management, medication administration, pain management and patient education  and help integrate therapy concepts, techniques,education, etc. 7. PT will assess and treat for/with: Lower extremity strength, range of motion, stamina, balance, functional mobility, safety, adaptive techniques and equipment, NMR, visual-perceptual rx.   Goals are: mod i. 8. OT will assess and treat for/with: ADL's, functional mobility, safety, upper extremity strength, adaptive techniques and equipment, NMR, visual perceptual rx.   Goals are: mod I. 9. SLP will assess and treat for/with: speech, communication.  Goals are: mod I. 10. Case Management and Social Worker will assess and treat for psychological issues and discharge planning. 11. Team conference will be held weekly to assess progress toward goals and to determine barriers to discharge. 12. Patient will receive  at least 3 hours of therapy per day at least 5 days per week. 13. ELOS: 7 days       14. Prognosis:  excellent   Medical Problem List and Plan: ICH with seizure activity--repeat CCT in one week to decide on ASA resumption.  1. DVT Prophylaxis/Anticoagulation: Pharmaceutical: Lovenox 2. Pain Management: Continue tylenol prn for headaches. Add muscle relaxers for exacerbation of chronic back pain 3. H/o depression/Mood: No signs of distress noted. Will have LCSW follow for evaluation and support in light of lifestyle changes due to this event. Pt seems genuinely invested in rehab and wants to get stronger. 4. Neuropsych: This patient is capable of making decisions on her own behalf. 5. A fib with PPM: Continue amiodarone and metoprolol. No   anticoagulation at this time and f/u with cards in [place].  7. New onset seizure: on keppra bid. 8. Aspiration PNA:  On antibiotic  D#7/7   Youcef Klas T. Izabel Chim, MD, FAAPMR [facility]  05/24/2013 

## 2013-05-24 NOTE — Interval H&P Note (Signed)
Ann Kaufman was admitted today to Inpatient Rehabilitation with the diagnosis of left frontal hematoma.  The patient's history has been reviewed, patient examined, and there is no change in status.  Patient continues to be appropriate for intensive inpatient rehabilitation.  I have reviewed the patient's chart and labs.  Questions were answered to the patient's satisfaction.  SWARTZ,ZACHARY T 05/24/2013, 8:43 PM

## 2013-05-24 NOTE — Progress Notes (Signed)
Speech Language Pathology Treatment: Cognitive-Linquistic  Patient Details Name: Ann Kaufman MRN: [id number] DOB: [birth year] Today's Date: 05/24/2013 Time: 4098-1191 SLP Time Calculation (min): 22 min  Assessment / Plan / Recommendation Clinical Impression  Treatment focused on cognitive reorganization prior to CIR hopefully today.  Pt. Sitting on bed, bending over attempting to pick item off floor as SLP entered room.  She needed mod verbal prompts to problem solve safe outcome to situation.  She also required min verbal prompst to state medical diagnosis for admission.  Intellectual awareness was functional for physical deficits and working memory difficulties.  Pt. Able to recall information from discussion with MD (SLP present) 5 minutes prior.  No paraphasias noted in fluent conversation today as detected during initial assessment.  Recommend ongoing ST for cognitive facilitation as pt.'s goal is to live alone again.     HPI HPI: Ann Kaufman is an 79 y.o. female with a history of atrial fibrillation and pacemaker placement, anticoagulation on Xarelto, and hypertension who was brought to the emergency room after developing speech difficulty and right facial droop as well as seizure activity. She was noted to have focal motor seizure activity on arrival in the emergency room in addition to being unresponsive. She was intubated for airway protection following administration of 2 mg of Ativan which stopped seizure activity. Propofol drip was started for sedation as well as seizure management. CT scan of her head showed a 3.6 cm left frontal intraparenchymal hematoma. There was evidence of left lateral ventricular extension. No signs of hydrocephalus were seen. A loading dose of Keppra was given, 1000 mg. Anticoagulation reversing protocol was also ordered.     Pertinent Vitals No pain  SLP Plan  Continue with current plan of care    Recommendations  Continue ST on CIR              General  recommendations: Rehab consult Oral Care Recommendations: Oral care BID Follow up Recommendations: Inpatient Rehab Plan: Continue with current plan of care    GO     Royce Macadamia M.Ed [employer] [phone]  05/24/2013

## 2013-05-25 ENCOUNTER — Inpatient Hospital Stay (HOSPITAL_COMMUNITY): Payer: Medicare Other | Admitting: Speech Pathology

## 2013-05-25 ENCOUNTER — Inpatient Hospital Stay (HOSPITAL_COMMUNITY): Payer: Medicare Other

## 2013-05-25 ENCOUNTER — Inpatient Hospital Stay (HOSPITAL_COMMUNITY): Payer: Medicare Other | Admitting: Occupational Therapy

## 2013-05-25 DIAGNOSIS — J42 Unspecified chronic bronchitis: Secondary | ICD-10-CM | POA: Diagnosis not present

## 2013-05-25 DIAGNOSIS — J449 Chronic obstructive pulmonary disease, unspecified: Secondary | ICD-10-CM | POA: Diagnosis not present

## 2013-05-25 DIAGNOSIS — J4489 Other specified chronic obstructive pulmonary disease: Secondary | ICD-10-CM | POA: Diagnosis not present

## 2013-05-25 DIAGNOSIS — S0003XA Contusion of scalp, initial encounter: Secondary | ICD-10-CM | POA: Diagnosis not present

## 2013-05-25 DIAGNOSIS — I4891 Unspecified atrial fibrillation: Secondary | ICD-10-CM

## 2013-05-25 DIAGNOSIS — I619 Nontraumatic intracerebral hemorrhage, unspecified: Secondary | ICD-10-CM

## 2013-05-25 DIAGNOSIS — J189 Pneumonia, unspecified organism: Secondary | ICD-10-CM | POA: Diagnosis not present

## 2013-05-25 DIAGNOSIS — I10 Essential (primary) hypertension: Secondary | ICD-10-CM

## 2013-05-25 DIAGNOSIS — J159 Unspecified bacterial pneumonia: Secondary | ICD-10-CM

## 2013-05-25 DIAGNOSIS — M545 Low back pain, unspecified: Secondary | ICD-10-CM | POA: Diagnosis not present

## 2013-05-25 DIAGNOSIS — S8990XA Unspecified injury of unspecified lower leg, initial encounter: Secondary | ICD-10-CM | POA: Diagnosis not present

## 2013-05-25 DIAGNOSIS — M25579 Pain in unspecified ankle and joints of unspecified foot: Secondary | ICD-10-CM | POA: Diagnosis not present

## 2013-05-25 LAB — COMPREHENSIVE METABOLIC PANEL
ALT: 15 U/L (ref 0–35)
AST: 25 U/L (ref 0–37)
Alkaline Phosphatase: 78 U/L (ref 39–117)
Calcium: 8.4 mg/dL (ref 8.4–10.5)
GFR calc Af Amer: >90 mL/min (ref >90)
Glucose, Bld: 105 mg/dL — ABNORMAL HIGH (ref 70–99)
Potassium: 3.5 mEq/L (ref 3.5–5.1)
Sodium: 136 mEq/L (ref 135–145)
Total Protein: 6.7 g/dL (ref 6.0–8.3)

## 2013-05-25 LAB — URINALYSIS, ROUTINE W REFLEX MICROSCOPIC
Leukocytes, UA: NEGATIVE
Nitrite: NEGATIVE
Protein, ur: NEGATIVE mg/dL
Specific Gravity, Urine: 1.008 (ref 1.005–1.030)
pH: 7.5 (ref 5.0–8.0)

## 2013-05-25 LAB — CBC WITH DIFFERENTIAL/PLATELET
Basophils Relative: 1 % (ref 0–1)
Eosinophils Absolute: 0.1 10*3/uL (ref 0.0–0.7)
Eosinophils Relative: 1 % (ref 0–5)
Hemoglobin: 11.5 g/dL — ABNORMAL LOW (ref 12.0–15.0)
Lymphocytes Relative: 14 % (ref 12–46)
Lymphs Abs: 1 10*3/uL (ref 0.7–4.0)
MCHC: 32.5 g/dL (ref 30.0–36.0)
Monocytes Relative: 14 % — ABNORMAL HIGH (ref 3–12)
Neutrophils Relative %: 70 % (ref 43–77)
RBC: 4.16 MIL/uL (ref 3.87–5.11)
WBC: 7.1 10*3/uL (ref 4.0–10.5)

## 2013-05-25 MED ORDER — LEVOFLOXACIN 250 MG PO TABS
250.0000 mg | ORAL_TABLET | Freq: Every day | ORAL | Status: DC
  Administered 2013-05-25 – 2013-05-29 (×5): 250 mg via ORAL
  Filled 2013-05-25 (×7): qty 1

## 2013-05-25 MED ORDER — POTASSIUM CHLORIDE CRYS ER 20 MEQ PO TBCR
20.0000 meq | EXTENDED_RELEASE_TABLET | Freq: Two times a day (BID) | ORAL | Status: AC
  Administered 2013-05-25 (×2): 20 meq via ORAL
  Filled 2013-05-25 (×2): qty 1

## 2013-05-25 NOTE — Evaluation (Signed)
Occupational Therapy Assessment and Plan  Patient Details  Name: Ann Kaufman MRN: [id number] Date of Birth: Mar 21, 1930  OT Diagnosis: cognitive deficits and muscle weakness (generalized) Rehab Potential: Rehab Potential: Good ELOS: 5 -7 days   Today's Date: 05/25/2013 Time: 0815-0900 Time Calculation (min): 45 min  Problem List:  Patient Active Problem List   Diagnosis Date Noted  . Acute respiratory failure 05/19/2013  . Chronic anticoagulation 05/19/2013  . Aspiration pneumonia 05/19/2013  . ICH (intracerebral hemorrhage) 05/18/2013  . Dyspnea on exertion 04/06/2013  . Atrial fibrillation, chronic 04/06/2013  . Atherosclerosis of aorta 04/06/2013  . Hypokalemia 11/28/2011  . Heart palpitations 11/27/2011  . Pleural effusion 11/27/2011  . Atrial fibrillation with RVR 11/27/2011  . HTN (hypertension) 11/27/2011    Past Medical History:  Past Medical History  Diagnosis Date  . Intracerebral hemorrhage     a. 04/2013 in setting of xarelto therapy.  . Atrial fibrillation     a. Dx in 2012;  b. [facility]ytm controlled - amiodarone, previously anticoagulated with xarelto (d/c'd 04/2013 in setting of ICH);  b. 04/2013 Echo: EF 55-60%, Gr 1 DD, mild MR, mod dil LA, PAsP .  . Hypertension   . Depression   . Arthritis   . Presence of permanent cardiac pacemaker     a. 2012 MDT, placed in [place] (probable tachy-brady).  . Pacemaker    Past Surgical History:  Past Surgical History  Procedure Laterality Date  . Pacemaker insertion    . Abdominal hysterectomy    . Breast lumpectomy    . Appendectomy    . Bowel resection      Assessment & Plan Clinical Impression: Patient is a 79 y.o. year old female history of HTN, A fib-on xarelto, PPM; who was admitted on 05/18/13 with speech difficulty, right facial droop and seizure. Treated with ativan and loaded with keppra. Patient intubated for airway protection and CT head with Left frontal lobe intraparenchymal hematoma with  intraventricular hemorrhage within the left lateral ventricle. Patient in acute respiratory distress due to pulmonary edema v/s ALI/ARDS. Empiric antibiotics and BD ordered. Neurology consulted and anticoagulation reversed per [facility]A protocol. Follow up CCT 11/27 with left frontal hematoma and vasogenic edema. 2D echo with EF 55-60% and possible small, oscillating density (? Thrombus; ?vegetation)associated with RA wire vs chiari network. Extubated without difficulty and swallow evaluation without evidence of dysphagia. Therapies initiated and patient with subsequent high level cognitive deficits with decreased safety as well as left visual field impairments.  Patient transferred to CIR on 05/24/2013 .    Patient currently requires steady A to supervision with basic self-care skills secondary to muscle weakness, decreased cardiorespiratoy endurance, decreased awareness, decreased problem solving, decreased safety awareness and decreased memory and decreased higher level balance strategies.  Prior to hospitalization, patient could complete ADL with independent .  Patient will benefit from skilled intervention to decrease level of assist with basic self-care skills and increase independence with basic self-care skills prior to discharge home with care partner.  Anticipate patient will require 24 hour supervision and follow up outpatient.  OT - End of Session Activity Tolerance: Tolerates 10 - 20 min activity with multiple rests Endurance Deficit: Yes OT Assessment Rehab Potential: Good OT Patient demonstrates impairments in the following area(s): Balance;Cognition;Endurance;Motor;Pain;Safety OT Basic ADL's Functional Problem(s): Grooming;Bathing;Dressing;Toileting OT Advanced ADL's Functional Problem(s): Simple Meal Preparation OT Transfers Functional Problem(s): Toilet;Tub/Shower OT Additional Impairment(s): None OT Plan OT Intensity: Minimum of 1-2 x/day, 45 to 90 minutes OT Frequency: 5 out of  7  days OT Duration/Estimated Length of Stay: 5 days OT Treatment/Interventions: Balance/vestibular training;Cognitive remediation/compensation;Discharge planning;Community reintegration;DME/adaptive equipment instruction;Functional mobility training;Pain management;Patient/family education;Self Care/advanced ADL retraining;Therapeutic Exercise;UE/LE Coordination activities;UE/LE Strength taining/ROM;Therapeutic Activities;Skin care/wound managment OT Self Feeding Anticipated Outcome(s): mod I  OT Basic Self-Care Anticipated Outcome(s): mod I  OT Toileting Anticipated Outcome(s): mod I  OT Bathroom Transfers Anticipated Outcome(s): mod I  OT Recommendation Recommendations for Other Services: Neuropsych consult Patient destination: Home Follow Up Recommendations: Home health OT Equipment Recommended: To be determined   Skilled Therapeutic Intervention 1:1 OT eval initiated with OT goals, purpose and role with daughter and pt. (daughter left after pt came to EOB.) Self care retraining at shower level with focus on dynamic standing balance, one limb stance, functional ambulation with steady A to supervision, simple problem solving, awareness, sequencing and task organization. Pt did not sleep last night. Pt required min A to connect ICH to why she was in the hospital. After talking with team pt more difficulty with language during their session - not observed during this session.   OT Evaluation Precautions/Restrictions  Precautions Precautions: Fall Restrictions Weight Bearing Restrictions: No General Chart Reviewed: Yes Amount of Missed OT Time (min): 15 Minutes Missed Time Reason: X-Ray Family/Caregiver Present: Yes (met with daughter [name]) Vital Signs Therapy Vitals Pulse Rate: 69 BP: 170/83 mmHg Patient Position, if appropriate: Lying Pain Pain Assessment Pain Assessment: 0-10 Pain Score: 6  Pain Type: Acute pain Pain Location: Head Pain Orientation: Mid Pain Radiating  Towards: back of thighs Pain Descriptors / Indicators: Aching Pain Frequency: Constant Pain Onset: On-going Patients Stated Pain Goal: 3 Pain Intervention(s): Medication (See eMAR) Home Living/Prior Functioning Home Living Family/patient expects to be discharged to:: Private residence Living Arrangements: Alone Type of Home: Apartment Home Layout: One level  Lives With: Alone ADL  see FIM Vision/Perception  Vision - History Baseline Vision: Wears glasses only for reading Patient Visual Report: No change from baseline Vision - Assessment Eye Alignment: Within Functional Limits Additional Comments: decr smoothness of saccadic movement Perception Perception: Within Functional Limits Praxis Praxis: Intact  Cognition Overall Cognitive Status: Impaired/Different from baseline Arousal/Alertness: Awake/alert Orientation Level: Oriented to person;Oriented to time;Disoriented to situation Attention: Focused;Sustained Focused Attention: Appears intact Sustained Attention: Appears intact Memory: Impaired Memory Impairment: Decreased recall of new information Awareness: Impaired Awareness Impairment: Intellectual impairment;Emergent impairment Problem Solving: Impaired Problem Solving Impairment: Functional complex Sequencing: Appears intact Behaviors: Restless (last night restles) Safety/Judgment: Impaired Sensation Sensation Light Touch: Appears Intact Hot/Cold: Appears Intact Proprioception: Appears Intact Coordination Gross Motor Movements are Fluid and Coordinated: Yes Fine Motor Movements are Fluid and Coordinated: Yes Motor  Motor Motor: Within Functional Limits Motor - Skilled Clinical Observations: generalized weakness Mobility  Bed Mobility Rolling Left: 4: Min assist Left Sidelying to Sit: 4: Min guard Sitting - Scoot to Edge of Bed: 4: Min assist Transfers Transfers: Sit to Stand;Stand to Sit Sit to Stand: 5: Supervision Stand to Sit: 5: Supervision   Trunk/Postural Assessment  Cervical Assessment Cervical Assessment: Within Functional Limits Thoracic Assessment Thoracic Assessment: Within Functional Limits Lumbar Assessment Lumbar Assessment: Within Functional Limits Postural Control Postural Control: Within Functional Limits  Balance Static Sitting Balance Static Sitting - Level of Assistance: 6: Modified independent (Device/Increase time) Static Standing Balance Static Standing - Level of Assistance: 5: Stand by assistance Extremity/Trunk Assessment RUE Assessment RUE Assessment: Within Functional Limits LUE Assessment LUE Assessment: Within Functional Limits  FIM:  FIM - Eating Eating Activity: 5: Set-up assist for cut food FIM - Grooming  Grooming Steps: Wash, rinse, dry face;Wash, rinse, dry hands;Oral care, brush teeth, clean dentures;Brush, comb hair Grooming: 4: Patient completes 3 of 4 or 4 of 5 steps FIM - Bathing Bathing Steps Patient Completed: Chest;Right Arm;Left Arm;Abdomen;Front perineal area;Buttocks;Right upper leg;Left upper leg;Right lower leg (including foot);Left lower leg (including foot) Bathing: 4: Steadying assist FIM - Upper Body Dressing/Undressing Upper body dressing/undressing steps patient completed: Thread/unthread right bra strap;Thread/unthread left bra strap;Hook/unhook bra;Thread/unthread right sleeve of pullover shirt/dresss;Thread/unthread left sleeve of pullover shirt/dress;Put head through opening of pull over shirt/dress;Pull shirt over trunk Upper body dressing/undressing: 5: Supervision: Safety issues/verbal cues FIM - Lower Body Dressing/Undressing Lower body dressing/undressing steps patient completed: Thread/unthread right underwear leg;Thread/unthread left underwear leg;Pull underwear up/down;Thread/unthread right pants leg;Thread/unthread left pants leg;Pull pants up/down;Don/Doff right sock;Don/Doff left sock Lower body dressing/undressing: 4: Steadying Assist FIM -  Toileting Toileting steps completed by patient: Adjust clothing prior to toileting;Performs perineal hygiene;Adjust clothing after toileting Toileting: 5: Supervision: Safety issues/verbal cues FIM - Bed/Chair Transfer Bed/Chair Transfer: 4: Supine > Sit: Min A (steadying Pt. > 75%/lift 1 leg);5: Bed > Chair or W/C: Supervision (verbal cues/safety issues) FIM - Archivist Transfers: 5-To toilet/BSC: Supervision (verbal cues/safety issues);5-From toilet/BSC: Supervision (verbal cues/safety issues) FIM - Tub/Shower Transfers Tub/shower Transfers: 5-Into Tub/Shower: Supervision (verbal cues/safety issues);5-Out of Tub/Shower: Supervision (verbal cues/safety issues)   Refer to Care Plan for Long Term Goals  Recommendations for other services: Neuropsych  Discharge Criteria: Patient will be discharged from OT if patient refuses treatment 3 consecutive times without medical reason, if treatment goals not met, if there is a change in medical status, if patient makes no progress towards goals or if patient is discharged from hospital.  The above assessment, treatment plan, treatment alternatives and goals were discussed and mutually agreed upon: by patient  Adan Sis 05/25/2013, 9:22 AM

## 2013-05-25 NOTE — Progress Notes (Signed)
Subjective/Complaints: Pt confused last night. Very restless. Complains of low back pain. Used some heating pads. Didn't sleep at all. Threatening to leave last night apparently. A 12 point review of systems has been performed and if not noted above is otherwise negative.   Objective: Vital Signs: Blood pressure 170/83, pulse 69, temperature 96.9 F (36.1 C), temperature source Oral, resp. rate 18, weight 75 kg (165 lb 5.5 oz), SpO2 92.00%. No results found.  Recent Labs  05/23/13 0500 05/25/13 0524  WBC 7.1 7.1  HGB 10.9* 11.5*  HCT 33.7* 35.4*  PLT 178 187    Recent Labs  05/23/13 0500 05/25/13 0524  NA 138 136  K 4.3 3.5  CL 105 102  GLUCOSE 116* 105*  BUN 11 13  CREATININE 0.64 0.72  CALCIUM 8.4 8.4   CBG (last 3)   Recent Labs  05/22/13 1201 05/22/13 1612 05/22/13 2005  GLUCAP 110* 104* 117*    Wt Readings from Last 3 Encounters:  05/25/13 75 kg (165 lb 5.5 oz)  05/24/13 78.291 kg (172 lb 9.6 oz)  04/06/13 74.481 kg (164 lb 3.2 oz)    Physical Exam:  Constitutional: She is oriented to person, place, and time. She appears well-developed and well-nourished.  HENT:  Head: Normocephalic and atraumatic.  Eyes: Conjunctivae and EOM are normal. Pupils are equal, round, and reactive to light. Right eye exhibits no discharge. Left eye exhibits no discharge. No scleral icterus.  Neck: Normal range of motion. Neck supple.  Cardiovascular: Normal rate and regular rhythm. Exam reveals no gallop and no friction rub.  No murmur heard.  Respiratory: Effort normal. No respiratory distress. She has no wheezes.  GI: Soft. Bowel sounds are normal. She exhibits no distension. There is no tenderness.  Musculoskeletal: She exhibits no edema. Tenderness with palpation of the lower lumbar spine, didn't appreciate any definite muscle spasm.  Neurological: She is drowsy, distracted, confused. Limited insight and awareness Right facial weakness. Speech with mild dysarthria  which has improved somewht from yesterday .Follows two step commands without difficulty. Mild right sided weakness, grossly 4/5 RUE prox to distal. RLE is 4-HF to 4/5 at ankle. Decreased FTN with right hand. No gross sensory changes.  Skin: Skin is warm and dry.  A few limb bruises noted  Psychiatric: restless and distracted   Assessment/Plan: 1. Functional deficits secondary to left frontal hematoma which require 3+ hours per day of interdisciplinary therapy in a comprehensive inpatient rehab setting. Physiatrist is providing close team supervision and 24 hour management of active medical problems listed below. Physiatrist and rehab team continue to assess barriers to discharge/monitor patient progress toward functional and medical goals. FIM:          FIM - Archivist Transfers: 7-Independent: No helper        Comprehension Comprehension Mode: Auditory Comprehension: 5-Understands basic 90% of the time/requires cueing < 10% of the time  Expression Expression Mode: Verbal Expression: 5-Expresses basic 90% of the time/requires cueing < 10% of the time.  Social Interaction Social Interaction: 5-Interacts appropriately 90% of the time - Needs monitoring or encouragement for participation or interaction.  Problem Solving Problem Solving: 7-Solves complex problems: Recognizes & self-corrects  Memory Memory: 7-Complete Independence: No helper  Medical Problem List and Plan:  ICH with seizure activity--repeat CCT in about a week to decide on ASA resumption.  1. DVT Prophylaxis/Anticoagulation: Pharmaceutical: Lovenox  2. Pain Management: Continue tylenol prn for headaches. Add muscle relaxers for exacerbation of chronic back pain  -check  lumbar xrays  -kpad for back. Need to limit narcs to avoid excessive confusion  3. H/o depression/Mood: team egosuport, sw follow up  4. Neuropsych: This patient is not capable of making decisions on her own behalf at this  moment.  5. A fib with PPM: Continue amiodarone and metoprolol. No anticoagulation at this time and f/u with cards in [place].  7. New onset seizure: on keppra bid 8. Aspiration PNA: On antibiotic D#7/7 9. Confusion: labs unremarkable so far. Need to check urine as she has low grade temp  -recheck cxr also  -limit neurosedating meds  LOS (Days) 1 A FACE TO FACE EVALUATION WAS PERFORMED  Rashid Whitenight T 05/25/2013 8:27 AM

## 2013-05-25 NOTE — Evaluation (Signed)
Physical Therapy Assessment and Plan  Patient Details  Name: Ann Kaufman MRN: [id number] Date of Birth: [birth year]  PT Diagnosis: Abnormality of gait, Cognitive deficits, Muscle weakness, Decreased functional endurance Rehab Potential: Good ELOS: 5-7days   Today's Date: 05/25/2013 Time: 1100-1150 Time Calculation (min): 50 min  Problem List:  Patient Active Problem List   Diagnosis Date Noted  . Acute respiratory failure 05/19/2013  . Chronic anticoagulation 05/19/2013  . Aspiration pneumonia 05/19/2013  . ICH (intracerebral hemorrhage) 05/18/2013  . Dyspnea on exertion 04/06/2013  . Atrial fibrillation, chronic 04/06/2013  . Atherosclerosis of aorta 04/06/2013  . Hypokalemia 11/28/2011  . Heart palpitations 11/27/2011  . Pleural effusion 11/27/2011  . Atrial fibrillation with RVR 11/27/2011  . HTN (hypertension) 11/27/2011    Past Medical History:  Past Medical History  Diagnosis Date  . Intracerebral hemorrhage     a. 04/2013 in setting of xarelto therapy.  . Atrial fibrillation     a. Dx in 2012;  b. Rhytm controlled - amiodarone, previously anticoagulated with xarelto (d/c'd 04/2013 in setting of ICH);  b. 04/2013 Echo: EF 55-60%, Gr 1 DD, mild MR, mod dil LA, PAsP .  . Hypertension   . Depression   . Arthritis   . Presence of permanent cardiac pacemaker     a. 2012 MDT, placed in [place] (probable tachy-brady).  . Pacemaker    Past Surgical History:  Past Surgical History  Procedure Laterality Date  . Pacemaker insertion    . Abdominal hysterectomy    . Breast lumpectomy    . Appendectomy    . Bowel resection      Assessment & Plan Clinical Impression: Ann Kaufman is a 79 y.o. female with history of HTN, A fib-on xarelto, PPM; who was admitted on 05/18/13 with speech difficulty, right facial droop and seizure. Treated with ativan and loaded with keppra. Patient intubated for airway protection and CT head with Left frontal lobe intraparenchymal  hematoma with intraventricular hemorrhage within the left lateral ventricle. Patient in acute respiratory distress due to pulmonary edema v/s ALI/ARDS. Empiric antibiotics and BD ordered. Neurology consulted and anticoagulation reversed per FEIBA protocol. Follow up CCT 11/27 with left frontal hematoma and vasogenic edema. 2D echo with EF 55-60% and possible small, oscillating density (? Thrombus; ?vegetation)associated with RA wire vs chiari network. Extubated without difficulty and swallow evaluation without evidence of dysphagia. Therapies initiated and patient with subsequent high level cognitive deficits with decreased safety as well as left visual field impairments. Therapy team recommended CIR and patient admitted today. Patient transferred to CIR on 05/24/2013 .   Patient currently requires supervision-min assist with mobility secondary to muscle weakness, decreased cardiorespiratoy endurance and decreased safety awareness.  Prior to hospitalization, patient was independent  with mobility and lived with Alone in a Apartment home.  Home access is 1/2 step enteranceStairs to enter.  Patient will benefit from skilled PT intervention to maximize safe functional mobility, minimize fall risk and decrease caregiver burden for planned discharge home with 24 hour supervision.  Anticipate patient will benefit from follow up OP at discharge.  PT - End of Session Activity Tolerance: Tolerates 30+ min activity with multiple rests Endurance Deficit: Yes PT Assessment Rehab Potential: Good Barriers to Discharge: Decreased caregiver support PT Patient demonstrates impairments in the following area(s): Balance;Endurance;Safety PT Transfers Functional Problem(s): Bed Mobility;Bed to Chair;Car;Furniture;Floor PT Locomotion Functional Problem(s): Ambulation;Stairs PT Plan PT Intensity: Minimum of 1-2 x/day ,45 to 90 minutes PT Frequency: 5 out of 7 days PT  Duration Estimated Length of Stay: 3-5 days PT  Treatment/Interventions: Ambulation/gait training;Balance/vestibular training;Discharge planning;DME/adaptive equipment instruction;Functional mobility training;Pain management;Therapeutic Activities;UE/LE Strength taining/ROM;UE/LE Coordination activities;Therapeutic Exercise;Stair training;Patient/family education;Neuromuscular re-education;Disease management/prevention;Community reintegration;Cognitive remediation/compensation PT Transfers Anticipated Outcome(s): Mod(I) PT Locomotion Anticipated Outcome(s): Mod(I) PT Recommendation Follow Up Recommendations: Outpatient PT;Home health PT;24 hour supervision/assistance (24 hr supervision 2/2 cognitive impairments) Patient destination: Home Equipment Recommended: To be determined  Skilled Therapeutic Intervention 1:1. Pt received supine in bed resting, verbalized feeling fatigued from restless night. PT evaluation performed, see detailed objective information below. Pt A&Ox3, disoriented to situation. Higher level cognitive deficits, intermittent language of confusion and balance deficits noted during tx session. Tx session focused on pathfinding and higher level standing dynamic balance challenges. Pt able to pathfind room 2x throughout session visually, but unable to remember direction/room number. Berg Balance Test performed, pt scored 41/56 points, therefore remains at risk for falls. Pt completing ball toss activity +/- forward/backward ambulation w/ min guard to min A. Pt req close (S) to min guard for all remaining standing functional mobility. Pt missed at end of session 2/2 fatigue from restless night. Pt supine in bed sleeping at end of session w/ all needs in reach and bed alarm on.    PT Evaluation Precautions/Restrictions Precautions Precautions: Fall Restrictions Weight Bearing Restrictions: No General Chart Reviewed: Yes Amount of Missed PT Time (min): 10 Minutes Missed Time Reason: Patient fatigue Family/Caregiver Present:  No Vital Signs  Pain Pain Assessment Pain Assessment: Faces Faces Pain Scale: Hurts even more Pain Location: Back Pain Descriptors / Indicators: Aching Pain Intervention(s): Repositioned;Distraction Home Living/Prior Functioning Home Living Available Help at Discharge: Available PRN/intermittently Type of Home: Apartment Home Access: Stairs to enter Secretary/administrator of Steps: 1/2 step enterance Home Layout: One level  Lives With: Alone Prior Function Level of Independence: Independent with basic ADLs;Independent with homemaking with ambulation;Independent with gait;Independent with transfers  Able to Take Stairs?: Reciprically Driving: Yes 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 Vision/Perception  Vision - History Baseline Vision: Wears glasses only for reading Patient Visual Report: No change from baseline Vision - Assessment Eye Alignment: Within Functional Limits Additional Comments: decr smoothness of saccadic movement Perception Perception: Within Functional Limits Praxis Praxis: Intact  Cognition Overall Cognitive Status: Impaired/Different from baseline Arousal/Alertness: Awake/alert Orientation Level: Oriented to person;Oriented to time;Disoriented to situation;Oriented to place Attention: Selective Focused Attention: Appears intact Sustained Attention: Appears intact Selective Attention: Appears intact Memory: Impaired Memory Impairment: Decreased recall of new information Awareness: Impaired Awareness Impairment: Intellectual impairment;Emergent impairment Problem Solving: Impaired Problem Solving Impairment: Functional complex Executive Function: Self Monitoring;Self Correcting Sequencing: Appears intact Self Monitoring: Impaired Self Monitoring Impairment: Functional complex Self Correcting: Impaired Self Correcting Impairment: Functional complex Behaviors: Restless Safety/Judgment:  Impaired Sensation Sensation Light Touch: Appears Intact Hot/Cold: Appears Intact Proprioception: Appears Intact Coordination Gross Motor Movements are Fluid and Coordinated: Yes Fine Motor Movements are Fluid and Coordinated: Yes Motor  Motor Motor: Within Functional Limits Motor - Skilled Clinical Observations: generalized weakness  Mobility Bed Mobility Bed Mobility: Supine to Sit;Sit to Supine Rolling Left: 4: Min assist Left Sidelying to Sit: 4: Min guard Supine to Sit: 5: Supervision Supine to Sit Details: Verbal cues for precautions/safety Sitting - Scoot to Edge of Bed: 4: Min assist Sit to Supine: 5: Supervision Sit to Supine - Details: Verbal cues for precautions/safety Transfers Transfers: Yes Sit to Stand: 4: Min assist;5: Supervision Sit to Stand Details: Tactile cues for weight shifting;Verbal cues for precautions/safety Stand to Sit: 5: Supervision;4: Min  assist Stand to Sit Details (indicate cue type and reason): Verbal cues for precautions/safety Stand to Sit Details: verbal cues to reach back prior to sitting down Stand Pivot Transfers: 4: Min assist Stand Pivot Transfer Details: Verbal cues for precautions/safety Locomotion  Ambulation Ambulation: Yes Ambulation/Gait Assistance: 4: Min guard;4: Min assist Ambulation Distance (Feet): 200 Feet Assistive device: None Ambulation/Gait Assistance Details: Verbal cues for precautions/safety;Tactile cues for posture Gait Gait: Yes Gait Pattern: Step-through pattern;Wide base of support Gait velocity: decreased speed of ambulation, unsteady, hard B weight shifts, unable to ambulate in straight path. Pt presenting w/ posterior lean in standing- resting on hip flexors and flexed knees High Level Ambulation High Level Ambulation: Backwards walking;Direction changes Backwards Walking: decreased speed vs. forward walking, no LOB noted Direction Changes: no LOB noted Stairs / Additional Locomotion Stairs:  Yes Stairs Assistance: 4: Min guard Stair Management Technique: Two rails Number of Stairs: 10 Wheelchair Mobility Wheelchair Mobility: No (Pt at completely ambulatory level)  Trunk/Postural Assessment  Cervical Assessment Cervical Assessment: Within Functional Limits Thoracic Assessment Thoracic Assessment: Within Functional Limits Lumbar Assessment Lumbar Assessment: Exceptions to [facility] Lumbar AROM Overall Lumbar AROM Comments: lordosis noted in standing. Pt verbalized increased discomfort when standing vs. sitting Postural Control Postural Control: Within Functional Limits  Balance Balance Balance Assessed: Yes Standardized Balance Assessment Standardized Balance Assessment: Berg Balance Test Berg Balance Test Sit to Stand: Able to stand without using hands and stabilize independently Standing Unsupported: Able to stand safely 2 minutes Sitting with Back Unsupported but Feet Supported on Floor or Stool: Able to sit safely and securely 2 minutes Stand to Sit: Sits safely with minimal use of hands Transfers: Able to transfer safely, minor use of hands Standing Unsupported with Eyes Closed: Able to stand 3 seconds Standing Ubsupported with Feet Together: Able to place feet together independently and stand for 1 minute with supervision From Standing, Reach Forward with Outstretched Arm: Can reach forward >12 cm safely (5") From Standing Position, Pick up Object from Floor: Able to pick up shoe, needs supervision From Standing Position, Turn to Look Behind Over each Shoulder: Turn sideways only but maintains balance Turn 360 Degrees: Able to turn 360 degrees safely but slowly Standing Unsupported, Alternately Place Feet on Step/Stool: Able to complete 4 steps without aid or supervision Standing Unsupported, One Foot in Front: Able to plae foot ahead of the other independently and hold 30 seconds Standing on One Leg: Tries to lift leg/unable to hold 3 seconds but remains standing  independently Total Score: 41 Static Sitting Balance Static Sitting - Balance Support: No upper extremity supported;Feet supported Static Sitting - Level of Assistance: 6: Modified independent (Device/Increase time) Dynamic Sitting Balance Dynamic Sitting - Balance Support: Right upper extremity supported;Left upper extremity supported;No upper extremity supported;Feet supported Dynamic Sitting - Level of Assistance: 5: Stand by assistance Dynamic Sitting - Balance Activities: Lateral lean/weight shifting;Forward lean/weight shifting;Reaching for weighted objects;Reaching across midline Static Standing Balance Static Standing - Balance Support: No upper extremity supported Static Standing - Level of Assistance: 5: Stand by assistance;4: Min assist Dynamic Standing Balance Dynamic Standing - Balance Support: Right upper extremity supported;Left upper extremity supported;No upper extremity supported;During functional activity Dynamic Standing - Level of Assistance: 4: Min assist Dynamic Standing - Balance Activities: Forward lean/weight shifting;Ball toss;Reaching for objects Extremity Assessment  RUE Assessment RUE Assessment: Within Functional Limits LUE Assessment LUE Assessment: Within Functional Limits RLE Assessment RLE Assessment: Within Functional Limits (Grossly 4/5) RLE Strength RLE Overall Strength Comments: Endurance deficit noted LLE  Assessment LLE Assessment: Within Functional Limits (Grossly 4/5) LLE Strength LLE Overall Strength Comments: Endurance deficit noted  FIM:  FIM - Bed/Chair Transfer Bed/Chair Transfer: 5: Bed > Chair or W/C: Supervision (verbal cues/safety issues);5: Chair or W/C > Bed: Supervision (verbal cues/safety issues);5: Supine > Sit: Supervision (verbal cues/safety issues);5: Sit > Supine: Supervision (verbal cues/safety issues) FIM - Locomotion: Wheelchair Locomotion: Wheelchair: 0: Activity did not occur (Pt consistently ambulating throughout tx  ) FIM - Locomotion: Ambulation Ambulation/Gait Assistance: 4: Min guard;4: Min assist Locomotion: Ambulation: 4: Travels 150 ft or more with minimal assistance (Pt.>75%) FIM - Locomotion: Stairs Locomotion: Building control surveyor: Hand rail - 2 Locomotion: Stairs: 2: Up and Down 4 - 11 stairs with minimal assistance (Pt.>75%)   Refer to Care Plan for Long Term Goals  Recommendations for other services: None  Discharge Criteria: Patient will be discharged from PT if patient refuses treatment 3 consecutive times without medical reason, if treatment goals not met, if there is a change in medical status, if patient makes no progress towards goals or if patient is discharged from hospital.  The above assessment, treatment plan, treatment alternatives and goals were discussed and mutually agreed upon: by patient  Denzil Hughes 05/25/2013, 12:21 PM

## 2013-05-25 NOTE — Progress Notes (Signed)
CXR with worsening of RUL opacity with concerns of aspiration PNA v/s asymmetric edema. With low grade fever as well as confusion will start Levaquin. Monitor for fevers. Speech may need to reevaluate swallow.

## 2013-05-25 NOTE — Progress Notes (Signed)
Patient refused all scheduled meds including the Ampicillin IVPB. She is agitated and paranoid, walking in and out of room. She stated she wants to see the doctor before she takes any medication. She refused to stay in bed; packed her belongings stating she's getting out of here. Daughter called and at bedside. Spoke to [employer], [place] about patient's condition. She ordered a 1 time dose, xanax 0.25 that was administered at 2319. Patient's restless. The most she's stayed in bed was 30 minutes. Complaining of low back pain that radiates to back of thighs. Tramadol 50 mg given at 2319, Roboxin 500 mg @ 2318 Tyl 650 mg @ 0420. She is currently lying in bed. Call bell within reach. Will continue to monitor. Kharson Rasmusson A. Lazlo Tunney, RN

## 2013-05-25 NOTE — Progress Notes (Signed)
Physical Therapy Session Note  Patient Details  Name: Ann Kaufman MRN: [id number] Date of Birth: [birth year]  Today's Date: 05/25/2013 Time: 8295-6213 Time Calculation (min): 15 min  Short Term Goals: Week 1:  PT Short Term Goal 1 (Week 1): STGs=LTGs due to anticipated LOS  Skilled Therapeutic Interventions/Progress Updates:    Pt presents asleep in the bed and difficult to awaken. Pt difficulty maintaining alertness while therapist attempting to engage pt and reorient pt to place, situation, and surroundings. When pt awake enough to have conversation, unable to answer questions appropriately (ex. When asked how long pt's head was bothering her, pt reports "I ate the refrigerator." When asked what she had for lunch, she replied with "I went shopping at [employer]"). Redirected pt as able, and when encouraged for OOB, pt refused and continually falling back asleep. RN made aware and pt missed last 15 in of session.  Therapy Documentation Precautions:  Precautions Precautions: Fall Restrictions Weight Bearing Restrictions: No General: Amount of Missed PT Time (min): 15 Minutes Missed Time Reason: Patient unwilling/refused to participate without medical reason;Patient fatigue Pain: C/o headache initially once aroused - RN made aware.  See FIM for current functional status  Therapy/Group: Individual Therapy  Karolee Stamps [facility] 05/25/2013, 4:21 PM

## 2013-05-25 NOTE — Evaluation (Signed)
Speech Language Pathology Assessment and Plan  Patient Details  Name: Ann Kaufman MRN: [id number] Date of Birth: 01-02-1930  SLP Diagnosis: Cognitive Impairments;Aphasia  Rehab Potential: Excellent ELOS: 5-7 days   Today's Date: 05/25/2013 Time: 9562-1308 Time Calculation (min): 50 min  Skilled Therapeutic Intervention: Administered cognitive-linguistic evaluation. Please see below for details.   Problem List:  Patient Active Problem List   Diagnosis Date Noted  . Acute respiratory failure 05/19/2013  . Chronic anticoagulation 05/19/2013  . Aspiration pneumonia 05/19/2013  . ICH (intracerebral hemorrhage) 05/18/2013  . Dyspnea on exertion 04/06/2013  . Atrial fibrillation, chronic 04/06/2013  . Atherosclerosis of aorta 04/06/2013  . Hypokalemia 11/28/2011  . Heart palpitations 11/27/2011  . Pleural effusion 11/27/2011  . Atrial fibrillation with RVR 11/27/2011  . HTN (hypertension) 11/27/2011   Past Medical History:  Past Medical History  Diagnosis Date  . Intracerebral hemorrhage     a. 04/2013 in setting of xarelto therapy.  . Atrial fibrillation     a. Dx in 2012;  b. Rhytm controlled - amiodarone, previously anticoagulated with xarelto (d/c'd 04/2013 in setting of ICH);  b. 04/2013 Echo: EF 55-60%, Gr 1 DD, mild MR, mod dil LA, PAsP .  . Hypertension   . Depression   . Arthritis   . Presence of permanent cardiac pacemaker     a. 2012 MDT, placed in [place] (probable tachy-brady).  . Pacemaker    Past Surgical History:  Past Surgical History  Procedure Laterality Date  . Pacemaker insertion    . Abdominal hysterectomy    . Breast lumpectomy    . Appendectomy    . Bowel resection      Assessment / Plan / Recommendation Clinical Impression  Patient is an 79 y.o. year old female history of HTN, A fib-on xarelto, PPM; who was admitted on 05/18/13 with speech difficulty, right facial droop and seizure. Treated with ativan and loaded with keppra.  Patient intubated for airway protection and CT head with Left frontal lobe intraparenchymal hematoma with intraventricular hemorrhage within the left lateral ventricle. Patient in acute respiratory distress due to pulmonary edema v/s ALI/ARDS. Empiric antibiotics and BD ordered. Neurology consulted and anticoagulation reversed per FEIBA protocol. Follow up CCT 11/27 with left frontal hematoma and vasogenic edema. 2D echo with EF 55-60% and possible small, oscillating density (? Thrombus; ?vegetation)associated with RA wire vs chiari network. Extubated without difficulty and swallow evaluation without evidence of dysphagia. Therapies initiated and patient with subsequent high level cognitive deficits with decreased safety. Patient transferred to CIR on 05/24/2013 and demonstrates moderate cognitive impairments characterized by decreased awareness, safety awareness, functional problems solving and working memory. Pt also demonstrated impaired verbal expression, however, difficult to differentiate between language of confusion vs. expressive deficits. Pt's overall function was also impacted by pt's fatigue. Pt consumed thin liquids via cup throughout session without overt s/s of aspiration. Pt would benefit from skilled SLP intervention to maximize cognitive function and for further evaluation of verbal expression abilities.     SLP Assessment  Patient will need skilled Speech Lanaguage Pathology Services during CIR admission    Recommendations  Patient destination: Home Follow up Recommendations: 24 hour supervision/assistance;Outpatient SLP Equipment Recommended: None recommended by SLP    SLP Frequency 5 out of 7 days   SLP Treatment/Interventions Cueing hierarchy;Cognitive remediation/compensation;Environmental controls;Internal/external aids;Speech/Language facilitation;Patient/family education;Therapeutic Activities;Functional tasks    Pain Pain Assessment Pain Assessment: 0-10 Pain Score: 4  Pain  Type: Acute pain Pain Location: Head Pain Orientation: Other (Comment) (and back)  Pain Descriptors / Indicators: Aching Pain Frequency: Constant Pain Onset: On-going Patients Stated Pain Goal: 3 Pain Intervention(s): Medication (See eMAR)  Short Term Goals: Week 1: SLP Short Term Goal 1 (Week 1): Pt will demonstrate functional problem solving for mildly complex tasks with Min A verbal and visual cues. SLP Short Term Goal 2 (Week 1): Pt will utilize external memory aids to recall new, daily information with supervision verbal cues.  SLP Short Term Goal 3 (Week 1): Pt will self-monitor and correct verbal errors with supervision verbal and question cues.  SLP Short Term Goal 4 (Week 1): Pt will demonstrate appropriate safety awareness and request assistance with supervision question cues.   See FIM for current functional status Refer to Care Plan for Long Term Goals  Recommendations for other services: Neuropsych  Discharge Criteria: Patient will be discharged from SLP if patient refuses treatment 3 consecutive times without medical reason, if treatment goals not met, if there is a change in medical status, if patient makes no progress towards goals or if patient is discharged from hospital.  The above assessment, treatment plan, treatment alternatives and goals were discussed and mutually agreed upon: by patient  Florina Glas 05/25/2013, 4:29 PM

## 2013-05-25 NOTE — Progress Notes (Signed)
Patient is alert and disoriented to time/place.  Patient can be confused at times and impulsive.  Educate patient about safety and how to call for assistance.  Bed alarm in place, call bell within reach.  Will continue to monitor patient for safety.

## 2013-05-25 NOTE — Progress Notes (Signed)
Patient information reviewed and entered into eRehab system by Defne Gerling, RN, CRRN, PPS Coordinator.  Information including medical coding and functional independence measure will be reviewed and updated through discharge.     Per nursing patient was given "Data Collection Information Summary for Patients in Inpatient Rehabilitation Facilities with attached "Privacy Act Statement-Health Care Records" upon admission.  

## 2013-05-26 ENCOUNTER — Inpatient Hospital Stay (HOSPITAL_COMMUNITY): Payer: Medicare Other

## 2013-05-26 ENCOUNTER — Ambulatory Visit (HOSPITAL_COMMUNITY): Payer: Medicare Other | Admitting: Occupational Therapy

## 2013-05-26 ENCOUNTER — Inpatient Hospital Stay (HOSPITAL_COMMUNITY): Payer: Medicare Other | Admitting: Speech Pathology

## 2013-05-26 DIAGNOSIS — R55 Syncope and collapse: Secondary | ICD-10-CM

## 2013-05-26 DIAGNOSIS — I619 Nontraumatic intracerebral hemorrhage, unspecified: Secondary | ICD-10-CM

## 2013-05-26 LAB — URINE CULTURE: Culture: NO GROWTH

## 2013-05-26 MED ORDER — TRAMADOL HCL 50 MG PO TABS
50.0000 mg | ORAL_TABLET | Freq: Four times a day (QID) | ORAL | Status: DC | PRN
  Administered 2013-05-26 – 2013-06-06 (×7): 50 mg via ORAL
  Filled 2013-05-26 (×10): qty 1

## 2013-05-26 MED ORDER — LIDOCAINE 5 % EX PTCH
2.0000 | MEDICATED_PATCH | CUTANEOUS | Status: DC
  Administered 2013-05-26 – 2013-06-08 (×13): 2 via TRANSDERMAL
  Filled 2013-05-26 (×15): qty 2

## 2013-05-26 NOTE — Progress Notes (Signed)
Occupational Therapy Session Note  Patient Details  Name: Ann Kaufman MRN: [id number] Date of Birth: [birth year]  Today's Date: 05/26/2013 Time: 0800-0900 Time Calculation (min): 60 min  Short Term Goals: Week 1:  OT Short Term Goal 1 (Week 1): STG=LTG  Skilled Therapeutic Interventions/Progress Updates:    Pt resting in bed eating breakfast upon arrival.  Pt agreeable to bathing at shower level.  Patient exhibited difficulty when attempting to stand up and her right knee buckled.  Pt stated that she was experiencing increased pain in her right lower back/buttocks with some shooting pain down into her RLE.  Pt was able to stand after three attempts and amb with HHA (mod A) to bathroom to use toilet and transfer to shower.  Pt required mod A for standing in shower secondary to increased pain with standing.  Pt completed bathing and dressing tasks with steady A.  Pt requested to return to bed at end of session.  Pt completed all tasks appropriately throughout session and did not exhibit any difficulties with sequencing or task initiation.    Therapy Documentation Precautions:  Precautions Precautions: Fall Restrictions Weight Bearing Restrictions: No Pain: Pain Assessment Pain Assessment: 0-10 Pain Score: 8  Pain Type: Acute pain Pain Location: Hip Pain Orientation: Right Pain Descriptors / Indicators: Aching;Shooting Pain Onset: With Activity Pain Intervention(s): RN made aware;Repositioned  See FIM for current functional status  Therapy/Group: Individual Therapy  Rich Brave 05/26/2013, 9:03 AM

## 2013-05-26 NOTE — Progress Notes (Signed)
Speech Language Pathology Daily Session Note  Patient Details  Name: ROSALBA TOTTY MRN: [id number] Date of Birth: 1929/10/18  Today's Date: 05/26/2013 Time: 1030-1125 Time Calculation (min): 55 min  Short Term Goals: Week 1: SLP Short Term Goal 1 (Week 1): Pt will demonstrate functional problem solving for mildly complex tasks with Min A verbal and visual cues. SLP Short Term Goal 2 (Week 1): Pt will utilize external memory aids to recall new, daily information with supervision verbal cues.  SLP Short Term Goal 3 (Week 1): Pt will self-monitor and correct verbal errors with supervision verbal and question cues.  SLP Short Term Goal 4 (Week 1): Pt will demonstrate appropriate safety awareness and request assistance with supervision question cues.   Skilled Therapeutic Interventions: Treatment focus on cognitive-linguistic goals. Upon arrival, pt awake in bed but was able to sit EOB to participate in therapy. Pt demonstrated decreased sustained attention to task due to constant pain in lower back and constantly switching positions in bed. SLP facilitated session by providing Mod A verbal and visual cues for functional problem solving with basic money management tasks. Suspect function was impacted by pain and language impairments. At end of session, pt demonstrated decreased word-finding and decreased ability to self-monitor and correct errors. Continue plan of care.    FIM:  Comprehension Comprehension Mode: Auditory Comprehension: 5-Understands basic 90% of the time/requires cueing < 10% of the time Expression Expression Mode: Verbal Expression: 4-Expresses basic 75 - 89% of the time/requires cueing 10 - 24% of the time. Needs helper to occlude trach/needs to repeat words. Social Interaction Social Interaction: 4-Interacts appropriately 75 - 89% of the time - Needs redirection for appropriate language or to initiate interaction. Problem Solving Problem Solving: 2-Solves basic 25 - 49% of  the time - needs direction more than half the time to initiate, plan or complete simple activities Memory Memory: 3-Recognizes or recalls 50 - 74% of the time/requires cueing 25 - 49% of the time FIM - Eating Eating Activity: 5: Set-up assist for cut food  Pain Pain Assessment Pain Assessment: 0-10 Pain Score: 7  Faces Pain Scale: Hurts whole lot Pain Type: Acute pain Pain Location: Back (and hips) Pain Orientation: Lower Pain Descriptors / Indicators: Radiating Pain Frequency: Constant Pain Onset: On-going Patients Stated Pain Goal: 3 Pain Intervention(s): Medication (See eMAR)  Therapy/Group: Individual Therapy  Aunesty Tyson 05/26/2013, 3:55 PM

## 2013-05-26 NOTE — Progress Notes (Signed)
Physical Therapy Session Note  Patient Details  Name: Ann Kaufman MRN: [id number] Date of Birth: 08-06-1929  Today's Date: 05/26/2013 Time: 1130-1200 Time Calculation (min): 30 min  Short Term Goals: Week 1:  PT Short Term Goal 1 (Week 1): STGs=LTGs due to anticipated LOS  Skilled Therapeutic Interventions/Progress Updates:  1:1. Pt received supine in bed, alert but verbalizing pain in low back radiating into B LE (R>L). Pt req increased time and assist today to t/f sup>sit EOB w/ HOB elevated and use of bed rails as well as squat pivot t/f bed<>chair 2/2 pain. Pt stating that she did not think she could walk very far at all today. Pt's pain assessed w/ positional changes. Pt verbalizing most relief in seated position vs. Supine/standing as well as with B LE stretching. Pt demonstrating decreased muscle extensibility in B hamstrings. Symptoms and positions of relief consistent w/ potential lumbar stenosis. Pt repositioned in bed at end of session w/ HOB flat but B LE elevated to mimic hooklying position, which pt found to improve comfort. RN aware of pain concerns. Pt sitting in w/c at end of session w/ all needs in reach and bed alarm on.   Therapy Documentation Precautions:  Precautions Precautions: Fall Restrictions Weight Bearing Restrictions: No General:   Vital Signs:   Pain: Pain Assessment Pain Assessment: Faces Pain Score: 8  Faces Pain Scale: Hurts whole lot Pain Type: Acute pain Pain Location: Back (Radiating into B LE (R>L)) Pain Orientation: Right;Left Pain Descriptors / Indicators: Radiating Pain Frequency: Constant Pain Onset: On-going Patients Stated Pain Goal: 3 Pain Intervention(s): RN made aware;Repositioned  See FIM for current functional status  Therapy/Group: Individual Therapy  Denzil Hughes 05/26/2013, 12:04 PM

## 2013-05-26 NOTE — Progress Notes (Signed)
[facility] Individual Statement of Services  Patient Name:  Ann Kaufman  Date:  05/26/2013  Welcome to the [facility].  Our goal is to provide you with an individualized program based on your diagnosis and situation, designed to meet your specific needs.  With this comprehensive rehabilitation program, you will be expected to participate in at least 3 hours of rehabilitation therapies Monday-Friday, with modified therapy programming on the weekends.  Your rehabilitation program will include the following services:  Physical Therapy (PT), Occupational Therapy (OT), Speech Therapy (ST), 24 hour per day rehabilitation nursing, Therapeutic Recreaction (TR), Neuropsychology, Case Management (Social Worker), Rehabilitation Medicine, Nutrition Services and Pharmacy Services  Weekly team conferences will be held on Tuesdays to discuss your progress.  Your Social Worker will talk with you frequently to get your input and to update you on team discussions.  Team conferences with you and your family in attendance may also be held.  Expected length of stay: 7 days  Overall anticipated outcome:  Modified independent/ supervision  Depending on your progress and recovery, your program may change. Your Social Worker will coordinate services and will keep you informed of any changes. Your Social Worker's name and contact numbers are listed  below.  The following services may also be recommended but are not provided by the [facility]:   Driving Evaluations  Home Health Rehabiltiation Services  Outpatient Rehabilitation Services    Arrangements will be made to provide these services after discharge if needed.  Arrangements include referral to agencies that provide these services.  Your insurance has been verified to be:  Medicare and BCBS Your primary doctor is:  Dr. Elease Hashimoto  Pertinent information will be shared with your doctor and your  insurance company.  Social Worker:  [place], [place] [phone] or ([phone]   Information discussed with and copy given to patient by: Amada Jupiter, 05/26/2013, 2:15 PM

## 2013-05-26 NOTE — Progress Notes (Signed)
Social Work  Social Work Assessment and Plan  Patient Details  Name: Ann Kaufman MRN: [id number] Date of Birth: 07/18/1929  Today's Date: 05/26/2013  Problem List:  Patient Active Problem List   Diagnosis Date Noted  . Acute respiratory failure 05/19/2013  . Chronic anticoagulation 05/19/2013  . Aspiration pneumonia 05/19/2013  . ICH (intracerebral hemorrhage) 05/18/2013  . Dyspnea on exertion 04/06/2013  . Atrial fibrillation, chronic 04/06/2013  . Atherosclerosis of aorta 04/06/2013  . Hypokalemia 11/28/2011  . Heart palpitations 11/27/2011  . Pleural effusion 11/27/2011  . Atrial fibrillation with RVR 11/27/2011  . HTN (hypertension) 11/27/2011   Past Medical History:  Past Medical History  Diagnosis Date  . Intracerebral hemorrhage     a. 04/2013 in setting of xarelto therapy.  . Atrial fibrillation     a. Dx in 2012;  b. Rhytm controlled - amiodarone, previously anticoagulated with xarelto (d/c'd 04/2013 in setting of ICH);  b. 04/2013 Echo: EF 55-60%, Gr 1 DD, mild MR, mod dil LA, PAsP .  . Hypertension   . Depression   . Arthritis   . Presence of permanent cardiac pacemaker     a. 2012 MDT, placed in [place] (probable tachy-brady).  . Pacemaker    Past Surgical History:  Past Surgical History  Procedure Laterality Date  . Pacemaker insertion    . Abdominal hysterectomy    . Breast lumpectomy    . Appendectomy    . Bowel resection     Social History:  reports that she quit smoking about 44 years ago. She does not have any smokeless tobacco history on file. She reports that she does not drink alcohol or use illicit drugs.  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 plan to arrange 24/7 supervsion if needed.  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  Pt does not appear in any emotional distress - will monitor.  Do feel that neuropsych consult would be beneficial in determining extent of cognitive impairments. Recent Psychosocial Issues: None Pyschiatric History: Pt's denies any psych hx.  Daughter reports that she feel very strongly that pt suffered from depression in the past and notes her primary MD had placed her on meds, however, pt refused to take them.  No formal treatment. Substance Abuse History: None  Patient / Family Perceptions, Expectations & Goals Pt/Family understanding of illness & functional limitations: pt oriented to self only upon first interview.  Uncertain reason for hospitalization when asked.  daughter with good understanding of medical issues, however, concerned with perceived decline in cognition upon transfer to CIR Premorbid pt/family roles/activities: living independently and volunteering 2-3  days per week Anticipated changes in roles/activities/participation: pt may require 24/7 supervision if little improvement in cognition Pt/family expectations/goals: "I want to go home"  [employer]: None Premorbid Home Care/DME Agencies: None Transportation available at discharge: yes Resource referrals recommended: Neuropsychology;Support group (specify)  Discharge Planning Living Arrangements: Alone Support Systems: Children Type of Residence: Private residence Insurance Resources: Administrator (specify) Biomedical engineer) Financial Resources: Restaurant manager, fast food Screen Referred: No Living Expenses: Psychologist, sport and exercise Management: Patient Does the patient have any problems obtaining your medications?: No Home Management: pt Patient/Family Preliminary Plans: plans still under discussion - considering hiring private duty care unless improved discussion Barriers to Discharge: Self care Social Work Anticipated Follow Up Needs: HH/OP;Support Group Expected length of stay: 1 week  Clinical Impression Pleasant, talkative woman with obvious cognitive and speech impairment.  Daughter very supportive, however, unable to provide 24/7 care as she works and lives out of town.  To consider options for 24/7 supervision for pt.  Follow for d/c planning needs.  Stanton Kissoon 05/26/2013, 2:13 PM

## 2013-05-26 NOTE — Progress Notes (Signed)
Recreational Therapy Assessment and Plan  Patient Details  Name: Ann Kaufman MRN: [id number] Date of Birth: July 01, 1929 Today's Date: 05/26/2013  Rehab Potential: Good ELOS: 10 days   Assessment Clinical Impression: Problem List:  Patient Active Problem List    Diagnosis  Date Noted   .  Acute respiratory failure  05/19/2013   .  Chronic anticoagulation  05/19/2013   .  Aspiration pneumonia  05/19/2013   .  ICH (intracerebral hemorrhage)  05/18/2013   .  Dyspnea on exertion  04/06/2013   .  Atrial fibrillation, chronic  04/06/2013   .  Atherosclerosis of aorta  04/06/2013   .  Hypokalemia  11/28/2011   .  Heart palpitations  11/27/2011   .  Pleural effusion  11/27/2011   .  Atrial fibrillation with RVR  11/27/2011   .  HTN (hypertension)  11/27/2011    Past Medical History:  Past Medical History   Diagnosis  Date   .  Intracerebral hemorrhage      a. 04/2013 in setting of xarelto therapy.   .  Atrial fibrillation      a. Dx in 2012; b. Rhytm controlled - amiodarone, previously anticoagulated with xarelto (d/c'd 04/2013 in setting of ICH); b. 04/2013 Echo: EF 55-60%, Gr 1 DD, mild MR, mod dil LA, PAsP .   .  Hypertension    .  Depression    .  Arthritis    .  Presence of permanent cardiac pacemaker      a. 2012 MDT, placed in [place] (probable tachy-brady).   .  Pacemaker     Past Surgical History:  Past Surgical History   Procedure  Laterality  Date   .  Pacemaker insertion     .  Abdominal hysterectomy     .  Breast lumpectomy     .  Appendectomy     .  Bowel resection      Assessment & Plan  Clinical Impression: Ann Kaufman is a 79 y.o. female with history of HTN, A fib-on xarelto, PPM; who was admitted on 05/18/13 with speech difficulty, right facial droop and seizure. Treated with ativan and loaded with keppra. Patient intubated for airway protection and CT head with Left frontal lobe intraparenchymal hematoma with intraventricular hemorrhage within  the left lateral ventricle. Patient in acute respiratory distress due to pulmonary edema v/s ALI/ARDS. Empiric antibiotics and BD ordered. Neurology consulted and anticoagulation reversed per FEIBA protocol. Follow up CCT 11/27 with left frontal hematoma and vasogenic edema. 2D echo with EF 55-60% and possible small, oscillating density (? Thrombus; ?vegetation)associated with RA wire vs chiari network. Extubated without difficulty and swallow evaluation without evidence of dysphagia. Therapies initiated and patient with subsequent high level cognitive deficits with decreased safety as well as left visual field impairments. Therapy team recommended CIR and patient admitted today. Patient transferred to CIR on 05/24/2013.  Pt presents with decreased activity tolerance, decreased functional mobility, decreased balance, decreased safety, decreased problem solving, decreased memory, decreased awareness Limiting pt's independence with leisure/community pursuits.  Leisure History/Participation Premorbid leisure interest/current participation: Garment/textile technologist - Journalist, newspaper - Press photographer - Agricultural consultant (Comment) Other Leisure Interests: Television;Reading;Cooking/Baking Leisure Participation Style: With Family/Friends Awareness of Community Resources: Good-identify 3 post discharge leisure resources Psychosocial / Spiritual Social interaction - Mood/Behavior: Cooperative Firefighter Appropriate for Education?: Yes Recreational Therapy Orientation Orientation -Reviewed with patient: Available activity resources Strengths/Weaknesses Patient Strengths/Abilities: Willingness to participate;Active premorbidly Patient weaknesses: Physical limitations TR Patient demonstrates impairments in the  following area(s): Endurance;Motor;Pain;Safety  Plan Rec Therapy Plan Is patient appropriate for Therapeutic Recreation?: Yes Rehab Potential: Good Treatment times per week: Min 1 time per  week >20 minutes Estimated Length of Stay: 10 days TR Treatment/Interventions: Adaptive equipment instruction;1:1 session;Balance/vestibular training;Functional mobility training;Community reintegration;Cognitive remediation/compensation;Patient/family education;Therapeutic activities;Recreation/leisure participation;Therapeutic exercise  Recommendations for other services: None  Discharge Criteria: Patient will be discharged from TR if patient refuses treatment 3 consecutive times without medical reason.  If treatment goals not met, if there is a change in medical status, if patient makes no progress towards goals or if patient is discharged from hospital.  The above assessment, treatment plan, treatment alternatives and goals were discussed and mutually agreed upon: by patient  Ann Kaufman 05/26/2013, 4:24 PM

## 2013-05-26 NOTE — Progress Notes (Signed)
Occupational Therapy Session Note  Patient Details  Name: Ann Kaufman MRN: [id number] Date of Birth: [birth year]  Today's Date: 05/26/2013 Time: 1478-2956 Time Calculation (min): 45 min  Short Term Goals: Week 1:  OT Short Term Goal 1 (Week 1): STG=LTG  Skilled Therapeutic Interventions/Progress Updates:    1:1 Co treat with Rec therapy with focus on simple meal prep of making cookies. Pt required max A to sequence tasks from written directions. Pt with notable errors in language but demonstrated decr awareness requiring max A to self correct. Pt with constant pain in right back of her LE/ buttock region. RN made aware. Pt with unsafe functional ambulation compared to evaluation yesterday. Pt required mod A to remain safe with right LE giving way at times. Pt remained seated in kitchen to perform task due to pain.   Therapy Documentation Precautions:  Precautions Precautions: Fall Restrictions Weight Bearing Restrictions: No General:   Vital Signs:   Pain: Pain Assessment Pain Assessment: 0-10 Pain Score: 10-Worst pain ever Faces Pain Scale: Hurts whole lot Pain Type: Acute pain Pain Location: Back (and hips) Pain Orientation: Lower Pain Descriptors / Indicators: Radiating Pain Frequency: Constant Pain Onset: On-going Patients Stated Pain Goal: 3 Pain Intervention(s): Medication (See eMAR)  See FIM for current functional status  Therapy/Group: Individual Therapy  Roney Mans [facility] 05/26/2013, 2:09 PM

## 2013-05-26 NOTE — Progress Notes (Signed)
Subjective/Complaints: Had a better night. In fairly good spirits this morning.  A 12 point review of systems has been performed and if not noted above is otherwise negative.   Objective: Vital Signs: Blood pressure 124/67, pulse 72, temperature 97.3 F (36.3 C), temperature source Oral, resp. rate 18, weight 74.6 kg (164 lb 7.4 oz), SpO2 92.00%. Dg Chest 2 View  05/26/2013   CLINICAL DATA:  Followup infiltrates  EXAM: CHEST  2 VIEW  COMPARISON:  05/25/2013  FINDINGS: Improving bilateral airspace disease consistent with resolving edema or pneumonia. Small bilateral pleural effusions are present.  Underlying COPD.  Dual lead pacemaker unchanged.  IMPRESSION: COPD with improving diffuse bilateral airspace disease most consistent with resolving edema.   Electronically Signed   By: Marlan Palau M.D.   On: 05/26/2013 08:14   Dg Chest 2 View  05/25/2013   CLINICAL DATA:  Evaluate for pneumonia  EXAM: CHEST  2 VIEW  COMPARISON:  DG CHEST 1V PORT dated 05/20/2013; CT ANGIO CHEST W/CM &/OR WO/CM dated 11/26/2011; DG CHEST 1V PORT dated 05/19/2013; DG CHEST 1V PORT dated 05/18/2013  FINDINGS: Left-sided pacemaker overlies normal cardiac silhouette. There is increased opacity in the right upper lobe compared to most recent prior. There is chronic peribronchial markings similar prior. The small left effusion is noted.  IMPRESSION: 1. Increased opacity in the right upper lobe is concerning for pneumonia or asymmetric edema. 2. Small left effusion is similar. 3. Perihilar chronic bronchitic change.   Electronically Signed   By: Genevive Bi M.D.   On: 05/25/2013 09:45   Dg Lumbar Spine 2-3 Views  05/25/2013   CLINICAL DATA:  Low back pain  EXAM: LUMBAR SPINE - 2-3 VIEW  COMPARISON:  None.  FINDINGS: Normal alignment of the lumbar vertebral bodies. There is bulky osteophytosis at T12-L1. No evidence of subluxation.  IMPRESSION: 1. No acute findings lumbar spine. 2. Multilevel disc osteophytic disease most  prominent from the T12 to L2. Marland Kitchen   Electronically Signed   By: Genevive Bi M.D.   On: 05/25/2013 09:51    Recent Labs  05/25/13 0524  WBC 7.1  HGB 11.5*  HCT 35.4*  PLT 187    Recent Labs  05/25/13 0524  NA 136  K 3.5  CL 102  GLUCOSE 105*  BUN 13  CREATININE 0.72  CALCIUM 8.4   CBG (last 3)  No results found for this basename: GLUCAP,  in the last 72 hours  Wt Readings from Last 3 Encounters:  05/26/13 74.6 kg (164 lb 7.4 oz)  05/24/13 78.291 kg (172 lb 9.6 oz)  04/06/13 74.481 kg (164 lb 3.2 oz)    Physical Exam:  Constitutional: She is oriented to person, place, and time. She appears well-developed and well-nourished.  HENT:  Head: Normocephalic and atraumatic.  Eyes: Conjunctivae and EOM are normal. Pupils are equal, round, and reactive to light. Right eye exhibits no discharge. Left eye exhibits no discharge. No scleral icterus.  Neck: Normal range of motion. Neck supple.  Cardiovascular: Normal rate and regular rhythm. Exam reveals no gallop and no friction rub.  No murmur heard.  Respiratory: Effort normal. No respiratory distress. She has no wheezes.  GI: Soft. Bowel sounds are normal. She exhibits no distension. There is no tenderness.  Musculoskeletal: She exhibits no edema. Tenderness with palpation of the lower lumbar spine and ito bilateral buttocks, appeared less intense Neurological: She is drowsy, distracted, confused. Limited insight and awareness Right facial weakness. Speech with mild dysarthria which has  improved somewht from yesterday .Follows two step commands without difficulty. Mild right sided weakness, grossly 4/5 RUE prox to distal. RLE is 4-HF to 4/5 at ankle. Decreased FTN with right hand. No gross sensory changes.  Skin: Skin is warm and dry.  A few limb bruises noted  Psychiatric: restless and distracted   Assessment/Plan: 1. Functional deficits secondary to left frontal hematoma which require 3+ hours per day of interdisciplinary  therapy in a comprehensive inpatient rehab setting. Physiatrist is providing close team supervision and 24 hour management of active medical problems listed below. Physiatrist and rehab team continue to assess barriers to discharge/monitor patient progress toward functional and medical goals. FIM: FIM - Bathing Bathing Steps Patient Completed: Chest;Right Arm;Left Arm;Abdomen;Front perineal area;Buttocks;Right upper leg;Left upper leg;Right lower leg (including foot);Left lower leg (including foot) Bathing: 4: Steadying assist  FIM - Upper Body Dressing/Undressing Upper body dressing/undressing steps patient completed: Thread/unthread right bra strap;Thread/unthread left bra strap;Hook/unhook bra;Thread/unthread right sleeve of pullover shirt/dresss;Thread/unthread left sleeve of pullover shirt/dress;Put head through opening of pull over shirt/dress;Pull shirt over trunk Upper body dressing/undressing: 5: Supervision: Safety issues/verbal cues FIM - Lower Body Dressing/Undressing Lower body dressing/undressing steps patient completed: Thread/unthread right underwear leg;Thread/unthread left underwear leg;Pull underwear up/down;Thread/unthread right pants leg;Thread/unthread left pants leg;Pull pants up/down;Don/Doff right sock;Don/Doff left sock Lower body dressing/undressing: 4: Steadying Assist  FIM - Toileting Toileting steps completed by patient: Adjust clothing prior to toileting;Performs perineal hygiene;Adjust clothing after toileting Toileting: 5: Supervision: Safety issues/verbal cues  FIM - Toilet Transfers Toilet Transfers: 5-To toilet/BSC: Supervision (verbal cues/safety issues);5-From toilet/BSC: Supervision (verbal cues/safety issues)  FIM - Bed/Chair Transfer Bed/Chair Transfer: 5: Bed > Chair or W/C: Supervision (verbal cues/safety issues);5: Chair or W/C > Bed: Supervision (verbal cues/safety issues);5: Supine > Sit: Supervision (verbal cues/safety issues);5: Sit > Supine:  Supervision (verbal cues/safety issues)  FIM - Locomotion: Wheelchair Locomotion: Wheelchair: 0: Activity did not occur (Pt consistently ambulating throughout tx ) FIM - Locomotion: Ambulation Ambulation/Gait Assistance: 4: Min guard;4: Min assist Locomotion: Ambulation: 4: Travels 150 ft or more with minimal assistance (Pt.>75%)  Comprehension Comprehension Mode: Auditory Comprehension: 3-Understands basic 50 - 74% of the time/requires cueing 25 - 50%  of the time  Expression Expression Mode: Verbal Expression: 3-Expresses basic 50 - 74% of the time/requires cueing 25 - 50% of the time. Needs to repeat parts of sentences.  Social Interaction Social Interaction: 3-Interacts appropriately 50 - 74% of the time - May be physically or verbally inappropriate.  Problem Solving Problem Solving: 4-Solves basic 75 - 89% of the time/requires cueing 10 - 24% of the time  Memory Memory: 4-Recognizes or recalls 75 - 89% of the time/requires cueing 10 - 24% of the time  Medical Problem List and Plan:  ICH with seizure activity--repeat CCT in about a week to decide on ASA resumption.  1. DVT Prophylaxis/Anticoagulation: Pharmaceutical: Lovenox  2. Pain Management: Continue tylenol prn for headaches. Add muscle relaxers for exacerbation of chronic back pain  -lumbar xrays show spondylosis, no fx or substantial ddd  -kpad for back. Need to limit narcs to avoid excessive confusion   -lidoderm patches for low back qhs prn 3. H/o depression/Mood: team egosuport, sw follow up  4. Neuropsych: This patient is not capable of making decisions on her own behalf at this moment.  5. A fib with PPM: Continue amiodarone and metoprolol. No anticoagulation at this time and f/u with cards in [place].  7. New onset seizure: on keppra bid 8. Aspiration PNA: continue levaquin, cxr better  9. Confusion: labs unremarkable so far.    -recheck cxr improved  -limit neurosedating meds  LOS (Days) 2 A FACE TO FACE  EVALUATION WAS PERFORMED  SWARTZ,ZACHARY T 05/26/2013 8:29 AM

## 2013-05-27 ENCOUNTER — Inpatient Hospital Stay (HOSPITAL_COMMUNITY): Payer: Medicare Other

## 2013-05-27 ENCOUNTER — Other Ambulatory Visit: Payer: Self-pay

## 2013-05-27 ENCOUNTER — Inpatient Hospital Stay (HOSPITAL_COMMUNITY): Payer: Medicare Other | Admitting: Speech Pathology

## 2013-05-27 DIAGNOSIS — I4891 Unspecified atrial fibrillation: Secondary | ICD-10-CM

## 2013-05-27 DIAGNOSIS — I619 Nontraumatic intracerebral hemorrhage, unspecified: Secondary | ICD-10-CM

## 2013-05-27 DIAGNOSIS — I10 Essential (primary) hypertension: Secondary | ICD-10-CM

## 2013-05-27 DIAGNOSIS — J159 Unspecified bacterial pneumonia: Secondary | ICD-10-CM

## 2013-05-27 LAB — URINALYSIS, ROUTINE W REFLEX MICROSCOPIC
Glucose, UA: NEGATIVE mg/dL
Ketones, ur: NEGATIVE mg/dL
Leukocytes, UA: NEGATIVE
Nitrite: NEGATIVE
Protein, ur: NEGATIVE mg/dL
Urobilinogen, UA: 0.2 mg/dL (ref 0.0–1.0)

## 2013-05-27 MED ORDER — SENNOSIDES-DOCUSATE SODIUM 8.6-50 MG PO TABS
2.0000 | ORAL_TABLET | Freq: Every day | ORAL | Status: DC
  Administered 2013-05-27 – 2013-06-07 (×9): 2 via ORAL
  Filled 2013-05-27 (×12): qty 2

## 2013-05-27 MED ORDER — QUETIAPINE FUMARATE 25 MG PO TABS
25.0000 mg | ORAL_TABLET | Freq: Every day | ORAL | Status: DC
  Filled 2013-05-27: qty 1

## 2013-05-27 MED ORDER — LORAZEPAM 2 MG/ML IJ SOLN
1.0000 mg | Freq: Once | INTRAMUSCULAR | Status: AC
  Administered 2013-05-27: 1 mg via INTRAMUSCULAR

## 2013-05-27 MED ORDER — LORAZEPAM 2 MG/ML IJ SOLN
INTRAMUSCULAR | Status: AC
  Filled 2013-05-27: qty 1

## 2013-05-27 MED ORDER — QUETIAPINE FUMARATE 25 MG PO TABS
25.0000 mg | ORAL_TABLET | Freq: Every day | ORAL | Status: DC
  Filled 2013-05-27 (×2): qty 1

## 2013-05-27 MED ORDER — SODIUM CHLORIDE 0.9 % IV SOLN
INTRAVENOUS | Status: DC
  Administered 2013-05-28: 01:00:00 via INTRAVENOUS

## 2013-05-27 MED ORDER — LORAZEPAM 1 MG PO TABS: 1.0000 mg | ORAL_TABLET | Freq: Once | ORAL | Status: AC

## 2013-05-27 MED ORDER — QUETIAPINE FUMARATE 50 MG PO TABS
50.0000 mg | ORAL_TABLET | Freq: Every day | ORAL | Status: DC
  Administered 2013-05-27 – 2013-05-28 (×2): 50 mg via ORAL
  Filled 2013-05-27 (×3): qty 1

## 2013-05-27 NOTE — IPOC Note (Signed)
Overall Plan of Care [facility]) Patient Details Name: Ann Kaufman MRN: [id number] DOB: March 22, 1930  Admitting Diagnosis: L FRONTAL ICH  Hospital Problems: Active Problems:   ICH (intracerebral hemorrhage)     Functional Problem List: Nursing Pain;Behavior  PT Balance;Endurance;Safety  OT Balance;Cognition;Endurance;Motor;Pain;Safety  SLP Cognition  TR Endurance;Motor;Pain;Safety       Basic ADL's: OT Grooming;Bathing;Dressing;Toileting     Advanced  ADL's: OT Simple Meal Preparation     Transfers: PT Bed Mobility;Bed to Chair;Car;Furniture;Floor  OT Toilet;Tub/Shower     Locomotion: PT Ambulation;Stairs     Additional Impairments: OT None  SLP Communication;Social Cognition expression;comprehension Problem Solving;Memory;Awareness  TR      Anticipated Outcomes Item Anticipated Outcome  Self Feeding mod I   Swallowing      Basic self-care  mod I   Toileting  mod I    Bathroom Transfers mod I   Bowel/Bladder  Patient will remain continent of bowel and bladder  Transfers  Mod(I)  Locomotion  Mod(I)  Communication  Supervision  Cognition  Supervision  Pain  Pain manage at or below 3  Safety/Judgment  Min Assist   Therapy Plan: PT Intensity: Minimum of 1-2 x/day ,45 to 90 minutes PT Frequency: 5 out of 7 days PT Duration Estimated Length of Stay: 5-7days OT Intensity: Minimum of 1-2 x/day, 45 to 90 minutes OT Frequency: 5 out of 7 days OT Duration/Estimated Length of Stay: 5 days SLP Intensity: Minumum of 1-2 x/day, 30 to 90 minutes SLP Frequency: 5 out of 7 days SLP Duration/Estimated Length of Stay: 5-7 days       Team Interventions: Nursing Interventions Patient/Family Education;Pain Management  PT interventions Ambulation/gait training;Balance/vestibular training;Discharge planning;DME/adaptive equipment instruction;Functional mobility training;Pain management;Therapeutic Activities;UE/LE Strength taining/ROM;UE/LE Coordination  activities;Therapeutic Exercise;Stair training;Patient/family education;Neuromuscular re-education;Disease management/prevention;Community reintegration;Cognitive remediation/compensation  OT Interventions Balance/vestibular training;Cognitive remediation/compensation;Discharge planning;Community reintegration;DME/adaptive equipment instruction;Functional mobility training;Pain management;Patient/family education;Self Care/advanced ADL retraining;Therapeutic Exercise;UE/LE Coordination activities;UE/LE Strength taining/ROM;Therapeutic Activities;Skin care/wound managment  SLP Interventions Cueing hierarchy;Cognitive remediation/compensation;Environmental controls;Internal/external aids;Speech/Language facilitation;Patient/family education;Therapeutic Activities;Functional tasks  TR Interventions Adaptive equipment instruction;1:1 session;Balance/vestibular training;Functional mobility training;Community reintegration;Cognitive remediation/compensation;Patient/family education;Therapeutic activities;Recreation/leisure participation;Therapeutic exercise  SW/CM Interventions Discharge Planning;Psychosocial Support;Patient/Family Education    Team Discharge Planning: Destination: PT-Home ,OT- Home , SLP-Home Projected Follow-up: PT-Outpatient PT;Home health PT;24 hour supervision/assistance (24 hr supervision 2/2 cognitive impairments), OT-  Home health OT, SLP-24 hour supervision/assistance;Outpatient SLP Projected Equipment Needs: PT-To be determined, OT- To be determined, SLP-None recommended by SLP Equipment Details: PT- , OT-  Patient/family involved in discharge planning: PT- Patient,  OT-Patient, SLP-Patient  MD ELOS: 7 days Medical Rehab Prognosis:  Good Assessment: The patient has been admitted for CIR therapies. The team will be addressing, functional mobility, strength, stamina, balance, safety, adaptive techniques/equipment, self-care, bowel and bladder mgt, patient and caregiver education,  behavior, sleep wake cycle, cognition, language. Goals have been set at mod I with basic mobility and self-care and supervision with cognition and language.   Ranelle Oyster, MD, FAAPMR     See Team Conference Notes for weekly updates to the plan of care

## 2013-05-27 NOTE — Progress Notes (Addendum)
Physical Therapy Session Note  Patient Details  Name: Ann Kaufman MRN: [id number] Date of Birth: 31-Jul-1929  Today's Date: 05/27/2013 Time: 1000-1100 Time Calculation (min): 60 min  Short Term Goals: Week 1:  PT Short Term Goal 1 (Week 1): STGs=LTGs due to anticipated LOS  Skilled Therapeutic Interventions/Progress Updates:  1:1. Pt received sitting in recliner at nurses station, verbalization of high level of pain but agreeable to therapy. During t/f recliner<>w/c, pt demonstrating increased pain w/ erect posture during attempted SPT. Pt able to perform squat pivot t/f reclier<>w/c<>tx mat and w/c<>NuStep this session w/ minimal elevation of pain and min A overall, however, req mod A for t/f sup<>sit on tx mat. Pt able to propel w/c 150' this session w/ min-mod A w/ B UE/BLE. Focus this session on stretching/strengthing of back/B LE for muscle extensibility and pain management w/ B LE biased in flexed positioning for decreased pain. Use of green therapy ball to flex B hips/knees to 90/90 for rhythmic relaxation, stretching and flexion/extension of B LE. Therapist further stretching B hips/knees all planes w/ use of contract-relax technique for hamstrings. Pt w/ fair tolerance to NuStep for endurance and rhythmic stretching, level 2x23min w/ 1 rest break. Pt demonstrated increased language of confusion at end of session vs. Start of session. Pt sitting in recliner in room at end of session w/ quick release belt in place and all needs in reach and friend in room. Friend verbalized understanding to notify nurse when leaving as pt req supervision when up for safety. RN also aware of pt's location, friend in room as well as increased confusion.   Therapy Documentation Precautions:  Precautions Precautions: Fall Restrictions Weight Bearing Restrictions: No Pain: Pain Assessment Pain Assessment: Faces Pain Score: 7  Faces Pain Scale: Hurts whole lot Pain Type: Acute pain Pain Location:  Back Pain Orientation: Right;Left Pain Radiating Towards: hip Pain Descriptors / Indicators: Aching Pain Onset: On-going Pain Intervention(s): Repositioned;Distraction;RN made aware  See FIM for current functional status  Therapy/Group: Individual Therapy  Denzil Hughes 05/27/2013, 12:21 PM

## 2013-05-27 NOTE — Progress Notes (Signed)
Nursing Note: Pt slept most of the night in recliner but restless and irritable. Awake and required 1:1 for safety.wbb

## 2013-05-27 NOTE — Progress Notes (Signed)
Speech Language Pathology Daily Session Note  Patient Details  Name: Ann Kaufman MRN: [id number] Date of Birth: [birth year]  Today's Date: 05/27/2013 Time: 7829-5621 Time Calculation (min): 45 min  Short Term Goals: Week 1: SLP Short Term Goal 1 (Week 1): Pt will demonstrate functional problem solving for mildly complex tasks with Min A verbal and visual cues. SLP Short Term Goal 2 (Week 1): Pt will utilize external memory aids to recall new, daily information with supervision verbal cues.  SLP Short Term Goal 3 (Week 1): Pt will self-monitor and correct verbal errors with supervision verbal and question cues.  SLP Short Term Goal 4 (Week 1): Pt will demonstrate appropriate safety awareness and request assistance with supervision question cues.   Skilled Therapeutic Interventions: Treatment focus on cognitive-linguistic goals. SLP facilitated session by providing Max A question and verbal cues for auditory comprehension for rules of task. Pt demonstrated language of confusion/jargon throughout the session and required total A to self-monitor and correct errors. Pt continues to show signs of pain in leg/back area and was restless in recliner. Continue plan of care.    FIM:  Comprehension Comprehension Mode: Auditory Comprehension: 3-Understands basic 50 - 74% of the time/requires cueing 25 - 50%  of the time Expression Expression Mode: Verbal Expression: 3-Expresses basic 50 - 74% of the time/requires cueing 25 - 50% of the time. Needs to repeat parts of sentences. Social Interaction Social Interaction: 4-Interacts appropriately 75 - 89% of the time - Needs redirection for appropriate language or to initiate interaction. Problem Solving Problem Solving: 2-Solves basic 25 - 49% of the time - needs direction more than half the time to initiate, plan or complete simple activities Memory Memory: 2-Recognizes or recalls 25 - 49% of the time/requires cueing 51 - 75% of the time FIM -  Eating Eating Activity: 5: Set-up assist for open containers;5: Set-up assist for cut food  Pain Pain Assessment Pain Assessment: Faces Faces Pain Scale: Hurts whole lot Pain Location: Back Pain Orientation: Right;Left Pain Radiating Towards: hip Pain Descriptors / Indicators: Aching Patients Stated Pain Goal: 4 Pain Intervention(s): Repositioned;Distraction;RN made aware  Therapy/Group: Individual Therapy  Beya Tipps 05/27/2013, 12:11 PM

## 2013-05-27 NOTE — Progress Notes (Signed)
Nursing Note: Pt confused and will not stay in bed. Pt up to desk in recliner for 1;1.Pt asking for her mother and wanting to leave. Pt hitting stay away and paranoid.Pt denies pain.Trazadone given.wbb

## 2013-05-27 NOTE — Progress Notes (Signed)
Physical Therapy Session Note  Patient Details  Name: Ann Kaufman MRN: [id number] Date of Birth: [birth year]  Today's Date: 05/27/2013  Skilled Therapeutic Interventions/Progress Updates:  Pt missed 30 minutes of scheduled skilled physical therapy due to deep sleep following sustained period of agitation. Pt sleeping in recliner with sitter in room.  Therapy Documentation Precautions:  Precautions Precautions: Fall Restrictions Weight Bearing Restrictions: No General: Amount of Missed PT Time (min): 30 Minutes Missed Time Reason: Patient fatigue  Denzil Hughes 05/27/2013, 2:08 PM

## 2013-05-27 NOTE — Progress Notes (Signed)
Called to room by sitter; pt had gotten up from chair and walked to bathroom; locked herself in and before NT could get door unlocked and opened, pt had falled into the floor and when the door was opened, pt note on her knees pulling self up from floor with grab bars. No injury noted. Pam Love notified of incident and DD to room to check on pt.Pt assisted back to chair and assessed for injury. Noted pain as before in rt hip and low back area. No new orders received. [name]

## 2013-05-27 NOTE — Progress Notes (Signed)
Occupational Therapy Session Note  Patient Details  Name: Ann Kaufman MRN: [id number] Date of Birth: 08-31-1929  Today's Date: 05/27/2013 Time: 0700-0755 Time Calculation (min): 55 min  Short Term Goals: Week 1:  OT Short Term Goal 1 (Week 1): STG=LTG  Skilled Therapeutic Interventions/Progress Updates:    Pt sitting in recliner at nurses station upon arrival.  RN reported that patient did not sleep much last night and was restless throughout the night.  Pt returned to room for bathing and dressing.  Pt stated that she didn't want to shower this morning and would just wash up at sink.  Pt continues to experience increased pain in right lower back radiating into RLE when standing and transferring. Pt not oriented to place or date this morning and frequently made incorrect references to [place] and [place] during conversation. Pt required mod verbal cues to attend to task and for redirection.  Pt required mod verbal cues for safety awareness.  Focus on safety awareness, transfers, standing balance, orientation, and activity tolerance. Pt returned to nurses station with QRB in place to eat breakfast.  Therapy Documentation Precautions:  Precautions Precautions: Fall Restrictions Weight Bearing Restrictions: No Pain: Pain Assessment Pain Assessment: 0-10 Pain Score: 7  Pain Type: Acute pain Pain Location: Back Pain Orientation: Right;Lower Pain Descriptors / Indicators: Radiating Pain Onset: On-going Pain Intervention(s): RN made aware;Repositioned  See FIM for current functional status  Therapy/Group: Individual Therapy  Rich Brave 05/27/2013, 7:58 AM

## 2013-05-27 NOTE — Progress Notes (Signed)
Subjective/Complaints: Awake. Modest complaints of pain today. Slept most of night per rn in recliner, restless A 12 point review of systems has been performed and if not noted above is otherwise negative.   Objective: Vital Signs: Blood pressure 136/71, pulse 66, temperature 97.9 F (36.6 C), temperature source Oral, resp. rate 18, weight 74.6 kg (164 lb 7.4 oz), SpO2 95.00%. Dg Chest 2 View  05/26/2013   CLINICAL DATA:  Followup infiltrates  EXAM: CHEST  2 VIEW  COMPARISON:  05/25/2013  FINDINGS: Improving bilateral airspace disease consistent with resolving edema or pneumonia. Small bilateral pleural effusions are present.  Underlying COPD.  Dual lead pacemaker unchanged.  IMPRESSION: COPD with improving diffuse bilateral airspace disease most consistent with resolving edema.   Electronically Signed   By: Marlan Palau M.D.   On: 05/26/2013 08:14   Dg Chest 2 View  05/25/2013   CLINICAL DATA:  Evaluate for pneumonia  EXAM: CHEST  2 VIEW  COMPARISON:  DG CHEST 1V PORT dated 05/20/2013; CT ANGIO CHEST W/CM &/OR WO/CM dated 11/26/2011; DG CHEST 1V PORT dated 05/19/2013; DG CHEST 1V PORT dated 05/18/2013  FINDINGS: Left-sided pacemaker overlies normal cardiac silhouette. There is increased opacity in the right upper lobe compared to most recent prior. There is chronic peribronchial markings similar prior. The small left effusion is noted.  IMPRESSION: 1. Increased opacity in the right upper lobe is concerning for pneumonia or asymmetric edema. 2. Small left effusion is similar. 3. Perihilar chronic bronchitic change.   Electronically Signed   By: Genevive Bi M.D.   On: 05/25/2013 09:45   Dg Lumbar Spine 2-3 Views  05/25/2013   CLINICAL DATA:  Low back pain  EXAM: LUMBAR SPINE - 2-3 VIEW  COMPARISON:  None.  FINDINGS: Normal alignment of the lumbar vertebral bodies. There is bulky osteophytosis at T12-L1. No evidence of subluxation.  IMPRESSION: 1. No acute findings lumbar spine. 2. Multilevel  disc osteophytic disease most prominent from the T12 to L2. Marland Kitchen   Electronically Signed   By: Genevive Bi M.D.   On: 05/25/2013 09:51    Recent Labs  05/25/13 0524  WBC 7.1  HGB 11.5*  HCT 35.4*  PLT 187    Recent Labs  05/25/13 0524  NA 136  K 3.5  CL 102  GLUCOSE 105*  BUN 13  CREATININE 0.72  CALCIUM 8.4   CBG (last 3)  No results found for this basename: GLUCAP,  in the last 72 hours  Wt Readings from Last 3 Encounters:  05/26/13 74.6 kg (164 lb 7.4 oz)  05/24/13 78.291 kg (172 lb 9.6 oz)  04/06/13 74.481 kg (164 lb 3.2 oz)    Physical Exam:  Constitutional: She is oriented to person, place, and time. She appears well-developed and well-nourished.  HENT:  Head: Normocephalic and atraumatic.  Eyes: Conjunctivae and EOM are normal. Pupils are equal, round, and reactive to light. Right eye exhibits no discharge. Left eye exhibits no discharge. No scleral icterus.  Neck: Normal range of motion. Neck supple.  Cardiovascular: Normal rate and regular rhythm. Exam reveals no gallop and no friction rub.  No murmur heard.  Respiratory: Effort normal. No respiratory distress. She has no wheezes.  GI: Soft. Bowel sounds are normal. She exhibits no distension. There is no tenderness.  Musculoskeletal: She exhibits no edema. Tenderness with palpation of the lower lumbar spine and ito bilateral buttocks, appeared less intense Neurological: She is drowsy, distracted, confused. Limited insight and awareness Right facial weakness. Speech with  mild dysarthria which has improved somewht from yesterday .Follows two step commands without difficulty. Mild right sided weakness, grossly 4/5 RUE prox to distal. RLE is 4-HF to 4/5 at ankle. Decreased FTN with right hand. No gross sensory changes.  Skin: Skin is warm and dry.  A few limb bruises noted  Psychiatric: restless and distracted   Assessment/Plan: 1. Functional deficits secondary to left frontal hematoma which require 3+ hours  per day of interdisciplinary therapy in a comprehensive inpatient rehab setting. Physiatrist is providing close team supervision and 24 hour management of active medical problems listed below. Physiatrist and rehab team continue to assess barriers to discharge/monitor patient progress toward functional and medical goals. FIM: FIM - Bathing Bathing Steps Patient Completed: Chest;Right Arm;Left Arm;Abdomen;Front perineal area;Buttocks;Right upper leg;Left upper leg;Right lower leg (including foot);Left lower leg (including foot) Bathing: 4: Steadying assist  FIM - Upper Body Dressing/Undressing Upper body dressing/undressing steps patient completed: Thread/unthread right bra strap;Thread/unthread left bra strap;Hook/unhook bra;Thread/unthread right sleeve of pullover shirt/dresss;Thread/unthread left sleeve of pullover shirt/dress;Put head through opening of pull over shirt/dress;Pull shirt over trunk Upper body dressing/undressing: 5: Supervision: Safety issues/verbal cues FIM - Lower Body Dressing/Undressing Lower body dressing/undressing steps patient completed: Thread/unthread right underwear leg;Thread/unthread left underwear leg;Pull underwear up/down;Thread/unthread right pants leg;Thread/unthread left pants leg;Pull pants up/down;Don/Doff right sock;Don/Doff left sock Lower body dressing/undressing: 4: Min-Patient completed 75 plus % of tasks  FIM - Toileting Toileting steps completed by patient: Adjust clothing prior to toileting;Performs perineal hygiene;Adjust clothing after toileting Toileting: 4: Steadying assist  FIM - Toilet Transfers Toilet Transfers: 3-To toilet/BSC: Mod A (lift or lower assist);3-From toilet/BSC: Mod A (lift or lower assist)  FIM - Bed/Chair Transfer Bed/Chair Transfer Assistive Devices: Bed rails;HOB elevated;Arm rests Bed/Chair Transfer: 4: Supine > Sit: Min A (steadying Pt. > 75%/lift 1 leg);3: Bed > Chair or W/C: Mod A (lift or lower assist);4: Chair or  W/C > Bed: Min A (steadying Pt. > 75%)  FIM - Locomotion: Wheelchair Locomotion: Wheelchair: 0: Activity did not occur FIM - Locomotion: Ambulation Ambulation/Gait Assistance: 4: Min guard;4: Min assist Locomotion: Ambulation: 0: Activity did not occur  Comprehension Comprehension Mode: Auditory Comprehension: 5-Understands basic 90% of the time/requires cueing < 10% of the time  Expression Expression Mode: Verbal Expression: 4-Expresses basic 75 - 89% of the time/requires cueing 10 - 24% of the time. Needs helper to occlude trach/needs to repeat words.  Social Interaction Social Interaction: 4-Interacts appropriately 75 - 89% of the time - Needs redirection for appropriate language or to initiate interaction.  Problem Solving Problem Solving: 2-Solves basic 25 - 49% of the time - needs direction more than half the time to initiate, plan or complete simple activities  Memory Memory: 3-Recognizes or recalls 50 - 74% of the time/requires cueing 25 - 49% of the time  Medical Problem List and Plan:  ICH with seizure activity--repeat CCT today given ongoing language/cognitive issues  -i believe her presentation is mostly language based  -still may need to recheck CT next week to decide on ASA resumption 1. DVT Prophylaxis/Anticoagulation: Pharmaceutical: Lovenox  2. Pain Management: Continue tylenol prn for headaches. Add muscle relaxers for exacerbation of chronic back pain  -lumbar xrays show spondylosis, no fx or substantial ddd  -kpad for back. Need to limit narcs to avoid excessive confusion   -lidoderm patches for low back qhs prn added. ?applied 3. H/o depression/Mood: team egosuport, sw follow up  4. Neuropsych: This patient is not capable of making decisions on her own behalf at  this moment.  5. A fib with PPM: Continue amiodarone and metoprolol. No anticoagulation at this time and f/u with cards in [place].  7. New onset seizure: on keppra bid 8. Aspiration PNA: continue  levaquin, cxr better 9. Confusion: labs unremarkable so far.    -recheck cxr improved  -limit neurosedating meds  -a lot of problems are language based  -will add low dose seroquel in the evening to help with sundowning   LOS (Days) 3 A FACE TO FACE EVALUATION WAS PERFORMED  SWARTZ,ZACHARY T 05/27/2013 8:42 AM

## 2013-05-27 NOTE — Progress Notes (Signed)
Pt awakened from sleep Denies pain discomfort at present. Pt denied need when asked how are you doing "fine".  Assisted to bathroom by NT; legs still wobbly from ativan. Voided and returned to recliner chair. Dtr at side. Sitter removed from room and dtr instructed to call if pt tried to get up or became agitated again. Quick release belt applied and pt repositioned in recliner with feet elevated. Continue to monitor. [name]

## 2013-05-27 NOTE — Progress Notes (Signed)
Returned from radiology [phone]; assisted pt to get out of bed and into chair. Returned pt to front desk and pt became agitated, yelling out crying noting that she wanted to go home. Tried to reason with pt about rationale for hospitalization and need to stay for awhile for monitoring and pt escalated, swinging and hitting at staff. NT and RN assisted pt to stand and walked patient back to room. Marissa Nestle [facility] contacted regarding behavior and new order for ativan given and administered. Pt continued to walk around room and would not sit or lie in bed. Staff stayed with patient for safety. Dtr called ; informed of incident and requested to come in to sit with patient for awhile. [name]

## 2013-05-28 ENCOUNTER — Inpatient Hospital Stay (HOSPITAL_COMMUNITY): Payer: Medicare Other

## 2013-05-28 ENCOUNTER — Inpatient Hospital Stay (HOSPITAL_COMMUNITY): Payer: Medicare Other | Admitting: *Deleted

## 2013-05-28 ENCOUNTER — Inpatient Hospital Stay (HOSPITAL_COMMUNITY): Payer: Medicare Other | Admitting: Physical Therapy

## 2013-05-28 LAB — BASIC METABOLIC PANEL
BUN: 20 mg/dL (ref 6–23)
Calcium: 8.7 mg/dL (ref 8.4–10.5)
GFR calc Af Amer: 62 mL/min — ABNORMAL LOW (ref >90)
GFR calc non Af Amer: 54 mL/min — ABNORMAL LOW (ref >90)
Sodium: 136 mEq/L (ref 135–145)

## 2013-05-28 LAB — TROPONIN I: Troponin I: <0.3 ng/mL (ref <0.30)

## 2013-05-28 MED ORDER — QUETIAPINE FUMARATE 25 MG PO TABS
25.0000 mg | ORAL_TABLET | Freq: Every day | ORAL | Status: DC | PRN
  Filled 2013-05-28: qty 1

## 2013-05-28 NOTE — Progress Notes (Signed)
Nursing Note: Pt still sleeping.T-98.3[ ax] p-69 r-17 BP- 131/74 PO2 97% .Arouses,then back to sleep.Moans in her sleep.No distress noted.wbb

## 2013-05-28 NOTE — Progress Notes (Signed)
Occupational Therapy Note  Patient Details  Name: Ann Kaufman MRN: [id number] Date of Birth: Apr 18, 1930 Today's Date: 05/28/2013  Time:1000-1055  (55 min) 1st session Pain:Right leg 6/10  Kept eyes closed 80 %of time Individual session  Pt lying supine upon OT arrival.  She kept eyes closed90 % of session and moaned with pain.  Pt. Stated she did not sleep well last night.  She said [facility] a [facility] in [place].  She fluctuated mostly to sleep and pain mode throughout session.  RN reported pt fell on 12/5 in bathroom and was going to get right foot xrayed.  .  Not oriented to day    Delayed response when asking pt to wash various body parts.  Pt. Washed face but no other body parts.  Just kept moaning, and keeping eyes closed.  Rolled to left with mod assist for peri care.  Left in room with xray taking her to get right foot xrayed.    Time:  1600-1710 (70 min)  2nd session Pain:general pain on right leg with activity; no number given Individual session  Pt. Lying in bed upon OT arrival.  Pt's friend Thurston Hole present.    Pt. Reported she needed to use toilet.  Pt. Went from supine to sit with max assist.  Sat EOB with moaning and groaning.  Attempted lateral scoot transfer to wc and then [facility] , but unable.  Laid pt back on bed and placed bedpan under pt.  Unable to void.  Pt. Had no underwear so placed depends on pt with her rolling to right and left with mod assist.  Nursing came in to assist and ambulated pt to bathroom with total assist +2 with pt leaking urine along the way.  Pt. Completed urination and ambulated about 4 feet and sat in wc with +2 and mod tactile cues to turn and sit.  Pt. Still moaning.  Placed pt at sink and she washed her hands and brushed teeth with set up assist.  Pt. Reported she needed to go to bathroom again.  Did stand pivot transfer from wc to toilet with mod assist and back to wc after pt unable to void or have BM.  Left pt in wc with safety  belt on and call bell,phone within reach.Clearence Cheek still present in room       Humberto Seals 05/28/2013, 10:14 AM

## 2013-05-28 NOTE — Progress Notes (Signed)
Nursing Note: Gave history and events form earlier to doctor that came as result of code called.Pt had a fall ,on day shift,had been agitated all day and received ativan during the day and at bedtime received her first dose of seroquel 50 mg.No further sedatives to be given and will monitor closely.wbb

## 2013-05-28 NOTE — Progress Notes (Deleted)
Occupational Therapy Note  Patient Details  Name: Ann Kaufman MRN: [id number] Date of Birth: [birth year] Today's Date: 05/28/2013  Time:1600-1700  (60 min) Pain:  General paiin on right leg.  No number given Individual session    Humberto Seals 05/28/2013, 5:12 PM

## 2013-05-28 NOTE — Progress Notes (Signed)
Nursing Note: Pt did have another episode that was syncopal in nature where she has a blank ,unresponsive stare but it was short lived,less than 30 seconds.wbb

## 2013-05-28 NOTE — Progress Notes (Signed)
SLP Cancellation Note  Patient Details Name: Ann Kaufman MRN: [id number] DOB: [birth year]   Cancelled treatment:        Pt missed 30 minutes of skilled SLP treatment due to fatigue. SLP made several attempts at arousing patient, with pt opening her eyes x2 briefly before falling back asleep. Per NT, pt has been this fatigued throughout the day. Will continue current plan of care.   [name], M.A. CCC-SLP [phone]    [name] 05/28/2013, 2:22 PM

## 2013-05-28 NOTE — Progress Notes (Signed)
Nursing Note: T-97.9 axillary Bp 120/60 p-63  PO 2 96 %. R-16  Oxygen 1 L n/c on.No distress noted. Sleeping hard but arouses.wbb

## 2013-05-28 NOTE — Progress Notes (Signed)
Nursing Note: Therapy reported r ankle pain w/ treatment and Dr.Swartz made aware.wbb

## 2013-05-28 NOTE — Progress Notes (Signed)
Nursing Note: Pt is sleepy,lethargic but arouses when name called and shook.BP 140/60 p-60 PO2 96 %.Pt goes back to sleep once aroused.Pt to go for CT and lab paged.Daughter called and made arware of events.Pt in a deep snoring sleep,but arouses and nods and will eventually respond that she is okay.wbb

## 2013-05-28 NOTE — Progress Notes (Signed)
Nursing Note:Pt was at the nurses station due  to agitation.Pt was asleep so Caryn Bee ,nt and myself were putting the pt to bed.Caryn Bee was sent to get gloves and a brief and before he got back the pt suddenly slumped over and when I assessed her she was not breathing. I called a code .Caryn Bee and I got her in bed immediately to start CPR and the pt began to awaken and fuss.Pt was aroused and breathing and responding to me that time.I paged to make the operator aware that the pt was responding.The code team and doctors arrived and assessed pt and ordered stat labs,CT of head w/o contrast  and IVF and Dr. Riley Kill paged.wbb

## 2013-05-28 NOTE — Progress Notes (Signed)
Nursing Note: Pt  Awake and therapy at the bedside and pt alert and talking ,calm and cooperative.wbb

## 2013-05-28 NOTE — Progress Notes (Signed)
Brief progressive note  Patient was found to not have breath per nurse and code blue was called. When I was on the way running to patient's room, code blue was cancelled. I actually came to patient's room. Per nurse, patient was found to have stopped breathing which lasted for less than one minute and then pt started breathing spontaneously. I evaluated the patient, patient seems to be confused, which is at her baseline per her nurse and her doctor, Dr. Sharen Heck. Her blood pressure and oxygen saturation are normal. Patient denies chest pain, shortness of breath, or any pain in any part of her body. She moves all her extremities. Per her nurse, patient had a fall before the event,  which was not witnessed by anybody. It is not very clear whether patient injured her head or not. I ordered stat lab, including BMP, troponin and CT head without contrast. Her electrolytes on BMP are normal and troponin is negative. CT-head showed no new intracranial hemorrhage or other process identified status post fall. I updated the event to Dr. Riley Kill on the phone. We decided to observe pt. If her condition get worse, will ask previous treatment team to admit patient back.    Lorretta Harp, MD PGY3, Internal Medicine Teaching Service Pager: [phone]

## 2013-05-28 NOTE — Progress Notes (Signed)
Physical Therapy Session Note  Patient Details  Name: Ann Kaufman MRN: [id number] Date of Birth: Oct 25, 1929  Today's Date: 05/28/2013 Time: 0730-0815 Time Calculation (min): 45 min  Short Term Goals: Week 1:  PT Short Term Goal 1 (Week 1): STGs=LTGs due to anticipated LOS  Therapy Documentation Precautions:  Precautions Precautions: Fall Restrictions Weight Bearing Restrictions: No Pain: patient did not verbalize where pain was but moans with pain and slight withdrawal when compressing at R ankle joint  Therapeutic Exercise:(15') AAROM/AROM B LE's in supine with heelslides with moderate cues for participation Therapeutic Activity:(30') Bed mobility Max/Total assist for rolling side to side, supine ->R sidelying -> sitting with Max/Total assist and Total assist to scoot to EOB, static sitting balance x 10'  at EOB S/min-guard, Transfers sit<->stand with blocking assist at L knee and handheld assist (Total assist) stood partially 2x. Patient was not c/o pain but also unable to fully standup straight with verbal cues/encouragement. BTB with Total assist and scoot to [facility] with Total assist. Patient was positioned comfortably and bed alarm placed with 3 rails up and call bell within reach. Patient kept eyes closed 90% of tx session and would open briefly on command.     Therapy/Group: Individual Therapy  Bartholomew Ramesh J 05/28/2013, 7:31 AM

## 2013-05-28 NOTE — Progress Notes (Signed)
Nursing Note;No void this shift. SCanned for >550 and pt in and out cathed after unable to void. Cathed for 600 cc. Pt tolerated well.wbb

## 2013-05-28 NOTE — Progress Notes (Signed)
Subjective/Complaints: Eventful night. RN reports pt became unresponsive when they were getting her back to bed last night. A code was called. When code team arrived (Nr. Clyde Lundborg) pt had become responsive again. All of work up was negative. Pt essentially slept the rest of the evening without issue.  A 12 point review of systems has been performed and if not noted above is otherwise negative.   Objective: Vital Signs: Blood pressure 123/84, pulse 71, temperature 98 F (36.7 C), temperature source Oral, resp. rate 18, weight 72.2 kg (159 lb 2.8 oz), SpO2 100.00%. Ct Head Wo Contrast  05/28/2013   CLINICAL DATA:  Fall  EXAM: CT HEAD WITHOUT CONTRAST  TECHNIQUE: Contiguous axial images were obtained from the base of the skull through the vertex without intravenous contrast.  COMPARISON:  Prior CT from 05/27/2013  FINDINGS: Left frontal lobe hematoma is slightly decreased in size now measuring 3.3 x 2.5 cm (previously 2.6 x 3.6 cm). Associated vasogenic edema is similar. No no significant midline shift identified. There is no hydrocephalus.  No new intracranial hemorrhage identified status post fall. There is no extra-axial fluid collection. No large vessel territory infarct identified. No mass lesion.  Calvarium remains intact. Globes are within normal limits. Paranasal sinuses and mastoid air cells are clear  IMPRESSION: 1. No new intracranial hemorrhage or other process identified status post fall. 2. Slight interval decrease in size of left frontal lobe hematoma now measuring 2.5 x 3.3 cm (previously 3.6 x 2.6 cm). No midline shift or hydrocephalus.   Electronically Signed   By: Rise Mu M.D.   On: 05/28/2013 00:08   Ct Head Wo Contrast  05/27/2013   CLINICAL DATA:  Followup intracranial hemorrhage  EXAM: CT HEAD WITHOUT CONTRAST  TECHNIQUE: Contiguous axial images were obtained from the base of the skull through the vertex without intravenous contrast.  COMPARISON:  CT 05/19/2013  FINDINGS:  Left frontal lobe hematoma is slightly smaller now measuring 36 x 26 mm. Surrounding edema is present with mild local mass effect. Intraventricular hemorrhage has resolved. No hydrocephalus. No new hemorrhage.  Generalized atrophy.  No acute ischemic infarction.  IMPRESSION: Left frontal lobe hematoma slightly smaller. Ventricular hemorrhage has resolved. Question cerebral amyloid versus trauma. Underlying infarct, mass or vascular malformation consider less likely. Followup until clearing suggested.   Electronically Signed   By: Marlan Palau M.D.   On: 05/27/2013 12:18   No results found for this basename: WBC, HGB, HCT, PLT,  in the last 72 hours  Recent Labs  05/27/13 2325  NA 136  K 3.8  CL 100  GLUCOSE 116*  BUN 20  CREATININE 0.97  CALCIUM 8.7   CBG (last 3)  No results found for this basename: GLUCAP,  in the last 72 hours  Wt Readings from Last 3 Encounters:  05/28/13 72.2 kg (159 lb 2.8 oz)  05/24/13 78.291 kg (172 lb 9.6 oz)  04/06/13 74.481 kg (164 lb 3.2 oz)    Physical Exam:  Constitutional: She is oriented to person, place, and time. She appears well-developed and well-nourished.  HENT:  Head: Normocephalic and atraumatic.  Eyes: Conjunctivae and EOM are normal. Pupils are equal, round, and reactive to light. Right eye exhibits no discharge. Left eye exhibits no discharge. No scleral icterus.  Neck: Normal range of motion. Neck supple.  Cardiovascular: Normal rate and regular rhythm. Exam reveals no gallop and no friction rub.  No murmur heard.  Respiratory: Effort normal. No respiratory distress. She has no wheezes.  GI:  Soft. Bowel sounds are normal. She exhibits no distension. There is no tenderness.  Musculoskeletal: She exhibits no edema. Tenderness with palpation of the lower lumbar spine and ito bilateral buttocks, appeared less intense. Right ankle tender with ROM--non-specific  Neurological: She is drowsy, distracted, confused. Limited insight and  awareness Right facial weakness. Speech with dysarthria. Language is aphasic. Sometime language of confusion? slow to arouse this am.. . Mild right sided weakness,  No gross sensory changes.    Skin: Skin is warm and dry.  A few limb bruises noted  Psychiatric: slow to arouse this am.   Assessment/Plan: 1. Functional deficits secondary to left frontal hematoma which require 3+ hours per day of interdisciplinary therapy in a comprehensive inpatient rehab setting. Physiatrist is providing close team supervision and 24 hour management of active medical problems listed below. Physiatrist and rehab team continue to assess barriers to discharge/monitor patient progress toward functional and medical goals. FIM: FIM - Bathing Bathing Steps Patient Completed: Chest;Right Arm;Left Arm;Abdomen;Front perineal area;Buttocks;Right upper leg;Left upper leg;Right lower leg (including foot);Left lower leg (including foot) Bathing: 4: Steadying assist  FIM - Upper Body Dressing/Undressing Upper body dressing/undressing steps patient completed: Thread/unthread right bra strap;Thread/unthread left bra strap;Hook/unhook bra;Thread/unthread right sleeve of pullover shirt/dresss;Thread/unthread left sleeve of pullover shirt/dress;Put head through opening of pull over shirt/dress;Pull shirt over trunk Upper body dressing/undressing: 5: Supervision: Safety issues/verbal cues FIM - Lower Body Dressing/Undressing Lower body dressing/undressing steps patient completed: Thread/unthread right underwear leg;Thread/unthread left underwear leg;Pull underwear up/down;Thread/unthread right pants leg;Thread/unthread left pants leg;Pull pants up/down;Don/Doff right sock;Don/Doff left sock Lower body dressing/undressing: 4: Min-Patient completed 75 plus % of tasks  FIM - Toileting Toileting steps completed by patient: Adjust clothing prior to toileting;Performs perineal hygiene;Adjust clothing after toileting Toileting: 4:  Steadying assist  FIM - Toilet Transfers Toilet Transfers: 3-To toilet/BSC: Mod A (lift or lower assist);3-From toilet/BSC: Mod A (lift or lower assist)  FIM - Press photographer Assistive Devices: Arm rests Bed/Chair Transfer: 4: Chair or W/C > Bed: Min A (steadying Pt. > 75%);3: Supine > Sit: Mod A (lifting assist/Pt. 50-74%/lift 2 legs;3: Sit > Supine: Mod A (lifting assist/Pt. 50-74%/lift 2 legs);4: Bed > Chair or W/C: Min A (steadying Pt. > 75%)  FIM - Locomotion: Wheelchair Locomotion: Wheelchair: 3: Travels 150 ft or more: maneuvers on rugs and over door sills with moderate assistance  (Pt: 50 - 74%) FIM - Locomotion: Ambulation Ambulation/Gait Assistance: 4: Min guard;4: Min assist Locomotion: Ambulation: 0: Activity did not occur  Comprehension Comprehension Mode: Auditory Comprehension: 1-Understands basic less than 25% of the time/requires cueing 75% of the time  Expression Expression Mode: Verbal Expression: 2-Expresses basic 25 - 49% of the time/requires cueing 50 - 75% of the time. Uses single words/gestures.  Social Interaction Social Interaction: 2-Interacts appropriately 25 - 49% of time - Needs frequent redirection.  Problem Solving Problem Solving: 1-Solves basic less than 25% of the time - needs direction nearly all the time or does not effectively solve problems and may need a restraint for safety  Memory Memory: 1-Recognizes or recalls less than 25% of the time/requires cueing greater than 75% of the time  Medical Problem List and Plan:  ICH with seizure activity--both CT's done last night showed improvement  -i believe her presentation is mostly language based  -still may need to recheck CT next week to decide on ASA resumption 1. DVT Prophylaxis/Anticoagulation: Pharmaceutical: Lovenox  2. Pain Management: Continue tylenol prn for headaches. Add muscle relaxers for exacerbation of chronic  back pain  -lumbar xrays show spondylosis, no fx  or substantial ddd  -kpad for back. Need to limit narcs to avoid excessive confusion   -lidoderm patches for low back qhs prn added.  -will check right ankle xrays 3. H/o depression/Mood: team egosuport, sw follow up  4. Neuropsych: This patient is not capable of making decisions on her own behalf at this moment  -seroquel added for sundowing and agitation (hold today's am scheduled dose--make prn)  -I am hopeful that with a few uneventful evenings we will improve her cognitive-linguistic presentation and sleep wake cycle..  5. A fib with PPM: Continue amiodarone and metoprolol. No anticoagulation at this time and f/u with cards in [place].  7. New onset seizure: on keppra bid 8. Aspiration PNA: continue levaquin, cxr better 9. Unresponsive episode: ?syncopal  -work up negative    -continue observation closely  LOS (Days) 4 A FACE TO FACE EVALUATION WAS PERFORMED  SWARTZ,ZACHARY T 05/28/2013 8:58 AM

## 2013-05-29 MED ORDER — QUETIAPINE FUMARATE 50 MG PO TABS
50.0000 mg | ORAL_TABLET | Freq: Every day | ORAL | Status: DC
  Filled 2013-05-29: qty 1

## 2013-05-29 MED ORDER — QUETIAPINE FUMARATE 25 MG PO TABS
25.0000 mg | ORAL_TABLET | Freq: Every day | ORAL | Status: DC
  Filled 2013-05-29 (×3): qty 1

## 2013-05-29 MED ORDER — SODIUM CHLORIDE 0.45 % IV SOLN
INTRAVENOUS | Status: DC
  Administered 2013-05-29 – 2013-05-30 (×2): via INTRAVENOUS

## 2013-05-29 MED ORDER — LORAZEPAM 2 MG/ML IJ SOLN
1.0000 mg | Freq: Once | INTRAMUSCULAR | Status: AC
  Administered 2013-05-29: 1 mg via INTRAMUSCULAR
  Filled 2013-05-29: qty 1

## 2013-05-29 NOTE — Progress Notes (Signed)
Notified Dr. Riley Kill of patient's increasing agitation and restlessness tonight.  New order received for Ativan 1mg  IM.  Ativan administered at 2102 as ordered.  Patient crying in bed and picking at IV tubing, brief, clothing, bed linens.  Denied pain when asked.  Stated " no I do not hurt" to this nurse.  Refused all po medications.  Patient was spitting and pushing medications away.  Refused all po nourishment.  At this time, patient is resting quietly with her eyes closed and no signs of distress or discomfort.  Will continue to monitor for changes in condition.    Kelli Hope M

## 2013-05-29 NOTE — Progress Notes (Signed)
Subjective/Complaints: Much better evening. Was calm upon my examination. Swung at tech later this morning after i left. .  A 12 point review of systems has been performed and if not noted above is otherwise negative.   Objective: Vital Signs: Blood pressure 132/68, pulse 81, temperature 98.5 F (36.9 C), temperature source Oral, resp. rate 18, weight 69.854 kg (154 lb), SpO2 98.00%. Dg Ankle Complete Right  05/28/2013   CLINICAL DATA:  Pain, possible history of fall  EXAM: RIGHT ANKLE - COMPLETE 3+ VIEW  COMPARISON:  None.  FINDINGS: Three views of the right ankle reveal the joint mortise to be preserved. The talar dome is intact. There is no evidence of an acute malleolar fracture. There are mild degenerative changes of the tip of the medial malleolus. There is mild soft tissue swelling diffusely. The talus and calcaneus appear intact. There are plantar and Achilles region calcaneal spurs.  IMPRESSION: There is no evidence of an acute fracture nor dislocation of the right ankle. There is irregularity of the contour of the medial malleolus which does not appear to reflect an acute fracture but could reflect an old fracture or or other old injury. There is mild soft tissue swelling diffusely.   Electronically Signed   By: David  [place]   On: 05/28/2013 11:56   Ct Head Wo Contrast  05/28/2013   CLINICAL DATA:  Fall  EXAM: CT HEAD WITHOUT CONTRAST  TECHNIQUE: Contiguous axial images were obtained from the base of the skull through the vertex without intravenous contrast.  COMPARISON:  Prior CT from 05/27/2013  FINDINGS: Left frontal lobe hematoma is slightly decreased in size now measuring 3.3 x 2.5 cm (previously 2.6 x 3.6 cm). Associated vasogenic edema is similar. No no significant midline shift identified. There is no hydrocephalus.  No new intracranial hemorrhage identified status post fall. There is no extra-axial fluid collection. No large vessel territory infarct identified. No mass lesion.   Calvarium remains intact. Globes are within normal limits. Paranasal sinuses and mastoid air cells are clear  IMPRESSION: 1. No new intracranial hemorrhage or other process identified status post fall. 2. Slight interval decrease in size of left frontal lobe hematoma now measuring 2.5 x 3.3 cm (previously 3.6 x 2.6 cm). No midline shift or hydrocephalus.   Electronically Signed   By: Rise Mu M.D.   On: 05/28/2013 00:08   Ct Head Wo Contrast  05/27/2013   CLINICAL DATA:  Followup intracranial hemorrhage  EXAM: CT HEAD WITHOUT CONTRAST  TECHNIQUE: Contiguous axial images were obtained from the base of the skull through the vertex without intravenous contrast.  COMPARISON:  CT 05/19/2013  FINDINGS: Left frontal lobe hematoma is slightly smaller now measuring 36 x 26 mm. Surrounding edema is present with mild local mass effect. Intraventricular hemorrhage has resolved. No hydrocephalus. No new hemorrhage.  Generalized atrophy.  No acute ischemic infarction.  IMPRESSION: Left frontal lobe hematoma slightly smaller. Ventricular hemorrhage has resolved. Question cerebral amyloid versus trauma. Underlying infarct, mass or vascular malformation consider less likely. Followup until clearing suggested.   Electronically Signed   By: Marlan Palau M.D.   On: 05/27/2013 12:18   No results found for this basename: WBC, HGB, HCT, PLT,  in the last 72 hours  Recent Labs  05/27/13 2325  NA 136  K 3.8  CL 100  GLUCOSE 116*  BUN 20  CREATININE 0.97  CALCIUM 8.7   CBG (last 3)  No results found for this basename: GLUCAP,  in the last  72 hours  Wt Readings from Last 3 Encounters:  05/29/13 69.854 kg (154 lb)  05/24/13 78.291 kg (172 lb 9.6 oz)  04/06/13 74.481 kg (164 lb 3.2 oz)    Physical Exam:  Constitutional: She is oriented to person, place, and time. She appears well-developed and well-nourished.  HENT:  Head: Normocephalic and atraumatic.  Eyes: Conjunctivae and EOM are normal. Pupils  are equal, round, and reactive to light. Right eye exhibits no discharge. Left eye exhibits no discharge. No scleral icterus.  Neck: Normal range of motion. Neck supple.  Cardiovascular: Normal rate and regular rhythm. Exam reveals no gallop and no friction rub.  No murmur heard.  Respiratory: Effort normal. No respiratory distress. She has no wheezes.  GI: Soft. Bowel sounds are normal. She exhibits no distension. There is no tenderness.  Musculoskeletal: She exhibits no edema. Tenderness with palpation of the lower lumbar spine and ito bilateral buttocks, appeared less intense. Right ankle tender with ROM--non-specific  Neurological: She is drowsy, distracted, confused. Limited insight and awareness Right facial weakness. Speech with dysarthria. Language is aphasic. Sometime language of confusion? slow to arouse this am.. . Mild right sided weakness,  No gross sensory changes.    Skin: Skin is warm and dry.  A few limb bruises noted  Psychiatric: slow to arouse this am.   Assessment/Plan: 1. Functional deficits secondary to left frontal hematoma which require 3+ hours per day of interdisciplinary therapy in a comprehensive inpatient rehab setting. Physiatrist is providing close team supervision and 24 hour management of active medical problems listed below. Physiatrist and rehab team continue to assess barriers to discharge/monitor patient progress toward functional and medical goals. FIM: FIM - Bathing Bathing Steps Patient Completed: Chest;Right Arm;Left Arm;Abdomen Bathing: 3: Mod-Patient completes 5-7 23f 10 parts or 50-74%  FIM - Upper Body Dressing/Undressing Upper body dressing/undressing steps patient completed: Thread/unthread right bra strap;Thread/unthread left bra strap;Hook/unhook bra;Thread/unthread right sleeve of pullover shirt/dresss;Thread/unthread left sleeve of pullover shirt/dress;Put head through opening of pull over shirt/dress;Pull shirt over trunk Upper body  dressing/undressing: 0: Wears gown/pajamas-no public clothing FIM - Lower Body Dressing/Undressing Lower body dressing/undressing steps patient completed: Thread/unthread right underwear leg;Thread/unthread left underwear leg;Pull underwear up/down;Thread/unthread right pants leg;Thread/unthread left pants leg;Pull pants up/down;Don/Doff right sock;Don/Doff left sock Lower body dressing/undressing: 0: Wears gown/pajamas-no public clothing  FIM - Toileting Toileting steps completed by patient: Adjust clothing prior to toileting;Performs perineal hygiene;Adjust clothing after toileting Toileting Assistive Devices: Grab bar or rail for support Toileting: 1: Total-Patient completed zero steps, helper did all 3  FIM - Diplomatic Services operational officer Devices: Grab bars Toilet Transfers: 1-Two helpers  FIM - Banker Devices: Bed rails Bed/Chair Transfer: 2: Sit > Supine: Max A (lifting assist/Pt. 25-49%);1: Two helpers  FIM - Locomotion: Printmaker: Wheelchair: 3: Travels 150 ft or more: maneuvers on rugs and over door sills with moderate assistance  (Pt: 50 - 74%) FIM - Locomotion: Ambulation Ambulation/Gait Assistance: 4: Min guard;4: Min assist Locomotion: Ambulation: 0: Activity did not occur  Comprehension Comprehension Mode: Auditory Comprehension: 3-Understands basic 50 - 74% of the time/requires cueing 25 - 50%  of the time  Expression Expression Mode: Verbal Expression: 2-Expresses basic 25 - 49% of the time/requires cueing 50 - 75% of the time. Uses single words/gestures.  Social Interaction Social Interaction: 2-Interacts appropriately 25 - 49% of time - Needs frequent redirection.  Problem Solving Problem Solving: 1-Solves basic less than 25% of the time - needs direction nearly all the  time or does not effectively solve problems and may need a restraint for safety  Memory Memory: 2-Recognizes or recalls 25 -  49% of the time/requires cueing 51 - 75% of the time  Medical Problem List and Plan:  ICH with seizure activity--both CT's done last night showed improvement  -i believe her presentation is mostly language based  -still may need to recheck CT next week to decide on ASA resumption 1. DVT Prophylaxis/Anticoagulation: Pharmaceutical: Lovenox  2. Pain Management: Continue tylenol prn for headaches. Added muscle relaxers for exacerbation of chronic back pain  -lumbar xrays show spondylosis, no fx or substantial ddd  -kpad for back. Need to limit narcs to avoid excessive confusion   -lidoderm patches for low back qhs prn added.  -ankle xray negative 3. H/o depression/Mood: team egosuport, sw follow up  4. Neuropsych: This patient is not capable of making decisions on her own behalf at this moment  -seroquel added for sundowing and agitation. Resume day time dose given morning agitation today  -I am hopeful that with a few uneventful evenings we will see improvement in her cognitive-linguistic presentation and sleep wake cycle..  5. A fib with PPM: Continue amiodarone and metoprolol. No anticoagulation at this time and f/u with cards in [place].  7. New onset seizure: on keppra bid 8. Aspiration PNA: continue levaquin, cxr better 9. Unresponsive episode: ?syncopal  -work up negative    -continue observation closely  LOS (Days) 5 A FACE TO FACE EVALUATION WAS PERFORMED  Trampas Stettner T 05/29/2013 8:32 AM

## 2013-05-29 NOTE — Progress Notes (Signed)
Patient assisted up to bathroom to void upon request of patient. 1 assist. Continent of urine. Did not eat breakfast, refused. Magic cup and orange juice given with pills. Difficulty getting medications. Patient with refusal to take pills, increased agitation, confusion, and tearfulness. Patient up to nurse station for a while then assisted back to bed. Daughter at bedside. Sherlyn Lees, RN

## 2013-05-29 NOTE — Progress Notes (Signed)
Discussed patient's mentation with Dr. Riley Kill. IVF .45 NS started at 75/hour. Seroquel on hold for PM. Patient continues to be lethargic, agitated with any change in position or attempt to offer food or hydration. Will monitor. Sherlyn Lees, RN

## 2013-05-29 NOTE — Progress Notes (Signed)
Attempted to obtain vital signs and provide care to patient this morning.  Patient became agitated and started to swat at nurse tech.  Patient naked in bed. Tech attempted to get patient dressed without success.  Patient believed that staff just wanted to see her naked.  Unable to redirect.  Will provide privacy and encouragement and attempt to provide care at a later time to allow patient to deescalate.  Patient slept throughout the night without sign of distress or discomfort.  No attempts made to get out of bed unassisted.  Will monitor for changes in condition.    Kelli Hope M

## 2013-05-30 ENCOUNTER — Inpatient Hospital Stay (HOSPITAL_COMMUNITY): Payer: Medicare Other | Admitting: Physical Therapy

## 2013-05-30 ENCOUNTER — Inpatient Hospital Stay (HOSPITAL_COMMUNITY): Payer: Medicare Other

## 2013-05-30 ENCOUNTER — Inpatient Hospital Stay (HOSPITAL_COMMUNITY): Payer: Medicare Other | Admitting: Speech Pathology

## 2013-05-30 DIAGNOSIS — Z950 Presence of cardiac pacemaker: Secondary | ICD-10-CM | POA: Diagnosis present

## 2013-05-30 DIAGNOSIS — R55 Syncope and collapse: Secondary | ICD-10-CM | POA: Diagnosis not present

## 2013-05-30 DIAGNOSIS — I480 Paroxysmal atrial fibrillation: Secondary | ICD-10-CM | POA: Diagnosis present

## 2013-05-30 DIAGNOSIS — IMO0002 Reserved for concepts with insufficient information to code with codable children: Secondary | ICD-10-CM | POA: Diagnosis not present

## 2013-05-30 LAB — COMPREHENSIVE METABOLIC PANEL
AST: 25 U/L (ref 0–37)
Albumin: 2.7 g/dL — ABNORMAL LOW (ref 3.5–5.2)
BUN: 14 mg/dL (ref 6–23)
CO2: 25 mEq/L (ref 19–32)
Calcium: 8.1 mg/dL — ABNORMAL LOW (ref 8.4–10.5)
Chloride: 101 mEq/L (ref 96–112)
Creatinine, Ser: 0.85 mg/dL (ref 0.50–1.10)
GFR calc Af Amer: 73 mL/min — ABNORMAL LOW (ref >90)
GFR calc non Af Amer: 63 mL/min — ABNORMAL LOW (ref >90)
Glucose, Bld: 104 mg/dL — ABNORMAL HIGH (ref 70–99)
Total Bilirubin: 0.6 mg/dL (ref 0.3–1.2)

## 2013-05-30 LAB — CBC
HCT: 33.3 % — ABNORMAL LOW (ref 36.0–46.0)
Hemoglobin: 11 g/dL — ABNORMAL LOW (ref 12.0–15.0)
MCH: 27.7 pg (ref 26.0–34.0)
MCV: 83.9 fL (ref 78.0–100.0)
Platelets: 198 10*3/uL (ref 150–400)
RBC: 3.97 MIL/uL (ref 3.87–5.11)
WBC: 6.4 10*3/uL (ref 4.0–10.5)

## 2013-05-30 MED ORDER — ESCITALOPRAM OXALATE 5 MG PO TABS
5.0000 mg | ORAL_TABLET | Freq: Every day | ORAL | Status: DC
  Administered 2013-05-30 – 2013-06-01 (×3): 5 mg via ORAL
  Filled 2013-05-30 (×4): qty 1

## 2013-05-30 MED ORDER — RISPERIDONE 0.5 MG PO TABS
0.5000 mg | ORAL_TABLET | Freq: Every day | ORAL | Status: DC
  Administered 2013-05-30: 0.5 mg via ORAL
  Filled 2013-05-30 (×2): qty 1

## 2013-05-30 MED ORDER — OXYCODONE HCL 5 MG PO TABS
5.0000 mg | ORAL_TABLET | Freq: Four times a day (QID) | ORAL | Status: DC | PRN
  Administered 2013-05-30 – 2013-06-01 (×4): 5 mg via ORAL
  Filled 2013-05-30 (×4): qty 1

## 2013-05-30 NOTE — Progress Notes (Signed)
Physical Therapy Note  Patient Details  Name: Ann Kaufman MRN: [id number] Date of Birth: [birth year] Today's Date: 05/30/2013  1000 Pt missed 60 minute PT session secondary to : initially off floor for X ray , pt refused PT session / very lethargic with difficulty remaining awake. Will check for PM session.    1500-1530 (30 minutes) individual Pain: pt initially reports no pain Focus of treatment : standing tolerance Treatment:Pt in bed upon arrival; requires max encouragement to participate; supine to sit mod assist; transfer stand/turn max assist +1; stand X 3 in parallel bars with mod assist sit to stand /mod assist to maintain standing for 1-2 minutes ; pt very anxious and tearful but reports no pain ; returned to bed with bed alarm activated.Marland Kitchen   Dalonda Simoni,JIM 05/30/2013, 10:26 AM

## 2013-05-30 NOTE — Progress Notes (Signed)
Patient complaining of stomach and lower back pain.  Patient did agree to take Tramadol as ordered for back pain.  Last BM recorded on 12/1.  Patient checked for stool with large amount in rectal vault.  Small amount of stool removed and suppository given.  Patient was assisted to bathroom within 15 minutes of administration of suppository and expelled large, soft BM with assistance.  Assisted back to bed without incident.    Kelli Hope M

## 2013-05-30 NOTE — Progress Notes (Signed)
Patient Name: Ann Kaufman Date of Encounter: 05/30/2013     Principal Problem:   ICH (intracerebral hemorrhage) Active Problems:   PAF (paroxysmal atrial fibrillation)   HTN (hypertension)   Syncope    SUBJECTIVE  Asked to see pt by primary team 2/2 a syncopal episode that occurred on Friday night.  Notes from that evening indicate that patient was sitting in a chair @ the bedside when she was noted to have slumped and became unresponsive and apneic.  A Code Blue was called and she was placed back in bed at which point, pt became responsive.  Imaging/labs showed no acute findings.  No recurrent loss of consciousness since Friday night.  Pt reports right lower back/hip/buttock pain this AM.  No chest pain or sob.  No tachypalpitations.  CURRENT MEDS . amiodarone  200 mg Oral Daily  . antiseptic oral rinse  15 mL Mouth Rinse q12n4p  . chlorhexidine  15 mL Mouth Rinse BID  . escitalopram  5 mg Oral QHS  . famotidine  20 mg Oral BID  . levETIRAcetam  500 mg Oral BID  . lidocaine  2 patch Transdermal Q24H  . metoprolol tartrate  25 mg Oral BID  . risperiDONE  0.5 mg Oral QHS  . senna-docusate  2 tablet Oral QHS    OBJECTIVE  Filed Vitals:   05/29/13 0548 05/29/13 1458 05/30/13 0436 05/30/13 0500  BP: 132/68 105/66  146/63  Pulse: 81 62  69  Temp:  98 F (36.7 C)  97.9 F (36.6 C)  TempSrc:  Oral  Oral  Resp: 18 18  18   Weight: 154 lb (69.854 kg)  156 lb 8.4 oz (71 kg)   SpO2: 98% 98%  94%    Intake/Output Summary (Last 24 hours) at 05/30/13 0909 Last data filed at 05/29/13 1300  Gross per 24 hour  Intake     20 ml  Output      0 ml  Net     20 ml   Filed Weights   05/28/13 0557 05/29/13 0548 05/30/13 0436  Weight: 159 lb 2.8 oz (72.2 kg) 154 lb (69.854 kg) 156 lb 8.4 oz (71 kg)    PHYSICAL EXAM  General: Pleasant, NAD. Neuro: Alert and oriented X 3. Moves all extremities spontaneously. Psych: Normal affect. HEENT:  Normal  Neck: Supple without bruits or  JVD. Lungs:  Resp regular and unlabored, CTA. Heart: RRR no s3, s4, or murmurs. Abdomen: Soft, non-tender, non-distended, BS + x 4.  Extremities: No clubbing, cyanosis or edema. DP/PT/Radials 2+ and equal bilaterally.  Accessory Clinical Findings  Basic Metabolic Panel  Recent Labs  05/27/13 2325  NA 136  K 3.8  CL 100  CO2 26  GLUCOSE 116*  BUN 20  CREATININE 0.97  CALCIUM 8.7   Cardiac Enzymes  Recent Labs  05/27/13 2325  TROPONINI <0.30    TELE  Not on tele.  ECG (12/5)  A paced, V sensed, 62, lad, prolonged QT (no change from prior).  Radiology/Studies  Ct Head Wo Contrast  05/28/2013   CLINICAL DATA:  Fall  EXAM: CT HEAD WITHOUT CONTRAST    IMPRESSION: 1. No new intracranial hemorrhage or other process identified status post fall. 2. Slight interval decrease in size of left frontal lobe hematoma now measuring 2.5 x 3.3 cm (previously 3.6 x 2.6 cm). No midline shift or hydrocephalus.   Electronically Signed   By: Rise Mu M.D.   On: 05/28/2013 00:08   ASSESSMENT AND  PLAN  1.  ICH:  Per rehab team.  No anticoagulation (prev on xarelto).  2.  Syncope:  Occurred Friday 12/5 while sitting in chair.  Pt apparently slumped, became unresponsive, and apneic.  Regained consciousness once placed back in bed.  Will have MDT rep interrogate PPM to assess for proper function and potential for high rate rhythms from that night.  BPs and HRs recorded in VS were normal surrounding that episode.  Review of meds shows that she had received Seroquel, Keppra, Metoprolol, and Ultram within 90 mins of that event.    3.  PAF:  Regular on exam  Cont amio.  No anticoagulation in setting of ICH.  MDT rep to interrogate device to assess for any abnormalities over the weekend.  4.  Right lower back/hip/buttock pain:  Per Rehab team.  5.   permanent pacemaker    This will be interrogated       Signed, Nicolasa Ducking NP Patient seen and examined. I agree with the  assessment and plan as detailed above. See also my additional thoughts below.   The etiology of the event last Friday night is not clear. The patient has been stable since that time. The pacemaker will be interrogated. Cardiology will follow  Willa Rough, MD, [facility] 05/30/2013 12:43 PM

## 2013-05-30 NOTE — Progress Notes (Signed)
Occupational Therapy Session Note  Patient Details  Name: MURIELLE STANG MRN: [id number] Date of Birth: 1930/01/26  Today's Date: 05/30/2013  Short Term Goals: Week 1:  OT Short Term Goal 1 (Week 1): STG=LTG  Skilled Therapeutic Interventions/Progress Updates:    Pt missed 60 mins skilled OT services.  Pt asleep upon arrival and initially was easy to arouse.  Pt required max A to sit EOB and patient started groaning and grimacing but would not verbalize that she was in any pain.  Pt laid back onto bed and covered up.  MD arrived and patient would not respond to MD's questions.  Pt remained in bed with bed alarm set.  Therapy Documentation Precautions:  Precautions Precautions: Fall Restrictions Weight Bearing Restrictions: No General: General Amount of Missed OT Time (min): 60 Minutes  See FIM for current functional status  Therapy/Group: Individual Therapy  Rich Brave 05/30/2013, 7:36 AM

## 2013-05-30 NOTE — Consult Note (Addendum)
NEUROCOGNITIVE TESTING - [facility]   Ms. Laurana Magistro is an 79 year old woman, who was seen for a brief neuropsychological evaluation to assess her emotional and cognitive functioning post-intracranial hemorrhage.  According to her medical record, she was admitted on 05/18/13 with speech difficulty, right facial droop, and seizure.  CT of the head revealed left frontal lobe intraparenchymal hematoma with intraventricular hemorrhage within the left lateral ventricle.  She was in acute respiratory distress due to pulmonary edema.  Anticoagulation reversed per [facility] protocol.  Follow-up CCT on 11/27 demonstrated left frontal hematoma and vasogenic edema.    PROCEDURES: [3 units of 40981 on 05/26/13]  The following tests were performed during today's visit: Mini Mental Status Examination (brief version), Repeatable Battery for the Assessment of Neuropsychological Status (RBANS, form A), Geriatric Anxiety Inventory, and the Geriatric Depression Scale (short form).  Test results are as follows:   MMSE-2 brief Raw Score = 10/16 Description = Impaired    RBANS Indices Scaled Score Percentile Description  Immediate Memory  44 < 1 Profoundly Impaired  Visuospatial/Constructional 81 10 Below Average  Language 89 23 Below Average  Attention 53 < 1 Profoundly Impaired  Delayed Memory 48 < 1 Profoundly Impaired  Total Score 54 < 1 Profoundly Impaired   RBANS Subtests Raw Score Percentile Description  List Learning 7 < 1 Profoundly Impaired  Story Memory 3 < 1 Profoundly Impaired  Figure Copy 14 5 Impaired  Line Orientation 15 39 Average  Picture Naming 9 45 Average  Semantic Fluency 11 4 Impaired  Digit Span 6 7 Impaired  Coding 6 < 1 Profoundly Impaired  List Recall 0 < 1 Profoundly Impaired  List Recognition 10 < 1 Profoundly Impaired  Story Recall 1 1 Profoundly Impaired  Figure recall 5 5 Impaired   Geriatric Depression Scale (short form) Raw Score =  12/15 Description = Moderate-Severe   Geriatric Anxiety Inventory Raw Score = 13/20 Description = Clinical Range   Test results revealed significant impairments across cognitive domains, but with relatively preserved ability to make fine visual distinctions and ability for simple confrontation naming.  It is likely that her cognitive deficits represent direct sequelae from her hematoma and associated hemorrhage.  However, given that details from the precipitating event (e.g. head injury) are unknown, it is difficult to discern her cognitive prognosis.  Still, some level of cognitive recovery over time would be expected.  Although there was also evidence of moderate-severe depression and clinically significant anxiety and therefore, mood disruption could be exacerbating her cognitive deficits, it is unlikely that mood disruption is the sole driving force behind her cognitive impairments.  Still, if her depression and anxiety levels improve, she may notice improvement in cognitive abilities as well.  Therefore, her physician may consider adjusting her medication regimen for more effective treatment of mood disruption.  Serial re-evaluation in 6-12 months would be useful in assessing for interval change as she recovers from the hematoma and hemorrhage.    In light of these findings, the following recommendations are provided.    RECOMMENDATIONS:  Recommendations for treatment team:     Ms. Layson' physician may consider evaluating the efficacy of her current medication regimen to see if alterations would be medically indicated to improve mood.     Supportive therapy while on the unit is recommended for management of mood disruption.  I will plan to see her in follow-up for this purpose.     When interacting with Ms. Zalar, directions and information should be  provided in a simple, straight forward manner, and the treatment team should avoid giving multiple instructions simultaneously.    Ms. Cashion may  also benefit from being provided with multiple trials to learn new skills given the noted memory inefficiencies.    To the extent possible, multitasking should be avoided.   Ms. Perfecto requires more time than typical to process information. The treatment team may benefit from waiting for a verbal response to information before presenting additional information.    Performance will generally be best in a structured, routine, and familiar environment, as opposed to situations involving complex problems.   Recommendations for discharge planning:    Complete a comprehensive neuropsychological evaluation as an outpatient in 6-12 months to assess for interval change.     Maintain engagement in mentally, physically and cognitively stimulating activities.    Strive to maintain a healthy lifestyle (e.g., proper diet and exercise) in order to promote physical, cognitive and emotional health.   Leavy Cella, Psy.D.  Clinical Neuropsychologist

## 2013-05-30 NOTE — Progress Notes (Signed)
Subjective/Complaints: Became agitated last night. Refused meds. Ativan given IM to calm her down. Had large BM afterward. Refuses to participate in therapies this am.  A 12 point review of systems has been performed and if not noted above is otherwise negative.   Objective: Vital Signs: Blood pressure 146/63, pulse 69, temperature 97.9 F (36.6 C), temperature source Oral, resp. rate 18, weight 71 kg (156 lb 8.4 oz), SpO2 94.00%. Dg Ankle Complete Right  05/28/2013   CLINICAL DATA:  Pain, possible history of fall  EXAM: RIGHT ANKLE - COMPLETE 3+ VIEW  COMPARISON:  None.  FINDINGS: Three views of the right ankle reveal the joint mortise to be preserved. The talar dome is intact. There is no evidence of an acute malleolar fracture. There are mild degenerative changes of the tip of the medial malleolus. There is mild soft tissue swelling diffusely. The talus and calcaneus appear intact. There are plantar and Achilles region calcaneal spurs.  IMPRESSION: There is no evidence of an acute fracture nor dislocation of the right ankle. There is irregularity of the contour of the medial malleolus which does not appear to reflect an acute fracture but could reflect an old fracture or or other old injury. There is mild soft tissue swelling diffusely.   Electronically Signed   By: David  [place]   On: 05/28/2013 11:56   No results found for this basename: WBC, HGB, HCT, PLT,  in the last 72 hours  Recent Labs  05/27/13 2325  NA 136  K 3.8  CL 100  GLUCOSE 116*  BUN 20  CREATININE 0.97  CALCIUM 8.7   CBG (last 3)  No results found for this basename: GLUCAP,  in the last 72 hours  Wt Readings from Last 3 Encounters:  05/30/13 71 kg (156 lb 8.4 oz)  05/24/13 78.291 kg (172 lb 9.6 oz)  04/06/13 74.481 kg (164 lb 3.2 oz)    Physical Exam:  Constitutional: She is oriented to person, place, and time. She appears well-developed and well-nourished.  HENT:  Head: Normocephalic and atraumatic.   Eyes: Conjunctivae and EOM are normal. Pupils are equal, round, and reactive to light. Right eye exhibits no discharge. Left eye exhibits no discharge. No scleral icterus.  Neck: Normal range of motion. Neck supple.  Cardiovascular: Normal rate and regular rhythm. Exam reveals no gallop and no friction rub.  No murmur heard.  Respiratory: Effort normal. No respiratory distress. She has no wheezes.  GI: Soft. Bowel sounds are normal. She exhibits no distension. There is no tenderness.  Musculoskeletal: She exhibits no edema. Tenderness with palpation of the lower lumbar spine and ito bilateral buttocks, appeared less intense. Right ankle tender with ROM--non-specific  Neurological: She is drowsy, distracted, confused. Limited insight and awareness Right facial weakness. Speech with dysarthria. Language is aphasic. Sometime language of confusion? slow to arouse this am.. . Mild right sided weakness,  No gross sensory changes.    Skin: Skin is warm and dry.  A few limb bruises noted  Psychiatric: doesn't want to answer questions or participate in therapy. Appears slightly lethargic   Assessment/Plan: 1. Functional deficits secondary to left frontal hematoma which require 3+ hours per day of interdisciplinary therapy in a comprehensive inpatient rehab setting. Physiatrist is providing close team supervision and 24 hour management of active medical problems listed below. Physiatrist and rehab team continue to assess barriers to discharge/monitor patient progress toward functional and medical goals. FIM: FIM - Bathing Bathing Steps Patient Completed: Chest;Right Arm;Left Arm;Abdomen Bathing:  3: Mod-Patient completes 5-7 7f 10 parts or 50-74%  FIM - Upper Body Dressing/Undressing Upper body dressing/undressing steps patient completed: Thread/unthread right bra strap;Thread/unthread left bra strap;Hook/unhook bra;Thread/unthread right sleeve of pullover shirt/dresss;Thread/unthread left sleeve of  pullover shirt/dress;Put head through opening of pull over shirt/dress;Pull shirt over trunk Upper body dressing/undressing: 0: Wears gown/pajamas-no public clothing FIM - Lower Body Dressing/Undressing Lower body dressing/undressing steps patient completed: Thread/unthread right underwear leg;Thread/unthread left underwear leg;Pull underwear up/down;Thread/unthread right pants leg;Thread/unthread left pants leg;Pull pants up/down;Don/Doff right sock;Don/Doff left sock Lower body dressing/undressing: 0: Wears gown/pajamas-no public clothing  FIM - Toileting Toileting steps completed by patient: Adjust clothing prior to toileting;Performs perineal hygiene;Adjust clothing after toileting Toileting Assistive Devices: Grab bar or rail for support Toileting: 1: Total-Patient completed zero steps, helper did all 3  FIM - Diplomatic Services operational officer Devices: Grab bars Toilet Transfers: 1-Two helpers  FIM - Banker Devices: Bed rails Bed/Chair Transfer: 2: Sit > Supine: Max A (lifting assist/Pt. 25-49%);1: Two helpers  FIM - Locomotion: Printmaker: Wheelchair: 3: Travels 150 ft or more: maneuvers on rugs and over door sills with moderate assistance  (Pt: 50 - 74%) FIM - Locomotion: Ambulation Ambulation/Gait Assistance: 4: Min guard;4: Min assist Locomotion: Ambulation: 0: Activity did not occur  Comprehension Comprehension Mode: Auditory Comprehension: 2-Understands basic 25 - 49% of the time/requires cueing 51 - 75% of the time  Expression Expression Mode: Verbal Expression: 1-Expresses basis less than 25% of the time/requires cueing greater than 75% of the time.  Social Interaction Social Interaction: 2-Interacts appropriately 25 - 49% of time - Needs frequent redirection.  Problem Solving Problem Solving: 1-Solves basic less than 25% of the time - needs direction nearly all the time or does not effectively solve  problems and may need a restraint for safety  Memory Memory: 2-Recognizes or recalls 25 - 49% of the time/requires cueing 51 - 75% of the time  Medical Problem List and Plan:  ICH with seizure activity--both CT's done last night showed improvement  -Recheck CT later this week 1. DVT Prophylaxis/Anticoagulation: Pharmaceutical: Lovenox  2. Pain Management: Continue tylenol prn for headaches. Added muscle relaxers for exacerbation of chronic back pain  -lumbar xrays show spondylosis, no fx or substantial ddd  -kpad for back. Need to limit narcs to avoid excessive confusion   -lidoderm patches for low back qhs prn added.  -ankle xray negative 3. H/o depression/Mood: risperdal trial, add lexapro for depression. Pt is not happy being here but remains cognitively and linguistically impaired.   -psychology follow up 4. Neuropsych: This patient is not capable of making decisions on her own behalf at this moment  5. A fib with PPM: Continue amiodarone and metoprolol. No anticoagulation at this time and f/u with cards in [place].   -cards follow up requested given ?syncopal episode Friday night 7. New onset seizure: on keppra bid--check level tomorrow 8. Aspiration PNA: continue levaquin, cxr better--dc abx 9. Unresponsive episode: ?syncopal  -work up negative    -continue observation closely  -see above  LOS (Days) 6 A FACE TO FACE EVALUATION WAS PERFORMED  SWARTZ,ZACHARY T 05/30/2013 8:22 AM

## 2013-05-30 NOTE — Progress Notes (Signed)
Speech Language Pathology Daily Session Note  Patient Details  Name: Ann Kaufman MRN: [id number] Date of Birth: 1929/07/25  Today's Date: 05/30/2013 Time: 4098-1191 Time Calculation (min): 35 min  Short Term Goals: Week 1: SLP Short Term Goal 1 (Week 1): Pt will demonstrate functional problem solving for mildly complex tasks with Min A verbal and visual cues. SLP Short Term Goal 2 (Week 1): Pt will utilize external memory aids to recall new, daily information with supervision verbal cues.  SLP Short Term Goal 3 (Week 1): Pt will self-monitor and correct verbal errors with supervision verbal and question cues.  SLP Short Term Goal 4 (Week 1): Pt will demonstrate appropriate safety awareness and request assistance with supervision question cues.   Skilled Therapeutic Interventions: Treatment focus on cognitive goals and education with pt's daughter. Upon entering, pt asleep while supine in bed. Pt aroused with verbal and tactile cues and agreed to attempt to consume breakfast. Pt was unable to sit up past 30 degrees due to back pain and was moaning. RN made aware and pt repositioned.  Attempted to provide oral care, however, unsuccessful due to positioning.  Provided education to pt's daughter in regards to current cognitive-linguistic impairment, pt's daughter asking appropriate questions. Physician made aware and provided education in regards to pt's current medication status. Pt left in supine position with bed alarm on and daughter in room.    FIM:  Comprehension Comprehension: 1-Understands basic less than 25% of the time/requires cueing 75% of the time Expression Expression Mode: Verbal Expression: 1-Expresses basis less than 25% of the time/requires cueing greater than 75% of the time. Social Interaction Social Interaction: 1-Interacts appropriately less than 25% of the time. May be withdrawn or combative. Problem Solving Problem Solving: 1-Solves basic less than 25% of the time -  needs direction nearly all the time or does not effectively solve problems and may need a restraint for safety Memory Memory: 1-Recognizes or recalls less than 25% of the time/requires cueing greater than 75% of the time  Pain Pain Assessment Pain Assessment: Faces Faces Pain Scale: Hurts little more Pain Type: Acute pain Pain Location: Back Pain Orientation: Lower Pain Frequency: Constant Pain Onset: On-going Patients Stated Pain Goal: 4 Pain Intervention(s): Medication (See eMAR);Repositioned;Emotional support;Heat applied Multiple Pain Sites: No  Therapy/Group: Individual Therapy  Mika Griffitts 05/30/2013, 12:56 PM

## 2013-05-31 ENCOUNTER — Encounter (HOSPITAL_COMMUNITY): Payer: Medicare Other

## 2013-05-31 ENCOUNTER — Inpatient Hospital Stay (HOSPITAL_COMMUNITY): Payer: Medicare Other

## 2013-05-31 ENCOUNTER — Inpatient Hospital Stay (HOSPITAL_COMMUNITY): Payer: Medicare Other | Admitting: Speech Pathology

## 2013-05-31 DIAGNOSIS — J159 Unspecified bacterial pneumonia: Secondary | ICD-10-CM

## 2013-05-31 DIAGNOSIS — I4891 Unspecified atrial fibrillation: Secondary | ICD-10-CM

## 2013-05-31 DIAGNOSIS — I619 Nontraumatic intracerebral hemorrhage, unspecified: Secondary | ICD-10-CM

## 2013-05-31 DIAGNOSIS — I10 Essential (primary) hypertension: Secondary | ICD-10-CM

## 2013-05-31 MED ORDER — LEVETIRACETAM 750 MG PO TABS
750.0000 mg | ORAL_TABLET | Freq: Two times a day (BID) | ORAL | Status: DC
  Administered 2013-05-31 – 2013-06-02 (×5): 750 mg via ORAL
  Filled 2013-05-31 (×8): qty 1

## 2013-05-31 MED ORDER — SODIUM CHLORIDE 0.45 % IV SOLN
INTRAVENOUS | Status: DC
  Administered 2013-05-31 – 2013-06-02 (×2): 1000 mL via INTRAVENOUS

## 2013-05-31 MED ORDER — LORAZEPAM 2 MG/ML IJ SOLN: 1.0000 mg | Freq: Four times a day (QID) | INTRAMUSCULAR | Status: DC | PRN

## 2013-05-31 MED ORDER — RISPERIDONE 0.5 MG PO TABS
0.5000 mg | ORAL_TABLET | Freq: Every day | ORAL | Status: DC
  Administered 2013-05-31: 0.5 mg via ORAL
  Filled 2013-05-31 (×2): qty 1

## 2013-05-31 MED ORDER — OXYCODONE HCL 5 MG PO TABS
5.0000 mg | ORAL_TABLET | Freq: Three times a day (TID) | ORAL | Status: DC
  Administered 2013-05-31 – 2013-06-02 (×5): 5 mg via ORAL
  Filled 2013-05-31 (×4): qty 1

## 2013-05-31 MED ORDER — LORAZEPAM 1 MG PO TABS
1.0000 mg | ORAL_TABLET | Freq: Four times a day (QID) | ORAL | Status: DC | PRN
  Administered 2013-05-31: 1 mg via ORAL
  Filled 2013-05-31: qty 1

## 2013-05-31 NOTE — Progress Notes (Signed)
Occupational Therapy Session Note  Patient Details  Name: Ann Kaufman MRN: [id number] Date of Birth: 1930/06/20  Today's Date: 05/31/2013 Time: 0700-0800 Time Calculation (min): 60 min  Short Term Goals: Week 1:  OT Short Term Goal 1 (Week 1): STG=LTG  Skilled Therapeutic Interventions/Progress Updates:    Pt asleep in bed upon arrival but easily aroused.  Pt not oriented to place, date, or situation.  Pt agreeable to bathing and dressing at sink this morning.  Pt initially performed sit<>stand with min A but progressively required increased assistance with sit<>stand secondary to increased pain when standing.  Pt required min verbal cues for task initiation.  Pt expressed increasing pain with activity but continued to actively participate with tasks.  Pt transferred to recliner for comfort at end of session with QRB in place and SLP entering room for therapy.  Focus on activity tolerance, orientation, task initiation, sequencing, sit<>stand, standing balance, and safety awareness.  Therapy Documentation Precautions:  Precautions Precautions: Fall Restrictions Weight Bearing Restrictions: No   Pain: Pain Assessment Pain Assessment: Faces Faces Pain Scale: Hurts even more Pain Location: Leg Pain Orientation: Right Pain Descriptors / Indicators: Aching;Shooting Pain Intervention(s): RN made aware;Repositioned  See FIM for current functional status  Therapy/Group: Individual Therapy  Rich Brave 05/31/2013, 11:24 AM

## 2013-05-31 NOTE — Progress Notes (Signed)
Speech Language Pathology Daily Session Note  Patient Details  Name: Ann Kaufman MRN: [id number] Date of Birth: [birth year]  Today's Date: 05/31/2013 Time: 0800-0845 Time Calculation (min): 45 min  Short Term Goals: Week 1: SLP Short Term Goal 1 (Week 1): Pt will demonstrate functional problem solving for mildly complex tasks with Min A verbal and visual cues. SLP Short Term Goal 2 (Week 1): Pt will utilize external memory aids to recall new, daily information with supervision verbal cues.  SLP Short Term Goal 3 (Week 1): Pt will self-monitor and correct verbal errors with supervision verbal and question cues.  SLP Short Term Goal 4 (Week 1): Pt will demonstrate appropriate safety awareness and request assistance with supervision question cues.   Skilled Therapeutic Interventions: Treatment focus on cognitive goals. Upon arrival, pt alert but in visible pain while sitting up in recliner. RN aware and medication were given as well as pt was repositioned. SLP facilitated session by providing supervision question cues for functional problem solving with extra time for tray set-up and pt consumed breakfast of regular textures with thin liquids independently and demonstrated selective attention to task for ~20 minutes with supervision question cues for redirection.  Pt demonstrated increased word-finding at the sentence level with increased ability to form coherent and functional information, however, pt continues to require Max-Total A to self-monitor and correct verbal errors when they occur.  Pt also requires Max A multimodal cueing for orientation to place, time and situation but overall functionally improved compared to yesterday's session. Continue plan of care.    FIM:  Comprehension Comprehension Mode: Auditory Comprehension: 4-Understands basic 75 - 89% of the time/requires cueing 10 - 24% of the time Expression Expression Mode: Verbal Expression: 4-Expresses basic 75 - 89% of the  time/requires cueing 10 - 24% of the time. Needs helper to occlude trach/needs to repeat words. Social Interaction Social Interaction: 3-Interacts appropriately 50 - 74% of the time - May be physically or verbally inappropriate. Problem Solving Problem Solving: 2-Solves basic 25 - 49% of the time - needs direction more than half the time to initiate, plan or complete simple activities Memory Memory: 2-Recognizes or recalls 25 - 49% of the time/requires cueing 51 - 75% of the time FIM - Eating Eating Activity: 6: Swallowing techniques: self-managed  Pain Pain Assessment Pain Assessment: Faces Faces Pain Scale: Hurts even more Pain Location: Leg Pain Orientation: Right Pain Descriptors / Indicators: Aching;Shooting Pain Intervention(s): RN made aware;Repositioned  Therapy/Group: Individual Therapy  Elvera Almario 05/31/2013, 2:23 PM

## 2013-05-31 NOTE — Progress Notes (Signed)
Physical Therapy Session Note  Patient Details  Name: Ann Kaufman MRN: [id number] Date of Birth: 27-Oct-1929  Today's Date: 05/31/2013 Time: Treatment Session 1: 1005-1105; Treatment Session 2: 1300-1330 Time Calculation (min): Treatment Session 1: 60 min; Treatment Session 2:  Short Term Goals: Week 1:  PT Short Term Goal 1 (Week 1): STGs=LTGs due to anticipated LOS  Skilled Therapeutic Interventions/Progress Updates:  Treatment Session 1:  1:1. Pt received sitting in recliner, lightly sleeping but easy to wake. Pt's daughter in room, educated on pt's current progress to date as well as daily/long term goals. Pt req min A for SPT w/c<>toilet and steadying assist for managing clothing/pericare at start of tx session. Focus this session on trial standing functional mobility w/ use of RW to promote flexed trunk posture for pain relief. Pt continues to demonstrate highest levels of pain during transitional movements, specifically t/f sit>stand as it promotes trunk extension. Pt able to amb 100'x3 w/ RW and min A for management of RW on unit and off unit in gift shop for emphasis on obstacle negotiation and attending to objects for pain distraction. Pt w/ difficulty orienting to time this session. Stretching performed to B LE hamstrings during seated rest breaks. Pt able to propel w/c >150' w/ B LE and min A for this session w/ good tolerance. Pt sitting in recliner at end of session w/ bolsters under B LE for increased elevation for pain relief at end of session w/ all needs in reach and quick release belt in place. Daughter in room.   Treatment Session 2: 1:1. Pt received supine in bed resting. Pt demonstrating decreased orientation to time, place and situation this session vs. AM, focus on reorientation. Pt often trying to cover up lack of orientation by sarcasm. Pt only agreeable to get OOB last of tx session to use bathroom. Pt able to amb 15'x2 bed<>bathroom w/ RW. Pt req min A  overall for bed mob, amb, toilet t/f and steadying assist while managing clothing/pericare. Pt supine in bed at end of session w/ all needs in reach, bed alarm on. RN aware of pt's decreased orientation noted this session.    Therapy Documentation Precautions:  Precautions Precautions: Fall Restrictions Weight Bearing Restrictions: No Pain: Pain Assessment Pain Assessment: Faces Faces Pain Scale: Hurts even more Pain Location: Leg Pain Orientation: Right Pain Descriptors / Indicators: Aching;Shooting Pain Intervention(s): RN made aware;Repositioned Locomotion : Ambulation Ambulation/Gait Assistance: 4: Min guard;4: Min assist   See FIM for current functional status  Therapy/Group: Individual Therapy  Denzil Hughes 05/31/2013, 12:35 PM

## 2013-05-31 NOTE — Progress Notes (Signed)
Recreational Therapy Session Note  Patient Details  Name: ELVERA ALMARIO MRN: [id number] Date of Birth: 06-01-1930 Today's Date: 05/31/2013  Pain: c/o pain, unrated, RN aware Skilled Therapeutic Interventions/Progress Updates: Session focused on activity tolerance, ambulation with RW,& overall safety during ambulation while negotiating obstacles using RW with min A.  Pt's daughter present and observing therapy.  Therapy/Group: Co-Treatment   Rennie Rouch 05/31/2013, 4:27 PM

## 2013-05-31 NOTE — Progress Notes (Signed)
Nursing Note; Pt slept all night.wbb

## 2013-05-31 NOTE — Progress Notes (Signed)
Subjective/Complaints: Had a much better night. Very alert this am. Beginning to work with OT. Denies that her low back bothers her today  A 12 point review of systems has been performed and if not noted above is otherwise negative.   Objective: Vital Signs: Blood pressure 130/65, pulse 62, temperature 98.3 F (36.8 C), temperature source Oral, resp. rate 17, weight 73.1 kg (161 lb 2.5 oz), SpO2 96.00%. Ct Lumbar Spine Wo Contrast  05/30/2013   CLINICAL DATA:  Low back tenderness extending into the buttocks. Several falls. Recent stroke.  EXAM: CT LUMBAR SPINE WITHOUT CONTRAST  TECHNIQUE: Multidetector CT imaging of the lumbar spine was performed without intravenous contrast administration. Multiplanar CT image reconstructions were also generated.  COMPARISON:  Lumbar spine radiographs 05/25/2013. Chest CT 11/26/2011.  FINDINGS: There are 5 lumbar type vertebral bodies demonstrating a mild convex right scoliosis. The lateral alignment is normal. There is no evidence of fracture or pars defect.  No paraspinal abnormalities are identified. Two low-density left renal lesions (probable cysts) and a nonobstructing left renal calculus are noted. There is no hydronephrosis.  There anterior paraspinal osteophytes at T12-L1 and L1-2, but no spinal stenosis or nerve root encroachment.  L2-3: Mild disc bulging with mild facet and ligamentous hypertrophy. No significant spinal stenosis or nerve root encroachment.  L3-4: Disc bulging, facet and ligamentous hypertrophy contribute to mild central stenosis. The foramina appear sufficiently patent.  L4-5: Disc bulging, facet and ligamentous hypertrophy contribute to mild central stenosis. The foramina appear sufficiently patent.  L5-S1: Mild disc bulging with facet and ligamentous hypertrophy. No significant spinal stenosis or nerve root encroachment.  IMPRESSION: 1. No acute osseous findings. The alignment is normal aside from a mild scoliosis. 2. Mild disc bulging,  facet and ligamentous hypertrophy as described. There is only mild central stenosis at L3-4 and L4-5. No foraminal compromise or nerve root encroachment identified. 3. Probable left renal cysts and a nonobstructing left renal calculus are noted incidentally.   Electronically Signed   By: Roxy Horseman M.D.   On: 05/30/2013 10:48    Recent Labs  05/30/13 1030  WBC 6.4  HGB 11.0*  HCT 33.3*  PLT 198    Recent Labs  05/30/13 1030  NA 136  K 3.6  CL 101  GLUCOSE 104*  BUN 14  CREATININE 0.85  CALCIUM 8.1*   CBG (last 3)  No results found for this basename: GLUCAP,  in the last 72 hours  Wt Readings from Last 3 Encounters:  05/31/13 73.1 kg (161 lb 2.5 oz)  05/24/13 78.291 kg (172 lb 9.6 oz)  04/06/13 74.481 kg (164 lb 3.2 oz)    Physical Exam:  Constitutional: She is oriented to person, place, and time. She appears well-developed and well-nourished.  HENT:  Head: Normocephalic and atraumatic.  Eyes: Conjunctivae and EOM are normal. Pupils are equal, round, and reactive to light. Right eye exhibits no discharge. Left eye exhibits no discharge. No scleral icterus.  Neck: Normal range of motion. Neck supple.  Cardiovascular: Normal rate and regular rhythm. Exam reveals no gallop and no friction rub.  No murmur heard.  Respiratory: Effort normal. No respiratory distress. She has no wheezes.  GI: Soft. Bowel sounds are normal. She exhibits no distension. There is no tenderness.  Musculoskeletal: She exhibits no edema. Minimal low back pain today at rest  Neurological: She is drowsy, distracted, confused. Limited insight and awareness Right facial weakness. Speech with dysarthria. Language is aphasic. Sometime language of confusion? Extremely alert. Follows simple  commands.  Skin: Skin is warm and dry.  A few limb bruises noted  Psychiatric: confused, calm/non-agitated   Assessment/Plan: 1. Functional deficits secondary to left frontal hematoma which require 3+ hours per day  of interdisciplinary therapy in a comprehensive inpatient rehab setting. Physiatrist is providing close team supervision and 24 hour management of active medical problems listed below. Physiatrist and rehab team continue to assess barriers to discharge/monitor patient progress toward functional and medical goals.  FIM: FIM - Bathing Bathing Steps Patient Completed: Chest;Right Arm;Left Arm;Abdomen Bathing: 3: Mod-Patient completes 5-7 52f 10 parts or 50-74%  FIM - Upper Body Dressing/Undressing Upper body dressing/undressing steps patient completed: Thread/unthread right bra strap;Thread/unthread left bra strap;Hook/unhook bra;Thread/unthread right sleeve of pullover shirt/dresss;Thread/unthread left sleeve of pullover shirt/dress;Put head through opening of pull over shirt/dress;Pull shirt over trunk Upper body dressing/undressing: 0: Wears gown/pajamas-no public clothing FIM - Lower Body Dressing/Undressing Lower body dressing/undressing steps patient completed: Thread/unthread right underwear leg;Thread/unthread left underwear leg;Pull underwear up/down;Thread/unthread right pants leg;Thread/unthread left pants leg;Pull pants up/down;Don/Doff right sock;Don/Doff left sock Lower body dressing/undressing: 0: Wears gown/pajamas-no public clothing  FIM - Toileting Toileting steps completed by patient: Adjust clothing prior to toileting;Performs perineal hygiene;Adjust clothing after toileting Toileting Assistive Devices: Grab bar or rail for support Toileting: 1: Total-Patient completed zero steps, helper did all 3  FIM - Diplomatic Services operational officer Devices: Grab bars Toilet Transfers: 1-Two helpers  FIM - Banker Devices: Bed rails Bed/Chair Transfer: 2: Sit > Supine: Max A (lifting assist/Pt. 25-49%);1: Two helpers  FIM - Locomotion: Printmaker: Wheelchair: 3: Travels 150 ft or more: maneuvers on rugs and over door sills  with moderate assistance  (Pt: 50 - 74%) FIM - Locomotion: Ambulation Ambulation/Gait Assistance: 4: Min guard;4: Min assist Locomotion: Ambulation: 0: Activity did not occur  Comprehension Comprehension Mode: Auditory Comprehension: 2-Understands basic 25 - 49% of the time/requires cueing 51 - 75% of the time  Expression Expression Mode: Verbal Expression: 2-Expresses basic 25 - 49% of the time/requires cueing 50 - 75% of the time. Uses single words/gestures.  Social Interaction Social Interaction: 2-Interacts appropriately 25 - 49% of time - Needs frequent redirection.  Problem Solving Problem Solving: 1-Solves basic less than 25% of the time - needs direction nearly all the time or does not effectively solve problems and may need a restraint for safety  Memory Memory: 1-Recognizes or recalls less than 25% of the time/requires cueing greater than 75% of the time  Medical Problem List and Plan:  ICH with seizure activity--both CT's done last night showed improvement  -Recheck CT later this week 1. DVT Prophylaxis/Anticoagulation: Pharmaceutical: Lovenox  2. Pain Management: CT of lumbar spine shows multilevel spondylosis----mild stenosis. No fractures  -scheduled lidoderm patches  -oxycodone for pain, tylenol also  -k pad  -NEED TO BE OOB. 3. H/o depression/Mood: risperdal trial, added lexapro for depression. Pt is not happy being here but remains cognitively and linguistically impaired.   -slept MUCH better last night. Hope this will be the trend 4. Neuropsych: This patient is not capable of making decisions on her own behalf at this moment  5. A fib with PPM: Continue amiodarone and metoprolol. No anticoagulation at this time and f/u with cards in [place].   -appreciate cards follow up requested given--pacer interrogation in regard to Friday's unresponsive episode 7. New onset seizure: on keppra bid--level therapeutic 8. Aspiration PNA: continue levaquin, cxr better--dc'ed  abx   LOS (Days) 7 A FACE TO FACE EVALUATION WAS  PERFORMED  Abryana Lykens T 05/31/2013 8:08 AM

## 2013-06-01 ENCOUNTER — Inpatient Hospital Stay (HOSPITAL_COMMUNITY): Payer: Medicare Other

## 2013-06-01 ENCOUNTER — Inpatient Hospital Stay (HOSPITAL_COMMUNITY): Payer: Medicare Other | Admitting: Speech Pathology

## 2013-06-01 ENCOUNTER — Encounter (HOSPITAL_COMMUNITY): Payer: Medicare Other

## 2013-06-01 DIAGNOSIS — I4891 Unspecified atrial fibrillation: Secondary | ICD-10-CM

## 2013-06-01 DIAGNOSIS — J159 Unspecified bacterial pneumonia: Secondary | ICD-10-CM

## 2013-06-01 DIAGNOSIS — I10 Essential (primary) hypertension: Secondary | ICD-10-CM

## 2013-06-01 DIAGNOSIS — I619 Nontraumatic intracerebral hemorrhage, unspecified: Secondary | ICD-10-CM

## 2013-06-01 LAB — AMIODARONE LEVEL
Amiodarone Lvl: 1.4 ug/mL — ABNORMAL LOW (ref 1.5–2.5)
N-Desethyl-Amiodarone: 1.2 ug/mL — ABNORMAL LOW (ref 1.5–2.5)

## 2013-06-01 MED ORDER — LORAZEPAM 0.5 MG PO TABS
0.5000 mg | ORAL_TABLET | Freq: Four times a day (QID) | ORAL | Status: DC | PRN
  Filled 2013-06-01 (×2): qty 1

## 2013-06-01 MED ORDER — LORAZEPAM 2 MG/ML IJ SOLN
0.5000 mg | Freq: Four times a day (QID) | INTRAMUSCULAR | Status: DC | PRN
  Administered 2013-06-08: 0.5 mg via INTRAMUSCULAR
  Filled 2013-06-01: qty 1

## 2013-06-01 MED ORDER — RISPERIDONE 0.5 MG PO TABS
0.5000 mg | ORAL_TABLET | Freq: Every day | ORAL | Status: DC
  Administered 2013-06-01: 0.5 mg via ORAL
  Filled 2013-06-01 (×2): qty 1

## 2013-06-01 NOTE — Progress Notes (Signed)
Occupational Therapy Note  Patient Details  Name: Ann Kaufman MRN: [id number] Date of Birth: January 31, 1930 Today's Date: 06/01/2013  Time: 1300-1345 Pt denies pain Individual therapy  Pt asleep in bed upon arrival but easily aroused.  When awaken pt stated "I didn't know it was so late.  I will be late for work."  Pt required max verbal cues for orientation to place, date, and situation throughout session.  Pt initially engaged in bed mobility, sitting balance EOB, and transfer to recliner.  Pt engaged in self feeding with focus on task initiation and attention to task.  Pt's conversation throughout session was nonsensical with incorrect/inappropriate usage of words.  Pt did not exhibit any awareness of her incorrect usage of words and often became annoyed when therapist didn't understand her statements.    Lavone Neri [facility] 06/01/2013, 1:51 PM

## 2013-06-01 NOTE — Patient Care Conference (Signed)
Inpatient RehabilitationTeam Conference and Plan of Care Update Date: 05/31/2013   Time: 3:15 PM    Patient Name: Ann Kaufman      Medical Record Number: [id number]  Date of Birth: 1930/04/12 Sex: Female         Room/Bed: 4W19C/4W19C-01 Payor Info: Payor: MEDICARE / Plan: MEDICARE PART A AND B / Product Type: *No Product type* /    Admitting Diagnosis: L FRONTAL ICH  Admit Date/Time:  05/24/2013  4:52 PM Admission Comments: No comment available   Primary Diagnosis:  ICH (intracerebral hemorrhage) Principal Problem: ICH (intracerebral hemorrhage)  Patient Active Problem List   Diagnosis Date Noted  . PAF (paroxysmal atrial fibrillation) 05/30/2013  . Syncope 05/30/2013  . Pacemaker 05/30/2013  . Acute respiratory failure 05/19/2013  . Chronic anticoagulation 05/19/2013  . Aspiration pneumonia 05/19/2013  . ICH (intracerebral hemorrhage) 05/18/2013  . Dyspnea on exertion 04/06/2013  . Atrial fibrillation, chronic 04/06/2013  . Atherosclerosis of aorta 04/06/2013  . Hypokalemia 11/28/2011  . Heart palpitations 11/27/2011  . Pleural effusion 11/27/2011  . Atrial fibrillation with RVR 11/27/2011  . HTN (hypertension) 11/27/2011    Expected Discharge Date: Expected Discharge Date:  (SNF)  Team Members Present: Physician leading conference: Dr. Faith Rogue Social Worker Present: Amada Jupiter, LCSW Nurse Present: Carlean Purl, RN PT Present: Cyndia Skeeters, Scot Jun, PT OT Present: Roney Mans, Loistine Chance, OT SLP Present: Feliberto Gottron, SLP PPS Coordinator present : Tora Duck, RN, CRRN;Becky Henrene Dodge, PT     Current Status/Progress Goal Weekly Team Focus  Medical   left frontal hematoma, improved sleep but cognition impaired, mood and behavior wax and wane  improve sleep wake, rx sundowing  see above   Bowel/Bladder   continent of bowel and bladder,IVFs,poor appetite  regular BMs q1-2 days  encourage fluids,intake   Swallow/Nutrition/ Hydration              ADL's   min/mod A for transfers; min A dressing; has not participated in OT for 2 days secondary to unwillingness/confusion  mod I overall - to be downgraded to supervision/min A  activiity tolerance, safety awareness, transfers, standing balance   Mobility   Min A overall, limited standing functional mobility w/ RW 2/2 pain  Downgraded, (S) overall  positioning for pain relief, safety during functional mobility, orientation   Communication   Mod A  min A  self-monitor and correct verbal errors    Safety/Cognition/ Behavioral Observations  Max A  Min A-Supervision   orientation, functional problem solving, awareness   Pain   having alot of back apin,especially w/ movement  pain relief  medicate regularly for pain   Skin   few abrasions from fall  healing of abrasions       Rehab Goals Patient on target to meet rehab goals: No *See Care Plan and progress notes for long and short-term goals.  Barriers to Discharge: cognitive behavior deficits    Possible Resolutions to Barriers:  supervision, NMR, see above    Discharge Planning/Teaching Needs:  plan changed to SNF      Team Discussion:  Continues with fluctuating performance with therapies/ staff.  Sundowning is significant.  Medication adjustments/ management underway.  SW reports daughter is agreed on change of d/c plan to SNF.  Revisions to Treatment Plan:  Downgrading overall goal to supervision.   Continued Need for Acute Rehabilitation Level of Care: The patient requires daily medical management by a physician with specialized training in physical medicine and rehabilitation for the following conditions:  Daily direction of a multidisciplinary physical rehabilitation program to ensure safe treatment while eliciting the highest outcome that is of practical value to the patient.: Yes Daily medical management of patient stability for increased activity during participation in an intensive rehabilitation regime.:  Yes Daily analysis of laboratory values and/or radiology reports with any subsequent need for medication adjustment of medical intervention for : Other;Neurological problems;Post surgical problems  Mykeisha Dysert 06/01/2013, 2:19 PM

## 2013-06-01 NOTE — Progress Notes (Signed)
Subjective/Complaints: Had another good night after a period of agitation late yesterday afternoon into early evening. Now slow to arouse this morning yet seems to respond selectively to certain auditory stimulation  A 12 point review of systems has been performed and if not noted above is otherwise negative.   Objective: Vital Signs: Blood pressure 134/53, pulse 65, temperature 97.9 F (36.6 C), temperature source Oral, resp. rate 17, weight 72.6 kg (160 lb 0.9 oz), SpO2 100.00%. Ct Lumbar Spine Wo Contrast  05/30/2013   CLINICAL DATA:  Low back tenderness extending into the buttocks. Several falls. Recent stroke.  EXAM: CT LUMBAR SPINE WITHOUT CONTRAST  TECHNIQUE: Multidetector CT imaging of the lumbar spine was performed without intravenous contrast administration. Multiplanar CT image reconstructions were also generated.  COMPARISON:  Lumbar spine radiographs 05/25/2013. Chest CT 11/26/2011.  FINDINGS: There are 5 lumbar type vertebral bodies demonstrating a mild convex right scoliosis. The lateral alignment is normal. There is no evidence of fracture or pars defect.  No paraspinal abnormalities are identified. Two low-density left renal lesions (probable cysts) and a nonobstructing left renal calculus are noted. There is no hydronephrosis.  There anterior paraspinal osteophytes at T12-L1 and L1-2, but no spinal stenosis or nerve root encroachment.  L2-3: Mild disc bulging with mild facet and ligamentous hypertrophy. No significant spinal stenosis or nerve root encroachment.  L3-4: Disc bulging, facet and ligamentous hypertrophy contribute to mild central stenosis. The foramina appear sufficiently patent.  L4-5: Disc bulging, facet and ligamentous hypertrophy contribute to mild central stenosis. The foramina appear sufficiently patent.  L5-S1: Mild disc bulging with facet and ligamentous hypertrophy. No significant spinal stenosis or nerve root encroachment.  IMPRESSION: 1. No acute osseous  findings. The alignment is normal aside from a mild scoliosis. 2. Mild disc bulging, facet and ligamentous hypertrophy as described. There is only mild central stenosis at L3-4 and L4-5. No foraminal compromise or nerve root encroachment identified. 3. Probable left renal cysts and a nonobstructing left renal calculus are noted incidentally.   Electronically Signed   By: Roxy Horseman M.D.   On: 05/30/2013 10:48    Recent Labs  05/30/13 1030  WBC 6.4  HGB 11.0*  HCT 33.3*  PLT 198    Recent Labs  05/30/13 1030  NA 136  K 3.6  CL 101  GLUCOSE 104*  BUN 14  CREATININE 0.85  CALCIUM 8.1*   CBG (last 3)  No results found for this basename: GLUCAP,  in the last 72 hours  Wt Readings from Last 3 Encounters:  06/01/13 72.6 kg (160 lb 0.9 oz)  05/24/13 78.291 kg (172 lb 9.6 oz)  04/06/13 74.481 kg (164 lb 3.2 oz)    Physical Exam:  Constitutional: She is oriented to person, place, and time. She appears well-developed and well-nourished.  HENT:  Head: Normocephalic and atraumatic.  Eyes: Conjunctivae and EOM are normal. Pupils are equal, round, and reactive to light. Right eye exhibits no discharge. Left eye exhibits no discharge. No scleral icterus.  Neck: Normal range of motion. Neck supple.  Cardiovascular: Normal rate and regular rhythm. Exam reveals no gallop and no friction rub.  No murmur heard.  Respiratory: Effort normal. No respiratory distress. She has no wheezes.  GI: Soft. Bowel sounds are normal. She exhibits no distension. There is no tenderness.  Musculoskeletal: She exhibits no edema. Minimal low back pain today at rest  Neurological: She is drowsy, distracted, confused. Limited insight and awareness Right facial weakness. Speech with dysarthria. Language is aphasic.  Sometime language of confusion? Extremely alert. Follows simple commands.  Skin: Skin is warm and dry.  A few limb bruises noted  Psychiatric: confused, calm/non-agitated   Assessment/Plan: 1.  Functional deficits secondary to left frontal hematoma which require 3+ hours per day of interdisciplinary therapy in a comprehensive inpatient rehab setting. Physiatrist is providing close team supervision and 24 hour management of active medical problems listed below. Physiatrist and rehab team continue to assess barriers to discharge/monitor patient progress toward functional and medical goals.  FIM: FIM - Bathing Bathing Steps Patient Completed: Chest;Right Arm;Left Arm;Abdomen;Front perineal area;Right upper leg;Left upper leg Bathing: 3: Mod-Patient completes 5-7 42f 10 parts or 50-74%  FIM - Upper Body Dressing/Undressing Upper body dressing/undressing steps patient completed: Thread/unthread right sleeve of pullover shirt/dresss;Thread/unthread left sleeve of pullover shirt/dress;Put head through opening of pull over shirt/dress;Pull shirt over trunk Upper body dressing/undressing: 5: Supervision: Safety issues/verbal cues FIM - Lower Body Dressing/Undressing Lower body dressing/undressing steps patient completed: Thread/unthread right pants leg;Thread/unthread left pants leg;Pull pants up/down;Don/Doff right sock;Don/Doff left sock Lower body dressing/undressing: 4: Min-Patient completed 75 plus % of tasks  FIM - Toileting Toileting steps completed by patient: Performs perineal hygiene;Adjust clothing prior to toileting;Adjust clothing after toileting Toileting Assistive Devices: Grab bar or rail for support Toileting: 4: Assist with fasteners  FIM - Diplomatic Services operational officer Devices: Grab bars Toilet Transfers: 4-From toilet/BSC: Min A (steadying Pt. > 75%);4-To toilet/BSC: Min A (steadying Pt. > 75%)  FIM - Bed/Chair Transfer Bed/Chair Transfer Assistive Devices: Arm rests Bed/Chair Transfer: 4: Bed > Chair or W/C: Min A (steadying Pt. > 75%);4: Chair or W/C > Bed: Min A (steadying Pt. > 75%);4: Sit > Supine: Min A (steadying pt. > 75%/lift 1 leg);4: Supine >  Sit: Min A (steadying Pt. > 75%/lift 1 leg)  FIM - Locomotion: Wheelchair Locomotion: Wheelchair: 4: Travels 150 ft or more: maneuvers on rugs and over door sillls with minimal assistance (Pt.>75%) FIM - Locomotion: Ambulation Locomotion: Ambulation Assistive Devices: Designer, industrial/product Ambulation/Gait Assistance: 4: Min guard;4: Min assist Locomotion: Ambulation: 2: Travels 50 - 149 ft with minimal assistance (Pt.>75%)  Comprehension Comprehension Mode: Auditory Comprehension: 4-Understands basic 75 - 89% of the time/requires cueing 10 - 24% of the time  Expression Expression Mode: Verbal Expression: 4-Expresses basic 75 - 89% of the time/requires cueing 10 - 24% of the time. Needs helper to occlude trach/needs to repeat words.  Social Interaction Social Interaction: 3-Interacts appropriately 50 - 74% of the time - May be physically or verbally inappropriate.  Problem Solving Problem Solving: 2-Solves basic 25 - 49% of the time - needs direction more than half the time to initiate, plan or complete simple activities  Memory Memory: 2-Recognizes or recalls 25 - 49% of the time/requires cueing 51 - 75% of the time  Medical Problem List and Plan:  ICH with seizure activity--both CT's done last night showed improvement  -Recheck CT later this week? 1. DVT Prophylaxis/Anticoagulation: Pharmaceutical: Lovenox  2. Pain Management: CT of lumbar spine shows multilevel spondylosis----mild stenosis. No fractures  -scheduled lidoderm patches  -oxycodone for pain, tylenol also  -k pad  -NEED TO BE OOB. 3. H/o depression/Mood: risperdal trial, added lexapro for depression. Pt is not happy being here but remains cognitively and linguistically impaired.    -changed timing of risperdal  -keppra increased to help with mood stabilization  -waist belt needed at times for safety  4. Neuropsych: This patient is not capable of making decisions on her own behalf  at this moment  5. A fib with PPM:  Continue amiodarone and metoprolol. No anticoagulation at this time and f/u with cards in [place].   -appreciate cards follow up requested given--pacer interrogation in regard to Friday's unresponsive episode 7. New onset seizure: on keppra bid--level therapeutic 8. Aspiration PNA: continue levaquin, cxr better--dc'ed abx   LOS (Days) 8 A FACE TO FACE EVALUATION WAS PERFORMED  Khaliah Barnick T 06/01/2013 8:21 AM

## 2013-06-01 NOTE — Progress Notes (Signed)
0715: Called to pts, pt. difficult to arouse, "not responding" per NT report; staring gaze. VSS.. Dr. Riley Kill in to see pt; pt responded aggressively to sternal rub; behavior inconsistent with assessment. Frequent attempts OOB  immediately following lethargic episodes. SWB for safety. Daughter in, assisted pt with breakfast,appetite improved. Pt much calmer remainder of day/eve, cooperative. Took all meds without incident.  Following simple commands with max verbal and physical cues; inattentive. Orientation,behavior continues to fluctuates; language of confusion.

## 2013-06-01 NOTE — Progress Notes (Signed)
Occupational Therapy Session Note  Patient Details  Name: Ann Kaufman MRN: [id number] Date of Birth: [birth year]  Today's Date: 06/01/2013 Time: 0800-0900 Time Calculation (min): 60 min  Short Term Goals: Week 1:  OT Short Term Goal 1 (Week 1): STG=LTG  Skilled Therapeutic Interventions/Progress Updates:    Pt asleep with soft waist belt in place upon arrival.  Pt did not initially respond to greeting or tactile cues.  Pt opened eyes with max verbal and tactile cues but did not respond to request to sit EOB.  Pt required max A to sit EOB and did not open eyes.  After continued multimodal cues patient shook her head and opened her eyes.  Pt did not respond to questions regarding sequencing for shower dressing, etc but responded to tactile cues to transfer to w/c, use toilet, and take shower.  Pt required hand-over-hand assist to initiate bathing tasks.  Pt grimaced periodically throughout session with transitional movements but did not verbalize she was in pain.  Pt required max multimodal vues throughout session for task initiation and sequencing.  Pt's daughter arrived at end of session and accompanied patient to day room to eat breakfast.  Pt became emotional after daughter's arrival. RN notified of patient's perceived pain and location in day room with daughter.  QRB in place while seated in w/c.  Therapy Documentation Precautions:  Precautions Precautions: Fall Restrictions Weight Bearing Restrictions: No Pain: Pain Assessment Pain Assessment: Faces Faces Pain Scale: Hurts even more Pain Location: Leg Pain Orientation: Right Pain Descriptors / Indicators: Aching;Shooting Pain Onset: With Activity Pain Intervention(s): RN made aware;Repositioned  See FIM for current functional status  Therapy/Group: Individual Therapy  Rich Brave 06/01/2013, 9:14 AM

## 2013-06-01 NOTE — Progress Notes (Signed)
   SUBJECTIVE:  Confused.  No obvious chest pain.  No SOB   PHYSICAL EXAM Filed Vitals:   05/31/13 0516 05/31/13 2254 06/01/13 0500 06/01/13 0645  BP: 130/65 122/56 114/47 134/53  Pulse: 62 72 68 65  Temp: 98.3 F (36.8 C)  97.9 F (36.6 C)   TempSrc: Oral  Oral   Resp: 17  17   Weight: 161 lb 2.5 oz (73.1 kg)  160 lb 0.9 oz (72.6 kg)   SpO2: 96%  97% 100%   General:  Clear Lungs:  RRR Heart:  Positive bowel sounds, no rebound no guarding Abdomen:  Positive bowel sounds, no rebound no guarding Extremities:  No edema  LABS:  No results found for this or any previous visit (from the past 24 hour(s)).  Intake/Output Summary (Last 24 hours) at 06/01/13 1719 Last data filed at 05/31/13 2100  Gross per 24 hour  Intake    360 ml  Output      0 ml  Net    360 ml    ASSESSMENT AND PLAN:   PAF (paroxysmal atrial fibrillation):   She has PAF.  However, this was earlier in the hospitalization.  She is obviously not an anticoagulation candidate.    Syncope:  Etiology is not clear.  However, I do not suspect a cardiac etiology.  I reviewed the pacemaker interrogation.  She had normal pacemaker function.  There were no episodes of atrial fibrillation at the time of the event.  No further cardiac work up is indicated.   Pacemaker:  See above.      Rollene Rotunda 06/01/2013 5:19 PM

## 2013-06-01 NOTE — Progress Notes (Signed)
Pt. Took 05/31/13 evening meds without difficulty. This RN able to start .45NS IV @ 65mL/hr without difficulty. Pt. Continent and slept through the night with no signs of distress or discomfort. No complaints of pain and no attempts to get out of bed unassisted. Will continue to monitor for changes in condition.

## 2013-06-01 NOTE — Progress Notes (Signed)
Social Work Patient ID: Ann Kaufman, female   DOB: 21-Aug-1929, 79 y.o.   MRN: [id number]  Amada Jupiter, LCSW Social Worker Signed  Patient Care Conference Service date: 06/01/2013 2:17 PM  Inpatient RehabilitationTeam Conference and Plan of Care Update Date: 05/31/2013   Time: 3:15 PM     Patient Name: Ann Kaufman       Medical Record Number: [id number]   Date of Birth: 05/31/1930 Sex: Female         Room/Bed: 4W19C/4W19C-01 Payor Info: Payor: MEDICARE / Plan: MEDICARE PART A AND B / Product Type: *No Product type* /   Admitting Diagnosis: L FRONTAL ICH   Admit Date/Time:  05/24/2013  4:52 PM Admission Comments: No comment available   Primary Diagnosis:  ICH (intracerebral hemorrhage) Principal Problem: ICH (intracerebral hemorrhage)    Patient Active Problem List     Diagnosis  Date Noted   .  PAF (paroxysmal atrial fibrillation)  05/30/2013   .  Syncope  05/30/2013   .  Pacemaker  05/30/2013   .  Acute respiratory failure  05/19/2013   .  Chronic anticoagulation  05/19/2013   .  Aspiration pneumonia  05/19/2013   .  ICH (intracerebral hemorrhage)  05/18/2013   .  Dyspnea on exertion  04/06/2013   .  Atrial fibrillation, chronic  04/06/2013   .  Atherosclerosis of aorta  04/06/2013   .  Hypokalemia  11/28/2011   .  Heart palpitations  11/27/2011   .  Pleural effusion  11/27/2011   .  Atrial fibrillation with RVR  11/27/2011   .  HTN (hypertension)  11/27/2011     Expected Discharge Date: Expected Discharge Date:  (SNF)  Team Members Present: Physician leading conference: Dr. Faith Rogue Social Worker Present: Amada Jupiter, LCSW Nurse Present: Carlean Purl, RN PT Present: Cyndia Skeeters, Scot Jun, PT OT Present: Roney Mans, Loistine Chance, OT SLP Present: Feliberto Gottron, SLP PPS Coordinator present : Tora Duck, RN, CRRN;Becky Henrene Dodge, PT        Current Status/Progress  Goal  Weekly Team Focus   Medical     left frontal hematoma, improved sleep  but cognition impaired, mood and behavior wax and wane  improve sleep wake, rx sundowing  see above   Bowel/Bladder     continent of bowel and bladder,IVFs,poor appetite  regular BMs q1-2 days  encourage fluids,intake   Swallow/Nutrition/ Hydration            ADL's     min/mod A for transfers; min A dressing; has not participated in OT for 2 days secondary to unwillingness/confusion  mod I overall - to be downgraded to supervision/min A  activiity tolerance, safety awareness, transfers, standing balance   Mobility     Min A overall, limited standing functional mobility w/ RW 2/2 pain  Downgraded, (S) overall  positioning for pain relief, safety during functional mobility, orientation   Communication     Mod A  min A  self-monitor and correct verbal errors    Safety/Cognition/ Behavioral Observations    Max A  Min A-Supervision   orientation, functional problem solving, awareness   Pain     having alot of back apin,especially w/ movement  pain relief  medicate regularly for pain   Skin     few abrasions from fall  healing of abrasions      Rehab Goals Patient on target to meet rehab goals: No *See Care Plan and progress notes for long and short-term goals.  Barriers to Discharge:  cognitive behavior deficits      Possible Resolutions to Barriers:    supervision, NMR, see above      Discharge Planning/Teaching Needs:    plan changed to SNF      Team Discussion:    Continues with fluctuating performance with therapies/ staff.  Sundowning is significant.  Medication adjustments/ management underway.  SW reports daughter is agreed on change of d/c plan to SNF.   Revisions to Treatment Plan:    Downgrading overall goal to supervision.    Continued Need for Acute Rehabilitation Level of Care: The patient requires daily medical management by a physician with specialized training in physical medicine and rehabilitation for the following conditions: Daily direction of a  multidisciplinary physical rehabilitation program to ensure safe treatment while eliciting the highest outcome that is of practical value to the patient.: Yes Daily medical management of patient stability for increased activity during participation in an intensive rehabilitation regime.: Yes Daily analysis of laboratory values and/or radiology reports with any subsequent need for medication adjustment of medical intervention for : Other;Neurological problems;Post surgical problems  Adali Pennings 06/01/2013, 2:19 PM

## 2013-06-01 NOTE — Progress Notes (Signed)
Physical Therapy Session Note  Patient Details  Name: Ann Kaufman MRN: [id number] Date of Birth: 11/16/1929  Today's Date: 06/01/2013 Time: 1015-1100 Time Calculation (min): 45 min  Short Term Goals: Week 1:  PT Short Term Goal 1 (Week 1): STGs=LTGs due to anticipated LOS  Skilled Therapeutic Interventions/Progress Updates:  1:1. Pt received sitting in recliner, looking distressed with daughter present in room. Pt verbalizing very high level of pain, RN made aware and in to address pain concerns later in tx session. Daughter expressing discontent w/ choice of clothing that pt was dressed in today, requesting that pt change. Pt unable to attend to clothing for change due to perseveration on pain despite attempts at redirection. Pt eventually able to be redirected to use of bathroom. Pt with increased verbalizations of pain in standing position, demonstrating flexed posture overall. Min A for toilet t/f and management of clothing, supervision for pericare. Pt able to ambulate 15' w/ RW and min A from recliner>toilet, however, only able to amb 5' out of bathroom prior to immediate need to sit in w/c 2/2 pain. Pt again difficult to redirect w/c<>recliner for improved positioning 2/2 pain. At end of session pt sitting in recliner w/ ice applied to lower back, quick release belt in place and daughter in room.   Therapy Documentation Precautions:  Precautions Precautions: Fall Restrictions Weight Bearing Restrictions: No   Pain: Pain Assessment Pain Assessment: Faces Faces Pain Scale: Hurts even more Pain Location: Leg Pain Orientation: Right Pain Descriptors / Indicators: Aching;Shooting Pain Onset: With Activity Pain Intervention(s): RN made aware;Repositioned  See FIM for current functional status  Therapy/Group: Individual Therapy  Denzil Hughes 06/01/2013, 11:56 AM

## 2013-06-01 NOTE — Progress Notes (Signed)
Speech Language Pathology Daily Session Note  Patient Details  Name: Ann Kaufman MRN: [id number] Date of Birth: Apr 12, 1930  Today's Date: 06/01/2013 Time: 1515-1600 Time Calculation (min): 45 min  Short Term Goals: Week 1: SLP Short Term Goal 1 (Week 1): Pt will demonstrate functional problem solving for mildly complex tasks with Min A verbal and visual cues. SLP Short Term Goal 2 (Week 1): Pt will utilize external memory aids to recall new, daily information with supervision verbal cues.  SLP Short Term Goal 3 (Week 1): Pt will self-monitor and correct verbal errors with supervision verbal and question cues.  SLP Short Term Goal 4 (Week 1): Pt will demonstrate appropriate safety awareness and request assistance with supervision question cues.   Skilled Therapeutic Interventions: Treatment focus on cognitive goals. Patient and caregiver present for session today.  Patient agreeable to structured naming task with Mod question cues needed to accurately describe objects.  Patient required Max cues to attend to topic past 2 turns and perseverated on dogs and birds throughout session.  Patient was easily redirect for 1-2 turns and would then revert back to dogs or birds; she continues to require Max-Total assist to self-monitor and correct verbal errors.  Caregiver asked appropriate questions regarding how to assist with cognitive-linguistic errors and SLP informed her that easy redirections may be appropriate at times.  Continue plan of care.   FIM:  Comprehension Comprehension Mode: Auditory Comprehension: 4-Understands basic 75 - 89% of the time/requires cueing 10 - 24% of the time Expression Expression Mode: Verbal Expression: 4-Expresses basic 75 - 89% of the time/requires cueing 10 - 24% of the time. Needs helper to occlude trach/needs to repeat words. Social Interaction Social Interaction: 3-Interacts appropriately 50 - 74% of the time - May be physically or verbally  inappropriate. Problem Solving Problem Solving: 2-Solves basic 25 - 49% of the time - needs direction more than half the time to initiate, plan or complete simple activities Memory Memory: 2-Recognizes or recalls 25 - 49% of the time/requires cueing 51 - 75% of the time FIM - Eating Eating Activity: 6: Swallowing techniques: self-managed  Pain Pain Assessment Pain Assessment: No/denies pain  Therapy/Group: Individual Therapy  Charlane Ferretti., CCC-SLP [phone]  Maayan Jenning 06/01/2013, 5:11 PM

## 2013-06-01 NOTE — Progress Notes (Signed)
Social Work Patient ID: Ann Kaufman, female   DOB: Mar 25, 1930, 79 y.o.   MRN: [id number]  Met with pt and daughter today to review team conference.  Pt with confusion and language deficits, however, reviewed d/c plan with both anyway.  Daughter very aware of fluctuating cognitive/ behavioral status overall.  Continues to agree that best d/c option at present level of care is SNF.  Informed pt of this plan, however, she did not comment on this or seem to have any appreciation of what was being discussed.  Will continue to follow for support and d/c planning.  Nasean Zapf, LCSW

## 2013-06-02 ENCOUNTER — Inpatient Hospital Stay (HOSPITAL_COMMUNITY): Payer: Medicare Other

## 2013-06-02 ENCOUNTER — Encounter (HOSPITAL_COMMUNITY): Payer: Medicare Other

## 2013-06-02 ENCOUNTER — Inpatient Hospital Stay (HOSPITAL_COMMUNITY): Payer: Medicare Other | Admitting: Speech Pathology

## 2013-06-02 ENCOUNTER — Inpatient Hospital Stay (HOSPITAL_COMMUNITY): Payer: Medicare Other | Admitting: Occupational Therapy

## 2013-06-02 MED ORDER — SERTRALINE HCL 50 MG PO TABS
50.0000 mg | ORAL_TABLET | Freq: Every day | ORAL | Status: DC
  Administered 2013-06-03 – 2013-06-07 (×5): 50 mg via ORAL
  Filled 2013-06-02 (×8): qty 1

## 2013-06-02 MED ORDER — OXYCODONE HCL 5 MG PO TABS
5.0000 mg | ORAL_TABLET | Freq: Two times a day (BID) | ORAL | Status: DC
  Administered 2013-06-03 – 2013-06-06 (×7): 5 mg via ORAL
  Filled 2013-06-02 (×9): qty 1

## 2013-06-02 NOTE — Progress Notes (Signed)
Speech Language Pathology Weekly Progress & Session Notes  Patient Details  Name: Ann Kaufman MRN: [id number] Date of Birth: 01-22-1930  Today's Date: 06/02/2013 Time: 1400-1445 Time Calculation (min): 45 min  Short Term Goals: Week 1: SLP Short Term Goal 1 (Week 1): Pt will demonstrate functional problem solving for mildly complex tasks with Min A verbal and visual cues. SLP Short Term Goal 1 - Progress (Week 1): Not met SLP Short Term Goal 2 (Week 1): Pt will utilize external memory aids to recall new, daily information with supervision verbal cues.  SLP Short Term Goal 2 - Progress (Week 1): Not met SLP Short Term Goal 3 (Week 1): Pt will self-monitor and correct verbal errors with supervision verbal and question cues.  SLP Short Term Goal 3 - Progress (Week 1): Not met SLP Short Term Goal 4 (Week 1): Pt will demonstrate appropriate safety awareness and request assistance with supervision question cues.  SLP Short Term Goal 4 - Progress (Week 1): Not met  New Short-Term Goals:  Week 2: SLP Short Term Goal 1 (Week 2): Pt will demonstrate topic maintenance for ~3 turns with Max A verbal cues for redirection  SLP Short Term Goal 2 (Week 2): Pt will demonstrate sustained attention to a functional task for 5 minutes with Max A cues for redirection  SLP Short Term Goal 3 (Week 2): Pt will orient to place, time and situation with Max A multimodal cues  SLP Short Term Goal 4 (Week 2): Pt will self-monitor and correct verbal errors with Max A multimodal cueing at the phrase level   Weekly Progress Updates: Pt's progress has been inconsistent since initial evaluation and pt has met 0 of 4 STG's this reporting period. Currently, pt requires Max-total A for orientation, attention, initiation, safety awareness, intellectual awareness, functional problem solving and safety awareness. Pt's verbal expression is also inconsistent and she requires Max-Total A for topic maintenance and to self-monitor  and correct verbal errors.  Pt is currently requiring overall increased assistance both cognitively and physically for all tasks and max encouragement to participate as compared to initial evaluation. Pt would benefit from continued skilled SLP intervention to maximize cognitive function and functional communication prior to discharge. Pt's discharge plan has changed to SNF at this time.    SLP Intensity: Minumum of 1-2 x/day, 30 to 90 minutes SLP Frequency: 5 out of 7 days SLP Duration/Estimated Length of Stay: SNF placement  SLP Treatment/Interventions: Cueing hierarchy;Cognitive remediation/compensation;Environmental controls;Internal/external aids;Speech/Language facilitation;Patient/family education;Therapeutic Activities;Functional tasks  Daily Session Skilled Therapeutic Intervention: Treatment focus on cognitive-linguistic goals. SLP facilitated session with Max-Total A multimodal cueing for topic maintenance during functional conversation for ~2 turns. Pt also required Max-total A multimodal cueing to self-monitor and correct verbal errors at the word level. Pt participated in naming task and was able to name ~3 items independently and required total A for remainder of task.  Pt's verbal expression is characterized by questionable language of confusion, jargon, neologisms and phonemic paraphasias. Continue plan of care.   FIM:  Comprehension Comprehension Mode: Auditory Comprehension: 2-Understands basic 25 - 49% of the time/requires cueing 51 - 75% of the time Expression Expression Mode: Verbal Expression: 2-Expresses basic 25 - 49% of the time/requires cueing 50 - 75% of the time. Uses single words/gestures. Social Interaction Social Interaction: 2-Interacts appropriately 25 - 49% of time - Needs frequent redirection. Problem Solving Problem Solving: 2-Solves basic 25 - 49% of the time - needs direction more than half the time to initiate, plan  or complete simple  activities Memory Memory: 2-Recognizes or recalls 25 - 49% of the time/requires cueing 51 - 75% of the time Pain Intermittent pain while moving, pt repositioned and RN notified.   Therapy/Group: Individual Therapy  Jacarra Bobak 06/02/2013, 3:59 PM

## 2013-06-02 NOTE — Progress Notes (Signed)
Physical Therapy Weekly Progress Note  Patient Details  Name: Ann Kaufman MRN: [id number] Date of Birth: 1930-01-17  Today's Date: 06/02/2013 Time: 0930-1030  Time Calculation (min):  Pt's progress has been very inconsistent since initial evaluation and pt has achieved 0/7 LTGs since admission. Pt currently req mod/max verbal cues for participation in therapy sessions as well as redirection to therapeutic tasks 2/2 decreased sustained and selective attention. Pt often attempting to hide poor orientation through sarcasm. Pt intermittently able to perform mobility related goals at target level, however, remains inconsistent req supervision-max A overall 2/2 ongoing low back pain radiating in to B LE that exacerbated by transitional movements, perseveration on pain and fluctuating cognition. Per OT report pt consistently req increased physical assist during initial tx sessions in AM vs. Later in day w/ PT. Continued emphasis in therapies on cognition, stretching, strengthening, positioning for pain relief as well as safe use of RW for increased tolerance to standing functional mobility. Medical staff aware of pain concerns and addressing issue. LTGs downgraded due to anticipated d/c now to SNF vs. Home environment.  Patient continues to demonstrate the following deficits: decreased functional endurance, decreased balance, decreased strength, pain, decreased intellectual/emergent awareness of impairments as well as decreased sustained/selective attention to therapeutic tasks and therefore will continue to benefit from skilled PT intervention to enhance overall performance with the identified impairments.  See Patient's Care Plan for progression toward long term goals.  Patient progressing toward long term goals..  Continue plan of care.  Skilled Therapeutic Interventions/Progress Updates:  Pt received sitting in recliner, mod encouragement to participate w/ therapy outside of room. Focus this  session on ambulation as well as sustained attention to various activities in quiet environments w/ daughter present and rec therapist present for co-tx. Pt able to amb 100'x2 and 63' w/ RW, demonstrating very slow pace 2/2 pain, however, demonstrating increased tolerance to ambulation when cued to amb at a more quick and fluid pace. Therapists initially attempting to engage pt in holiday craft, but pt perseverating on pain as well as demonstrating sarcastic tangential conversation w/ difficulty redirecting. Pt eventually able to be redirected to game of dominoes after bout of ambulation. Pt again demonstrating difficulty sustaining attention to activity and intermittently perseverating on counting dominoes vs.placing appropriate piece down. Pt transported back to room via w/c 2/2 fatigue, min A for t/f back to recliner. Pt sitting in recliner at end of session w/ all needs in reach, quick release belt in place and daughter in room.   Therapy Documentation Precautions:  Precautions Precautions: Fall Restrictions Weight Bearing Restrictions: No Vital Signs: Therapy Vitals Temp: 97.6 F (36.4 C) Temp src: Oral Pulse Rate: 68 Resp: 17 BP: 158/85 mmHg Patient Position, if appropriate: Lying Oxygen Therapy SpO2: 97 % O2 Device: None (Room air)  See FIM for current functional status  Therapy/Group: Co-Treatment w/ Rec Therapy  Denzil Hughes 06/02/2013, 4:48 PM

## 2013-06-02 NOTE — Progress Notes (Signed)
Subjective/Complaints: Had a reasonable night after she settled down yesterday  A 12 point review of systems has been performed and if not noted above is otherwise negative.   Objective: Vital Signs: Blood pressure 150/79, pulse 66, temperature 97.5 F (36.4 C), temperature source Oral, resp. rate 17, weight 68.13 kg (150 lb 3.2 oz), SpO2 94.00%. No results found.  Recent Labs  05/30/13 1030  WBC 6.4  HGB 11.0*  HCT 33.3*  PLT 198    Recent Labs  05/30/13 1030  NA 136  K 3.6  CL 101  GLUCOSE 104*  BUN 14  CREATININE 0.85  CALCIUM 8.1*   CBG (last 3)  No results found for this basename: GLUCAP,  in the last 72 hours  Wt Readings from Last 3 Encounters:  06/02/13 68.13 kg (150 lb 3.2 oz)  05/24/13 78.291 kg (172 lb 9.6 oz)  04/06/13 74.481 kg (164 lb 3.2 oz)    Physical Exam:  Constitutional: She is oriented to person, place, and time. She appears well-developed and well-nourished.  HENT:  Head: Normocephalic and atraumatic.  Eyes: Conjunctivae and EOM are normal. Pupils are equal, round, and reactive to light. Right eye exhibits no discharge. Left eye exhibits no discharge. No scleral icterus.  Neck: Normal range of motion. Neck supple.  Cardiovascular: Normal rate and regular rhythm. Exam reveals no gallop and no friction rub.  No murmur heard.  Respiratory: Effort normal. No respiratory distress. She has no wheezes.  GI: Soft. Bowel sounds are normal. She exhibits no distension. There is no tenderness.  Musculoskeletal: She exhibits no edema. Minimal low back pain today at rest  Neurological: She is drowsy, distracted, confused. Limited insight and awareness Right facial weakness. Speech with dysarthria. Language is aphasic. Sometime language of confusion? Extremely alert. Follows simple commands.  Skin: Skin is warm and dry.  A few limb bruises noted  Psychiatric: confused, calm/non-agitated   Assessment/Plan: 1. Functional deficits secondary to left  frontal hematoma which require 3+ hours per day of interdisciplinary therapy in a comprehensive inpatient rehab setting. Physiatrist is providing close team supervision and 24 hour management of active medical problems listed below. Physiatrist and rehab team continue to assess barriers to discharge/monitor patient progress toward functional and medical goals.  Pursuing SNF for safety and given dispo situation.  FIM: FIM - Bathing Bathing Steps Patient Completed: Chest;Abdomen;Right upper leg;Left upper leg Bathing: 3: Mod-Patient completes 5-7 71f 10 parts or 50-74%  FIM - Upper Body Dressing/Undressing Upper body dressing/undressing steps patient completed: Thread/unthread left sleeve of pullover shirt/dress;Thread/unthread right sleeve of pullover shirt/dresss;Put head through opening of pull over shirt/dress Upper body dressing/undressing: 4: Min-Patient completed 75 plus % of tasks FIM - Lower Body Dressing/Undressing Lower body dressing/undressing steps patient completed: Thread/unthread right pants leg;Pull pants up/down Lower body dressing/undressing: 2: Max-Patient completed 25-49% of tasks  FIM - Toileting Toileting steps completed by patient: Performs perineal hygiene;Adjust clothing after toileting;Adjust clothing prior to toileting Toileting Assistive Devices: Grab bar or rail for support Toileting: 4: Assist with fasteners  FIM - Diplomatic Services operational officer Devices: Grab bars;Walker Toilet Transfers: 4-From toilet/BSC: Min A (steadying Pt. > 75%);4-To toilet/BSC: Min A (steadying Pt. > 75%)  FIM - Bed/Chair Transfer Bed/Chair Transfer Assistive Devices: Therapist, occupational: 4: Bed > Chair or W/C: Min A (steadying Pt. > 75%);4: Chair or W/C > Bed: Min A (steadying Pt. > 75%)  FIM - Locomotion: Wheelchair Locomotion: Wheelchair: 0: Activity did not occur FIM - Locomotion: Ambulation Locomotion: Health visitor  Devices: Dealer Ambulation/Gait Assistance: 4: Min guard;4: Min assist Locomotion: Ambulation: 1: Travels less than 50 ft with minimal assistance (Pt.>75%)  Comprehension Comprehension Mode: Auditory Comprehension: 4-Understands basic 75 - 89% of the time/requires cueing 10 - 24% of the time  Expression Expression Mode: Verbal Expression: 4-Expresses basic 75 - 89% of the time/requires cueing 10 - 24% of the time. Needs helper to occlude trach/needs to repeat words.  Social Interaction Social Interaction: 3-Interacts appropriately 50 - 74% of the time - May be physically or verbally inappropriate.  Problem Solving Problem Solving: 2-Solves basic 25 - 49% of the time - needs direction more than half the time to initiate, plan or complete simple activities  Memory Memory: 2-Recognizes or recalls 25 - 49% of the time/requires cueing 51 - 75% of the time  Medical Problem List and Plan:  ICH with seizure activity--both CT's done last night showed improvement  -Recheck CT later this week? 1. DVT Prophylaxis/Anticoagulation: Pharmaceutical: Lovenox  2. Pain Management: CT of lumbar spine shows multilevel spondylosis----mild stenosis. No fractures  -scheduled lidoderm patches  -oxycodone for pain, tylenol also  -k pad  -NEEDS TO BE OOB. 3. H/o depression/Mood: risperdal trial, added lexapro for depression. Pt is not happy being here but remains cognitively and linguistically impaired.    -changed timing of risperdal--will give if pt willing to take  -keppra increased to help with mood stabilization  -waist belt needed at times for safety  4. Neuropsych: This patient is not capable of making decisions on her own behalf at this moment  5. A fib with PPM: Continue amiodarone and metoprolol. No anticoagulation at this time and f/u with cards in [place].   -appreciate cards follow up requested given--pacer interrogation in regard to Friday's unresponsive episode 7. New onset seizure: on keppra  bid--level therapeutic 8. Aspiration PNA:abx dc'ed   LOS (Days) 9 A FACE TO FACE EVALUATION WAS PERFORMED  SWARTZ,ZACHARY T 06/02/2013 8:30 AM

## 2013-06-02 NOTE — Progress Notes (Signed)
Recreational Therapy Session Note  Patient Details  Name: Ann Kaufman MRN: [id number] Date of Birth: 07-21-1929 Today's Date: 06/02/2013  Pain: c/o pain, premedicated, repositioning & deep breathing techniques assisted with relief Skilled Therapeutic Interventions/Progress Updates: Session focused on activity tolerance, ambulation using RW, safety, dynamic sitting balance, following simple commands, attention to task.  Pt's daughter present & participating in session.  Pt requires close supervision-min assist for ambulation & mod-max cues for cognition. Kania Regnier 06/02/2013, 3:50 PM

## 2013-06-02 NOTE — Progress Notes (Signed)
Occupational Therapy Weekly Progress Note  Patient Details  Name: Ann Kaufman MRN: [id number] Date of Birth: 04/17/1930  Today's Date: 06/02/2013  Pt progress has been inconsistent since initial evaluation.  Pt requires supervision to max A for transfers and BADLs.  Pt requires max verbal cues for orientation to place, situation, and date.  Pt's conversation is often garbled and nonsensical.  Pt inconsistently c/o of pain in left LE with transitional movements.  Pt requires supervision>max A for transfers and functional ambulation.  Pt is currently requiring increased assistance and max encouragement to participate as compared to initial evaluation.   Patient continues to demonstrate the following deficits: muscle weakness, decreased initiation, decreased attention, decreased awareness, decreased problem solving, decreased safety awareness, decreased memory and delayed processing and decreased standing balance, decreased postural control, decreased balance strategies and difficulty maintaining precautions and therefore will continue to benefit from skilled OT intervention to enhance overall performance with BADL and Reduce care partner burden.  Patient not progressing toward long term goals.  See goal revision..  Continue plan of care.  OT Short Term Goals Week 1:  OT Short Term Goal 1 (Week 1): STG=LTG Week 2:   STG=LTG secondary to SNF placement.      Therapy Documentation Precautions:  Precautions Precautions: Fall Restrictions Weight Bearing Restrictions: No    See FIM for current functional status   Rich Brave 06/02/2013, 3:28 PM

## 2013-06-02 NOTE — Plan of Care (Signed)
Problem: RH SAFETY Goal: RH STG ADHERE TO SAFETY PRECAUTIONS W/ASSISTANCE/DEVICE STG Adhere to Safety Precautions With min Assistance/Device.  Outcome: Not Progressing Patient currently requires a soft waist belt to maintain safety.

## 2013-06-02 NOTE — Progress Notes (Signed)
Occupational Therapy Session Note  Patient Details  Name: PENNI PENADO MRN: [id number] Date of Birth: 20-Nov-1929  Today's Date: 06/02/2013 Time: 0800-0855 Time Calculation (min): 55 min  Short Term Goals: Week 1:  OT Short Term Goal 1 (Week 1): STG=LTG  Skilled Therapeutic Interventions/Progress Updates:    Pt resting in bed upon arrival and agreeable to getting OOB to bathe and dress.  Pt declined taking a shower this morning.  Pt required tactile cues to initiate sitting EOB.  Pt required max verbal cues to initiate all bathing tasks.  Pt initiated dressing tasks when presented with clothing.  Pt required extra time and verbal cues to complete all tasks this morning. Pt grimaced with all transitional movements but would not rate pain.  Pt required max verbal cues for orientation to place, situation, and date. Focus on activity tolerance, active participation, transfers, task initiation, sequencing, standing balance, and safety awareness.   Therapy Documentation Precautions:  Precautions Precautions: Fall Restrictions Weight Bearing Restrictions: No Pain: Pain Assessment Pain Assessment: Faces Pain Score: 8  Pain Type: Acute pain Pain Location: Leg Pain Orientation: Left Pain Descriptors / Indicators: Aching;Shooting  See FIM for current functional status  Therapy/Group: Individual Therapy  Rich Brave 06/02/2013, 9:06 AM

## 2013-06-02 NOTE — Progress Notes (Signed)
Occupational Therapy Session Note  Patient Details  Name: LESSIE FUNDERBURKE MRN: [id number] Date of Birth: [birth year]  Today's Date: 06/02/2013 Time: 1115-1150 Time Calculation (min): 35 min  Short Term Goals: Week 1:  OT Short Term Goal 1 (Week 1): STG=LTG  Skilled Therapeutic Interventions/Progress Updates:  Upon arrival, patient ambulating in her hospital room with RW upon arrival with OTA at her side and patient's daughter pushing w/c behind her.  Continued to ambulate with supervision to the therapy apartment then sat on love seat to rest.  Patient only able to form sentences/words that made sense about 10% of the session.  Patient's daughter eager to provide hands on with her mother therefore education started in this session for ambulating to the toilet with RW, daughter's proper hand placement, toileting, having a plan in case patient's legs were to give out, etc.  Daughter understands that the patient's primary OT and PT will continue with education and they will make the final decision as to what and when she will assist her mother.  Daughter also asked that the paid caregiver be trained to take her to the bathroom here at the hospital.  Patient requested back to bed, SWB applied and the RN and daughter present.  Therapy Documentation Precautions:  Precautions Precautions: Fall Restrictions Weight Bearing Restrictions: No Pain: Right hip and leg pain, unable to rate, grimaced and several times said, "God Bless [place]" during functional mobility.  Rest and repositioned, daughter reported that patient had been premedicated.  Therapy/Group: Individual Therapy  Kevina Piloto 06/02/2013, 12:48 PM

## 2013-06-02 NOTE — Progress Notes (Signed)
0430: Pt. Trying to get out of bed to go to bathroom, bedpan offered, however pt. became agitated. This RN and NT attempted again without success. New brief put on patient and SWB put in place for safety. Bed alarm on. Pt. still attempted to get out of bed and began pulling at IV. Pt. called to nurses station for assistance. This RN went to room to toilet patient in bathroom with assistance from NT. Upon returning to bed pt. became tearful. This RN noticed IV had become dislodged by pt. and removed IV at 0500. Pt. now resting comfortably in bed with SWB in place for safety and bed alarm on. Will continue to monitor pt.

## 2013-06-03 ENCOUNTER — Inpatient Hospital Stay (HOSPITAL_COMMUNITY): Payer: Medicare Other

## 2013-06-03 ENCOUNTER — Inpatient Hospital Stay (HOSPITAL_COMMUNITY): Payer: Medicare Other | Admitting: Speech Pathology

## 2013-06-03 ENCOUNTER — Inpatient Hospital Stay (HOSPITAL_COMMUNITY): Payer: Medicare Other | Admitting: Physical Therapy

## 2013-06-03 DIAGNOSIS — R4182 Altered mental status, unspecified: Secondary | ICD-10-CM | POA: Insufficient documentation

## 2013-06-03 LAB — BASIC METABOLIC PANEL
BUN: 14 mg/dL (ref 6–23)
Calcium: 8.9 mg/dL (ref 8.4–10.5)
Chloride: 100 mEq/L (ref 96–112)
Creatinine, Ser: 0.87 mg/dL (ref 0.50–1.10)
GFR calc Af Amer: 71 mL/min — ABNORMAL LOW (ref >90)
GFR calc non Af Amer: 61 mL/min — ABNORMAL LOW (ref >90)
Potassium: 4 mEq/L (ref 3.5–5.1)

## 2013-06-03 MED ORDER — LEVETIRACETAM 500 MG PO TABS
500.0000 mg | ORAL_TABLET | Freq: Two times a day (BID) | ORAL | Status: DC
  Administered 2013-06-03 – 2013-06-08 (×11): 500 mg via ORAL
  Filled 2013-06-03 (×15): qty 1

## 2013-06-03 NOTE — Progress Notes (Signed)
Speech Language Pathology Daily Session Note  Patient Details  Name: Ann Kaufman MRN: [id number] Date of Birth: [birth year]  Today's Date: 06/03/2013 Time: 8295-6213 Time Calculation (min): 45 min  Short Term Goals: Week 2: SLP Short Term Goal 1 (Week 2): Pt will demonstrate topic maintenance for ~3 turns with Max A verbal cues for redirection  SLP Short Term Goal 2 (Week 2): Pt will demonstrate sustained attention to a functional task for 5 minutes with Max A cues for redirection  SLP Short Term Goal 3 (Week 2): Pt will orient to place, time and situation with Max A multimodal cues  SLP Short Term Goal 4 (Week 2): Pt will self-monitor and correct verbal errors with Max A multimodal cueing at the phrase level   Skilled Therapeutic Interventions: Treatment focus on cognitive-linguistic goals. SLP facilitated session by providing Min-Mod A question and visual cues to self-monitor and correct verbal errors. Pt also demonstrated increased topic maintenance while utilizing a personal photo album to describe pictures. Pt continues to demonstrate intermittent language of confusion throughout the session but was more verbally appropriate and was able to express basic wants/needs in regards to needing to use the bathroom. Pt's daughter was also present throughout the session. Continue plan of care.    FIM:  Comprehension Comprehension Mode: Auditory Comprehension: 4-Understands basic 75 - 89% of the time/requires cueing 10 - 24% of the time Expression Expression Mode: Verbal Expression: 3-Expresses basic 50 - 74% of the time/requires cueing 25 - 50% of the time. Needs to repeat parts of sentences. Social Interaction Social Interaction: 3-Interacts appropriately 50 - 74% of the time - May be physically or verbally inappropriate. Problem Solving Problem Solving: 2-Solves basic 25 - 49% of the time - needs direction more than half the time to initiate, plan or complete simple  activities Memory Memory: 2-Recognizes or recalls 25 - 49% of the time/requires cueing 51 - 75% of the time  Pain Intermittent pain throughout session, pt repositioned herself appropriately   Therapy/Group: Individual Therapy  Indio Santilli 06/03/2013, 1:25 PM

## 2013-06-03 NOTE — Progress Notes (Signed)
Occupational Therapy Note  Patient Details  Name: Ann Kaufman MRN: [id number] Date of Birth: 1930-04-13 Today's Date: 06/03/2013    Pt missed 60 mins skilled OT services.  MD in patient's room conferring with daughter and patient regarding discharge plans.  Therapist discussed with daughter that nursing would assist patient with bathing and dressing.  Notified RN and NT that patient would need assistance with bathing and dressing secondary to missed OT time.  Lavone Neri [facility] 06/03/2013, 8:52 AM

## 2013-06-03 NOTE — Progress Notes (Addendum)
Subjective/Complaints: Had a reasonable night after she settled down yesterday  A 12 point review of systems has been performed and if not noted above is otherwise negative.   Objective: Vital Signs: Blood pressure 161/74, pulse 69, temperature 97.6 F (36.4 C), temperature source Oral, resp. rate 19, weight 71.6 kg (157 lb 13.6 oz), SpO2 99.00%. No results found. No results found for this basename: WBC, HGB, HCT, PLT,  in the last 72 hours  Recent Labs  06/03/13 0705  NA 136  K 4.0  CL 100  GLUCOSE 92  BUN 14  CREATININE 0.87  CALCIUM 8.9   CBG (last 3)  No results found for this basename: GLUCAP,  in the last 72 hours  Wt Readings from Last 3 Encounters:  06/03/13 71.6 kg (157 lb 13.6 oz)  05/24/13 78.291 kg (172 lb 9.6 oz)  04/06/13 74.481 kg (164 lb 3.2 oz)    Physical Exam:  Constitutional: She is oriented to person, place, and time. She appears well-developed and well-nourished.  HENT:  Head: Normocephalic and atraumatic.  Eyes: Conjunctivae and EOM are normal. Pupils are equal, round, and reactive to light. Right eye exhibits no discharge. Left eye exhibits no discharge. No scleral icterus.  Neck: Normal range of motion. Neck supple.  Cardiovascular: Normal rate and regular rhythm. Exam reveals no gallop and no friction rub.  No murmur heard.  Respiratory: Effort normal. No respiratory distress. She has no wheezes.  GI: Soft. Bowel sounds are normal. She exhibits no distension. There is no tenderness.  Musculoskeletal: She exhibits no edema. Minimal low back pain today at rest  Neurological: She is drowsy, distracted, confused. Limited insight and awareness Right facial weakness. Speech with dysarthria. Language is aphasic. Sometime language of confusion? Extremely alert. Follows simple commands.  Skin: Skin is warm and dry.  A few limb bruises noted  Psychiatric: confused, calm/non-agitated   Assessment/Plan: 1. Functional deficits secondary to left  frontal hematoma which require 3+ hours per day of interdisciplinary therapy in a comprehensive inpatient rehab setting. Physiatrist is providing close team supervision and 24 hour management of active medical problems listed below. Physiatrist and rehab team continue to assess barriers to discharge/monitor patient progress toward functional and medical goals.  I spent EXTENSIVE time talking with the patient and her daughter this morning. She is looking better today, although she has tended to wax and wane. I spoke to them about the fact that there are multiple layers to her presentation, starting with her left frontal ICH and the associated language and cognitive effects, potential premorbid early dementia, hx of depression, eccentric, "sarcastic" personality, feelings of confinement here on rehab, and medication SE's. I explained to both the effects all of these issues have had which contributed to her clinical picture. Both agree that at this point she needs a change in scenery, and it appears that they are leaning toward going home with hired/24 hr assistance as opposed to SNF. I challenged the patient to use good judgement, and show better restraint and behavior around the staff, if she wishes to shed the waist belt and ultimately leave the rehab unit. She expressed and understanding and agreed. I will not pursue any further medications for agitation given her lack of response or refusal to take. A lot of her acting out is in reality, manipulation and premorbid personality, masked in her cognitive and language deficits.   I also discussed the communication gap which has apparently taken place between the daughter and team. We discussed potential methods  to improve this, which I have shared with nursing, SW, therapy, etc.   FIM: FIM - Bathing Bathing Steps Patient Completed: Chest;Right Arm;Left Arm;Abdomen Bathing: 2: Max-Patient completes 3-4 62f 10 parts or 25-49% (pt declined to bathe lower  body)  FIM - Upper Body Dressing/Undressing Upper body dressing/undressing steps patient completed: Thread/unthread right sleeve of pullover shirt/dresss;Thread/unthread left sleeve of pullover shirt/dress;Put head through opening of pull over shirt/dress;Pull shirt over trunk Upper body dressing/undressing: 5: Supervision: Safety issues/verbal cues FIM - Lower Body Dressing/Undressing Lower body dressing/undressing steps patient completed: Thread/unthread left pants leg;Thread/unthread right pants leg;Pull pants up/down;Don/Doff left sock;Don/Doff left shoe;Don/Doff right shoe Lower body dressing/undressing: 3: Mod-Patient completed 50-74% of tasks  FIM - Toileting Toileting steps completed by patient: Adjust clothing prior to toileting;Performs perineal hygiene Toileting Assistive Devices: Grab bar or rail for support Toileting: 3: Mod-Patient completed 2 of 3 steps  FIM - Diplomatic Services operational officer Devices: Grab bars;Walker Toilet Transfers: 4-From toilet/BSC: Min A (steadying Pt. > 75%);4-To toilet/BSC: Min A (steadying Pt. > 75%)  FIM - Bed/Chair Transfer Bed/Chair Transfer Assistive Devices: Therapist, occupational: 4: Bed > Chair or W/C: Min A (steadying Pt. > 75%);4: Chair or W/C > Bed: Min A (steadying Pt. > 75%)  FIM - Locomotion: Wheelchair Locomotion: Wheelchair: 0: Activity did not occur FIM - Locomotion: Ambulation Locomotion: Ambulation Assistive Devices: Designer, industrial/product Ambulation/Gait Assistance: 4: Min guard;4: Min assist Locomotion: Ambulation: 1: Travels less than 50 ft with minimal assistance (Pt.>75%)  Comprehension Comprehension Mode: Auditory Comprehension: 2-Understands basic 25 - 49% of the time/requires cueing 51 - 75% of the time  Expression Expression Mode: Verbal Expression: 2-Expresses basic 25 - 49% of the time/requires cueing 50 - 75% of the time. Uses single words/gestures.  Social Interaction Social Interaction: 2-Interacts  appropriately 25 - 49% of time - Needs frequent redirection.  Problem Solving Problem Solving: 2-Solves basic 25 - 49% of the time - needs direction more than half the time to initiate, plan or complete simple activities  Memory Memory: 2-Recognizes or recalls 25 - 49% of the time/requires cueing 51 - 75% of the time  Medical Problem List and Plan:  ICH with seizure activity--both CT's done last night showed improvement  -Recheck CT on monday 1. DVT Prophylaxis/Anticoagulation: Pharmaceutical: Lovenox  2. Pain Management: CT of lumbar spine shows multilevel spondylosis----mild stenosis. No fractures  -scheduled lidoderm patches  -oxycodone for pain, tylenol also  -k pad  -NEEDS TO BE OOB. 3. H/o depression/Mood: risperdal trial, added lexapro for depression. Pt is not happy being here but remains cognitively and linguistically impaired.    -risperdal stopped  -keppra decreased back to 500mg  to help reduce any neurosedation   -yesterday I changed her celexa to zoloft (which she took at home)  -waist belt needed at times for safety  -will attempt to use environmental mods and staff support to reduce confusion and agitation  4. Neuropsych: This patient is not capable of making decisions on her own behalf at this moment  5. A fib with PPM: Continue amiodarone and metoprolol. No anticoagulation at this time and f/u with cards in [place].   -appreciate cards follow up requested given--pacer interrogation in regard to Friday's unresponsive episode 7. New onset seizure: on keppra bid--level therapeutic 8. Aspiration PNA:abx dc'ed   LOS (Days) 10 A FACE TO FACE EVALUATION WAS PERFORMED  SWARTZ,ZACHARY T 06/03/2013 9:40 AM

## 2013-06-03 NOTE — Progress Notes (Signed)
Physical Therapy Session Note  Patient Details  Name: Ann Kaufman MRN: [id number] Date of Birth: [birth year]  Today's Date: 06/03/2013 Time: 4098-1191 Time Calculation (min): 45 min  Short Term Goals: Week 2:  PT Short Term Goal 1 (Week 2): STGs=LTGs due to anticipated LOS  Skilled Therapeutic Interventions/Progress Updates:  1:1. Pt received standing at sink w/ nursing and daughter in room, completing ADLs at sink. Pt w/ increased alertness and req decreased encouragement for participation this session, however, continues to demonstrate poor orientation to situation. Focus this session on ambulation w/ HHA due to decreased pain as well as sustained/selective attention to therapeutic tasks. Pt able to amb 150-200'x4 this session w/ HHA and min guard-min A 2/2 multiple mild episodes of LOB due to  animated behavior correlating to distraction from internal thoughts. Pt demonstrating very tangential and sarcastic conversation throughout tx session. Pt able to demonstrated improved ability to sustain attention to therapeutic tasks in a quiet environment such as setting up a radio in her room or reviewing pictures in a photo book, but req min-mod verbal cues for redirection of task w/ pt frequently repeating details. Pt only able to selectively attend >5-10 seconds to bouncing ball in mildly busy hall despite redirection by therapist and pt's daughter. Pt only able to visually pathfind room at end of session w/ mod verbal cues. Pt sitting in recliner at end of session w/ SLP present to take over session.   Therapy Documentation Precautions:  Precautions Precautions: Fall Restrictions Weight Bearing Restrictions: No General: Amount of Missed PT Time (min): 15 Minutes Missed Time Reason: Nursing care  See FIM for current functional status  Therapy/Group: Individual Therapy  Denzil Hughes 06/03/2013, 12:23 PM

## 2013-06-03 NOTE — Progress Notes (Signed)
Social Work Patient ID: Ann Kaufman, female   DOB: 02/01/1930, 79 y.o.   MRN: [id number]  CSW met with pt and her dtr, [name], to check in on how pt was doing today and if they had any concerns or questions for CSW.  Dtr stated that pt was having a much better day and that they had just spoken with Bayard Hugger, Nursing Director and had a lot of questions answered and information relayed.  They had no needs for CSW to address, so CSW provided them with items needed for lunch and gave them this CSW's phone number should needs arise.  Dtr and pt were appreciative of visit.

## 2013-06-03 NOTE — Consult Note (Signed)
NEURO HOSPITALIST CONSULT NOTE    Reason for Consult: intermittent confusion  HPI:                                                                                                                                          Ann Kaufman is an 79 y.o. female with history of atrial fibrillation and pacemaker placement, anticoagulation on Xarelto, and hypertension who was brought to the emergency room after developing speech difficulty and right facial droop as well as seizure activity on 05/18/13. At that time CT showed a 3.6 cm left frontal intraparenchymal hematoma. Patient has remained on Keppra while in CIR.  Neurology was asked to revisit patient due to fluctuating agitation, confusion, sarcastic behavior. Initially this was more common in the afternoon and late evening but per therapy this is occuring throughout the day now.They are wondering if there could be some subclinical seizure activity or etiology causing this behavior.  In speaking with family today patient is the best she has been in weeks.  Daughter feels that the cessation of respiradol and Seroquel hs helped significantly.  They are concerned that the pain medications may have also ben playing a role. Currently patient is awake, oriented however she does seem to get events that have occurred throughout the day confused and describes them as dream like event.  Daughters in room are present and state the event did occur but not exactly in the time line or manner patient describes.  She is not agitated.   Past Medical History  Diagnosis Date  . Intracerebral hemorrhage     a. 04/2013 in setting of xarelto therapy.  . Atrial fibrillation     a. Dx in 2012;  b. Rhytm controlled - amiodarone, previously anticoagulated with xarelto (d/c'd 04/2013 in setting of ICH);  b. 04/2013 Echo: EF 55-60%, Gr 1 DD, mild MR, mod dil LA, PAsP .  . Hypertension   . Depression   . Arthritis   . Presence of permanent cardiac  pacemaker     a. 2012 MDT, placed in [place] (probable tachy-brady).  . Pacemaker     Past Surgical History  Procedure Laterality Date  . Pacemaker insertion    . Abdominal hysterectomy    . Breast lumpectomy    . Appendectomy    . Bowel resection      Family History  Problem Relation Age of Onset  . Other      negative for premature CAD.     Social History:  reports that she quit smoking about 44 years ago. She does not have any smokeless tobacco history on file. She reports that she does not drink alcohol or use illicit drugs.  No Known Allergies  MEDICATIONS:  Scheduled: . amiodarone  200 mg Oral Daily  . antiseptic oral rinse  15 mL Mouth Rinse q12n4p  . famotidine  20 mg Oral BID  . levETIRAcetam  500 mg Oral BID  . lidocaine  2 patch Transdermal Q24H  . metoprolol tartrate  25 mg Oral BID  . oxyCODONE  5 mg Oral BID  . senna-docusate  2 tablet Oral QHS  . sertraline  50 mg Oral QHS     ROS:                                                                                                                                       History obtained from the patient  General ROS: negative for - chills, fatigue, fever, night sweats, weight gain or weight loss Psychological ROS: negative for - behavioral disorder, hallucinations, memory difficulties, mood swings or suicidal ideation Ophthalmic ROS: negative for - blurry vision, double vision, eye pain or loss of vision ENT ROS: negative for - epistaxis, nasal discharge, oral lesions, sore throat, tinnitus or vertigo Allergy and Immunology ROS: negative for - hives or itchy/watery eyes Hematological and Lymphatic ROS: negative for - bleeding problems, bruising or swollen lymph nodes Endocrine ROS: negative for - galactorrhea, hair pattern changes, polydipsia/polyuria or temperature intolerance Respiratory ROS:  negative for - cough, hemoptysis, shortness of breath or wheezing Cardiovascular ROS: negative for - chest pain, dyspnea on exertion, edema or irregular heartbeat Gastrointestinal ROS: negative for - abdominal pain, diarrhea, hematemesis, nausea/vomiting or stool incontinence Genito-Urinary ROS: negative for - dysuria, hematuria, incontinence or urinary frequency/urgency Musculoskeletal ROS: negative for - joint swelling or muscular weakness Neurological ROS: as noted in HPI Dermatological ROS: negative for rash and skin lesion changes   Blood pressure 161/74, pulse 69, temperature 97.6 F (36.4 C), temperature source Oral, resp. rate 19, weight 71.6 kg (157 lb 13.6 oz), SpO2 99.00%.   Neurologic Examination:                                                                                                      Mental Status: Alert, oriented, thought content appropriate but tangential and often broken in sequence.  Speech fluent without evidence of aphasia.  Able to follow 3 step commands without difficulty. Cranial Nerves: II: Discs flat bilaterally; Visual fields grossly normal, pupils equal, round, reactive to light and accommodation III,IV, VI: ptosis not present, extra-ocular motions intact bilaterally V,VII: smile symmetric, facial light touch sensation normal bilaterally VIII: hearing normal bilaterally IX,X: gag reflex  present XI: bilateral shoulder shrug XII: midline tongue extension without atrophy or fasciculations  Motor: Right : Upper extremity   5/5    Left:     Upper extremity   5/5  Lower extremity   5/5     Lower extremity   5/5 Tone and bulk:normal tone throughout; no atrophy noted Sensory: Pinprick and light touch intact throughout, bilaterally Deep Tendon Reflexes:  Right: Upper Extremity   Left: Upper extremity   biceps (C-5 to C-6) 2/4   biceps (C-5 to C-6) 2/4 tricep (C7) 2/4    triceps (C7) 2/4 Brachioradialis (C6) 2/4  Brachioradialis (C6) 2/4  Lower  Extremity Lower Extremity  quadriceps (L-2 to L-4) 2/4   quadriceps (L-2 to L-4) 2/4 Achilles (S1) 2/4   Achilles (S1) 2/4  Plantars: Right: downgoing   Left: up Cerebellar: normal finger-to-nose,  normal heel-to-shin test CV: pulses palpable throughout    No results found for this basename: cbc, bmp, coags, chol, tri, ldl, hga1c    Results for orders placed during the hospital encounter of 05/24/13 (from the past 48 hour(s))  BASIC METABOLIC PANEL     Status: Abnormal   Collection Time    06/03/13  7:05 AM      Result Value Range   Sodium 136  135 - 145 mEq/L   Potassium 4.0  3.5 - 5.1 mEq/L   Chloride 100  96 - 112 mEq/L   CO2 25  19 - 32 mEq/L   Glucose, Bld 92  70 - 99 mg/dL   BUN 14  6 - 23 mg/dL   Creatinine, Ser 1.61  0.50 - 1.10 mg/dL   Calcium 8.9  8.4 - 09.6 mg/dL   GFR calc non Af Amer 61 (*) >90 mL/min   GFR calc Af Amer 71 (*) >90 mL/min   Comment: (NOTE)     The eGFR has been calculated using the CKD EPI equation.     This calculation has not been validated in all clinical situations.     eGFR's persistently <90 mL/min signify possible Chronic Kidney     Disease.    No results found.  Felicie Morn PA-C [facility] [phone]  06/03/2013, 12:58 PM   Assessment/Plan: 79 YO female S/P seizure and left frontal hemorrhagic infarct.  While in Rehab, patient has exhibited fluctuations in behavior and agitation.  Discussing with daughters, much of these behaviors have improved since the cessation of Seroquel, Respiradone and sedative medications. In fact, today has been her best day yet.  Description of events do not sound epileptic. Per family behavior has improved and today resolved.   Recommend:  1) Agree with decreasing potentially sedating medications  Would not recommend any further neurodiagnostic testing at this time.   Assessment and plan discussed with attending neurologist  I personally participate in this patient's evaluation and  management, including formulating above clinical assessment as well as management recommendations.  Venetia Maxon M.D. [facility] [phone]

## 2013-06-03 NOTE — Progress Notes (Signed)
Physical Therapy Note  Patient Details  Name: Ann Kaufman MRN: [id number] Date of Birth: Nov 21, 1929 Today's Date: 06/03/2013  Time: 1300-1330 30 minutes  1:1 No c/o pain. Treatment focused on pt/family ed with pt's daughter.  Pt/daughter safely demo'd stand pivot transfers, gait up to 100' with RW, squat pivot transfers to furniture. Pt/daughter educated on pt's need for increased hands on assistance when fatigued or when in distracting environment.  Discussed need for "quiet time" for pt to rest to reduce fatigue.  Pt's daughter verbalizes understanding.   DONAWERTH,KAREN 06/03/2013, 1:58 PM

## 2013-06-04 ENCOUNTER — Inpatient Hospital Stay (HOSPITAL_COMMUNITY): Payer: Medicare Other

## 2013-06-04 DIAGNOSIS — J159 Unspecified bacterial pneumonia: Secondary | ICD-10-CM

## 2013-06-04 DIAGNOSIS — I619 Nontraumatic intracerebral hemorrhage, unspecified: Secondary | ICD-10-CM

## 2013-06-04 DIAGNOSIS — I10 Essential (primary) hypertension: Secondary | ICD-10-CM

## 2013-06-04 DIAGNOSIS — I4891 Unspecified atrial fibrillation: Secondary | ICD-10-CM

## 2013-06-04 NOTE — Progress Notes (Addendum)
Physical Therapy Session Note  Patient Details  Name: Ann Kaufman MRN: [id number] Date of Birth: 08-Apr-1930  Today's Date: 06/04/2013 Time: 4098-1191 Time Calculation (min): 45 min  Skilled Therapeutic Interventions/Progress Updates:  1:1. Pt received supine in bed, resting. Pt req min guard assist for toileting x2 this session. Pt able to amb 150'x2 w/ min HHA room<>therapy gym, demonstrating hard weight shifts B 2/2 pain. Pt verbalized increased comfort when amb w/ RW vs. No AD. Pt able to engage in game of checkers to target sustained attention for mod cueing for redirection to task. Pt making up own rules for moving pieces and unable to be follow basic rules. Pt's daughter present at end of tx session, emphasis on family training w/ daughter assisting pt w/ mobility in room including t/f sit<>stand, amb w/ RW and toileting. Daughter providing appropriate min A and verbal cues for safety. Pt's daughter cleared to assist pt w/ mobility in room only, RN aware. Encouraged use of w/c outside room 2/2 pain/fatigue. Pt's daughter verbalized understanding. Pt in care of daughter in room at end of session.    Therapy Documentation Precautions:  Precautions Precautions: Fall Restrictions Weight Bearing Restrictions: No General:     Pain: Pain Assessment Pain Assessment: Faces Faces Pain Scale: Hurts a little bit Pain Location: Back Pain Descriptors / Indicators: Aching Pain Intervention(s): Distraction   See FIM for current functional status  Therapy/Group: Individual Therapy  Denzil Hughes 06/04/2013, 4:22 PM

## 2013-06-04 NOTE — Progress Notes (Signed)
Subjective/Complaints: Pt resting but arouses easily to voice.Smiles Oriented x 0, (I'm Mrs. Wynn Banker) Follows commands  A 12 point review of systems has been performed and if not noted above is otherwise negative.   Objective: Vital Signs: Blood pressure 149/78, pulse 60, temperature 98 F (36.7 C), temperature source Oral, resp. rate 17, weight 69.8 kg (153 lb 14.1 oz), SpO2 97.00%. No results found. No results found for this basename: WBC, HGB, HCT, PLT,  in the last 72 hours  Recent Labs  06/03/13 0705  NA 136  K 4.0  CL 100  GLUCOSE 92  BUN 14  CREATININE 0.87  CALCIUM 8.9   CBG (last 3)  No results found for this basename: GLUCAP,  in the last 72 hours  Wt Readings from Last 3 Encounters:  06/04/13 69.8 kg (153 lb 14.1 oz)  05/24/13 78.291 kg (172 lb 9.6 oz)  04/06/13 74.481 kg (164 lb 3.2 oz)    Physical Exam:  Constitutional: She is oriented to person, place, and time. She appears well-developed and well-nourished.  HENT:  Head: Normocephalic and atraumatic.  Eyes: Conjunctivae and EOM are normal. Pupils are equal, round, and reactive to light. Right eye exhibits no discharge. Left eye exhibits no discharge. No scleral icterus.  Neck: Normal range of motion. Neck supple.  Cardiovascular: Normal rate and regular rhythm. Exam reveals no gallop and no friction rub.  No murmur heard.  Respiratory: Effort normal. No respiratory distress. She has no wheezes.  GI: Soft. Bowel sounds are normal. She exhibits no distension. There is no tenderness.  Musculoskeletal: She exhibits no edema. Minimal low back pain today at rest  Neurological: She is drowsy, distracted, confused. Limited insight and awareness Right facial weakness. Speech with dysarthria. Language is aphasic. Sometime language of confusion? Extremely alert. Follows simple commands.  Skin: Skin is warm and dry.  A few limb bruises noted  Psychiatric: confused, calm/non-agitated   Assessment/Plan: 1.  Functional deficits secondary to left frontal hematoma which require 3+ hours per day of interdisciplinary therapy in a comprehensive inpatient rehab setting. Physiatrist is providing close team supervision and 24 hour management of active medical problems listed below. Physiatrist and rehab team continue to assess barriers to discharge/monitor patient progress toward functional and medical goals.   FIM: FIM - Bathing Bathing Steps Patient Completed: Chest;Right Arm;Left Arm;Abdomen Bathing: 2: Max-Patient completes 3-4 71f 10 parts or 25-49% (pt declined to bathe lower body)  FIM - Upper Body Dressing/Undressing Upper body dressing/undressing steps patient completed: Thread/unthread right sleeve of pullover shirt/dresss;Thread/unthread left sleeve of pullover shirt/dress;Put head through opening of pull over shirt/dress;Pull shirt over trunk Upper body dressing/undressing: 5: Supervision: Safety issues/verbal cues FIM - Lower Body Dressing/Undressing Lower body dressing/undressing steps patient completed: Thread/unthread left pants leg;Thread/unthread right pants leg;Pull pants up/down;Don/Doff left sock;Don/Doff left shoe;Don/Doff right shoe Lower body dressing/undressing: 3: Mod-Patient completed 50-74% of tasks  FIM - Toileting Toileting steps completed by patient: Adjust clothing prior to toileting;Performs perineal hygiene Toileting Assistive Devices: Grab bar or rail for support Toileting: 3: Mod-Patient completed 2 of 3 steps  FIM - Diplomatic Services operational officer Devices: Grab bars;Walker Toilet Transfers: 4-From toilet/BSC: Min A (steadying Pt. > 75%);4-To toilet/BSC: Min A (steadying Pt. > 75%)  FIM - Bed/Chair Transfer Bed/Chair Transfer Assistive Devices: Therapist, occupational: 4: Bed > Chair or W/C: Min A (steadying Pt. > 75%);4: Chair or W/C > Bed: Min A (steadying Pt. > 75%)  FIM - Locomotion: Wheelchair Locomotion: Wheelchair: 0: Activity did not  occur FIM - Locomotion: Ambulation Locomotion: Ambulation Assistive Devices: Other (comment) (HHA) Ambulation/Gait Assistance: 4: Min guard;4: Min assist Locomotion: Ambulation: 4: Travels 150 ft or more with minimal assistance (Pt.>75%)  Comprehension Comprehension Mode: Auditory Comprehension: 4-Understands basic 75 - 89% of the time/requires cueing 10 - 24% of the time  Expression Expression Mode: Verbal Expression: 3-Expresses basic 50 - 74% of the time/requires cueing 25 - 50% of the time. Needs to repeat parts of sentences.  Social Interaction Social Interaction: 3-Interacts appropriately 50 - 74% of the time - May be physically or verbally inappropriate.  Problem Solving Problem Solving: 2-Solves basic 25 - 49% of the time - needs direction more than half the time to initiate, plan or complete simple activities  Memory Memory: 2-Recognizes or recalls 25 - 49% of the time/requires cueing 51 - 75% of the time  Medical Problem List and Plan:  ICH with seizure activity--both CT's done last night showed improvement  -Recheck CT on monday 1. DVT Prophylaxis/Anticoagulation: Pharmaceutical: Lovenox  2. Pain Management: CT of lumbar spine shows multilevel spondylosis----mild stenosis. No fractures  -scheduled lidoderm patches  -oxycodone for pain, tylenol also  -k pad  -NEEDS TO BE OOB. 3. H/o depression/Mood:  -risperdal stopped  -keppra decreased back to 500mg  to help reduce any neurosedation   - zoloft (which she took at home)  -waist belt needed at times for safety, monitor and d/c if no attempts at OOB without sup  -will attempt to use environmental mods and staff support to reduce confusion and agitation  4. Neuropsych: This patient is not capable of making decisions on her own behalf at this moment  5. A fib with PPM: Continue amiodarone and metoprolol. No anticoagulation at this time and f/u with cards in [place].   -appreciate cards follow up requested given--pacer  interrogation in regard to Friday's unresponsive episode 7. New onset seizure: on keppra bid--level therapeutic, Appreciate Neuro consult 8. Aspiration PNA:abx dc'ed   LOS (Days) 11 A FACE TO FACE EVALUATION WAS PERFORMED  Erick Colace 06/04/2013 7:39 AM

## 2013-06-05 ENCOUNTER — Inpatient Hospital Stay (HOSPITAL_COMMUNITY): Payer: Medicare Other | Admitting: Physical Therapy

## 2013-06-05 LAB — CBC
Hemoglobin: 12.2 g/dL (ref 12.0–15.0)
MCHC: 32.5 g/dL (ref 30.0–36.0)
RDW: 14.5 % (ref 11.5–15.5)
WBC: 7.3 10*3/uL (ref 4.0–10.5)

## 2013-06-05 LAB — BASIC METABOLIC PANEL
Calcium: 8.9 mg/dL (ref 8.4–10.5)
Chloride: 100 mEq/L (ref 96–112)
GFR calc Af Amer: 63 mL/min — ABNORMAL LOW (ref >90)
GFR calc non Af Amer: 55 mL/min — ABNORMAL LOW (ref >90)
Potassium: 4.1 mEq/L (ref 3.5–5.1)
Sodium: 137 mEq/L (ref 135–145)

## 2013-06-05 NOTE — Progress Notes (Signed)
Pt's daughter concerned that Pt's affect, attention, and energy are so dramatically changed from yesterday evening.  Pt very lethargic, responses delayed, difficult to rouse.  VSS, MD aware.  Orders received and carried out.  See lab results.  Will con't plan of care.

## 2013-06-05 NOTE — Progress Notes (Signed)
Subjective/Complaints: Bright this am. Oriented x person , month, year , not day and date.  "I am at daughter's home" Follows commands  A 12 point review of systems has been performed and if not noted above is otherwise negative.   Objective: Vital Signs: Blood pressure 131/66, pulse 69, temperature 97.9 F (36.6 C), temperature source Oral, resp. rate 17, weight 71.8 kg (158 lb 4.6 oz), SpO2 98.00%. No results found. No results found for this basename: WBC, HGB, HCT, PLT,  in the last 72 hours  Recent Labs  06/03/13 0705  NA 136  K 4.0  CL 100  GLUCOSE 92  BUN 14  CREATININE 0.87  CALCIUM 8.9   CBG (last 3)  No results found for this basename: GLUCAP,  in the last 72 hours  Wt Readings from Last 3 Encounters:  06/05/13 71.8 kg (158 lb 4.6 oz)  05/24/13 78.291 kg (172 lb 9.6 oz)  04/06/13 74.481 kg (164 lb 3.2 oz)    Physical Exam:  Constitutional: She is oriented to person, place, and time. She appears well-developed and well-nourished.  HENT:  Head: Normocephalic and atraumatic.  Eyes: Conjunctivae and EOM are normal. Pupils are equal, round, and reactive to light. Right eye exhibits no discharge. Left eye exhibits no discharge. No scleral icterus.  Neck: Normal range of motion. Neck supple.  Cardiovascular: Normal rate and regular rhythm. Exam reveals no gallop and no friction rub.  No murmur heard.  Respiratory: Effort normal. No respiratory distress. She has no wheezes.  GI: Soft. Bowel sounds are normal. She exhibits no distension. There is no tenderness.  Musculoskeletal: She exhibits no edema. Minimal low back pain today at rest  Neurological: She is drowsy, distracted, confused. Limited insight and awareness Right facial weakness. Speech with dysarthria. Language is aphasic. Sometime language of confusion? Extremely alert. Follows simple commands.  Skin: Skin is warm and dry.  A few limb bruises noted  Psychiatric: confused,  calm/non-agitated   Assessment/Plan: 1. Functional deficits secondary to left frontal hematoma which require 3+ hours per day of interdisciplinary therapy in a comprehensive inpatient rehab setting. Physiatrist is providing close team supervision and 24 hour management of active medical problems listed below. Physiatrist and rehab team continue to assess barriers to discharge/monitor patient progress toward functional and medical goals.   FIM: FIM - Bathing Bathing Steps Patient Completed: Chest;Right Arm;Left Arm;Abdomen Bathing: 2: Max-Patient completes 3-4 45f 10 parts or 25-49% (pt declined to bathe lower body)  FIM - Upper Body Dressing/Undressing Upper body dressing/undressing steps patient completed: Thread/unthread right sleeve of pullover shirt/dresss;Thread/unthread left sleeve of pullover shirt/dress;Put head through opening of pull over shirt/dress;Pull shirt over trunk Upper body dressing/undressing: 5: Supervision: Safety issues/verbal cues FIM - Lower Body Dressing/Undressing Lower body dressing/undressing steps patient completed: Thread/unthread left pants leg;Thread/unthread right pants leg;Pull pants up/down;Don/Doff left sock;Don/Doff left shoe;Don/Doff right shoe Lower body dressing/undressing: 3: Mod-Patient completed 50-74% of tasks  FIM - Toileting Toileting steps completed by patient: Adjust clothing prior to toileting;Performs perineal hygiene;Adjust clothing after toileting Toileting Assistive Devices: Grab bar or rail for support Toileting: 4: Steadying assist  FIM - Diplomatic Services operational officer Devices: Grab bars;Walker Toilet Transfers: 4-From toilet/BSC: Min A (steadying Pt. > 75%);4-To toilet/BSC: Min A (steadying Pt. > 75%)  FIM - Bed/Chair Transfer Bed/Chair Transfer Assistive Devices: Therapist, occupational: 5: Supine > Sit: Supervision (verbal cues/safety issues);4: Sit > Supine: Min A (steadying pt. > 75%/lift 1 leg);4: Bed > Chair  or W/C: Min A (steadying Pt. >  75%);4: Chair or W/C > Bed: Min A (steadying Pt. > 75%)  FIM - Locomotion: Wheelchair Locomotion: Wheelchair: 0: Activity did not occur FIM - Locomotion: Ambulation Locomotion: Ambulation Assistive Devices: Other (comment) (HHA) Ambulation/Gait Assistance: 4: Min guard;4: Min assist Locomotion: Ambulation: 4: Travels 150 ft or more with minimal assistance (Pt.>75%)  Comprehension Comprehension Mode: Auditory Comprehension: 4-Understands basic 75 - 89% of the time/requires cueing 10 - 24% of the time  Expression Expression Mode: Verbal Expression: 3-Expresses basic 50 - 74% of the time/requires cueing 25 - 50% of the time. Needs to repeat parts of sentences.  Social Interaction Social Interaction: 3-Interacts appropriately 50 - 74% of the time - May be physically or verbally inappropriate.  Problem Solving Problem Solving: 2-Solves basic 25 - 49% of the time - needs direction more than half the time to initiate, plan or complete simple activities  Memory Memory: 2-Recognizes or recalls 25 - 49% of the time/requires cueing 51 - 75% of the time  Medical Problem List and Plan:  ICH with seizure activity--both CT's done last night showed improvement  -Recheck CT on monday 1. DVT Prophylaxis/Anticoagulation: Pharmaceutical: Lovenox  2. Pain Management: CT of lumbar spine shows multilevel spondylosis----mild stenosis. No fractures  -scheduled lidoderm patches  -oxycodone for pain, tylenol also  -k pad   3. H/o depression/Mood:  -risperdal stopped  -keppra decreased back to 500mg  to help reduce any neurosedation   - zoloft (which she took at home)  -waist belt needed at times for safety, monitor and d/c if no attempts at OOB without sup  -will attempt to use environmental mods and staff support to reduce confusion and agitation  4. Neuropsych: This patient is not capable of making decisions on her own behalf at this moment  5. A fib with PPM: Continue  amiodarone and metoprolol. No anticoagulation at this time and f/u with cards in [place].   -pacer interrogation in regard to Friday's unresponsive episode 7. New onset seizure: on keppra bid--level therapeutic, Appreciate Neuro consult 8. Aspiration PNA:abx dc'ed   LOS (Days) 12 A FACE TO FACE EVALUATION WAS PERFORMED  Erick Colace 06/05/2013 7:52 AM

## 2013-06-05 NOTE — Progress Notes (Signed)
Physical Therapy Note  Patient Details  Name: Ann Kaufman MRN: [id number] Date of Birth: Nov 01, 1929 Today's Date: 06/05/2013  1500 -1540 (40 minutes) individual Pain: no reported pain Other: Pt oriented to person , day of week, month, vcs for place Focus of treatment: gait training / activity tolerance; therapeutic activity focused on sustained attention to task.  Treatment: pt in bed upon arrival , initially very tearful - " My kids may be dead" ; with encouragement pt agreeable to PT session; gait 100 feet X 2 RW close SBA; therapeutic activity - card sorting as to suite with 100% accuracy; returned to bed with bed alarm activated.    Minyon Billiter,JIM 06/05/2013, 3:06 PM

## 2013-06-06 ENCOUNTER — Inpatient Hospital Stay (HOSPITAL_COMMUNITY): Payer: Medicare Other

## 2013-06-06 ENCOUNTER — Encounter (HOSPITAL_COMMUNITY): Payer: Self-pay | Admitting: Radiology

## 2013-06-06 ENCOUNTER — Inpatient Hospital Stay (HOSPITAL_COMMUNITY): Payer: Medicare Other | Admitting: Speech Pathology

## 2013-06-06 DIAGNOSIS — I619 Nontraumatic intracerebral hemorrhage, unspecified: Secondary | ICD-10-CM

## 2013-06-06 DIAGNOSIS — I4891 Unspecified atrial fibrillation: Secondary | ICD-10-CM

## 2013-06-06 DIAGNOSIS — S0003XA Contusion of scalp, initial encounter: Secondary | ICD-10-CM | POA: Diagnosis not present

## 2013-06-06 DIAGNOSIS — J159 Unspecified bacterial pneumonia: Secondary | ICD-10-CM

## 2013-06-06 DIAGNOSIS — I10 Essential (primary) hypertension: Secondary | ICD-10-CM

## 2013-06-06 LAB — URINALYSIS, ROUTINE W REFLEX MICROSCOPIC
Nitrite: NEGATIVE
Protein, ur: NEGATIVE mg/dL
Specific Gravity, Urine: 1.024 (ref 1.005–1.030)
Urobilinogen, UA: 1 mg/dL (ref 0.0–1.0)
pH: 5.5 (ref 5.0–8.0)

## 2013-06-06 LAB — URINE MICROSCOPIC-ADD ON

## 2013-06-06 NOTE — Consult Note (Signed)
Neuropsychology Psychosocial Evaluation - [facility] _____________________________________________________________________________  Per request from the treatment team and recommendation from prior neuropsychological report, I met with Ms. Joneisha Miles for a session to provide supportive therapy in the setting of intracranial hemorrhage.   Ms. Andes reported frustration surrounding the inability to "fix" her cognitive problems.  However, we were unable to engage in much discussion beyond that, because the content of her speech was perseverative around political topics and at times was incomprehensible.  In fact, she was unable to even respond to direct questions about her mood.  For example, in response to the question, "Do you feel sad?" she responded "I've been ornery."  She then proceeded to "explain" by discussing politics, which seemed to be unrelated.  Ms. Guhl was unable to explain why she was here in the hospital and in fact, did not understand that she was in a hospital when I explained it to her.  Aside from politics, she also mentioned several times that her speech was slurred and difficult to understand because she was "at the age where [she] is getting 'old timers.'"    Unfortunately, given Ms. Bertelson' demonstrated issues with comprehension and with expressive speech, we were not able to engage in a meaningful conversation regarding mood.  Her clinical presentation could be caused by multiple factors, including side effects from sedating medications or it could be representative of an underlying neurodegenerative condition (e.g. Alzheimer's disease).  In older age, head injuries or other major medical events can sometimes uncover existing pathology for which an individual was able to compensate until the medical event.    Before and after this session, I spent significant time (60 minutes total) with her daughter.  Initially, her daughter was expressing  concerns regarding communication with treating staff members and with family education.  However, we also spent time discussing her mother's condition, including the possibility of an underlying premorbid dementia.  Her daughter seemed receptive to this possibility.  Finally, we explored the role of fatigue, pain, mood disruption, and medication side-effects and how those factors can influence cognitive abilities.    Given that the exact etiology of Ms. Domenico' cognitive deficits is not entirely clear, we cannot reliably predict cognitive prognosis at this time.  Her physician may consider evaluating her current medication regimen to see if alterations could be made to reduce the number of medications that have fatigue as a side-effect.  In addition, follow-up with a neuropsychologist in 6 months will be useful in assessing for interval change that could help more clearly define the etiology of her deficits and predict prognosis for care management purposes.  Information for neuropsychologists in her area should be provided for this purpose.  Finally, if she is still on the unit next week, I will plan to see her in follow-up to evaluate for potential interval change.    DIAGNOSIS: Intracranial hemorrhage Depressive disorder, NOS  Greater than 50% of this extended visit was spent educating the patient about the possible diagnosis, prognosis, management plan, and in coordination of care.   Leavy Cella, PsyD Clinical Neuropsychologist

## 2013-06-06 NOTE — Progress Notes (Signed)
Pt was put back to restraint. 4 sides rails up and  Soft waist belt. Family Jani Gravel) was notified  this morning at 748 am

## 2013-06-06 NOTE — Progress Notes (Signed)
Physical Therapy Session Note  Patient Details  Name: Ann Kaufman MRN: [id number] Date of Birth: [birth year]  Today's Date: 06/06/2013 Time: 1100-1145 Time Calculation (min): 45 min  Short Term Goals: Week 2:  PT Short Term Goal 1 (Week 2): STGs=LTGs due to anticipated LOS  Skilled Therapeutic Interventions/Progress Updates:  1:1. Pt received standing at sink performing grooming w/ daughter in room providing close (S).  Pt req (S)-min A for donning shoes/socks w/ min verbal cues. Discussion w/ daughter at start of tx session regarding d/c planning including: f/u PT as well as safety in home environment- prevention of falls, safety at night (sleeping close to pt, bell on RW to notify family if pt is trying to get up). Daughter also w/ questions regarding bathroom setup, deferred to OT. Focus at end of session on gait training. Pt able to amb 175' w/ RW and close (S), slow but steady pace. Following demonstration by therapist w/ pt, pt's daughter able to provide safe min guard assist during curb step negotiation as well as negotiation up/down 6 steps w/ RW. Pt req min cues for safety/seq. Following performance of stair negotiation, pt verbalized not feeling well and requesting to sit. BP: 88/51, SaO2: 99%, HR: 74 in sitting; when assisted back to bed BP: 82/47, SaO2: 93%, HR: 62. Pt reported mild improvement regarding "not feeling well" in supine position. RN made aware and speaking w/ PA at nurses station. Pt supine in bed at end of session w/ all needs in reach, bed alarm on and daughter in room.   Therapy Documentation Precautions:  Precautions Precautions: Fall Restrictions Weight Bearing Restrictions: No Vital Signs: Therapy Vitals Pulse Rate: 62 BP: 82/47 mmHg (After lying supine ) Patient Position, if appropriate: Lying Oxygen Therapy SpO2: 93 % O2 Device: None (Room air) Pain: Pain Assessment Pain Assessment: No/denies pain  See FIM for current functional  status  Therapy/Group: Individual Therapy  Denzil Hughes 06/06/2013, 11:58 AM
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
## 2013-06-06 NOTE — Progress Notes (Signed)
Occupational Therapy Session Note  Patient Details  Name: Ann Kaufman MRN: [id number] Date of Birth: [birth year]  Today's Date: 06/06/2013 Time: 0800-0835 Time Calculation (min): 35 min  Short Term Goals: Week 2:   STG=LTG secondary to LOS  Skilled Therapeutic Interventions/Progress Updates:    Pt asleep in bed upon arrival but easily aroused.  Pt agreed to bathing at shower level this morning.  Pt not oriented to place, date, or situation.  Pt's daughter entered room while ambulating with RW into bathroom for shower.  Pt's daughter provided assistance and supervision during shower.  Pt required max verbal cues for task initiation and safety.  Transporter arrived at room during shower to transport patient for MRI so patient did not don clothing during this session.  Pt required steady A when standing and ambulating with multimodal cues for safety and attention to task.   Therapy Documentation Precautions:  Precautions Precautions: Fall Restrictions Weight Bearing Restrictions: No General: General Amount of Missed OT Time (min): 25 Minutes Pt off unit for MRI   Pain: Pain Assessment Pain Assessment: No/denies pain  See FIM for current functional status  Therapy/Group: Individual Therapy  Rich Brave 06/06/2013, 9:00 AM

## 2013-06-06 NOTE — Progress Notes (Signed)
Physical Therapy Session Note  Patient Details  Name: Ann Kaufman MRN: [id number] Date of Birth: [birth year]  Today's Date: 06/06/2013  Skilled Therapeutic Interventions/Progress Updates:  Pt missed 60 minutes of scheduled skilled physical therapy due to being off unit for imaging and then eating breakfast upon return. Will continue per current POC as appropriate.   Zerita Boers S 06/06/2013, 9:26 AM
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
## 2013-06-06 NOTE — Progress Notes (Signed)
Prunes juice was given to pt to help with bowel

## 2013-06-06 NOTE — Progress Notes (Signed)
Speech Language Pathology Daily Session Note  Patient Details  Name: Ann Kaufman MRN: [id number] Date of Birth: 10-11-1929  Today's Date: 06/06/2013 Time: 1300-1330 Time Calculation (min): 30 min  Short Term Goals: Week 2: SLP Short Term Goal 1 (Week 2): Pt will demonstrate topic maintenance for ~3 turns with Max A verbal cues for redirection  SLP Short Term Goal 2 (Week 2): Pt will demonstrate sustained attention to a functional task for 5 minutes with Max A cues for redirection  SLP Short Term Goal 3 (Week 2): Pt will orient to place, time and situation with Max A multimodal cues  SLP Short Term Goal 4 (Week 2): Pt will self-monitor and correct verbal errors with Max A multimodal cueing at the phrase level   Skilled Therapeutic Interventions: Treatment focus on cognitive-linguistic goals. SLP facilitated session by Max-Total A multimodal cueing for topic maintenance for ~2 turns throughout a functional conversation. Pt also required Mod-Max A question and verbal cues to self-monitor and correct verbal errors at the sentence level. Pt required total A to orient to place, time and situation and continues to demonstrate language of confusion.  Continue plan of care.    FIM:  Comprehension Comprehension Mode: Auditory Comprehension: 4-Understands basic 75 - 89% of the time/requires cueing 10 - 24% of the time Expression Expression Mode: Verbal Expression: 3-Expresses basic 50 - 74% of the time/requires cueing 25 - 50% of the time. Needs to repeat parts of sentences. Social Interaction Social Interaction: 3-Interacts appropriately 50 - 74% of the time - May be physically or verbally inappropriate. Problem Solving Problem Solving: 2-Solves basic 25 - 49% of the time - needs direction more than half the time to initiate, plan or complete simple activities Memory Memory: 2-Recognizes or recalls 25 - 49% of the time/requires cueing 51 - 75% of the time  Pain Pain Assessment Pain  Assessment: No/denies pain  Therapy/Group: Individual Therapy  Ann Kaufman 06/06/2013, 3:29 PM

## 2013-06-07 ENCOUNTER — Inpatient Hospital Stay (HOSPITAL_COMMUNITY): Payer: Medicare Other

## 2013-06-07 ENCOUNTER — Inpatient Hospital Stay (HOSPITAL_COMMUNITY): Payer: Medicare Other | Admitting: Speech Pathology

## 2013-06-07 MED ORDER — ASPIRIN EC 81 MG PO TBEC
81.0000 mg | DELAYED_RELEASE_TABLET | Freq: Every day | ORAL | Status: DC
  Administered 2013-06-07 – 2013-06-09 (×3): 81 mg via ORAL
  Filled 2013-06-07 (×4): qty 1

## 2013-06-07 NOTE — Progress Notes (Signed)
Dr. Pearlean Brownie has reviewed CT and recommends baby ASA for now. Further changes to be decided on follow up in 6-8 weeks past office visit.

## 2013-06-07 NOTE — Progress Notes (Signed)
Speech Language Pathology Daily Session Note  Patient Details  Name: Ann Kaufman MRN: [id number] Date of Birth: 1930-02-05  Today's Date: 06/07/2013 Time: 1015-1050 Time Calculation (min): 35 min  Short Term Goals: Week 2: SLP Short Term Goal 1 (Week 2): Pt will demonstrate topic maintenance for ~3 turns with Max A verbal cues for redirection  SLP Short Term Goal 2 (Week 2): Pt will demonstrate sustained attention to a functional task for 5 minutes with Max A cues for redirection  SLP Short Term Goal 3 (Week 2): Pt will orient to place, time and situation with Max A multimodal cues  SLP Short Term Goal 4 (Week 2): Pt will self-monitor and correct verbal errors with Max A multimodal cueing at the phrase level   Skilled Therapeutic Interventions: Treatment focus on cognitive-linguistic goals. Upon arrival, pt asleep in bed but was easily aroused. SLP facilitated session by providing Min-Mod verbal cues for emergent awareness into language impairments and decreased utilization of compensatory strategies for impaired word-finding and overall confusion.  Pt verbalized understanding and verbalized mildly complex thoughts/ideas with Min-Mod question and verbal cues self-monitor and correct verbal errors. Continue plan of care.    FIM:  Comprehension Comprehension Mode: Auditory Comprehension: 4-Understands basic 75 - 89% of the time/requires cueing 10 - 24% of the time Expression Expression Mode: Verbal Expression: 3-Expresses basic 50 - 74% of the time/requires cueing 25 - 50% of the time. Needs to repeat parts of sentences. Social Interaction Social Interaction: 3-Interacts appropriately 50 - 74% of the time - May be physically or verbally inappropriate. Problem Solving Problem Solving: 2-Solves basic 25 - 49% of the time - needs direction more than half the time to initiate, plan or complete simple activities Memory Memory: 2-Recognizes or recalls 25 - 49% of the time/requires cueing 51  - 75% of the time  Pain Pain Assessment Pain Assessment: No/denies pain  Therapy/Group: Individual Therapy  Rini Moffit 06/07/2013, 2:43 PM

## 2013-06-07 NOTE — Progress Notes (Addendum)
Physical Therapy Session Note  Patient Details  Name: Ann Kaufman MRN: [id number] Date of Birth: 11-06-1929  Today's Date: 06/07/2013 Time: Treatment Session 1: 0900-1000; Treatment Session 2: 1400-1430 Time Calculation (min): Treatment Session 1: 60 min; Treatment Session 2:  Short Term Goals: Week 2:  PT Short Term Goal 1 (Week 2): STGs=LTGs due to anticipated LOS  Skilled Therapeutic Interventions/Progress Updates:  Treatment Session 1:  1:1. Pt received sitting in chair, req mod encouragement/redirection for participation in therapy. Pt able to amb 250'x3 w/ supervision when using RW, however, req min guard-min A when amb 100' w/out AD 2/2 animated behavior leading to episodes of mild LOB. Pt req frequent min-mod verbal/tactile cues for attention/safety while ambulating in busy hall way environments as pt stopping to talk to everyone. Focus this session on making cookies in quiet-mildly busy therapy kitchen. Pt req mod-max verbal cues to sustain attention to task therefore req significantly increased time to prepare cookie mix. Pt demonstrated improved sustained attention w/ min verbal cues when washing dishes and placing them in dishwasher at end of session. Pt demonstrating excellent dynamic reaching in all directions, in/outside BOS while in kitchen w/ no LOB and close (S). Pt sitting in chair at end of session w/ nurse tech in room.   Treatment Session 2:  1:1. Pt received semi-reclined in bed, alert w/ daughter in room and easy to direct for engagement in therapy session. Pt demonstrating good recall of therapeutic tasks performed in AM session. Pt req (S) for t/f sup<>sit EOB w/ HOB flat. Focus this session on functional strength/endurance through amb and use of NuStep. Pt able to amb 200'x2 this session w/ RW and supervision. Pt demonstrating good pace w/ decreased verbal cues to attend to task in busy hallway environment compared to AM session. Pt w/ good tolerance to NuStep,  level 4x54min. Pt semi-recliend in bed at end of session w/ all needs in reach, soft waist belt in place, 4 rails up and bed alarm on.   Therapy Documentation Precautions:  Precautions Precautions: Fall Restrictions Weight Bearing Restrictions: No Pain: Pain Assessment Pain Assessment: No/denies pain  See FIM for current functional status  Therapy/Group: Individual Therapy  Denzil Hughes 06/07/2013, 12:03 PM

## 2013-06-07 NOTE — Progress Notes (Addendum)
Occupational Therapy Session Note  Patient Details  Name: Ann Kaufman MRN: [id number] Date of Birth: [birth year]  Today's Date: 06/07/2013  Session 1 Time: 0800-0900 Time Calculation (min): 60 min  Short Term Goals: Week 2:   STG=LTG secondary to LOS  Skilled Therapeutic Interventions/Progress Updates:    Pt engaged in ADL retraining with focus on participation, safety awareness, task initiation, safety awareness, functional ambulation without AD, dynamic standing balance, and attention to task.  Pt completed all tasks with supervision this morning requiring min verbal cues for attention to task and transitioning from one task to another.  Pt exhibited word finding difficulty and use incorrect words to complete thoughts approx 25% of time.  Patient's conversation often was nonsensical and not on subject of ongoing dialogue between therapist and patient. Pt was cooperative and actively engaged during entirety of session.   Therapy Documentation Precautions:  Precautions Precautions: Fall Restrictions Weight Bearing Restrictions: No Pain: Pain Assessment Pain Assessment: No/denies pain  See FIM for current functional status  Therapy/Group: Individual Therapy  Session 2 Time: 1300-1330 Pt denies pain Individual Therapy  Pt sitting in w/c with daughter upon arrival.  Pt amb to ADL apartment/kitchen to participate in dynamic standing tasks in kitchen in preparation for [employer] tomorrow.  Pt required min verbal cues to attend to and stay on task while engaged in kitchen activities.  Pt was able to locate her room after leaving kitchen without any assistance.  Pt completed all tasks without physical assistance but did require min verbal cues for safety awareness and for attention to task.  Lavone Neri [facility] 06/07/2013, 9:08 AM

## 2013-06-07 NOTE — Progress Notes (Signed)
Subjective/Complaints: No new issues. Awake and in good spirits. Pain controlled     A 12 point review of systems has been performed and if not noted above is otherwise negative.pt does have cognitive deficits that limit ROS   Objective: Vital Signs: Blood pressure 135/56, pulse 74, temperature 98.1 F (36.7 C), temperature source Oral, resp. rate 18, weight 69.2 kg (152 lb 8.9 oz), SpO2 99.00%. Ct Head Wo Contrast  06/06/2013   CLINICAL DATA:  Decreased cognition. History of left intracranial hemorrhage. Fall.  EXAM: CT HEAD WITHOUT CONTRAST  TECHNIQUE: Contiguous axial images were obtained from the base of the skull through the vertex without intravenous contrast.  COMPARISON:  05/27/2013 and 05/18/2013.  FINDINGS: The left frontal lobe hematoma has decreased in density and size now measuring 2.4 x 2.1 cm versus prior 3.3 x 2.5 cm. Surrounding vasogenic edema. Minimal impression upon the frontal horn of the left lateral ventricle and a very minimal bowing of the septum towards the right.  No new intracranial hemorrhage detected.  No acute thrombotic infarct.  Small vessel disease type changes.  No intracranial mass lesion seen separate from above described findings.  No skull fracture.  Orbital structures appear intact.  Vascular calcifications.  IMPRESSION: Decrease in size of a left frontal lobe hematoma as noted above. Surrounding vasogenic edema and mild local mass effect without significant change.  No new intracranial hemorrhage.   Electronically Signed   By: Bridgett Larsson M.D.   On: 06/06/2013 09:11    Recent Labs  06/05/13 1720  WBC 7.3  HGB 12.2  HCT 37.5  PLT 252    Recent Labs  06/05/13 1720  NA 137  K 4.1  CL 100  GLUCOSE 102*  BUN 15  CREATININE 0.95  CALCIUM 8.9   CBG (last 3)  No results found for this basename: GLUCAP,  in the last 72 hours  Wt Readings from Last 3 Encounters:  06/07/13 69.2 kg (152 lb 8.9 oz)  05/24/13 78.291 kg (172 lb 9.6 oz)  04/06/13  74.481 kg (164 lb 3.2 oz)    Physical Exam:  Constitutional: She is oriented to person, place, and time. She appears well-developed and well-nourished.  HENT:  Head: Normocephalic and atraumatic.  Eyes: Conjunctivae and EOM are normal. Pupils are equal, round, and reactive to light. Right eye exhibits no discharge. Left eye exhibits no discharge. No scleral icterus.  Neck: Normal range of motion. Neck supple.  Cardiovascular: Normal rate and regular rhythm. Exam reveals no gallop and no friction rub.  No murmur heard.  Respiratory: Effort normal. No respiratory distress. She has no wheezes.  GI: Soft. Bowel sounds are normal. She exhibits no distension. There is no tenderness.  Musculoskeletal: She exhibits no edema. Minimal low back pain today at rest  Neurological: She is drowsy, distracted, confused. Limited insight and awareness Right facial weakness. Speech with dysarthria. Language is aphasic, sometimes LOC.  Extremely alert. Follows simple commands.  Skin: Skin is warm and dry.  A few limb bruises noted  Psychiatric: confused, calm/non-agitated   Assessment/Plan: 1. Functional deficits secondary to left frontal hematoma which require 3+ hours per day of interdisciplinary therapy in a comprehensive inpatient rehab setting. Physiatrist is providing close team supervision and 24 hour management of active medical problems listed below. Physiatrist and rehab team continue to assess barriers to discharge/monitor patient progress toward functional and medical goals.  No new issues reported. Still working toward placement, likely SNF.  FIM: FIM - Bathing Bathing Steps Patient Completed:  Chest;Right Arm;Left Arm;Abdomen;Front perineal area;Right upper leg;Left upper leg;Right lower leg (including foot);Left lower leg (including foot);Buttocks Bathing: 4: Steadying assist  FIM - Upper Body Dressing/Undressing Upper body dressing/undressing steps patient completed: Thread/unthread right  bra strap;Thread/unthread left bra strap;Thread/unthread right sleeve of pullover shirt/dresss;Thread/unthread left sleeve of pullover shirt/dress;Put head through opening of pull over shirt/dress;Pull shirt over trunk Upper body dressing/undressing: 0: Activity did not occur FIM - Lower Body Dressing/Undressing Lower body dressing/undressing steps patient completed: Thread/unthread left pants leg;Pull pants up/down;Don/Doff left sock;Don/Doff right sock;Thread/unthread right pants leg Lower body dressing/undressing: 0: Activity did not occur  FIM - Toileting Toileting steps completed by patient: Adjust clothing prior to toileting;Performs perineal hygiene;Adjust clothing after toileting Toileting Assistive Devices: Grab bar or rail for support Toileting: 4: Steadying assist  FIM - Diplomatic Services operational officer Devices: Grab bars;Walker Toilet Transfers: 4-From toilet/BSC: Min A (steadying Pt. > 75%);4-To toilet/BSC: Min A (steadying Pt. > 75%)  FIM - Banker Devices: Walker;Arm rests Bed/Chair Transfer: 4: Sit > Supine: Min A (steadying pt. > 75%/lift 1 leg);4: Bed > Chair or W/C: Min A (steadying Pt. > 75%);4: Chair or W/C > Bed: Min A (steadying Pt. > 75%)  FIM - Locomotion: Wheelchair Locomotion: Wheelchair: 1: Total Assistance/staff pushes wheelchair (Pt<25%) FIM - Locomotion: Ambulation Locomotion: Ambulation Assistive Devices: Designer, industrial/product Ambulation/Gait Assistance: 5: Supervision Locomotion: Ambulation: 5: Travels 150 ft or more with supervision/safety issues  Comprehension Comprehension Mode: Auditory Comprehension: 4-Understands basic 75 - 89% of the time/requires cueing 10 - 24% of the time  Expression Expression Mode: Verbal Expression: 3-Expresses basic 50 - 74% of the time/requires cueing 25 - 50% of the time. Needs to repeat parts of sentences.  Social Interaction Social Interaction: 3-Interacts appropriately  50 - 74% of the time - May be physically or verbally inappropriate.  Problem Solving Problem Solving: 2-Solves basic 25 - 49% of the time - needs direction more than half the time to initiate, plan or complete simple activities  Memory Memory: 2-Recognizes or recalls 25 - 49% of the time/requires cueing 51 - 75% of the time  Medical Problem List and Plan:  ICH with seizure activity--both CT's done last night showed improvement  -Recheck CT stable to improved  1. DVT Prophylaxis/Anticoagulation: Pharmaceutical: Lovenox  2. Pain Management: CT of lumbar spine shows multilevel spondylosis----mild stenosis. No fractures  -scheduled lidoderm patches  - D/C'ed oxycodone for pain, tylenol and tramadol  -k pad   3. H/o depression/Mood: fluctuates but overall improved  -keppra decreased back to 500mg  to help reduce any neurosedation   - zoloft (which she took at home)  -waist belt needed at times for safety, monitor and d/c if no attempts at OOB without sup--continue for now     4. Neuropsych: This patient is not capable of making decisions on her own behalf at this moment  5. A fib with PPM: Continue amiodarone and metoprolol. No anticoagulation at this time and f/u with cards in [place].    7. New onset seizure: on keppra bid--level therapeutic, Appreciate Neuro consult 8. Aspiration PNA:abx dc'ed   LOS (Days) 14 A FACE TO FACE EVALUATION WAS PERFORMED  Ann Kaufman T 06/07/2013 8:34 AM

## 2013-06-07 NOTE — Progress Notes (Signed)
Recreational Therapy Session Note  Patient Details  Name: Ann Kaufman MRN: [id number] Date of Birth: 02-14-1930 Today's Date: 06/07/2013  Pain: no c/o Skilled Therapeutic Interventions/Progress Updates: Pt participated in kitchen activities in preparation for pt party tomorrow with supervision for balance & ambulation & min- max cues for attention & word finding.  Pt more animated today demonstrated by increased interaction with other patients, visitors & staff.  Ammiel Guiney 06/07/2013, 2:31 PM

## 2013-06-08 ENCOUNTER — Inpatient Hospital Stay (HOSPITAL_COMMUNITY): Payer: Medicare Other | Admitting: Physical Therapy

## 2013-06-08 ENCOUNTER — Inpatient Hospital Stay (HOSPITAL_COMMUNITY): Payer: Medicare Other

## 2013-06-08 ENCOUNTER — Inpatient Hospital Stay (HOSPITAL_COMMUNITY): Payer: Medicare Other | Admitting: Speech Pathology

## 2013-06-08 DIAGNOSIS — J159 Unspecified bacterial pneumonia: Secondary | ICD-10-CM

## 2013-06-08 DIAGNOSIS — I4891 Unspecified atrial fibrillation: Secondary | ICD-10-CM

## 2013-06-08 DIAGNOSIS — I619 Nontraumatic intracerebral hemorrhage, unspecified: Secondary | ICD-10-CM

## 2013-06-08 DIAGNOSIS — I10 Essential (primary) hypertension: Secondary | ICD-10-CM

## 2013-06-08 LAB — URINE CULTURE
Colony Count: NO GROWTH
Culture: NO GROWTH

## 2013-06-08 MED ORDER — FAMOTIDINE 20 MG PO TABS: 20.0000 mg | ORAL_TABLET | Freq: Two times a day (BID) | ORAL | Status: AC

## 2013-06-08 MED ORDER — TRAMADOL HCL 50 MG PO TABS: 50.0000 mg | ORAL_TABLET | Freq: Four times a day (QID) | ORAL | Status: DC | PRN

## 2013-06-08 MED ORDER — SERTRALINE HCL 50 MG PO TABS: 50.0000 mg | ORAL_TABLET | Freq: Every day | ORAL | Status: DC

## 2013-06-08 MED ORDER — SIMVASTATIN 10 MG PO TABS: 10.0000 mg | ORAL_TABLET | Freq: Every evening | ORAL | Status: DC

## 2013-06-08 MED ORDER — VITAMIN D (ERGOCALCIFEROL) 1.25 MG (50000 UNIT) PO CAPS: 50000.0000 [IU] | ORAL_CAPSULE | ORAL | Status: DC

## 2013-06-08 MED ORDER — TUBERCULIN PPD 5 UNIT/0.1ML ID SOLN
5.0000 [IU] | Freq: Once | INTRADERMAL | Status: DC
  Administered 2013-06-08: 5 [IU] via INTRADERMAL
  Filled 2013-06-08: qty 0.1

## 2013-06-08 MED ORDER — ALPRAZOLAM 0.25 MG PO TABS: 0.2500 mg | ORAL_TABLET | Freq: Two times a day (BID) | ORAL | Status: DC | PRN

## 2013-06-08 MED ORDER — OMEPRAZOLE 20 MG PO CPDR: 20.0000 mg | DELAYED_RELEASE_CAPSULE | Freq: Every morning | ORAL | Status: DC

## 2013-06-08 MED ORDER — VITAMIN B-12 1000 MCG PO TABS: 1000.0000 ug | ORAL_TABLET | Freq: Every evening | ORAL | Status: DC

## 2013-06-08 MED ORDER — METHOCARBAMOL 500 MG PO TABS: 500.0000 mg | ORAL_TABLET | Freq: Four times a day (QID) | ORAL | Status: DC | PRN

## 2013-06-08 MED ORDER — LEVETIRACETAM 500 MG PO TABS: 500.0000 mg | ORAL_TABLET | Freq: Two times a day (BID) | ORAL | Status: DC

## 2013-06-08 MED ORDER — ASPIRIN 81 MG PO TBEC: 81.0000 mg | DELAYED_RELEASE_TABLET | Freq: Every day | ORAL | Status: DC

## 2013-06-08 MED ORDER — FAMOTIDINE 20 MG PO TABS: 20.0000 mg | ORAL_TABLET | Freq: Two times a day (BID) | ORAL | Status: DC

## 2013-06-08 MED ORDER — METOPROLOL TARTRATE 25 MG PO TABS: 25.0000 mg | ORAL_TABLET | Freq: Two times a day (BID) | ORAL | Status: DC

## 2013-06-08 MED ORDER — LIDOCAINE 5 % EX PTCH: 2.0000 | MEDICATED_PATCH | CUTANEOUS | Status: DC

## 2013-06-08 MED ORDER — SENNA 8.6 MG PO TABS: 2.0000 | ORAL_TABLET | Freq: Every day | ORAL | Status: AC

## 2013-06-08 MED ORDER — AMIODARONE HCL 200 MG PO TABS: 200.0000 mg | ORAL_TABLET | Freq: Every morning | ORAL | Status: DC

## 2013-06-08 NOTE — Progress Notes (Signed)
Occupational Therapy Session Note  Patient Details  Name: Ann Kaufman MRN: [id number] Date of Birth: 1930-03-24  Today's Date: 06/08/2013  Session 1 Time: 0900-1000 Time Calculation (min): 60 min  Short Term Goals: Week 2:   STG=LTG secondary to LOS  Skilled Therapeutic Interventions/Progress Updates:    Pt engaged in bathing at shower level and dressing with sit<>stand from EOB.  Pt amb with RW to gather clothing and into bathroom to use toilet prior to shower.  Pt remained standing for shower this morning.  Pt completed all bathing and dressing tasks at supervision level with min verbal cues for safety awareness with RW.  Pt often will amb in room without RW and states that she forgot when reminded.  Pt conversation is tangental and requires min verbal cues for redirection to subject. Focus on task initiation, sequeincing, attention to task, safety awareness, and activity tolerance.  Therapy Documentation Precautions:  Precautions Precautions: Fall Restrictions Weight Bearing Restrictions: No Pain: Pain Assessment Pain Assessment: No/denies pain  See FIM for current functional status  Therapy/Group: Individual Therapy  Session 2 Time: 1130-1200 Pt denies pain Individual Therapy  Pt amb with RW to ADL kitchen to continue to prepare for patient Holiday party this afternoon.  Pt gather food items from refrigerator to wash before placing them in plastic bags and returning them to refrigerator.  Pt gatherer all supplies and performed all cleaning tasks appropriately.  Pt continued to require men verbal cues for RW safety.  Pt returned to room with daughter present.   Lavone Neri [facility] 06/08/2013, 10:05 AM

## 2013-06-08 NOTE — Progress Notes (Signed)
Physical Therapy Note  Patient Details  Name: CASSADIE PANKONIN MRN: [id number] Date of Birth: [birth year] Today's Date: 06/08/2013  Time: 1000-1055 55 minutes  1:1 No c/o pain.  Pt performed w/c mobility with min A in home and controlled environment, assist needed for steering.  Gait in controlled and home environments with obstacle negotiation and cognitive challenges with supervision, pt able to self correct LOB.  Gait without AD with bouncing/tossing ball with min A for occasional LOB, cues for safety.  Berg balance test performed, pt scored 43/56, still a fall risk and recommend using RW for safety.  Stair negotiation with B handrails x 15 stairs with supervision.  [place] HEP performed for balance and LE strengthening with 2# weights B LEs.  Pt reports she is ready to move on but would prefer to go home.   Commodore Bellew 06/08/2013, 10:56 AM

## 2013-06-08 NOTE — Progress Notes (Signed)
Speech Language Pathology Daily Session Note  Patient Details  Name: Ann Kaufman MRN: [id number] Date of Birth: 1930/01/06  Today's Date: 06/08/2013 Time: 0830-0900 Time Calculation (min): 30 min  Short Term Goals: Week 2: SLP Short Term Goal 1 (Week 2): Pt will demonstrate topic maintenance for ~3 turns with Max A verbal cues for redirection  SLP Short Term Goal 2 (Week 2): Pt will demonstrate sustained attention to a functional task for 5 minutes with Max A cues for redirection  SLP Short Term Goal 3 (Week 2): Pt will orient to place, time and situation with Max A multimodal cues  SLP Short Term Goal 4 (Week 2): Pt will self-monitor and correct verbal errors with Max A multimodal cueing at the phrase level   Skilled Therapeutic Interventions: Treatment focus on cognitive-linguistic goals. SLP facilitated session by providing Min-Mod verbal cues for emergent awareness into language impairments.  Pt demonstrated orientation to location and time with Max cues and utilized her schedule with Max cues for redirection. Pt maintained topic of conversation up to 3 turns with Max cues. Continue plan of care.    FIM:  Comprehension Comprehension Mode: Auditory Comprehension: 4-Understands basic 75 - 89% of the time/requires cueing 10 - 24% of the time Expression Expression Mode: Verbal Expression: 3-Expresses basic 50 - 74% of the time/requires cueing 25 - 50% of the time. Needs to repeat parts of sentences. Social Interaction Social Interaction: 2-Interacts appropriately 25 - 49% of time - Needs frequent redirection. Problem Solving Problem Solving: 2-Solves basic 25 - 49% of the time - needs direction more than half the time to initiate, plan or complete simple activities Memory Memory: 2-Recognizes or recalls 25 - 49% of the time/requires cueing 51 - 75% of the time  Pain Pain Assessment Pain Assessment: No/denies pain  Therapy/Group: Individual Therapy   [name], M.A.  CCC-SLP [phone]   [name] 06/08/2013, 9:04 AM

## 2013-06-08 NOTE — Progress Notes (Signed)
Physical Therapy Discharge Summary  Patient Details  Name: Ann Kaufman MRN: [id number] Date of Birth: [birth year]  Today's Date: 06/09/2013  Patient has met 7 of 7 long term goals due to improved activity tolerance, improved balance, increased strength, decreased pain, improved attention and improved awareness. Pt continues to exhibit sporadic periods of confusion, req increased verbal cues to attend to functional tasks. Pt's conversations are frequently tangential and non sensical w/ no awareness. Patient to discharge at an ambulatory level supervision w/ use of RW, however, daughter unable to provide recommended 24hr physical and feels that her mother would be best at an ALF instead of a SNF.  Reasons goals not met: N/A   Recommendation:  Patient will benefit from ongoing skilled PT services in Assisted Living setting to continue to advance safe functional mobility, address ongoing impairments in decreased functional endurance, decreased balance, decreased attention, decreased awareness, and minimize fall risk.   Equipment: No equipment provided  Reasons for discharge: treatment goals met and discharge from hospital  Patient/family agrees with progress made and goals achieved: Yes  PT Discharge Pain Pain Assessment Pain Assessment: No/denies pain  Cognition Overall Cognitive Status: Impaired/Different from baseline Arousal/Alertness: Awake/alert Orientation Level: Oriented to person;Disoriented to place;Disoriented to time;Disoriented to situation Attention: Sustained;Selective Focused Attention: Appears intact Sustained Attention: Appears intact Selective Attention: Impaired Selective Attention Impairment: Functional complex;Verbal complex  Memory: Impaired Memory Impairment: Decreased recall of new information Awareness: Impaired Awareness Impairment: Emergent impairment;Anticipatory impairment Problem Solving: Impaired Problem Solving Impairment: Verbal basic Executive  Function: Self Monitoring;Self Correcting;Organizing Reasoning: Impaired Sequencing: Appears intact Sequencing Impairment: Functional complex Organizing: Impaired Organizing Impairment: Verbal basic;Functional basic Self Monitoring: Impaired Self Monitoring Impairment: Verbal basic;Functional basic Self Correcting: Impaired Self Correcting Impairment: Verbal basic;Functional basic Behaviors: Restless Safety/Judgment: Impaired Sensation Sensation Light Touch: Appears Intact Proprioception: Appears Intact Coordination Gross Motor Movements are Fluid and Coordinated: Yes Motor  Motor Motor: Within Functional Limits   Mobility/Supervision Pt req overall supervision for bed mobility, bed<>chair and furniture transfers, stair negotiation as well as ambulation >150' w/ use of RW. Pt req min A during ambulation w/out use of RW as well as during periods of confusion.   Trunk/Postural Assessment  Cervical Assessment Cervical Assessment: Within Functional Limits Thoracic Assessment Thoracic Assessment: Within Functional Limits Postural Control Postural Control: Within Functional Limits  Balance Berg Balance Test Sit to Stand: Able to stand without using hands and stabilize independently Standing Unsupported: Able to stand safely 2 minutes Sitting with Back Unsupported but Feet Supported on Floor or Stool: Able to sit safely and securely 2 minutes Stand to Sit: Sits safely with minimal use of hands Transfers: Able to transfer safely, minor use of hands Standing Unsupported with Eyes Closed: Able to stand 10 seconds with supervision Standing Ubsupported with Feet Together: Able to place feet together independently and stand for 1 minute with supervision From Standing, Reach Forward with Outstretched Arm: Can reach forward >12 cm safely (5") From Standing Position, Pick up Object from Floor: Able to pick up shoe, needs supervision From Standing Position, Turn to Look Behind Over each  Shoulder: Turn sideways only but maintains balance Turn 360 Degrees: Able to turn 360 degrees safely but slowly Standing Unsupported, Alternately Place Feet on Step/Stool: Able to stand independently and complete 8 steps >20 seconds Standing Unsupported, One Foot in Front: Able to plae foot ahead of the other independently and hold 30 seconds Standing on One Leg: Tries to lift leg/unable to hold 3 seconds but remains standing independently Total Score:  43 Extremity Assessment      RLE Assessment RLE Assessment: Within Functional Limits LLE Assessment LLE Assessment: Within Functional Limits  See FIM for current functional status  Denzil Hughes 06/09/2013, 7:57 AM

## 2013-06-08 NOTE — Progress Notes (Signed)
SLP Cancellation Note  Patient Details Name: Ann Kaufman MRN: [id number] DOB: 1929/12/20   Cancelled treatment:       Pt missed 30 minutes of skilled SLP intervention due to meeting with assisted living facility.    Telina Kleckley 06/08/2013, 3:45 PM

## 2013-06-08 NOTE — Progress Notes (Signed)
Speech Language Pathology Discharge Summary  Patient Details  Name: Ann Kaufman MRN: [id number] Date of Birth: [birth year]  Today's Date: 06/08/2013  Patient has met 3 of 4 long term goals.  Patient to discharge at overall Mod level.   Reasons goals not met: Pt continues to require Max multimodal cueing for utilization of external memory aids to recall new, daily information    Clinical Impression/Discharge Summary: Patient has met 3 of 4 long term goals this admission and requires overall Mod A for functional problem solving, working memory, attention, emergent awareness and safety awareness. Pt's progress has been inconsistent during this admission and pt continues to exhibit sporadic periods of confusion and overall language of confusion. Pt also demonstrates impaired word-finding and verbal expression of wants/needs and requires Mod A to self-monitor and correct verbal errors. Pt can also be tangential at times. Family has been educated that pt should have 24 hour supervision at discharge but patient is discharging to an Research officer, trade union. Pt will require f/u SLP services at next venue of care to maximize cognitive-linguistic function.     Recommendation:  Skilled Nursing facility  Rationale for SLP Follow Up: Maximize cognitive function and independence;Reduce caregiver burden;Maximize functional communication   Equipment: N/A   Reasons for discharge: Discharged from hospital   Patient/Family Agrees with Progress Made and Goals Achieved: Yes   See FIM for current functional status  Adamaris King 06/08/2013, 3:57 PM

## 2013-06-08 NOTE — Progress Notes (Signed)
Social Work Patient ID: Ann Kaufman, female   DOB: May 23, 1930, 79 y.o.   MRN: [id number]  Multiple conversations yesterday and today regarding d/c plans.  Have explained to daughter that pt is medically ready to transition to SNF.  Had received SNF bed offer yesterday from [facility].  Daughter went to visit this facility as well as a local ALF ([facility]).  Have discussed that pt's physical and cognitive gains have been good/ stable past couple of days and feel she could really be managed at either level.  Pt and daughter have made final decision to first d/c to SNF at [facility] with [facility] to be a longer term living option/ plan.  Alerting tx team to plan for d/c this afternoon to SNF.  Emi Lymon, LCSW

## 2013-06-08 NOTE — Progress Notes (Signed)
Subjective/Complaints: No problems over night. Fairly uneventful     A 12 point review of systems has been performed and if not noted above is otherwise negative.pt does have cognitive deficits that limit ROS   Objective: Vital Signs: Blood pressure 98/66, pulse 68, temperature 97 F (36.1 C), temperature source Oral, resp. rate 18, weight 70.489 kg (155 lb 6.4 oz), SpO2 98.00%. Ct Head Wo Contrast  06/06/2013   CLINICAL DATA:  Decreased cognition. History of left intracranial hemorrhage. Fall.  EXAM: CT HEAD WITHOUT CONTRAST  TECHNIQUE: Contiguous axial images were obtained from the base of the skull through the vertex without intravenous contrast.  COMPARISON:  05/27/2013 and 05/18/2013.  FINDINGS: The left frontal lobe hematoma has decreased in density and size now measuring 2.4 x 2.1 cm versus prior 3.3 x 2.5 cm. Surrounding vasogenic edema. Minimal impression upon the frontal horn of the left lateral ventricle and a very minimal bowing of the septum towards the right.  No new intracranial hemorrhage detected.  No acute thrombotic infarct.  Small vessel disease type changes.  No intracranial mass lesion seen separate from above described findings.  No skull fracture.  Orbital structures appear intact.  Vascular calcifications.  IMPRESSION: Decrease in size of a left frontal lobe hematoma as noted above. Surrounding vasogenic edema and mild local mass effect without significant change.  No new intracranial hemorrhage.   Electronically Signed   By: Bridgett Larsson M.D.   On: 06/06/2013 09:11    Recent Labs  06/05/13 1720  WBC 7.3  HGB 12.2  HCT 37.5  PLT 252    Recent Labs  06/05/13 1720  NA 137  K 4.1  CL 100  GLUCOSE 102*  BUN 15  CREATININE 0.95  CALCIUM 8.9   CBG (last 3)  No results found for this basename: GLUCAP,  in the last 72 hours  Wt Readings from Last 3 Encounters:  06/08/13 70.489 kg (155 lb 6.4 oz)  05/24/13 78.291 kg (172 lb 9.6 oz)  04/06/13 74.481 kg (164  lb 3.2 oz)    Physical Exam:  Constitutional: She is oriented to person, place, and time. She appears well-developed and well-nourished.  HENT:  Head: Normocephalic and atraumatic.  Eyes: Conjunctivae and EOM are normal. Pupils are equal, round, and reactive to light. Right eye exhibits no discharge. Left eye exhibits no discharge. No scleral icterus.  Neck: Normal range of motion. Neck supple.  Cardiovascular: Normal rate and regular rhythm. Exam reveals no gallop and no friction rub.  No murmur heard.  Respiratory: Effort normal. No respiratory distress. She has no wheezes.  GI: Soft. Bowel sounds are normal. She exhibits no distension. There is no tenderness.  Musculoskeletal: She exhibits no edema. Minimal low back pain today at rest  Neurological: She is drowsy, distracted, confused. Limited insight and awareness Right facial weakness. Speech with dysarthria. Language is aphasic, sometimes LOC.  Extremely alert. Follows simple commands.  Skin: Skin is warm and dry.  A few limb bruises noted  Psychiatric: confused, calm/non-agitated   Assessment/Plan: 1. Functional deficits secondary to left frontal hematoma which require 3+ hours per day of interdisciplinary therapy in a comprehensive inpatient rehab setting. Physiatrist is providing close team supervision and 24 hour management of active medical problems listed below. Physiatrist and rehab team continue to assess barriers to discharge/monitor patient progress toward functional and medical goals.  Hopeful for placement today.  FIM: FIM - Bathing Bathing Steps Patient Completed: Chest;Right Arm;Left Arm;Abdomen;Front perineal area;Right upper leg;Left upper leg;Right lower  leg (including foot);Left lower leg (including foot);Buttocks Bathing: 5: Supervision: Safety issues/verbal cues  FIM - Upper Body Dressing/Undressing Upper body dressing/undressing steps patient completed: Thread/unthread right bra strap;Thread/unthread left  bra strap;Thread/unthread right sleeve of pullover shirt/dresss;Thread/unthread left sleeve of pullover shirt/dress;Put head through opening of pull over shirt/dress;Pull shirt over trunk Upper body dressing/undressing: 5: Supervision: Safety issues/verbal cues FIM - Lower Body Dressing/Undressing Lower body dressing/undressing steps patient completed: Thread/unthread right underwear leg;Thread/unthread left underwear leg;Pull underwear up/down;Thread/unthread right pants leg;Don/Doff right sock;Fasten/unfasten pants;Pull pants up/down;Thread/unthread left pants leg;Don/Doff left sock;Don/Doff right shoe;Don/Doff left shoe;Fasten/unfasten right shoe;Fasten/unfasten left shoe Lower body dressing/undressing: 5: Supervision: Safety issues/verbal cues  FIM - Toileting Toileting steps completed by patient: Adjust clothing prior to toileting;Performs perineal hygiene;Adjust clothing after toileting Toileting Assistive Devices: Grab bar or rail for support Toileting: 5: Supervision: Safety issues/verbal cues  FIM - Diplomatic Services operational officer Devices: Elevated toilet seat Toilet Transfers: 5-To toilet/BSC: Supervision (verbal cues/safety issues);5-From toilet/BSC: Supervision (verbal cues/safety issues)  FIM - Banker Devices: Walker;Arm rests Bed/Chair Transfer: 5: Bed > Chair or W/C: Supervision (verbal cues/safety issues);5: Chair or W/C > Bed: Supervision (verbal cues/safety issues);5: Supine > Sit: Supervision (verbal cues/safety issues);5: Sit > Supine: Supervision (verbal cues/safety issues)  FIM - Locomotion: Wheelchair Locomotion: Wheelchair: 0: Activity did not occur FIM - Locomotion: Ambulation Locomotion: Ambulation Assistive Devices: Designer, industrial/product Ambulation/Gait Assistance: 5: Supervision;Other (comment) (none) Locomotion: Ambulation: 4: Travels 150 ft or more with minimal assistance (Pt.>75%)  Comprehension Comprehension  Mode: Auditory Comprehension: 4-Understands basic 75 - 89% of the time/requires cueing 10 - 24% of the time  Expression Expression Mode: Verbal Expression: 3-Expresses basic 50 - 74% of the time/requires cueing 25 - 50% of the time. Needs to repeat parts of sentences.  Social Interaction Social Interaction: 3-Interacts appropriately 50 - 74% of the time - May be physically or verbally inappropriate.  Problem Solving Problem Solving: 2-Solves basic 25 - 49% of the time - needs direction more than half the time to initiate, plan or complete simple activities  Memory Memory: 2-Recognizes or recalls 25 - 49% of the time/requires cueing 51 - 75% of the time  Medical Problem List and Plan:  ICH with seizure activity--both CT's done last night showed improvement  -Recheck CT stable to improved  1. DVT Prophylaxis/Anticoagulation: Pharmaceutical: Lovenox  2. Pain Management: CT of lumbar spine shows multilevel spondylosis----mild stenosis. No fractures  -scheduled lidoderm patches  - D/C'ed oxycodone for pain, tylenol and tramadol  -k pad   3. H/o depression/Mood: fluctuates but overall improved--limiting neurosedating meds  -keppra decreased back to 500mg  to help reduce any neurosedation   - zoloft (which she took at home)  -waist belt needed at times for safety, monitor and d/c if no attempts at OOB without sup--continue for now     4. Neuropsych: This patient is not capable of making decisions on her own behalf at this moment  5. A fib with PPM: Continue amiodarone and metoprolol. No anticoagulation at this time and f/u with cards in [place].    7. New onset seizure: on keppra bid--level therapeutic, Appreciate Neuro consult 8. Aspiration PNA:abx dc'ed   LOS (Days) 15 A FACE TO FACE EVALUATION WAS PERFORMED  SWARTZ,ZACHARY T 06/08/2013 8:33 AM

## 2013-06-08 NOTE — Progress Notes (Signed)
Social Work Patient ID: Ann Kaufman, female   DOB: April 16, 1930, 79 y.o.   MRN: [id number]  Per pt's daughter, they have now decided to pursue ALF bed at [facility].  Cannot admit pt until tomorrow, therefore, today's d/c cancelled with plan to d/c tomorrow morning.  Tx team aware.  Pollyann Roa, LCSW

## 2013-06-08 NOTE — Discharge Summary (Addendum)
Physician Discharge Summary  Patient ID: ALEXSA FLAUM MRN: [id number] DOB/AGE: December 22, 1929 79 y.o.  Admit date: 05/24/2013 Discharge date: 06/09/2013  Discharge Diagnoses:  Principal Problem:   ICH (intracerebral hemorrhage) Active Problems:   HTN (hypertension)   PAF (paroxysmal atrial fibrillation)   Syncope   Pacemaker   Delirium of mixed origin   Discharged Condition:  Stable  Significant Diagnostic Studies: Dg Chest 2 View  05/26/2013   CLINICAL DATA:  Followup infiltrates  EXAM: CHEST  2 VIEW  COMPARISON:  05/25/2013  FINDINGS: Improving bilateral airspace disease consistent with resolving edema or pneumonia. Small bilateral pleural effusions are present.  Underlying COPD.  Dual lead pacemaker unchanged.  IMPRESSION: COPD with improving diffuse bilateral airspace disease most consistent with resolving edema.   Electronically Signed   By: Marlan Palau M.D.   On: 05/26/2013 08:14   Dg Chest 2 View  05/25/2013   CLINICAL DATA:  Evaluate for pneumonia  EXAM: CHEST  2 VIEW  COMPARISON:  DG CHEST 1V PORT dated 05/20/2013; CT ANGIO CHEST W/CM &/OR WO/CM dated 11/26/2011; DG CHEST 1V PORT dated 05/19/2013; DG CHEST 1V PORT dated 05/18/2013  FINDINGS: Left-sided pacemaker overlies normal cardiac silhouette. There is increased opacity in the right upper lobe compared to most recent prior. There is chronic peribronchial markings similar prior. The small left effusion is noted.  IMPRESSION: 1. Increased opacity in the right upper lobe is concerning for pneumonia or asymmetric edema. 2. Small left effusion is similar. 3. Perihilar chronic bronchitic change.   Electronically Signed   By: Genevive Bi M.D.   On: 05/25/2013 09:45   Dg Lumbar Spine 2-3 Views  05/25/2013   CLINICAL DATA:  Low back pain  EXAM: LUMBAR SPINE - 2-3 VIEW  COMPARISON:  None.  FINDINGS: Normal alignment of the lumbar vertebral bodies. There is bulky osteophytosis at T12-L1. No evidence of subluxation.  IMPRESSION: 1. No  acute findings lumbar spine. 2. Multilevel disc osteophytic disease most prominent from the T12 to L2. Marland Kitchen   Electronically Signed   By: Genevive Bi M.D.   On: 05/25/2013 09:51   Dg Ankle Complete Right  05/28/2013   CLINICAL DATA:  Pain, possible history of fall  EXAM: RIGHT ANKLE - COMPLETE 3+ VIEW  COMPARISON:  None.  FINDINGS: Three views of the right ankle reveal the joint mortise to be preserved. The talar dome is intact. There is no evidence of an acute malleolar fracture. There are mild degenerative changes of the tip of the medial malleolus. There is mild soft tissue swelling diffusely. The talus and calcaneus appear intact. There are plantar and Achilles region calcaneal spurs.  IMPRESSION: There is no evidence of an acute fracture nor dislocation of the right ankle. There is irregularity of the contour of the medial malleolus which does not appear to reflect an acute fracture but could reflect an old fracture or or other old injury. There is mild soft tissue swelling diffusely.   Electronically Signed   By: David  [place]   On: 05/28/2013 11:56   Ct Head Wo Contrast  06/06/2013   CLINICAL DATA:  Decreased cognition. History of left intracranial hemorrhage. Fall.  EXAM: CT HEAD WITHOUT CONTRAST  TECHNIQUE: Contiguous axial images were obtained from the base of the skull through the vertex without intravenous contrast.  COMPARISON:  05/27/2013 and 05/18/2013.  FINDINGS: The left frontal lobe hematoma has decreased in density and size now measuring 2.4 x 2.1 cm versus prior 3.3 x 2.5 cm. Surrounding vasogenic  edema. Minimal impression upon the frontal horn of the left lateral ventricle and a very minimal bowing of the septum towards the right.  No new intracranial hemorrhage detected.  No acute thrombotic infarct.  Small vessel disease type changes.  No intracranial mass lesion seen separate from above described findings.  No skull fracture.  Orbital structures appear intact.  Vascular  calcifications.  IMPRESSION: Decrease in size of a left frontal lobe hematoma as noted above. Surrounding vasogenic edema and mild local mass effect without significant change.  No new intracranial hemorrhage.   Electronically Signed   By: Bridgett Larsson M.D.   On: 06/06/2013 09:11   Ct Lumbar Spine Wo Contrast  05/30/2013   CLINICAL DATA:  Low back tenderness extending into the buttocks. Several falls. Recent stroke.  EXAM: CT LUMBAR SPINE WITHOUT CONTRAST  TECHNIQUE: Multidetector CT imaging of the lumbar spine was performed without intravenous contrast administration. Multiplanar CT image reconstructions were also generated.  COMPARISON:  Lumbar spine radiographs 05/25/2013. Chest CT 11/26/2011.  FINDINGS: There are 5 lumbar type vertebral bodies demonstrating a mild convex right scoliosis. The lateral alignment is normal. There is no evidence of fracture or pars defect.  No paraspinal abnormalities are identified. Two low-density left renal lesions (probable cysts) and a nonobstructing left renal calculus are noted. There is no hydronephrosis.  There anterior paraspinal osteophytes at T12-L1 and L1-2, but no spinal stenosis or nerve root encroachment.  L2-3: Mild disc bulging with mild facet and ligamentous hypertrophy. No significant spinal stenosis or nerve root encroachment.  L3-4: Disc bulging, facet and ligamentous hypertrophy contribute to mild central stenosis. The foramina appear sufficiently patent.  L4-5: Disc bulging, facet and ligamentous hypertrophy contribute to mild central stenosis. The foramina appear sufficiently patent.  L5-S1: Mild disc bulging with facet and ligamentous hypertrophy. No significant spinal stenosis or nerve root encroachment.  IMPRESSION: 1. No acute osseous findings. The alignment is normal aside from a mild scoliosis. 2. Mild disc bulging, facet and ligamentous hypertrophy as described. There is only mild central stenosis at L3-4 and L4-5. No foraminal compromise or nerve  root encroachment identified. 3. Probable left renal cysts and a nonobstructing left renal calculus are noted incidentally.   Electronically Signed   By: Roxy Horseman M.D.   On: 05/30/2013 10:48   Labs:  Basic Metabolic Panel:  Recent Labs Lab 06/03/13 0705 06/05/13 1720  NA 136 137  K 4.0 4.1  CL 100 100  CO2 25 27  GLUCOSE 92 102*  BUN 14 15  CREATININE 0.87 0.95  CALCIUM 8.9 8.9    CBC:  Recent Labs Lab 06/05/13 1720  WBC 7.3  HGB 12.2  HCT 37.5  MCV 85.6  PLT 252    Results for DEETTE, REVAK (MRN [id number]) as of 06/09/2013 08:20  Ref. Range 05/24/2013 00:04 05/24/2013 12:00  TSH Latest Range: 0.350-4.500 uIU/mL 6.144 (H)   Free T4 Latest Range: 0.80-1.80 ng/dL  4.09  T3, Free Latest Range: 2.3-4.2 pg/mL  2.5      Brief HPI:   CIDNEY KIRKWOOD is a 79 y.o. female with history of HTN, A fib-on xarelto, PPM; who was admitted on 05/18/13 with speech difficulty, right facial droop and seizure. Treated with ativan and loaded with keppra. Patient intubated for airway protection and CT head with Left frontal lobe intraparenchymal hematoma with intraventricular hemorrhage within the left lateral ventricle. Patient in acute respiratory distress due to pulmonary edema v/s ALI/ARDS. Empiric antibiotics and BD ordered. Neurology consulted and anticoagulation  reversed per FEIBA protocol. Follow up CCT 11/27 with left frontal hematoma and vasogenic edema. 2D echo with EF 55-60% and possible small, oscillating density (? Thrombus; ?vegetation)associated with RA wire vs chiari network. Extubated without difficulty and swallow evaluation without evidence of dysphagia. Therapies initiated and CIR recommended by team.    Hospital Course: JAELY SILMAN was admitted to rehab 05/24/2013 for inpatient therapies to consist of PT, ST and OT at least three hours five days a week. Past admission physiatrist, therapy team and rehab RN have worked together to provide customized collaborative inpatient  rehab.  Blood pressures have been controlled and no cardiac symptoms with increase in activity level. She has worsening of her chronic back pain and CT lumbar spine done showing multilevel spondylosis with mild stenosis. Lidocaine patches were added to help with symptoms. Pacemaker was interrogated due to syncope. This showed normal function and no episodes of A fib seen.  No further work up needed per cardiology  Patient developed agitation with confusion past admission. This was compounded by her aphasia as well as frustration regarding need for continued hospitalization. Her antidepressant was adjusted to help with mood stabilization. Labs showed vitamin B 12 level normal at 1306 and cortisol level-18.6. TSH levels elevated with normal T3 and T4. This is likely due to stress and she will need thyroid function studies rechecked in 2 weeks by primary MD.  Neurology was contacted for input and felt patient's fluctuating behavior with agitation was related to behavioral issues as well as her recent stroke. They recommended limiting neurosedating medications and felt that events did not sound like subclinical seizures.    Keppra was reduced to 500 mg bid and narcotics were discontinued.  Neuropsychology evaluation done with extensive family education and Dr. Wylene Simmer questioned underlying dementia.  Mood is currently stable with sporadic bouts of confusion.  Repeat CT head 06/06/13 shows decrease in hematoma and neurology recommended starting low dose ASA for stroke prevention.  She continues to be limited by cognitive linguistic deficits with poor awareness and poor insight.  24 hours supervision is recommended past discharge due to safety concerns.     Rehab course: During patient's stay in rehab weekly team conferences were held to monitor patient's progress, set goals and discuss barriers to discharge. She has had improvement in activity tolerance, balance, and coordination. She is able to ambulate in  controlled environment with supervision and use of RW. She is able to complete ADL tasks with supervision and min/moderate to attend to tasks. Her verbal output is frequently tangential and nonsensical with lack of awareness of her deficits.  She requires min to moderate assist for emergent awareness as well as utilization of compensatory strategies for impaired word finding and overall confusion.    Disposition: Assisted Living facility  Diet: Heart Healthy.      Future Appointments Provider Department Dept Phone   07/15/2013 12:00 PM Ranelle Oyster, MD [facility] Physical Medicine and Rehabilitation [phone]       Medication List    STOP taking these medications       furosemide 40 MG tablet  Commonly known as:  LASIX     potassium chloride SA 20 MEQ tablet  Commonly known as:  K-DUR,KLOR-CON      TAKE these medications       ALPRAZolam 0.25 MG tablet Rx # 10  Commonly known as:  XANAX  Take 1 tablet (0.25 mg total) by mouth 2 (two) times daily as needed for anxiety.  amiodarone 200 MG tablet  Commonly known as:  PACERONE  Take 200 mg by mouth every morning.     aspirin 81 MG EC tablet  Take 1 tablet (81 mg total) by mouth daily.     famotidine 20 MG tablet  Commonly known as:  PEPCID  Take 1 tablet (20 mg total) by mouth 2 (two) times daily.     folic acid 400 MCG tablet  Commonly known as:  FOLVITE  Take 400 mcg by mouth every evening.     JUICE PLUS FIBRE PO  Take 1 tablet by mouth daily.     levETIRAcetam 500 MG tablet  Commonly known as:  KEPPRA  Take 1 tablet (500 mg total) by mouth 2 (two) times daily.     lidocaine 5 %  Commonly known as:  LIDODERM  Place 2 patches onto the skin daily. Remove & Discard patch within 12 hours or as directed by MD     methocarbamol 500 MG tablet  Commonly known as:  ROBAXIN  Take 1 tablet (500 mg total) by mouth every 6 (six) hours as needed for muscle spasms.     metoprolol tartrate 25 MG tablet  Commonly  known as:  LOPRESSOR  Take 25 mg by mouth 2 (two) times daily.     omeprazole 20 MG capsule  Commonly known as:  PRILOSEC  Take 20 mg by mouth every morning.     senna 8.6 MG Tabs tablet  Commonly known as:  SENOKOT  Take 2 tablets (17.2 mg total) by mouth at bedtime.     sertraline 50 MG tablet  Commonly known as:  ZOLOFT  Take 50 mg by mouth at bedtime.     simvastatin 10 MG tablet  Commonly known as:  ZOCOR  Take 10 mg by mouth every evening.     traMADol 50 MG tablet Rx # 30  Commonly known as:  ULTRAM  Take 1 tablet (50 mg total) by mouth 4 (four) times daily as needed (Moderate pain).     TYLENOL PO  Take 1 tablet by mouth every 6 (six) hours as needed (for pain).     vitamin B-12 1000 MCG tablet  Commonly known as:  CYANOCOBALAMIN  Take 1,000 mcg by mouth every evening.     Vitamin D (Ergocalciferol) 50000 UNITS Caps capsule  Commonly known as:  DRISDOL  Take 50,000 Units by mouth every 14 (fourteen) days.       Follow-up Information   Follow up with Gates Rigg, MD. Call today. (for follow up appointment in 4 weeks. )    Specialties:  Neurology, Radiology   Contact information:   [address] [place] [address] [phone]       Follow up with Marcina Millard, MD. Call today. (for follow up in 2 weeks)    Specialty:  Internal Medicine   Contact information:   [facility] [address] [place] [address] [phone]       Follow up with Ranelle Oyster, MD On 07/15/2013. (Be there at 11:30 for noon appointment)    Specialty:  Physical Medicine and Rehabilitation   Contact information:   [address], [address] [place] [address] [phone]       Signed: Jacquelynn Cree 06/09/2013, 8:17 AM

## 2013-06-08 NOTE — Progress Notes (Signed)
Occupational Therapy Discharge Summary  Patient Details  Name: Ann Kaufman MRN: [id number] Date of Birth: Dec 19, 1929  Today's Date: 06/08/2013  Patient has met 11 of 11 long term goals due to improved activity tolerance, improved balance, improved awareness and improved coordination.  Pt's progress has been inconsistent during this admission but currently requires only supervision for safety during BADLs.  Pt continues to exhibit sporadic periods of confusion.  Pt requires min/mod verbal cues to attend to task.  Pt's conversation is frequently tangential and nonsensical with no awareness. 24 hour supervision has been recommended by therapy staff but patient is discharging to an Assisted Living Facility. Patient to discharge at overall Supervision level.  Patient's care partner, daughter has decided her mother would be best at an ALF instead of SNF level.    Recommendation:  Patient will benefit from ongoing skilled OT services in home health setting to continue to advance functional skills in the area of BADL and Reduce care partner burden.  Equipment: No equipment provided Discharge to Assisted Living Facility  Reasons for discharge: treatment goals met and discharge from hospital  Patient/family agrees with progress made and goals achieved: Yes  OT Discharge   ADL  See Fim Vision/Perception  Vision - History Baseline Vision: Wears glasses only for reading Patient Visual Report: No change from baseline Vision - Assessment Eye Alignment: Within Functional Limits Vision Assessment: Vision not tested Perception Perception: Within Functional Limits Praxis Praxis: Intact  Cognition Overall Cognitive Status: Impaired/Different from baseline Arousal/Alertness: Awake/alert Orientation Level: Oriented X4 Attention: Sustained;Selective Focused Attention: Appears intact Sustained Attention: Appears intact Selective Attention: Appears intact Memory: Impaired Memory Impairment:  Decreased recall of new information Awareness: Impaired Awareness Impairment: Emergent impairment;Anticipatory impairment Problem Solving: Impaired Safety/Judgment: Impaired Sensation Sensation Light Touch: Appears Intact Hot/Cold: Appears Intact Proprioception: Appears Intact Coordination Gross Motor Movements are Fluid and Coordinated: Yes Motor  Motor Motor: Within Functional Limits Mobility     Trunk/Postural Assessment  Cervical Assessment Cervical Assessment: Within Functional Limits Thoracic Assessment Thoracic Assessment: Within Functional Limits Lumbar Assessment Lumbar Assessment: Within Functional Limits Postural Control Postural Control: Within Functional Limits  Balance Berg Balance Test Sit to Stand: Able to stand without using hands and stabilize independently Standing Unsupported: Able to stand safely 2 minutes Sitting with Back Unsupported but Feet Supported on Floor or Stool: Able to sit safely and securely 2 minutes Stand to Sit: Sits safely with minimal use of hands Transfers: Able to transfer safely, minor use of hands Standing Unsupported with Eyes Closed: Able to stand 10 seconds with supervision Standing Ubsupported with Feet Together: Able to place feet together independently and stand for 1 minute with supervision From Standing, Reach Forward with Outstretched Arm: Can reach forward >12 cm safely (5") From Standing Position, Pick up Object from Floor: Able to pick up shoe, needs supervision From Standing Position, Turn to Look Behind Over each Shoulder: Turn sideways only but maintains balance Turn 360 Degrees: Able to turn 360 degrees safely but slowly Standing Unsupported, Alternately Place Feet on Step/Stool: Able to stand independently and complete 8 steps >20 seconds Standing Unsupported, One Foot in Front: Able to plae foot ahead of the other independently and hold 30 seconds Standing on One Leg: Tries to lift leg/unable to hold 3 seconds but  remains standing independently Total Score: 43 Static Sitting Balance Static Sitting - Balance Support: Feet supported Static Sitting - Level of Assistance: 6: Modified independent (Device/Increase time) Dynamic Sitting Balance Dynamic Sitting - Balance Support: Feet supported Dynamic  Sitting - Level of Assistance: 6: Modified independent (Device/Increase time) Extremity/Trunk Assessment RUE Assessment RUE Assessment: Within Functional Limits LUE Assessment LUE Assessment: Within Functional Limits  See FIM for current functional status  Rich Brave 06/08/2013, 12:20 PM

## 2013-06-08 NOTE — Patient Care Conference (Signed)
Inpatient RehabilitationTeam Conference and Plan of Care Update Date: 06/07/2013   Time: 3:05 PM    Patient Name: Ann Kaufman      Medical Record Number: [id number]  Date of Birth: [birth year] Sex: Female         Room/Bed: 4W19C/4W19C-01 Payor Info: Payor: MEDICARE / Plan: MEDICARE PART A AND B / Product Type: *No Product type* /    Admitting Diagnosis: L FRONTAL ICH  Admit Date/Time:  05/24/2013  4:52 PM Admission Comments: No comment available   Primary Diagnosis:  ICH (intracerebral hemorrhage) Principal Problem: ICH (intracerebral hemorrhage)  Patient Active Problem List   Diagnosis Date Noted  . Altered mental status 06/03/2013  . PAF (paroxysmal atrial fibrillation) 05/30/2013  . Syncope 05/30/2013  . Pacemaker 05/30/2013  . Acute respiratory failure 05/19/2013  . Chronic anticoagulation 05/19/2013  . Aspiration pneumonia 05/19/2013  . ICH (intracerebral hemorrhage) 05/18/2013  . Dyspnea on exertion 04/06/2013  . Atrial fibrillation, chronic 04/06/2013  . Atherosclerosis of aorta 04/06/2013  . Hypokalemia 11/28/2011  . Heart palpitations 11/27/2011  . Pleural effusion 11/27/2011  . Atrial fibrillation with RVR 11/27/2011  . HTN (hypertension) 11/27/2011    Expected Discharge Date: Expected Discharge Date: 06/08/13  Team Members Present: Physician leading conference: Dr. Faith Rogue Social Worker Present: Amada Jupiter, LCSW Nurse Present: Other (comment) Phylliss Bob, RN) PT Present: Bridgett Ripa, Scot Jun, PT OT Present: Bretta Bang, OT;Jennifer Katrinka Blazing, OT;Ardis Rowan, COTA SLP Present: Feliberto Gottron, SLP PPS Coordinator present : Tora Duck, RN, CRRN     Current Status/Progress Goal Weekly Team Focus  Medical   still with fluctuating behavior, more alert and participatie as a whole  normalized sleep wake and improved behavior  see prior, family ed, finalize medical planning and mgt for dc   Bowel/Bladder   Cont of bowel and bladder  regular BM q  1-2 days  encourage fluid intake   Swallow/Nutrition/ Hydration             ADL's   supervision/min A transfers; continued confusion; often requires max encouragement to participate  supervision overall  participation, safety awareness, orientation, activity tolerance, standing balance   Mobility   Supervision-min A for mobility; mod-max A for cognition and redirection during therpeutic tasks  Supervision for functional mobility, Mod A for cognition  Functional endurance, safety during functional mobility, transfers   Communication   Mod-Max  Mod A  self-monitor and correct verbal errors   Safety/Cognition/ Behavioral Observations  Max A  Mod A  orientation, functional problem solving, awareness    Pain   Denied pain at this time  < 3  free of pain   Skin   scattered  bruising  free of new skin breakdown  check skin daily    Rehab Goals Patient on target to meet rehab goals: Yes *See Care Plan and progress notes for long and short-term goals.  Barriers to Discharge: behavioral issues, safety    Possible Resolutions to Barriers:  supervision at dc    Discharge Planning/Teaching Needs:  plan to d/c to SNF vs ALF - daughter to visit facilities today      Team Discussion:  Better today with cognition - supervision level.  SW reports SNF offer received and daughter to visit this SNF as well as local ALF.  Hope to d/c tomorrow to one of them.  Revisions to Treatment Plan:  None   Continued Need for Acute Rehabilitation Level of Care: The patient requires daily medical management by a physician with  specialized training in physical medicine and rehabilitation for the following conditions: Daily direction of a multidisciplinary physical rehabilitation program to ensure safe treatment while eliciting the highest outcome that is of practical value to the patient.: Yes Daily medical management of patient stability for increased activity during participation in an intensive  rehabilitation regime.: Yes Daily analysis of laboratory values and/or radiology reports with any subsequent need for medication adjustment of medical intervention for : Neurological problems;Other;Cardiac problems  Sayed Apostol 06/08/2013, 8:46 AM

## 2013-06-09 DIAGNOSIS — F05 Delirium due to known physiological condition: Secondary | ICD-10-CM | POA: Diagnosis present

## 2013-06-09 NOTE — Plan of Care (Signed)
Problem: RH SAFETY Goal: RH STG ADHERE TO SAFETY PRECAUTIONS W/ASSISTANCE/DEVICE STG Adhere to Safety Precautions With min Assistance/Device.  Outcome: Not Met (add Reason) Pt SRx4 with soft waistbelt in use. Pt agitated and physically abusive at times.

## 2013-06-09 NOTE — Progress Notes (Signed)
Social Work  Discharge Note  The overall goal for the admission was met for:   Discharge location: No - plan changed to placement - ALF  Length of Stay: No - altered original target date due to change in d/c plan  Discharge activity level: Yes - supervision  Home/community participation: Yes  Services provided included: MD, RD, PT, OT, SLP, RN, TR, Pharmacy, Neuropsych and SW  Financial Services: Medicare and Private Insurance: T[employer]are  Follow-up services arranged: Home Health: provided at ALF and Other: ALF @ [facility]  Comments (or additional information):  Patient/Family verbalized understanding of follow-up arrangements: Yes  Individual responsible for coordination of the follow-up plan: daughter  Confirmed correct DME delivered: NA    Daneka Lantigua

## 2013-06-09 NOTE — Progress Notes (Signed)
Pt discharged at 1315 to [facility] via daughter.  Belongings with pt and family.

## 2013-06-09 NOTE — Progress Notes (Signed)
Recreational Therapy Discharge Summary Patient Details  Name: Ann Kaufman MRN: [id number] Date of Birth: [birth year] Today's Date: 06/09/2013  Long term goals set: 1  Long term goals met: 1  Comments on progress toward goals: Pt has made good progress toward goal meeting supervision level for simple TR tasks.  Pt does require verbal cuing to complete tasks safely, cuing varies from min-max.  24 hour supervision recommended by team.  Pt is discharging today to ALF for 24 hour care. Reasons for discharge: discharge from hospital  Follow-up: Recommend participation in activities program  Patient/family agrees with progress made and goals achieved: Yes  Ann Kaufman 06/09/2013, 8:46 AM

## 2013-06-09 NOTE — Progress Notes (Signed)
Social Work Patient ID: Ann Kaufman, female   DOB: March 14, 1930, 79 y.o.   MRN: [id number]  Spoke with admissions coordinator at [facility] this morning to review events of last evening with pt.  They made some medication adjustment requests that MD was in agreement with and were agreed to continue with plan to admit pt today.  Daughter plans to stay with pt in room at ALF for the first few nights.  Haifa Hatton, LCSW

## 2013-06-09 NOTE — Progress Notes (Signed)
At approximately 2125 pm attempts made to give patient medication. Patient confused and refused to take medications, stating that "they are poison, and will make her die".  Spoke with patient's daughter [name] informed her that patient refused medication, daughter states she will come to visit mother around 2300 pm.  Nurse tech assisted patient into bathroom at approximately 2200 pm. Patient became very agitated and refused to get back in bed or sit in wheelchair. Patient walked to nurse station and was very confused, and refusing staff assistance. Efforts made to keep patient safe. House coverage nurse Victorino Dike notified of situation, and assisted with patient.  Patient's daughter [name] arrived at approximately 2330 pm, patient continued to refuse medication. With daughter Karen's consent house coverage nurse Victorino Dike administered Ativan 0.5mg  IM.  Patient allowed nurse to assist to bathroom, and cooperated as she was assisted to bed, soft waist belt applied, side rails x 4 up, and bed alarm set. Will continue to monitor patient.

## 2013-06-09 NOTE — Progress Notes (Signed)
Subjective/Complaints: Severe agitated episode last night which ultimately required im ativan. Daughter came in as well. Pt generally recalls but does not remember why she was so upset or the severity of the episode.  She is calm and pleasant this morning.      A 12 point review of systems has been performed and if not noted above is otherwise negative.pt does have cognitive deficits that limit ROS   Objective: Vital Signs: Blood pressure 146/84, pulse 87, temperature 98.3 F (36.8 C), temperature source Oral, resp. rate 18, weight 56.7 kg (125 lb), SpO2 99.00%. No results found. No results found for this basename: WBC, HGB, HCT, PLT,  in the last 72 hours No results found for this basename: NA, K, CL, CO, GLUCOSE, BUN, CREATININE, CALCIUM,  in the last 72 hours CBG (last 3)  No results found for this basename: GLUCAP,  in the last 72 hours  Wt Readings from Last 3 Encounters:  06/08/13 56.7 kg (125 lb)  05/24/13 78.291 kg (172 lb 9.6 oz)  04/06/13 74.481 kg (164 lb 3.2 oz)    Physical Exam:  Constitutional: She is oriented to person, place, and time. She appears well-developed and well-nourished.  HENT:  Head: Normocephalic and atraumatic.  Eyes: Conjunctivae and EOM are normal. Pupils are equal, round, and reactive to light. Right eye exhibits no discharge. Left eye exhibits no discharge. No scleral icterus.  Neck: Normal range of motion. Neck supple.  Cardiovascular: Normal rate and regular rhythm. Exam reveals no gallop and no friction rub.  No murmur heard.  Respiratory: Effort normal. No respiratory distress. She has no wheezes.  GI: Soft. Bowel sounds are normal. She exhibits no distension. There is no tenderness.  Musculoskeletal: She exhibits no edema. Minimal low back pain today at rest  Neurological: She is drowsy, distracted, confused. Limited insight and awareness Right facial weakness. Speech with dysarthria. Language is aphasic, sometimes LOC.  Extremely alert.  Follows simple commands.  Skin: Skin is warm and dry.  A few limb bruises noted  Psychiatric: confused, calm/non-agitated   Assessment/Plan: 1. Functional deficits secondary to left frontal hematoma which require 3+ hours per day of interdisciplinary therapy in a comprehensive inpatient rehab setting. Physiatrist is providing close team supervision and 24 hour management of active medical problems listed below. Physiatrist and rehab team continue to assess barriers to discharge/monitor patient progress toward functional and medical goals.  Will review dc plan with team/family. (the plan today was for ALF placement)  FIM: FIM - Bathing Bathing Steps Patient Completed: Chest;Right Arm;Left Arm;Abdomen;Front perineal area;Right upper leg;Left upper leg;Right lower leg (including foot);Left lower leg (including foot);Buttocks Bathing: 5: Supervision: Safety issues/verbal cues  FIM - Upper Body Dressing/Undressing Upper body dressing/undressing steps patient completed: Thread/unthread right bra strap;Thread/unthread left bra strap;Thread/unthread right sleeve of pullover shirt/dresss;Thread/unthread left sleeve of pullover shirt/dress;Put head through opening of pull over shirt/dress;Pull shirt over trunk Upper body dressing/undressing: 5: Supervision: Safety issues/verbal cues FIM - Lower Body Dressing/Undressing Lower body dressing/undressing steps patient completed: Thread/unthread right underwear leg;Thread/unthread left underwear leg;Pull underwear up/down;Thread/unthread right pants leg;Don/Doff right sock;Fasten/unfasten pants;Pull pants up/down;Thread/unthread left pants leg;Don/Doff left sock;Don/Doff right shoe;Don/Doff left shoe;Fasten/unfasten right shoe;Fasten/unfasten left shoe Lower body dressing/undressing: 5: Supervision: Safety issues/verbal cues  FIM - Toileting Toileting steps completed by patient: Adjust clothing prior to toileting;Performs perineal hygiene;Adjust clothing  after toileting Toileting Assistive Devices: Grab bar or rail for support Toileting: 5: Supervision: Safety issues/verbal cues  FIM - Diplomatic Services operational officer Devices: Grab bars Toilet Transfers:  5-To toilet/BSC: Supervision (verbal cues/safety issues);5-From toilet/BSC: Supervision (verbal cues/safety issues)  FIM - Banker Devices: Walker;Bed rails Bed/Chair Transfer: 5: Bed > Chair or W/C: Supervision (verbal cues/safety issues);5: Chair or W/C > Bed: Supervision (verbal cues/safety issues)  FIM - Locomotion: Wheelchair Locomotion: Wheelchair: 4: Travels 150 ft or more: maneuvers on rugs and over door sillls with minimal assistance (Pt.>75%) FIM - Locomotion: Ambulation Locomotion: Ambulation Assistive Devices: Designer, industrial/product Ambulation/Gait Assistance: 5: Supervision;Other (comment) (none) Locomotion: Ambulation: 5: Travels 150 ft or more with supervision/safety issues  Comprehension Comprehension Mode: Auditory Comprehension: 3-Understands basic 50 - 74% of the time/requires cueing 25 - 50%  of the time  Expression Expression Mode: Verbal Expression: 3-Expresses basic 50 - 74% of the time/requires cueing 25 - 50% of the time. Needs to repeat parts of sentences.  Social Interaction Social Interaction: 2-Interacts appropriately 25 - 49% of time - Needs frequent redirection.  Problem Solving Problem Solving: 2-Solves basic 25 - 49% of the time - needs direction more than half the time to initiate, plan or complete simple activities  Memory Memory: 2-Recognizes or recalls 25 - 49% of the time/requires cueing 51 - 75% of the time  Medical Problem List and Plan:  ICH with seizure activity--both CT's done last night showed improvement  -Recheck CT stable to improved  1. DVT Prophylaxis/Anticoagulation: Pharmaceutical: Lovenox  2. Pain Management: CT of lumbar spine shows multilevel spondylosis----mild stenosis. No  fractures  -scheduled lidoderm patches  - D/C'ed oxycodone for pain, tylenol and tramadol  -k pad   3. H/o depression/Mood: fluctuates but overall improved--limiting neurosedating meds  -keppra decreased back to 500mg  to help reduce any neurosedation   - zoloft (which she took at home)  -she needs a scheduled dose of an antipsychotic medication in my opinion for prophylaxis against these episodes, as it's obvious that her night time confusion is not related to "over medication".      4. Neuropsych: This patient is not capable of making decisions on her own behalf at this moment  5. A fib with PPM: Continue amiodarone and metoprolol. No anticoagulation at this time and f/u with cards in [place].     7. New onset seizure: on keppra bid--level therapeutic, Appreciate Neuro consult 8. Aspiration PNA: resolved   LOS (Days) 16 A FACE TO FACE EVALUATION WAS PERFORMED  SWARTZ,ZACHARY T 06/09/2013 8:33 AM

## 2013-06-09 NOTE — Plan of Care (Signed)
Problem: RH SAFETY Goal: RH STG ADHERE TO SAFETY PRECAUTIONS W/ASSISTANCE/DEVICE STG Adhere to Safety Precautions With min Assistance/Device.  Patient was agitated and uncooperative, refusing to adhere to safety precautions

## 2013-06-12 ENCOUNTER — Encounter (HOSPITAL_COMMUNITY): Payer: Self-pay | Admitting: Emergency Medicine

## 2013-06-12 ENCOUNTER — Emergency Department (HOSPITAL_COMMUNITY): Payer: Medicare Other

## 2013-06-12 ENCOUNTER — Emergency Department (HOSPITAL_COMMUNITY)
Admission: EM | Admit: 2013-06-12 | Discharge: 2013-06-13 | Disposition: A | Discharge status: Home / Self Care | Payer: Medicare Other | Attending: Emergency Medicine | Admitting: Emergency Medicine

## 2013-06-12 DIAGNOSIS — I10 Essential (primary) hypertension: Secondary | ICD-10-CM | POA: Insufficient documentation

## 2013-06-12 DIAGNOSIS — Z7982 Long term (current) use of aspirin: Secondary | ICD-10-CM | POA: Insufficient documentation

## 2013-06-12 DIAGNOSIS — F329 Major depressive disorder, single episode, unspecified: Secondary | ICD-10-CM | POA: Diagnosis not present

## 2013-06-12 DIAGNOSIS — F29 Unspecified psychosis not due to a substance or known physiological condition: Secondary | ICD-10-CM | POA: Insufficient documentation

## 2013-06-12 DIAGNOSIS — I619 Nontraumatic intracerebral hemorrhage, unspecified: Secondary | ICD-10-CM | POA: Diagnosis not present

## 2013-06-12 DIAGNOSIS — F4489 Other dissociative and conversion disorders: Secondary | ICD-10-CM

## 2013-06-12 DIAGNOSIS — M129 Arthropathy, unspecified: Secondary | ICD-10-CM | POA: Insufficient documentation

## 2013-06-12 DIAGNOSIS — F3289 Other specified depressive episodes: Secondary | ICD-10-CM | POA: Insufficient documentation

## 2013-06-12 DIAGNOSIS — Z79899 Other long term (current) drug therapy: Secondary | ICD-10-CM | POA: Diagnosis not present

## 2013-06-12 DIAGNOSIS — R404 Transient alteration of awareness: Secondary | ICD-10-CM | POA: Diagnosis not present

## 2013-06-12 DIAGNOSIS — R5381 Other malaise: Secondary | ICD-10-CM | POA: Diagnosis not present

## 2013-06-12 DIAGNOSIS — Z95810 Presence of automatic (implantable) cardiac defibrillator: Secondary | ICD-10-CM | POA: Insufficient documentation

## 2013-06-12 DIAGNOSIS — Z87891 Personal history of nicotine dependence: Secondary | ICD-10-CM | POA: Insufficient documentation

## 2013-06-12 DIAGNOSIS — R110 Nausea: Secondary | ICD-10-CM | POA: Insufficient documentation

## 2013-06-12 DIAGNOSIS — R0989 Other specified symptoms and signs involving the circulatory and respiratory systems: Secondary | ICD-10-CM | POA: Diagnosis not present

## 2013-06-12 LAB — COMPREHENSIVE METABOLIC PANEL
Albumin: 3.1 g/dL — ABNORMAL LOW (ref 3.5–5.2)
Alkaline Phosphatase: 86 U/L (ref 39–117)
BUN: 11 mg/dL (ref 6–23)
CO2: 26 mEq/L (ref 19–32)
Calcium: 8.8 mg/dL (ref 8.4–10.5)
Chloride: 103 mEq/L (ref 96–112)
Creatinine, Ser: 1.08 mg/dL (ref 0.50–1.10)
GFR calc non Af Amer: 47 mL/min — ABNORMAL LOW (ref >90)
Total Bilirubin: 0.3 mg/dL (ref 0.3–1.2)

## 2013-06-12 LAB — URINALYSIS, ROUTINE W REFLEX MICROSCOPIC
Glucose, UA: NEGATIVE mg/dL
Hgb urine dipstick: NEGATIVE
Ketones, ur: NEGATIVE mg/dL
Protein, ur: NEGATIVE mg/dL
Specific Gravity, Urine: 1.005 (ref 1.005–1.030)
Urobilinogen, UA: 0.2 mg/dL (ref 0.0–1.0)

## 2013-06-12 LAB — CBC WITH DIFFERENTIAL/PLATELET
Basophils Relative: 1 % (ref 0–1)
HCT: 36.1 % (ref 36.0–46.0)
Hemoglobin: 11.6 g/dL — ABNORMAL LOW (ref 12.0–15.0)
Lymphocytes Relative: 22 % (ref 12–46)
MCH: 27.4 pg (ref 26.0–34.0)
MCHC: 32.1 g/dL (ref 30.0–36.0)
MCV: 85.3 fL (ref 78.0–100.0)
Monocytes Absolute: 0.7 10*3/uL (ref 0.1–1.0)
Monocytes Relative: 13 % — ABNORMAL HIGH (ref 3–12)
Neutro Abs: 3.5 10*3/uL (ref 1.7–7.7)

## 2013-06-12 LAB — TROPONIN I: Troponin I: <0.3 ng/mL (ref <0.30)

## 2013-06-12 NOTE — ED Provider Notes (Signed)
CSN: [id number]     Arrival date & time 06/12/13  2139 History   First MD Initiated Contact with Patient 06/12/13 2158     Chief Complaint  Patient presents with  . Fatigue  . Nausea   Level V caveat for confusion  (Consider location/radiation/quality/duration/timing/severity/associated sxs/prior Treatment) HPI History mainly obtained from daughter. She has had some confusion tonight. Daughter reports her mother had a hemorrhagic stroke in her left frontal area about 3-1/2 weeks ago. It did not affect her as far as being weak in a particular arm or leg but did effect some of her cognitive function. She was sent to nursing home for rehabilitation about 3 days ago after being in the hospital rehabilitation facility. She states she's been all evening with her. This evening when the nursing home nurse woke her up to give her her medication patient seemed confused and was asking where her daughter was. Her daughter was called and she brought her to the emergency room. She states the patient was tired today however she was also tired. They had eaten junk food this afternoon. She states she still seems a little disoriented and her blood pressure had been high at the nursing facility. She did have some nausea earlier but denies having it now. She denies having any pain now.  PCP Dr. Elease Hashimoto Rehabilitation physician Dr. Hermelinda Medicus  Past Medical History  Diagnosis Date  . Intracerebral hemorrhage     a. 04/2013 in setting of xarelto therapy.  . Atrial fibrillation     a. Dx in 2012;  b. Rhytm controlled - amiodarone, previously anticoagulated with xarelto (d/c'd 04/2013 in setting of ICH);  b. 04/2013 Echo: EF 55-60%, Gr 1 DD, mild MR, mod dil LA, PAsP .  . Hypertension   . Depression   . Arthritis   . Presence of permanent cardiac pacemaker     a. 2012 MDT, placed in [place] (probable tachy-brady).  . Pacemaker    Past Surgical History  Procedure Laterality Date  . Pacemaker insertion     . Abdominal hysterectomy    . Breast lumpectomy    . Appendectomy    . Bowel resection     Family History  Problem Relation Age of Onset  . Other      negative for premature CAD.   History  Substance Use Topics  . Smoking status: Former Smoker    Quit date: 11/26/1968  . Smokeless tobacco: Not on file  . Alcohol Use: No   patient in rehabilitation 3 days. Was living at home.   OB History   Grav Para Term Preterm Abortions TAB SAB Ect Mult Living                 Review of Systems  All other systems reviewed and are negative.    Allergies  Review of patient's allergies indicates no known allergies.  Home Medications   Current Outpatient Rx  Name  Route  Sig  Dispense  Refill  . Acetaminophen (TYLENOL PO)   Oral   Take 1 tablet by mouth every 6 (six) hours as needed (for pain).         Marland Kitchen ALPRAZolam (XANAX) 0.25 MG tablet   Oral   Take 1 tablet (0.25 mg total) by mouth 2 (two) times daily as needed for anxiety.   10 tablet   0   . amiodarone (PACERONE) 200 MG tablet   Oral   Take 1 tablet (200 mg total) by mouth every morning.  30 tablet   1   . aspirin EC 81 MG EC tablet   Oral   Take 1 tablet (81 mg total) by mouth daily.         . famotidine (PEPCID) 20 MG tablet   Oral   Take 1 tablet (20 mg total) by mouth 2 (two) times daily.         . folic acid (FOLVITE) 400 MCG tablet   Oral   Take 400 mcg by mouth every evening.         . levETIRAcetam (KEPPRA) 500 MG tablet   Oral   Take 1 tablet (500 mg total) by mouth 2 (two) times daily.   60 tablet   1   . lidocaine (LIDODERM) 5 %   Transdermal   Place 2 patches onto the skin daily. Remove & Discard patch within 12 hours or as directed by MD   30 patch   0   . methocarbamol (ROBAXIN) 500 MG tablet   Oral   Take 1 tablet (500 mg total) by mouth every 6 (six) hours as needed for muscle spasms.         . metoprolol tartrate (LOPRESSOR) 25 MG tablet   Oral   Take 1 tablet (25 mg  total) by mouth 2 (two) times daily.   60 tablet   1   . Nutritional Supplements (JUICE PLUS FIBRE PO)   Oral   Take 1 tablet by mouth daily.         Marland Kitchen omeprazole (PRILOSEC) 20 MG capsule   Oral   Take 1 capsule (20 mg total) by mouth every morning.   30 capsule   1   . senna (SENOKOT) 8.6 MG TABS tablet   Oral   Take 2 tablets (17.2 mg total) by mouth at bedtime.   120 each   0   . sertraline (ZOLOFT) 50 MG tablet   Oral   Take 1 tablet (50 mg total) by mouth at bedtime.   30 tablet   1   . simvastatin (ZOCOR) 10 MG tablet   Oral   Take 1 tablet (10 mg total) by mouth every evening.   30 tablet   1   . traMADol (ULTRAM) 50 MG tablet   Oral   Take 1 tablet (50 mg total) by mouth 4 (four) times daily as needed (Moderate pain).   30 tablet   0   . vitamin B-12 (CYANOCOBALAMIN) 1000 MCG tablet   Oral   Take 1 tablet (1,000 mcg total) by mouth every evening.   30 tablet   1   . Vitamin D, Ergocalciferol, (DRISDOL) 50000 UNITS CAPS capsule   Oral   Take 1 capsule (50,000 Units total) by mouth every 14 (fourteen) days.   4 capsule   1    BP 156/65  Pulse 63  Temp(Src) 98.3 F (36.8 C) (Rectal)  Resp 18  Ht 5\' 3"  (1.6 m)  Wt 130 lb (58.968 kg)  BMI 23.03 kg/m2  SpO2 96%  Vital signs normal   Physical Exam  Nursing note and vitals reviewed. Constitutional: She is oriented to person, place, and time. She appears well-developed and well-nourished.  Non-toxic appearance. She does not appear ill. No distress.  Patient seems a little slow to respond. She thinks she's in [place] medical office  HENT:  Head: Normocephalic and atraumatic.  Right Ear: External ear normal.  Left Ear: External ear normal.  Nose: Nose normal. No mucosal edema or rhinorrhea.  Mouth/Throat: Oropharynx is clear and moist and mucous membranes are normal. No dental abscesses or uvula swelling.  Eyes: Conjunctivae and EOM are normal. Pupils are equal, round, and reactive to light.   Neck: Normal range of motion and full passive range of motion without pain. Neck supple.  Cardiovascular: Normal rate, regular rhythm and normal heart sounds.  Exam reveals no gallop and no friction rub.   No murmur heard. Pulmonary/Chest: Effort normal and breath sounds normal. No respiratory distress. She has no wheezes. She has no rhonchi. She has no rales. She exhibits no tenderness and no crepitus.  Abdominal: Soft. Normal appearance and bowel sounds are normal. She exhibits no distension. There is no tenderness. There is no rebound and no guarding.  Musculoskeletal: Normal range of motion. She exhibits no edema and no tenderness.  Moves all extremities well.   Neurological: She is alert and oriented to person, place, and time. She has normal strength. No cranial nerve deficit.  Skin: Skin is warm, dry and intact. No rash noted. No erythema. No pallor.  Psychiatric: Her speech is normal and behavior is normal. Her mood appears not anxious.  Flat affect, patient keeps grabbing her daughter's hand.    ED Course  Procedures (including critical care time)  23:30 Daughter states she is at her baseline, wants to ambulate to the bathroom. Waiting for her CMET and hopefully she can be discharged. She feels the patient woke up in a strange place (has only been there 3 days) with a unknown nurse and got confused.   Pt ambulated to the bathroom without SOB. Daughter states patient used to be on lasix for years and is unsure when it was taken off her medication list in addition to her advair. Pt is much more alert and interactive now. Feels ready to be discharged.    Labs Review Results for orders placed during the hospital encounter of 06/12/13  CBC WITH DIFFERENTIAL      Result Value Range   WBC 5.5  4.0 - 10.5 K/uL   RBC 4.23  3.87 - 5.11 MIL/uL   Hemoglobin 11.6 (*) 12.0 - 15.0 g/dL   HCT 29.5  62.1 - 30.8 %   MCV 85.3  78.0 - 100.0 fL   MCH 27.4  26.0 - 34.0 pg   MCHC 32.1  30.0 - 36.0  g/dL   RDW 65.7  84.6 - 96.2 %   Platelets 184  150 - 400 K/uL   Neutrophils Relative % 64  43 - 77 %   Neutro Abs 3.5  1.7 - 7.7 K/uL   Lymphocytes Relative 22  12 - 46 %   Lymphs Abs 1.2  0.7 - 4.0 K/uL   Monocytes Relative 13 (*) 3 - 12 %   Monocytes Absolute 0.7  0.1 - 1.0 K/uL   Eosinophils Relative 2  0 - 5 %   Eosinophils Absolute 0.1  0.0 - 0.7 K/uL   Basophils Relative 1  0 - 1 %   Basophils Absolute 0.0  0.0 - 0.1 K/uL  COMPREHENSIVE METABOLIC PANEL      Result Value Range   Sodium 140  135 - 145 mEq/L   Potassium 4.0  3.5 - 5.1 mEq/L   Chloride 103  96 - 112 mEq/L   CO2 26  19 - 32 mEq/L   Glucose, Bld 104 (*) 70 - 99 mg/dL   BUN 11  6 - 23 mg/dL   Creatinine, Ser 9.52  0.50 - 1.10 mg/dL  Calcium 8.8  8.4 - 10.5 mg/dL   Total Protein 6.6  6.0 - 8.3 g/dL   Albumin 3.1 (*) 3.5 - 5.2 g/dL   AST 33  0 - 37 U/L   ALT 26  0 - 35 U/L   Alkaline Phosphatase 86  39 - 117 U/L   Total Bilirubin 0.3  0.3 - 1.2 mg/dL   GFR calc non Af Amer 47 (*) >90 mL/min   GFR calc Af Amer 54 (*) >90 mL/min  URINALYSIS, ROUTINE W REFLEX MICROSCOPIC      Result Value Range   Color, Urine YELLOW  YELLOW   APPearance CLEAR  CLEAR   Specific Gravity, Urine 1.005  1.005 - 1.030   pH 6.5  5.0 - 8.0   Glucose, UA NEGATIVE  NEGATIVE mg/dL   Hgb urine dipstick NEGATIVE  NEGATIVE   Bilirubin Urine NEGATIVE  NEGATIVE   Ketones, ur NEGATIVE  NEGATIVE mg/dL   Protein, ur NEGATIVE  NEGATIVE mg/dL   Urobilinogen, UA 0.2  0.0 - 1.0 mg/dL   Nitrite NEGATIVE  NEGATIVE   Leukocytes, UA TRACE (*) NEGATIVE  TROPONIN I      Result Value Range   Troponin I <0.30  <0.30 ng/mL  URINE MICROSCOPIC-ADD ON      Result Value Range   Squamous Epithelial / LPF RARE  RARE   WBC, UA 3-6  <3 WBC/hpf   Bacteria, UA RARE  RARE    Laboratory interpretation all normal      Imaging Review Dg Chest 2 View  06/12/2013   CLINICAL DATA:  Fatigue, nausea and confusion.  EXAM: CHEST  2 VIEW  COMPARISON:  Chest  radiograph performed 05/26/2013  FINDINGS: The lungs are well-aerated. Vascular congestion is noted. Mild right perihilar airspace opacity may reflect mild pneumonia or possibly minimal asymmetric interstitial edema, improved from the prior study. No pleural effusion or pneumothorax is seen.  The heart is borderline normal in size. A pacemaker is noted at the left chest wall, with leads ending at the right atrium and right ventricle. No acute osseous abnormalities are seen.  IMPRESSION: Vascular congestion noted. Mild right perihilar airspace opacity may reflect mild pneumonia or possibly minimal asymmetric interstitial edema, improved from the prior study.   Electronically Signed   By: Roanna Raider M.D.   On: 06/12/2013 23:16   Ct Head Wo Contrast  06/12/2013   CLINICAL DATA:  Change in mental status; increased fatigue and hypertension.  EXAM: CT HEAD WITHOUT CONTRAST  TECHNIQUE: Contiguous axial images were obtained from the base of the skull through the vertex without intravenous contrast.  COMPARISON:  CT of the head performed 06/06/2013  FINDINGS: The evolving left frontal lobe hematoma is relatively stable in size, measuring approximately 2.7 x 1.8 cm on the current study. Surrounding vasogenic edema is again seen, perhaps slightly worsened from the prior study along the inferior edge of the hematoma. No new intracranial hemorrhage is seen. No significant midline shift is appreciated, though mild associated mass effect is noted.  The posterior fossa, including the cerebellum, brainstem and fourth ventricle, is within normal limits. The third and lateral ventricles, and basal ganglia are unremarkable in appearance.  There is no evidence of fracture; visualized osseous structures are unremarkable in appearance. The orbits are within normal limits. The paranasal sinuses and mastoid air cells are well-aerated. No significant soft tissue abnormalities are seen.  IMPRESSION: 1. Relatively stable appearance to  evolving left frontal lobe hematoma, with perhaps slightly worsened vasogenic edema.  Mild associated localized mass effect again noted, without evidence of midline shift. 2. Otherwise unremarkable noncontrast CT of the head.   Electronically Signed   By: Roanna Raider M.D.   On: 06/12/2013 23:14    EKG Interpretation    Date/Time:  Sunday June 12 2013 22:30:07 EST Ventricular Rate:  60 PR Interval:  211 QRS Duration: 93 QT Interval:  491 QTC Calculation: 491 R Axis:   -41 Text Interpretation:  Atrial-paced rhythm Inferior infarct, old Anteroseptal infarct, old No significant change since last tracing Confirmed by Honore Wipperfurth  MD-I, Franciszek Platten ([id number]) on 06/12/2013 10:45:38 PM            MDM   1. Confusion state    Plan discharge  Devoria Albe, MD, Franz Dell, MD 06/13/13 [phone]

## 2013-06-12 NOTE — ED Notes (Signed)
Bed: ZO10 Expected date:  Expected time:  Means of arrival:  Comments: EMS 79 yo Generalized weakness, fever

## 2013-06-12 NOTE — ED Notes (Signed)
Daughter reports change in mental status from earlier today.  Daughter reports patient is more fatigue and bp spiked today.  Daughter wants to make sure all meds she was getting 3 days ago in the hospital are the same as the ones she is receiving at [facility] and make sure no other stroke.  Pt has hemorrhagic stroke in November was hospitalized for 3 weeks and dc'd 3 days ago.  Stroke caused by Xarelto.

## 2013-06-12 NOTE — ED Notes (Signed)
EMS reports patient having fatigue and weakness that started today.  Pt had stroke a few weeks ago and was checked for UTI prior to discharge.  Pt was just moved to assisted living facility. Pt A&O x 4.  Pt also reports nausea.

## 2013-06-13 ENCOUNTER — Telehealth: Payer: Self-pay | Admitting: Neurology

## 2013-06-13 NOTE — Telephone Encounter (Signed)
Patient's daughter called about appointment with Dr. Pearlean Brownie for patient, patient was discharged from hospital on 06/08/13 and was instructed to make an appointment in 4 weeks. Per Dr. Marlis Edelson schedule, nothing within time frame, first available was 08/23/13. Please call patient's daughter.

## 2013-06-14 NOTE — Telephone Encounter (Signed)
I have called the numbers provided no one  will try again at a later time for a 2 month stroke f/u with lynn lam.

## 2013-06-20 DIAGNOSIS — R262 Difficulty in walking, not elsewhere classified: Secondary | ICD-10-CM | POA: Diagnosis not present

## 2013-06-20 DIAGNOSIS — R279 Unspecified lack of coordination: Secondary | ICD-10-CM | POA: Diagnosis not present

## 2013-06-20 DIAGNOSIS — M6281 Muscle weakness (generalized): Secondary | ICD-10-CM | POA: Diagnosis not present

## 2013-06-20 DIAGNOSIS — I619 Nontraumatic intracerebral hemorrhage, unspecified: Secondary | ICD-10-CM | POA: Diagnosis not present

## 2013-06-24 DIAGNOSIS — R262 Difficulty in walking, not elsewhere classified: Secondary | ICD-10-CM | POA: Diagnosis not present

## 2013-06-24 DIAGNOSIS — I619 Nontraumatic intracerebral hemorrhage, unspecified: Secondary | ICD-10-CM | POA: Diagnosis not present

## 2013-06-24 DIAGNOSIS — M6281 Muscle weakness (generalized): Secondary | ICD-10-CM | POA: Diagnosis not present

## 2013-06-24 DIAGNOSIS — R279 Unspecified lack of coordination: Secondary | ICD-10-CM | POA: Diagnosis not present

## 2013-06-27 DIAGNOSIS — B351 Tinea unguium: Secondary | ICD-10-CM | POA: Diagnosis not present

## 2013-06-27 DIAGNOSIS — I4891 Unspecified atrial fibrillation: Secondary | ICD-10-CM | POA: Diagnosis not present

## 2013-06-27 DIAGNOSIS — I6340 Cerebral infarction due to embolism of unspecified cerebral artery: Secondary | ICD-10-CM | POA: Diagnosis not present

## 2013-06-27 DIAGNOSIS — R4182 Altered mental status, unspecified: Secondary | ICD-10-CM | POA: Diagnosis not present

## 2013-06-27 DIAGNOSIS — I495 Sick sinus syndrome: Secondary | ICD-10-CM | POA: Diagnosis not present

## 2013-06-27 DIAGNOSIS — I10 Essential (primary) hypertension: Secondary | ICD-10-CM | POA: Diagnosis not present

## 2013-06-27 DIAGNOSIS — I6200 Nontraumatic subdural hemorrhage, unspecified: Secondary | ICD-10-CM | POA: Diagnosis not present

## 2013-06-27 DIAGNOSIS — E559 Vitamin D deficiency, unspecified: Secondary | ICD-10-CM | POA: Diagnosis not present

## 2013-07-05 DIAGNOSIS — L608 Other nail disorders: Secondary | ICD-10-CM | POA: Diagnosis not present

## 2013-07-05 DIAGNOSIS — Q6689 Other  specified congenital deformities of feet: Secondary | ICD-10-CM | POA: Diagnosis not present

## 2013-07-11 DIAGNOSIS — I4891 Unspecified atrial fibrillation: Secondary | ICD-10-CM | POA: Diagnosis not present

## 2013-07-11 DIAGNOSIS — E785 Hyperlipidemia, unspecified: Secondary | ICD-10-CM | POA: Diagnosis not present

## 2013-07-11 DIAGNOSIS — K219 Gastro-esophageal reflux disease without esophagitis: Secondary | ICD-10-CM | POA: Diagnosis not present

## 2013-07-11 DIAGNOSIS — I69920 Aphasia following unspecified cerebrovascular disease: Secondary | ICD-10-CM | POA: Diagnosis not present

## 2013-07-11 DIAGNOSIS — I629 Nontraumatic intracranial hemorrhage, unspecified: Secondary | ICD-10-CM | POA: Diagnosis not present

## 2013-07-11 DIAGNOSIS — R7989 Other specified abnormal findings of blood chemistry: Secondary | ICD-10-CM | POA: Diagnosis not present

## 2013-07-11 DIAGNOSIS — I10 Essential (primary) hypertension: Secondary | ICD-10-CM | POA: Diagnosis not present

## 2013-07-15 ENCOUNTER — Encounter: Payer: Medicare Other | Attending: Physical Medicine & Rehabilitation | Admitting: Physical Medicine & Rehabilitation

## 2013-07-15 ENCOUNTER — Encounter: Payer: Self-pay | Admitting: Physical Medicine & Rehabilitation

## 2013-07-15 VITALS — BP 142/79 | HR 78 | Resp 14 | Ht 63.0 in | Wt 159.0 lb

## 2013-07-15 DIAGNOSIS — R569 Unspecified convulsions: Secondary | ICD-10-CM | POA: Diagnosis not present

## 2013-07-15 DIAGNOSIS — Z79899 Other long term (current) drug therapy: Secondary | ICD-10-CM | POA: Diagnosis not present

## 2013-07-15 DIAGNOSIS — I10 Essential (primary) hypertension: Secondary | ICD-10-CM | POA: Insufficient documentation

## 2013-07-15 DIAGNOSIS — Z950 Presence of cardiac pacemaker: Secondary | ICD-10-CM | POA: Diagnosis not present

## 2013-07-15 DIAGNOSIS — I619 Nontraumatic intracerebral hemorrhage, unspecified: Secondary | ICD-10-CM

## 2013-07-15 DIAGNOSIS — M47817 Spondylosis without myelopathy or radiculopathy, lumbosacral region: Secondary | ICD-10-CM | POA: Insufficient documentation

## 2013-07-15 DIAGNOSIS — I69998 Other sequelae following unspecified cerebrovascular disease: Secondary | ICD-10-CM | POA: Insufficient documentation

## 2013-07-15 DIAGNOSIS — F3289 Other specified depressive episodes: Secondary | ICD-10-CM | POA: Insufficient documentation

## 2013-07-15 DIAGNOSIS — R410 Disorientation, unspecified: Secondary | ICD-10-CM | POA: Diagnosis not present

## 2013-07-15 DIAGNOSIS — I4891 Unspecified atrial fibrillation: Secondary | ICD-10-CM

## 2013-07-15 DIAGNOSIS — I480 Paroxysmal atrial fibrillation: Secondary | ICD-10-CM

## 2013-07-15 DIAGNOSIS — F05 Delirium due to known physiological condition: Secondary | ICD-10-CM

## 2013-07-15 DIAGNOSIS — I69928 Other speech and language deficits following unspecified cerebrovascular disease: Secondary | ICD-10-CM | POA: Insufficient documentation

## 2013-07-15 DIAGNOSIS — F329 Major depressive disorder, single episode, unspecified: Secondary | ICD-10-CM | POA: Insufficient documentation

## 2013-07-15 NOTE — Patient Instructions (Addendum)
CONTINUE TO WORK ON EXERCISES WHICH PUSH YOUR READING AND CONVERSATIONAL ABILITIES. DON'T GET FRUSTRATED IF YOU CAN'T FIND THE WORDS. RE-SET AND TRY AGAIN!!!   AN EYE EXAM IS RECOMMENDED!   RECOMMEND SPEECH THERAPY AT LEAST 3X PER WEEK TO START.

## 2013-07-15 NOTE — Progress Notes (Signed)
Subjective:    Patient ID: Ann Kaufman, female    DOB: [birth year], 80 y.o.   MRN: [id number]  HPI  Ann Kaufman is back regarding her ICH. She is receiving therapies (PT, OT, SLP) at [facility]. The speech therapy has been fairly inconsistent. PT and OT seem to be focusing on strength training and ROM. She is still having word finding issues which can frustrate her. She has been pretty safe with her mobility and self-care tasks. There have been no falls or safety issues. She often has done her morning chores before help comes in. Her daughter is looking into the next living venue and has questions as how to proceed. Ann Kaufman doesn't like being around all of the "old people" at the facility.    Pain Inventory Average Pain 2 Pain Right Now 2 My pain is intermittent and dull  In the last 24 hours, has pain interfered with the following? General activity 0 Relation with others 0 Enjoyment of life 0 What TIME of day is your pain at its worst? varies Sleep (in general) Good  Pain is worse with: unsure Pain improves with: rest Relief from Meds: only takes meds PRN  Mobility walk with assistance use a walker how many minutes can you walk? 30 ability to climb steps?  yes do you drive?  no Do you have any goals in this area?  yes  Function not employed: date last employed na retired  Neuro/Psych numbness tremor dizziness confusion depression anxiety  Prior Studies Any changes since last visit?  yes x-rays CT/MRI  Physicians involved in your care Any changes since last visit?  no   Family History  Problem Relation Age of Onset  . Other      negative for premature CAD.   History   Social History  . Marital Status: Married    Spouse Name: N/A    Number of Children: N/A  . Years of Education: N/A   Social History Main Topics  . Smoking status: Former Smoker    Quit date: 11/26/1968  . Smokeless tobacco: None  . Alcohol Use: No  . Drug Use: No    . Sexual Activity: None   Other Topics Concern  . None   Social History Narrative   Lives in [place] by herself.  Daughter lives in [place].   Past Surgical History  Procedure Laterality Date  . Pacemaker insertion    . Abdominal hysterectomy    . Breast lumpectomy    . Appendectomy    . Bowel resection     Past Medical History  Diagnosis Date  . Intracerebral hemorrhage     a. 04/2013 in setting of xarelto therapy.  . Atrial fibrillation     a. Dx in 2012;  b. Rhytm controlled - amiodarone, previously anticoagulated with xarelto (d/c'd 04/2013 in setting of [place]);  b. 04/2013 Echo: EF 55-60%, Gr 1 DD, mild MR, mod dil LA, PAsP 18mmhg.  . Hypertension   . Depression   . Arthritis   . Presence of permanent cardiac pacemaker     a. 2012 MDT, placed in [place] (probable tachy-brady).  . Pacemaker    BP 142/79  Pulse 78  Resp 14  Ht 5\' 3"  (1.6 m)  Wt 159 lb (72.122 kg)  BMI 28.17 kg/m2  SpO2 97%      Review of Systems  Neurological: Positive for dizziness, tremors and numbness.  Psychiatric/Behavioral: Positive for confusion and dysphoric mood. The patient is nervous/anxious.  All other systems reviewed and are negative.       Objective:   Physical Exam  Constitutional: She is oriented to person, place, and time. She appears well-developed and well-nourished.  HENT:  Head: Normocephalic and atraumatic.  Eyes: Conjunctivae and EOM are normal. Pupils are equal, round, and reactive to light. Right eye exhibits no discharge. Left eye exhibits no discharge. No scleral icterus.  Neck: Normal range of motion. Neck supple.  Cardiovascular: Normal rate and regular rhythm. Exam reveals no gallop and no friction rub.  No murmur heard.  Respiratory: Effort normal. No respiratory distress. She has no wheezes.  GI: Soft. Bowel sounds are normal. She exhibits no distension. There is no tenderness.  Musculoskeletal: She exhibits no edema. Minimal low back pain today at  rest  Neurological: She is drowsy, distracted, confused. Limited insight and awareness Right facial weakness improved. Speech clearer.. Language is apraxic.  sometimes LOC. Extremely alert. Follows simple commands. Identified my watch, glasses, telephone. Had difficulty telling the month "january" but could tell me the date in numbers. Sequenced simple numbers but could not do serial 7's. Spelled "world" quickly forward but could not do it backwards. Moves all 4's fairly equally now although she still has some difficulty with fine motor movements of the right hand. She past points with both hands, right more than left. Gait was fairly stable although she drifts to the right. When she stood initially she lost balance slightly backward but recovered. Romberg testing was notable for loss of balance posteriorly  Skin: Skin is warm and dry.  A few limb bruises noted  Psychiatric: pleasant, appropriate, carried a conversation despite language issues. Good insight and awareness overall       Assessment/Plan:  1. Functional deficits secondary to left frontal hematoma with seizure activity   -recommend ongoing PT and SLP with focus on  language and balance  -discussed implementation of reading, computer programs, etc which incorporate language and organization skills  -advance to ILF eventually. Life alert and frequent intermittent supervision would be recommended initially. 2. Lumbar spondylosis--lidoderm patches prn, tylenol, limit ultram 3. H/o depression/Mood: fluctuates but overall improved--limiting neurosedating meds  - zoloft (which she took at home)  4. Vision- recommend optho eye evaluation to assess vision/depth perception.   -might benefit from corrective lenses to assist with safety/balance  5. A fib with PPM.  7. New onset seizure: on keppra bid

## 2013-07-19 DIAGNOSIS — I629 Nontraumatic intracranial hemorrhage, unspecified: Secondary | ICD-10-CM | POA: Diagnosis not present

## 2013-07-19 DIAGNOSIS — F329 Major depressive disorder, single episode, unspecified: Secondary | ICD-10-CM | POA: Diagnosis not present

## 2013-07-19 DIAGNOSIS — I10 Essential (primary) hypertension: Secondary | ICD-10-CM | POA: Diagnosis not present

## 2013-07-19 DIAGNOSIS — E785 Hyperlipidemia, unspecified: Secondary | ICD-10-CM | POA: Diagnosis not present

## 2013-07-19 DIAGNOSIS — R7989 Other specified abnormal findings of blood chemistry: Secondary | ICD-10-CM | POA: Diagnosis not present

## 2013-07-19 DIAGNOSIS — K219 Gastro-esophageal reflux disease without esophagitis: Secondary | ICD-10-CM | POA: Diagnosis not present

## 2013-07-19 DIAGNOSIS — F3289 Other specified depressive episodes: Secondary | ICD-10-CM | POA: Diagnosis not present

## 2013-07-19 DIAGNOSIS — F411 Generalized anxiety disorder: Secondary | ICD-10-CM | POA: Diagnosis not present

## 2013-07-22 ENCOUNTER — Telehealth: Payer: Self-pay

## 2013-07-22 NOTE — Telephone Encounter (Signed)
Santiago Glad( Patient's daughter) is requesting a letter to be sent to [employer] stating that at patient's OV on 1/23 Lidocaine patches were D/C, and also patient needs a order for speech therapy.

## 2013-07-22 NOTE — Telephone Encounter (Signed)
Does she want outpatient speech therapy?  Letter being written

## 2013-07-24 ENCOUNTER — Emergency Department (HOSPITAL_COMMUNITY): Payer: Medicare Other

## 2013-07-24 ENCOUNTER — Encounter (HOSPITAL_COMMUNITY): Payer: Self-pay | Admitting: Emergency Medicine

## 2013-07-24 ENCOUNTER — Inpatient Hospital Stay (HOSPITAL_COMMUNITY)
Admission: EM | Admit: 2013-07-24 | Discharge: 2013-07-25 | DRG: 069 | Disposition: A | Discharge status: Home / Self Care | Payer: Medicare Other | Attending: Internal Medicine | Admitting: Internal Medicine

## 2013-07-24 DIAGNOSIS — F329 Major depressive disorder, single episode, unspecified: Secondary | ICD-10-CM | POA: Diagnosis present

## 2013-07-24 DIAGNOSIS — F3289 Other specified depressive episodes: Secondary | ICD-10-CM | POA: Diagnosis present

## 2013-07-24 DIAGNOSIS — M129 Arthropathy, unspecified: Secondary | ICD-10-CM | POA: Diagnosis present

## 2013-07-24 DIAGNOSIS — I480 Paroxysmal atrial fibrillation: Secondary | ICD-10-CM

## 2013-07-24 DIAGNOSIS — N390 Urinary tract infection, site not specified: Secondary | ICD-10-CM | POA: Diagnosis present

## 2013-07-24 DIAGNOSIS — E785 Hyperlipidemia, unspecified: Secondary | ICD-10-CM | POA: Diagnosis present

## 2013-07-24 DIAGNOSIS — G9389 Other specified disorders of brain: Secondary | ICD-10-CM | POA: Diagnosis not present

## 2013-07-24 DIAGNOSIS — R569 Unspecified convulsions: Secondary | ICD-10-CM | POA: Diagnosis not present

## 2013-07-24 DIAGNOSIS — R4182 Altered mental status, unspecified: Secondary | ICD-10-CM | POA: Diagnosis present

## 2013-07-24 DIAGNOSIS — Z87891 Personal history of nicotine dependence: Secondary | ICD-10-CM

## 2013-07-24 DIAGNOSIS — I639 Cerebral infarction, unspecified: Secondary | ICD-10-CM

## 2013-07-24 DIAGNOSIS — Z7982 Long term (current) use of aspirin: Secondary | ICD-10-CM

## 2013-07-24 DIAGNOSIS — I6350 Cerebral infarction due to unspecified occlusion or stenosis of unspecified cerebral artery: Secondary | ICD-10-CM | POA: Diagnosis not present

## 2013-07-24 DIAGNOSIS — R4701 Aphasia: Secondary | ICD-10-CM | POA: Diagnosis not present

## 2013-07-24 DIAGNOSIS — I4891 Unspecified atrial fibrillation: Secondary | ICD-10-CM

## 2013-07-24 DIAGNOSIS — Z79899 Other long term (current) drug therapy: Secondary | ICD-10-CM

## 2013-07-24 DIAGNOSIS — G459 Transient cerebral ischemic attack, unspecified: Secondary | ICD-10-CM | POA: Diagnosis present

## 2013-07-24 DIAGNOSIS — Z950 Presence of cardiac pacemaker: Secondary | ICD-10-CM | POA: Diagnosis not present

## 2013-07-24 DIAGNOSIS — Z8673 Personal history of transient ischemic attack (TIA), and cerebral infarction without residual deficits: Secondary | ICD-10-CM

## 2013-07-24 DIAGNOSIS — I10 Essential (primary) hypertension: Secondary | ICD-10-CM | POA: Diagnosis present

## 2013-07-24 DIAGNOSIS — I482 Chronic atrial fibrillation, unspecified: Secondary | ICD-10-CM | POA: Diagnosis present

## 2013-07-24 DIAGNOSIS — G40909 Epilepsy, unspecified, not intractable, without status epilepticus: Secondary | ICD-10-CM | POA: Diagnosis present

## 2013-07-24 LAB — CBC
HCT: 39.7 % (ref 36.0–46.0)
Hemoglobin: 12.8 g/dL (ref 12.0–15.0)
MCH: 26.9 pg (ref 26.0–34.0)
MCHC: 32.2 g/dL (ref 30.0–36.0)
MCV: 83.4 fL (ref 78.0–100.0)
Platelets: 175 10*3/uL (ref 150–400)
RBC: 4.76 MIL/uL (ref 3.87–5.11)
RDW: 14.4 % (ref 11.5–15.5)
WBC: 6 10*3/uL (ref 4.0–10.5)

## 2013-07-24 LAB — TROPONIN I

## 2013-07-24 LAB — POCT I-STAT TROPONIN I: Troponin i, poc: 0 ng/mL (ref 0.00–0.08)

## 2013-07-24 LAB — COMPREHENSIVE METABOLIC PANEL
ALBUMIN: 3.8 g/dL (ref 3.5–5.2)
ALT: 17 U/L (ref 0–35)
AST: 26 U/L (ref 0–37)
Alkaline Phosphatase: 96 U/L (ref 39–117)
BUN: 19 mg/dL (ref 6–23)
CALCIUM: 9.5 mg/dL (ref 8.4–10.5)
CO2: 27 mEq/L (ref 19–32)
Chloride: 102 mEq/L (ref 96–112)
Creatinine, Ser: 0.98 mg/dL (ref 0.50–1.10)
GFR calc Af Amer: 61 mL/min — ABNORMAL LOW (ref >90)
GFR calc non Af Amer: 53 mL/min — ABNORMAL LOW (ref >90)
GLUCOSE: 109 mg/dL — AB (ref 70–99)
Potassium: 3.9 mEq/L (ref 3.7–5.3)
SODIUM: 141 meq/L (ref 137–147)
TOTAL PROTEIN: 7.7 g/dL (ref 6.0–8.3)
Total Bilirubin: 0.4 mg/dL (ref 0.3–1.2)

## 2013-07-24 LAB — POCT I-STAT, CHEM 8
BUN: 21 mg/dL (ref 6–23)
CALCIUM ION: 1.21 mmol/L (ref 1.13–1.30)
Chloride: 102 mEq/L (ref 96–112)
Creatinine, Ser: 1.1 mg/dL (ref 0.50–1.10)
Glucose, Bld: 102 mg/dL — ABNORMAL HIGH (ref 70–99)
HEMATOCRIT: 38 % (ref 36.0–46.0)
Hemoglobin: 12.9 g/dL (ref 12.0–15.0)
Potassium: 3.6 mEq/L — ABNORMAL LOW (ref 3.7–5.3)
Sodium: 142 mEq/L (ref 137–147)
TCO2: 30 mmol/L (ref 0–100)

## 2013-07-24 LAB — RAPID URINE DRUG SCREEN, HOSP PERFORMED
AMPHETAMINES: NOT DETECTED
BENZODIAZEPINES: NOT DETECTED
Barbiturates: NOT DETECTED
COCAINE: NOT DETECTED
Opiates: NOT DETECTED
Tetrahydrocannabinol: NOT DETECTED

## 2013-07-24 LAB — PROTIME-INR
INR: 1.02 (ref 0.00–1.49)
PROTHROMBIN TIME: 13.2 s (ref 11.6–15.2)

## 2013-07-24 LAB — URINALYSIS, ROUTINE W REFLEX MICROSCOPIC
BILIRUBIN URINE: NEGATIVE
GLUCOSE, UA: NEGATIVE mg/dL
KETONES UR: NEGATIVE mg/dL
Nitrite: POSITIVE — AB
PH: 6 (ref 5.0–8.0)
Protein, ur: NEGATIVE mg/dL
Specific Gravity, Urine: 1.023 (ref 1.005–1.030)
Urobilinogen, UA: 1 mg/dL (ref 0.0–1.0)

## 2013-07-24 LAB — APTT: aPTT: 26 seconds (ref 24–37)

## 2013-07-24 LAB — DIFFERENTIAL
BASOS PCT: 1 % (ref 0–1)
Basophils Absolute: 0 10*3/uL (ref 0.0–0.1)
EOS ABS: 0.1 10*3/uL (ref 0.0–0.7)
EOS PCT: 1 % (ref 0–5)
LYMPHS ABS: 1.7 10*3/uL (ref 0.7–4.0)
Lymphocytes Relative: 29 % (ref 12–46)
Monocytes Absolute: 0.7 10*3/uL (ref 0.1–1.0)
Monocytes Relative: 12 % (ref 3–12)
NEUTROS PCT: 57 % (ref 43–77)
Neutro Abs: 3.4 10*3/uL (ref 1.7–7.7)

## 2013-07-24 LAB — URINE MICROSCOPIC-ADD ON

## 2013-07-24 LAB — ETHANOL: Alcohol, Ethyl (B): <11 mg/dL (ref 0–11)

## 2013-07-24 LAB — GLUCOSE, CAPILLARY: Glucose-Capillary: 90 mg/dL (ref 70–99)

## 2013-07-24 NOTE — H&P (Signed)
[facility] History and Physical  Ann Kaufman DOB: 04-03-1930 DOA: 07/24/2013  Referring physician: ER physician. PCP: Lorie Phenix, MD  Chief Complaint: Speaking difficulties and right upper extremity weakness.  HPI: Ann Kaufman is a 80 y.o. female with history of intracranial hemorrhage in November 2014 while being on xarelto for atrial fibrillation was found to have difficulty speaking and right upper extremity weakness around 6:30 PM. Patient was brought to the ER by patient's daughter. By then patient's symptoms have resolved. Patient's symptoms resolved by half hour. CT head did not show anything acute. On-call neurologist, Dr. Thad Ranger was consulted by ER physician. Since patient's symptoms are resolved and has had infected or bleed was not a candidate for TPA. Patient has paced difficulties from her previous intracranial hemorrhage. Patient otherwise denies any visual symptoms difficulty swallowing. Has been having some headache off and on the left temporal area. Denies any weakness of the other extremities. Denies nausea vomiting abdominal pain diarrhea.  Review of Systems: As presented in the history of presenting illness, rest negative.  Past Medical History  Diagnosis Date  . Intracerebral hemorrhage     a. 04/2013 in setting of xarelto therapy.  . Atrial fibrillation     a. Dx in 2012;  b. Rhytm controlled - amiodarone, previously anticoagulated with xarelto (d/c'd 04/2013 in setting of ICH);  b. 04/2013 Echo: EF 55-60%, Gr 1 DD, mild MR, mod dil LA, PAsP .  . Hypertension   . Depression   . Arthritis   . Presence of permanent cardiac pacemaker     a. 2012 MDT, placed in [place] (probable tachy-brady).  . Pacemaker    Past Surgical History  Procedure Laterality Date  . Pacemaker insertion    . Abdominal hysterectomy    . Breast lumpectomy    . Appendectomy    . Bowel resection     Social History:  reports that she quit smoking about  44 years ago. She does not have any smokeless tobacco history on file. She reports that she does not drink alcohol or use illicit drugs. Where does patient live independent living facility. Can patient participate in ADLs? Yes.  No Known Allergies  Family History:  Family History  Problem Relation Age of Onset  . Other      negative for premature CAD.      Prior to Admission medications   Medication Sig Start Date End Date Taking? Authorizing Provider  amiodarone (PACERONE) 200 MG tablet Take 1 tablet (200 mg total) by mouth every morning. 06/08/13  Yes Evlyn Kanner Love, PA-C  aspirin EC 81 MG EC tablet Take 1 tablet (81 mg total) by mouth daily. 06/08/13  Yes Evlyn Kanner Love, PA-C  diclofenac (FLECTOR) 1.3 % PTCH Place 1 patch onto the skin 2 (two) times daily.   Yes Historical Provider, MD  famotidine (PEPCID) 20 MG tablet Take 1 tablet (20 mg total) by mouth 2 (two) times daily. 06/08/13  Yes Evlyn Kanner Love, PA-C  folic acid (FOLVITE) 400 MCG tablet Take 400 mcg by mouth every evening.   Yes Historical Provider, MD  levETIRAcetam (KEPPRA) 500 MG tablet Take 1 tablet (500 mg total) by mouth 2 (two) times daily. 06/08/13  Yes Evlyn Kanner Love, PA-C  metoprolol tartrate (LOPRESSOR) 25 MG tablet Take 1 tablet (25 mg total) by mouth 2 (two) times daily. 06/08/13  Yes Evlyn Kanner Love, PA-C  omeprazole (PRILOSEC) 20 MG capsule Take 1 capsule (20 mg total) by mouth every morning. 06/08/13  Yes Ivan Anchors Love, PA-C  PRESCRIPTION MEDICATION ABR 07-17-08 (ativan,benadryl, reglan)  apply to inner wrist every 4 hours as needed for anxiety.   Yes Historical Provider, MD  senna (SENOKOT) 8.6 MG TABS tablet Take 2 tablets (17.2 mg total) by mouth at bedtime. 06/08/13  Yes Ivan Anchors Love, PA-C  sertraline (ZOLOFT) 50 MG tablet Take 1 tablet (50 mg total) by mouth at bedtime. 06/08/13  Yes Ivan Anchors Love, PA-C  simvastatin (ZOCOR) 10 MG tablet Take 1 tablet (10 mg total) by mouth every evening. 06/08/13  Yes Ivan Anchors  Love, PA-C  vitamin B-12 (CYANOCOBALAMIN) 1000 MCG tablet Take 1 tablet (1,000 mcg total) by mouth every evening. 06/08/13  Yes Ivan Anchors Love, PA-C  ALPRAZolam (XANAX) 0.25 MG tablet Take 0.25 mg by mouth at bedtime as needed for anxiety.    Historical Provider, MD  methocarbamol (ROBAXIN) 500 MG tablet Take 1 tablet (500 mg total) by mouth every 6 (six) hours as needed for muscle spasms. 06/08/13   Bary Leriche, PA-C  traMADol (ULTRAM) 50 MG tablet Take 1 tablet (50 mg total) by mouth 4 (four) times daily as needed (Moderate pain). 06/08/13   Bary Leriche, PA-C  Vitamin D, Ergocalciferol, (DRISDOL) 50000 UNITS CAPS capsule Take 1 capsule (50,000 Units total) by mouth every 14 (fourteen) days. 06/08/13   Bary Leriche, PA-C    Physical Exam: Filed Vitals:   07/24/13 1932 07/24/13 2002 07/24/13 2059  BP: 162/73    Pulse: 60    Temp:   97.6 F (36.4 C)  Resp: 22    Height:  5\' 3"  (1.6 m)   Weight:  74.844 kg (165 lb)   SpO2: 100%       General:  Well-developed and nourished.  Eyes: Anicteric no pallor. PERRLA positive.  ENT: No discharge from the ears eyes nose mouth.  Neck: No mass felt.  Cardiovascular: S1-S2 heard.  Respiratory: No rhonchi or crepitations.  Abdomen: Soft nontender bowel sounds present.  Skin: No rash.  Musculoskeletal: No edema.  Psychiatric: Appears normal.  Neurologic: Alert awake oriented to time place and person. Moves all extremities 5 x 5. No facial asymmetry. Tongue is midline.  Labs on Admission:  Basic Metabolic Panel:  Recent Labs Lab 07/24/13 1935 07/24/13 1944  NA 141 142  K 3.9 3.6*  CL 102 102  CO2 27  --   GLUCOSE 109* 102*  BUN 19 21  CREATININE 0.98 1.10  CALCIUM 9.5  --    Liver Function Tests:  Recent Labs Lab 07/24/13 1935  AST 26  ALT 17  ALKPHOS 96  BILITOT 0.4  PROT 7.7  ALBUMIN 3.8   No results found for this basename: LIPASE, AMYLASE,  in the last 168 hours No results found for this basename:  AMMONIA,  in the last 168 hours CBC:  Recent Labs Lab 07/24/13 1935 07/24/13 1944  WBC 6.0  --   NEUTROABS 3.4  --   HGB 12.8 12.9  HCT 39.7 38.0  MCV 83.4  --   PLT 175  --    Cardiac Enzymes:  Recent Labs Lab 07/24/13 1935  TROPONINI <0.30    BNP (last 3 results) No results found for this basename: PROBNP,  in the last 8760 hours CBG:  Recent Labs Lab 07/24/13 [place] 90    Radiological Exams on Admission: Ct Head Wo Contrast  07/24/2013   CLINICAL DATA:  Acute onset a dysphagia 1 hr ago. History of infarct.  EXAM: CT HEAD WITHOUT CONTRAST  TECHNIQUE: Contiguous axial images were obtained from the base of the skull through the vertex without intravenous contrast.  COMPARISON:  Head CT scan 06/12/2013.  FINDINGS: Previously seen hemorrhage in the left frontal lobe has resolved. The brain is atrophic with chronic microvascular ischemic change. No evidence of acute abnormality including infarction, hemorrhage, mass lesion, mass effect, midline shift or abnormal extra-axial fluid collections identified. There is no hydrocephalus or pneumocephalus. The calvarium is intact.  IMPRESSION: No acute finding.  Atrophy and chronic microvascular ischemic change. Previously seen left frontal lobe hemorrhage has resolved.   Electronically Signed   By: Inge Rise M.D.   On: 07/24/2013 19:56    EKG: Independently reviewed. Paced rhythm.  Assessment/Plan Principal Problem:   TIA (transient ischemic attack) Active Problems:   Atrial fibrillation, chronic   Altered mental status   UTI (lower urinary tract infection)   Seizures   1. Possible TIA - presently patient is asymptomatic except for the residual speech difficulties from previous intracranial hemorrhage. I have placed patient on neuro checks and swallow evaluation. At this time the neurologist has requested patient be transferred to [facility]. Patient is in agreement with transfer. I have notified Dr. Posey Pronto,  hospitalist at [facility] with regarding to the transfer. Patient is on aspirin. MRI brain cannot be done because of pacemaker. Patient has had carotid Doppler during last admission in November. 2-D echo done during that stay had shown possible thrombus. Further recommendations per neurologist. 2. Possible UTI - I have placed patient on Levaquin orally. Patient has requested oral antibiotics. Follow urine cultures. 3. Atrial fibrillation Status post pacemaker placement - presently not on anticoagulants due to recent intracranial hemorrhage. Patient is on amiodarone and metoprolol. Closely monitor in telemetry. 4. History of seizures - patient on Keppra.  I have reviewed patient's old charts and labs.  Code Status: Full code.  Family Communication: Patient's daughter at the bedside.  Disposition Plan: Admit to inpatient.    Jeorge Reister N. [facility] Pager [phone].  If 7PM-7AM, please contact night-coverage [email or web address] Password [facility] 07/24/2013, 9:34 PM

## 2013-07-24 NOTE — ED Notes (Signed)
Dr. Leonides Schanz not transferring pt at this time. [facility] called off.Pt can be transferred later if need be.

## 2013-07-24 NOTE — ED Notes (Signed)
Daughter was walking with pt and at 6:20pm her speech became garbled and her R grip was weaker than normal. Hx of hemmorrhagic stroke on 05/18/13. Pt's speech is still garbled. R side of face has less sensation. R eye tearing.

## 2013-07-24 NOTE — ED Notes (Signed)
Report given to [facility]. 

## 2013-07-24 NOTE — ED Notes (Signed)
[facility] paged for transport.Report given to floor.

## 2013-07-24 NOTE — ED Notes (Signed)
Pt's speech is improving; she is speaking actual words now but they are not appropriate to the conversation and pt cannot seem to use the words she wants to. Dr. Leonides Schanz notified.

## 2013-07-24 NOTE — ED Provider Notes (Signed)
TIME SEEN: 7:44 PM  CHIEF COMPLAINT: Code stroke  HPI: Patient is an 80 year old female with a history of atrial fibrillation status post pacemaker, hypertension, intraparenchymal hemorrhage with intraventricular spread in 05/18/2013 who presents to the emergency department with acute onset aphasia that started at 6:30 PM. Patient's daughter provides most of the history given patient is unable to speak clearly. Patient was previously on Xarelto and after her intracranial hemorrhage she has been off of anticoagulation. She has had subsequent seizures and has been on Keppra. No seizure-like activity noted today. No recent infectious symptoms. No history of head injury.  ROS: Unable to obtain given patient's aphasia  PAST MEDICAL HISTORY/PAST SURGICAL HISTORY:  Past Medical History  Diagnosis Date  . Intracerebral hemorrhage     a. 04/2013 in setting of xarelto therapy.  . Atrial fibrillation     a. Dx in 2012;  b. Rhytm controlled - amiodarone, previously anticoagulated with xarelto (d/c'd 04/2013 in setting of [place]);  b. 04/2013 Echo: EF 55-60%, Gr 1 DD, mild MR, mod dil LA, PAsP 91mmhg.  . Hypertension   . Depression   . Arthritis   . Presence of permanent cardiac pacemaker     a. 2012 MDT, placed in [place] (probable tachy-brady).  . Pacemaker     MEDICATIONS:  Prior to Admission medications   Medication Sig Start Date End Date Taking? Authorizing Provider  ALPRAZolam Duanne Moron) 0.25 MG tablet Take 0.25 mg by mouth at bedtime as needed for anxiety.    Historical Provider, MD  amiodarone (PACERONE) 200 MG tablet Take 1 tablet (200 mg total) by mouth every morning. 06/08/13   Bary Leriche, PA-C  aspirin EC 81 MG EC tablet Take 1 tablet (81 mg total) by mouth daily. 06/08/13   Bary Leriche, PA-C  famotidine (PEPCID) 20 MG tablet Take 1 tablet (20 mg total) by mouth 2 (two) times daily. 06/08/13   Bary Leriche, PA-C  folic acid (FOLVITE) 956 MCG tablet Take 400 mcg by mouth every  evening.    Historical Provider, MD  levETIRAcetam (KEPPRA) 500 MG tablet Take 1 tablet (500 mg total) by mouth 2 (two) times daily. 06/08/13   Ivan Anchors Love, PA-C  lidocaine (LIDODERM) 5 % Place 2 patches onto the skin daily. Remove & Discard patch within 12 hours or as directed by MD 06/08/13   Ivan Anchors Love, PA-C  methocarbamol (ROBAXIN) 500 MG tablet Take 1 tablet (500 mg total) by mouth every 6 (six) hours as needed for muscle spasms. 06/08/13   Bary Leriche, PA-C  metoprolol tartrate (LOPRESSOR) 25 MG tablet Take 1 tablet (25 mg total) by mouth 2 (two) times daily. 06/08/13   Bary Leriche, PA-C  omeprazole (PRILOSEC) 20 MG capsule Take 1 capsule (20 mg total) by mouth every morning. 06/08/13   Bary Leriche, PA-C  senna (SENOKOT) 8.6 MG TABS tablet Take 2 tablets (17.2 mg total) by mouth at bedtime. 06/08/13   Bary Leriche, PA-C  sertraline (ZOLOFT) 50 MG tablet Take 1 tablet (50 mg total) by mouth at bedtime. 06/08/13   Bary Leriche, PA-C  simvastatin (ZOCOR) 10 MG tablet Take 1 tablet (10 mg total) by mouth every evening. 06/08/13   Bary Leriche, PA-C  traMADol (ULTRAM) 50 MG tablet Take 1 tablet (50 mg total) by mouth 4 (four) times daily as needed (Moderate pain). 06/08/13   Bary Leriche, PA-C  vitamin B-12 (CYANOCOBALAMIN) 1000 MCG tablet Take 1 tablet (1,000 mcg total) by  mouth every evening. 06/08/13   Bary Leriche, PA-C  Vitamin D, Ergocalciferol, (DRISDOL) 50000 UNITS CAPS capsule Take 1 capsule (50,000 Units total) by mouth every 14 (fourteen) days. 06/08/13   Bary Leriche, PA-C    ALLERGIES:  No Known Allergies  SOCIAL HISTORY:  History  Substance Use Topics  . Smoking status: Former Smoker    Quit date: 11/26/1968  . Smokeless tobacco: Not on file  . Alcohol Use: No    FAMILY HISTORY: Family History  Problem Relation Age of Onset  . Other      negative for premature CAD.    EXAM: BP 162/73  Pulse 60  Resp 22  SpO2 100% CONSTITUTIONAL: Alert but aphasic,  will follow commands intermittently, appears anxious and tearful HEAD: Normocephalic EYES: Conjunctivae clear, PERRL ENT: normal nose; no rhinorrhea; moist mucous membranes; pharynx without lesions noted NECK: Supple, no meningismus, no LAD  CARD: RRR; S1 and S2 appreciated; no murmurs, no clicks, no rubs, no gallops RESP: Normal chest excursion without splinting or tachypnea; breath sounds clear and equal bilaterally; no wheezes, no rhonchi, no rales,  ABD/GI: Normal bowel sounds; non-distended; soft, non-tender, no rebound, no guarding BACK:  The back appears normal and is non-tender to palpation, there is no CVA tenderness EXT: Normal ROM in all joints; non-tender to palpation; no edema; normal capillary refill; no cyanosis    SKIN: Normal color for age and race; warm NEURO: Moves all extremities equally, no pronator drift, unable to assess facial droop given patient is unable to follow this commands, she does not yes to having decreased sensation of the right face, patient has significant receptive aphasia with mild dysarthria PSYCH: The patient's mood and manner are appropriate. Grooming and personal hygiene are appropriate.  MEDICAL DECISION MAKING: Patient here with aphasia and dysarthria. She is also complaining of right-sided facial numbness. Her NIH stroke scale is between 3 and 4. Given her history of prior intracranial hemorrhage, she is not a TPA candidate. Discussed this with Dr. Doy Mince with neurology who agrees. She recommends full stroke workup and then admission to medicine and transfer to [facility]. Patient's blood sugar is 90.  ED PROGRESS: Patient's labs are reassuring. Her head CT shows no intracranial hemorrhage or ischemic changes. Will discuss with hospitalist for admission to [facility].  Patient symptoms are improving. She still having mild aphasia.   8:45 PM  Discussed with hospitalist for admission.    Date: 07/24/2013 19:27  Rate: 61  Rhythm: Atrial paced  rhythm  QRS Axis: normal  Intervals: normal  ST/T Wave abnormalities: normal  Conduction Disutrbances: none  Narrative Interpretation: Atrial paced rhythm, no ischemic changes, artifact present         [place], DO 07/24/13 2046

## 2013-07-25 DIAGNOSIS — R4701 Aphasia: Secondary | ICD-10-CM

## 2013-07-25 DIAGNOSIS — I4891 Unspecified atrial fibrillation: Secondary | ICD-10-CM | POA: Diagnosis not present

## 2013-07-25 DIAGNOSIS — N390 Urinary tract infection, site not specified: Secondary | ICD-10-CM

## 2013-07-25 DIAGNOSIS — G459 Transient cerebral ischemic attack, unspecified: Secondary | ICD-10-CM | POA: Diagnosis not present

## 2013-07-25 DIAGNOSIS — I10 Essential (primary) hypertension: Secondary | ICD-10-CM | POA: Diagnosis not present

## 2013-07-25 LAB — COMPREHENSIVE METABOLIC PANEL
ALK PHOS: 81 U/L (ref 39–117)
ALT: 14 U/L (ref 0–35)
AST: 21 U/L (ref 0–37)
Albumin: 3.2 g/dL — ABNORMAL LOW (ref 3.5–5.2)
BILIRUBIN TOTAL: 0.4 mg/dL (ref 0.3–1.2)
BUN: 17 mg/dL (ref 6–23)
CHLORIDE: 105 meq/L (ref 96–112)
CO2: 25 meq/L (ref 19–32)
Calcium: 8.5 mg/dL (ref 8.4–10.5)
Creatinine, Ser: 0.97 mg/dL (ref 0.50–1.10)
GFR, EST AFRICAN AMERICAN: 62 mL/min — AB (ref >90)
GFR, EST NON AFRICAN AMERICAN: 53 mL/min — AB (ref >90)
GLUCOSE: 97 mg/dL (ref 70–99)
POTASSIUM: 4 meq/L (ref 3.7–5.3)
SODIUM: 143 meq/L (ref 137–147)
Total Protein: 6.6 g/dL (ref 6.0–8.3)

## 2013-07-25 LAB — CBC
HCT: 35.5 % — ABNORMAL LOW (ref 36.0–46.0)
Hemoglobin: 11.2 g/dL — ABNORMAL LOW (ref 12.0–15.0)
MCH: 26.3 pg (ref 26.0–34.0)
MCHC: 31.5 g/dL (ref 30.0–36.0)
MCV: 83.3 fL (ref 78.0–100.0)
PLATELETS: 147 10*3/uL — AB (ref 150–400)
RBC: 4.26 MIL/uL (ref 3.87–5.11)
RDW: 14.7 % (ref 11.5–15.5)
WBC: 5.1 10*3/uL (ref 4.0–10.5)

## 2013-07-25 LAB — LIPID PANEL
CHOL/HDL RATIO: 2.8 ratio
CHOLESTEROL: 178 mg/dL (ref 0–200)
HDL: 63 mg/dL (ref >39)
LDL Cholesterol: 89 mg/dL (ref 0–99)
TRIGLYCERIDES: 132 mg/dL (ref <150)
VLDL: 26 mg/dL (ref 0–40)

## 2013-07-25 LAB — TSH: TSH: 4.468 u[IU]/mL (ref 0.350–4.500)

## 2013-07-25 LAB — HEMOGLOBIN A1C
Hgb A1c MFr Bld: 6.1 % — ABNORMAL HIGH (ref <5.7)
Mean Plasma Glucose: 128 mg/dL — ABNORMAL HIGH (ref <117)

## 2013-07-25 MED ORDER — PANTOPRAZOLE SODIUM 40 MG PO TBEC
40.0000 mg | DELAYED_RELEASE_TABLET | Freq: Every day | ORAL | Status: DC
  Administered 2013-07-25: 40 mg via ORAL
  Filled 2013-07-25: qty 1

## 2013-07-25 MED ORDER — VITAMIN D (ERGOCALCIFEROL) 1.25 MG (50000 UNIT) PO CAPS: 50000.0000 [IU] | ORAL_CAPSULE | ORAL | Status: DC

## 2013-07-25 MED ORDER — METOPROLOL TARTRATE 25 MG PO TABS
25.0000 mg | ORAL_TABLET | Freq: Two times a day (BID) | ORAL | Status: DC
  Administered 2013-07-25 (×2): 25 mg via ORAL
  Filled 2013-07-25 (×3): qty 1

## 2013-07-25 MED ORDER — VITAMIN B-12 1000 MCG PO TABS
1000.0000 ug | ORAL_TABLET | Freq: Every evening | ORAL | Status: DC
  Filled 2013-07-25: qty 1

## 2013-07-25 MED ORDER — CIPROFLOXACIN HCL 500 MG PO TABS: 500.0000 mg | ORAL_TABLET | Freq: Two times a day (BID) | ORAL | Status: AC

## 2013-07-25 MED ORDER — SENNA 8.6 MG PO TABS
2.0000 | ORAL_TABLET | Freq: Every day | ORAL | Status: DC
  Filled 2013-07-25: qty 2

## 2013-07-25 MED ORDER — LEVETIRACETAM 500 MG PO TABS
500.0000 mg | ORAL_TABLET | Freq: Two times a day (BID) | ORAL | Status: DC
  Administered 2013-07-25 (×2): 500 mg via ORAL
  Filled 2013-07-25 (×3): qty 1

## 2013-07-25 MED ORDER — SODIUM CHLORIDE 0.9 % IV BOLUS (SEPSIS): 1000.0000 mL | Freq: Once | INTRAVENOUS | Status: DC

## 2013-07-25 MED ORDER — SIMVASTATIN 10 MG PO TABS
10.0000 mg | ORAL_TABLET | Freq: Every evening | ORAL | Status: DC
  Filled 2013-07-25: qty 1

## 2013-07-25 MED ORDER — TRAMADOL HCL 50 MG PO TABS: 50.0000 mg | ORAL_TABLET | Freq: Four times a day (QID) | ORAL | Status: DC | PRN

## 2013-07-25 MED ORDER — SERTRALINE HCL 50 MG PO TABS
50.0000 mg | ORAL_TABLET | Freq: Every day | ORAL | Status: DC
  Administered 2013-07-25: 50 mg via ORAL
  Filled 2013-07-25 (×2): qty 1

## 2013-07-25 MED ORDER — DICLOFENAC EPOLAMINE 1.3 % TD PTCH
1.0000 | MEDICATED_PATCH | Freq: Two times a day (BID) | TRANSDERMAL | Status: DC
  Administered 2013-07-25: 1 via TRANSDERMAL
  Filled 2013-07-25 (×3): qty 1

## 2013-07-25 MED ORDER — FOLIC ACID 0.5 MG HALF TAB
1.0000 mg | ORAL_TABLET | Freq: Every evening | ORAL | Status: DC
  Filled 2013-07-25: qty 2

## 2013-07-25 MED ORDER — FAMOTIDINE 20 MG PO TABS
20.0000 mg | ORAL_TABLET | Freq: Two times a day (BID) | ORAL | Status: DC
  Administered 2013-07-25: 20 mg via ORAL
  Filled 2013-07-25 (×3): qty 1

## 2013-07-25 MED ORDER — AMIODARONE HCL 200 MG PO TABS
200.0000 mg | ORAL_TABLET | Freq: Every morning | ORAL | Status: DC
  Administered 2013-07-25: 200 mg via ORAL
  Filled 2013-07-25: qty 1

## 2013-07-25 MED ORDER — ACETAMINOPHEN 325 MG PO TABS
650.0000 mg | ORAL_TABLET | ORAL | Status: DC | PRN
  Administered 2013-07-25: 650 mg via ORAL
  Filled 2013-07-25: qty 2

## 2013-07-25 MED ORDER — CIPROFLOXACIN HCL 500 MG PO TABS
500.0000 mg | ORAL_TABLET | Freq: Two times a day (BID) | ORAL | Status: DC
  Administered 2013-07-25: 500 mg via ORAL
  Filled 2013-07-25 (×3): qty 1

## 2013-07-25 MED ORDER — LEVOFLOXACIN 250 MG PO TABS
250.0000 mg | ORAL_TABLET | Freq: Every day | ORAL | Status: DC
  Administered 2013-07-25: 250 mg via ORAL
  Filled 2013-07-25 (×2): qty 1

## 2013-07-25 MED ORDER — SODIUM CHLORIDE 0.9 % IV SOLN
INTRAVENOUS | Status: DC
Start: 2013-07-25 — End: 2013-07-25
  Administered 2013-07-25 (×3): via INTRAVENOUS

## 2013-07-25 MED ORDER — ASPIRIN EC 81 MG PO TBEC
81.0000 mg | DELAYED_RELEASE_TABLET | Freq: Every day | ORAL | Status: DC
  Administered 2013-07-25: 81 mg via ORAL
  Filled 2013-07-25: qty 1

## 2013-07-25 MED ORDER — METHOCARBAMOL 500 MG PO TABS: 500.0000 mg | ORAL_TABLET | Freq: Four times a day (QID) | ORAL | Status: DC | PRN

## 2013-07-25 MED ORDER — ALPRAZOLAM 0.25 MG PO TABS: 0.2500 mg | ORAL_TABLET | Freq: Every evening | ORAL | Status: DC | PRN

## 2013-07-25 NOTE — Telephone Encounter (Signed)
Letter faxed to [facility] [phone]. Notified Santiago Glad by [employer] letter was sent.

## 2013-07-25 NOTE — Progress Notes (Signed)
Discharge orders received, pt for discharge home today,  IV D/C,  D/C instructions and Rx given with verbalized understanding.  Family at bedside to assist pt with discharge. Staff brought pt downstairs via wheelchair.  

## 2013-07-25 NOTE — Progress Notes (Signed)
Stroke Team Progress Note  HISTORY Ann Kaufman is an 80 y.o. female with a history of a [place] on 05/18/2013 while on Xarelto for atrial fibrillation who was with her daughter today and while the two were walking her daughter noted neurological changes. Patient has a mild aphasia at baseline. Acutely though the patient became unable to speak and was having extensive difficulty getting her words out. Was also noted to hahve right upper extremity weakness as well. Patient remained ambulatory . Was brought to [facility] where a code stroke was called. Daughter reports that the patient's symptoms lasted about an hour and then resolved completely. She feels the patient is now back to baseline.  Date last known well: Date: 07/24/2013  Time last known well: Time: 18:20  tPA Given: No: Resolution of symptoms She was admitted for further evaluation and treatment.  SUBJECTIVE Her daughter is at the bedside.  Overall she feels her condition is stable.   OBJECTIVE Most recent Vital Signs: Filed Vitals:   07/25/13 0800 07/25/13 0956 07/25/13 1000 07/25/13 1200  BP: 134/78 130/62 116/60 133/53  Pulse: 62 64 74 62  Temp: 98.2 F (36.8 C)  97.9 F (36.6 C) 97.7 F (36.5 C)  TempSrc: Oral  Oral Oral  Resp: 20  20 20   Height:      Weight:      SpO2: 94%  99% 98%   CBG (last 3)   Recent Labs  07/24/13 1957  GLUCAP 90    IV Fluid Intake:   . sodium chloride 20 mL/hr at 07/25/13 1004    MEDICATIONS  . amiodarone  200 mg Oral q morning - 10a  . aspirin EC  81 mg Oral Daily  . ciprofloxacin  500 mg Oral BID  . diclofenac  1 patch Transdermal BID  . famotidine  20 mg Oral BID  . folic acid  1 mg Oral QPM  . levETIRAcetam  500 mg Oral BID  . metoprolol tartrate  25 mg Oral BID  . pantoprazole  40 mg Oral Daily  . senna  2 tablet Oral QHS  . sertraline  50 mg Oral QHS  . simvastatin  10 mg Oral QPM  . sodium chloride  1,000 mL Intravenous Once  . vitamin B-12  1,000 mcg Oral QPM  . [START ON  07/29/2013] Vitamin D (Ergocalciferol)  50,000 Units Oral Q14 Days   PRN:  acetaminophen, ALPRAZolam, methocarbamol, traMADol  Diet:  Cardiac thin liquids Activity:   Bathroom privileges with assistance DVT Prophylaxis:  SCDs   CLINICALLY SIGNIFICANT STUDIES Basic Metabolic Panel:  Recent Labs Lab 07/24/13 1935 07/24/13 1944 07/25/13 0635  NA 141 142 143  K 3.9 3.6* 4.0  CL 102 102 105  CO2 27  --  25  GLUCOSE 109* 102* 97  BUN 19 21 17   CREATININE 0.98 1.10 0.97  CALCIUM 9.5  --  8.5   Liver Function Tests:  Recent Labs Lab 07/24/13 1935 07/25/13 0635  AST 26 21  ALT 17 14  ALKPHOS 96 81  BILITOT 0.4 0.4  PROT 7.7 6.6  ALBUMIN 3.8 3.2*   CBC:  Recent Labs Lab 07/24/13 1935 07/24/13 1944 07/25/13 0635  WBC 6.0  --  5.1  NEUTROABS 3.4  --   --   HGB 12.8 12.9 11.2*  HCT 39.7 38.0 35.5*  MCV 83.4  --  83.3  PLT 175  --  147*   Coagulation:  Recent Labs Lab 07/24/13 1935  LABPROT 13.2  INR 1.02  Cardiac Enzymes:  Recent Labs Lab 07/24/13 1935  TROPONINI <0.30   Urinalysis:  Recent Labs Lab 07/24/13 2017  COLORURINE YELLOW  LABSPEC 1.023  PHURINE 6.0  GLUCOSEU NEGATIVE  HGBUR SMALL*  BILIRUBINUR NEGATIVE  KETONESUR NEGATIVE  PROTEINUR NEGATIVE  UROBILINOGEN 1.0  NITRITE POSITIVE*  LEUKOCYTESUR MODERATE*   Lipid Panel    Component Value Date/Time   CHOL 178 07/25/2013 0635   TRIG 132 07/25/2013 0635   HDL 63 07/25/2013 0635   CHOLHDL 2.8 07/25/2013 0635   VLDL 26 07/25/2013 0635   LDLCALC 89 07/25/2013 0635   HgbA1C  Lab Results  Component Value Date   HGBA1C 5.9* 05/19/2013    Urine Drug Screen:     Component Value Date/Time   LABOPIA NONE DETECTED 07/24/2013 2017   COCAINSCRNUR NONE DETECTED 07/24/2013 2017   LABBENZ NONE DETECTED 07/24/2013 2017   AMPHETMU NONE DETECTED 07/24/2013 2017   THCU NONE DETECTED 07/24/2013 2017   LABBARB NONE DETECTED 07/24/2013 2017    Alcohol Level:  Recent Labs Lab 07/24/13 1935  ETH <11    CT of the  brain  07/24/2013    No acute finding.  Atrophy and chronic microvascular ischemic change. Previously seen left frontal lobe hemorrhage has resolved.   2D Echocardiogram    EKG  Atrial paced ryhthm. For complete results please see formal report.   Therapy Recommendations   Physical Exam   Pleasant elderly caucasian lady not in distress.Awake alert. Afebrile. Head is nontraumatic. Neck is supple without bruit. Hearing is normal. Cardiac exam no murmur or gallop. Lungs are clear to auscultation. Distal pulses are well felt. Neurological Exam ;  Awake  Alert oriented x 3. Normal speech and language.eye movements full without nystagmus.fundi were not visualized. Vision acuity and fields appear normal. Hearing is normal. Palatal movements are normal. Face symmetric. Tongue midline. Normal strength, tone, reflexes and coordination. Normal sensation. Gait deferred. ASSESSMENT Ann Kaufman is a 80 y.o. female presenting with aphasia and RUE hemiparesis that resolved. Left brain infarct vs TIA. Imaging confirmation not possible due to pacemaker. Regardless, it would not change treatment. Infarct or TIA felt to be embolic secondary to known atrial fibrillation, not on anticoagulation. Recurrent slurred speech and global aphasia overnight - improved after laying flat and fluid bolus. On aspirin 81 mg orally every day prior to admission. Now on aspirin 81 mg orally every day for secondary stroke prevention.   atrial fibrillation, unable to take anticoagulation due to spontaneous ICH Nov 2014 on xarelto Hypertension Hyperlipidemia, LDL 89, on zocor 10 mg daily PTA, now on zocor 10 mg daily, at goal LDL < 100  Pacemaker  Hospital day # 1  TREATMENT/PLAN  Continue aspirin 81 mg orally every day for secondary stroke prevention. Not an anticoagulation candidate due to hx ICH.  No further stroke workup indicated. Further workup will not change current treatment.   Tool for discharge back to previous  setting from neurology standpoint.  No further stroke workup indicated.  Patient has a 10-15% risk of having another stroke over the next year, the highest risk is within 2 weeks of the most recent stroke/TIA (risk of having a stroke following a stroke or TIA is the same).  Ongoing risk factor control by Primary Care Physician  Stroke Service will sign off. Please call should any needs arise.  Follow up with Dr. Leonie Man, [facility], at already scheduled appt in March  Burnetta Sabin, MSN, RN, ANVP-BC, ANP-BC, GNP-BC [facility] Pager:  [phone] 07/25/2013 12:12 PM  I have personally obtained a history, examined the patient, evaluated imaging results, and formulated the assessment and plan of care. I agree with the above. Antony Contras, MD

## 2013-07-25 NOTE — Consult Note (Signed)
Referring Physician: Hal Hope    Chief Complaint: RUE weakness, difficulty with speech  HPI: Ann Kaufman is an 80 y.o. female with a history of a [place] on 05/18/2013 while on Xarelto for atrial fibrillation who was with her daughter today and while the two were walking her daughter noted neurological changes.  Patient has a mild aphasia at baseline.  Acutely though the patient became unable to speak and was having extensive difficulty getting her words out. Was also noted to hahve right upper extremity weakness as well.  Patient remained ambulatory .  Was brought to [facility] where a code stroke was called.  Daughter reports that the patient's symptoms lasted about an hour and then resolved completely.  She feels the patient is now back to baseline.   Date last known well: Date: 07/24/2013 Time last known well: Time: 18:20 tPA Given: No: Resolution of symptoms  Past Medical History  Diagnosis Date  . Intracerebral hemorrhage     a. 04/2013 in setting of xarelto therapy.  . Atrial fibrillation     a. Dx in 2012;  b. Rhytm controlled - amiodarone, previously anticoagulated with xarelto (d/c'd 04/2013 in setting of [place]);  b. 04/2013 Echo: EF 55-60%, Gr 1 DD, mild MR, mod dil LA, PAsP 40mmhg.  . Hypertension   . Depression   . Arthritis   . Presence of permanent cardiac pacemaker     a. 2012 MDT, placed in [place] (probable tachy-brady).  . Pacemaker     Past Surgical History  Procedure Laterality Date  . Pacemaker insertion    . Abdominal hysterectomy    . Breast lumpectomy    . Appendectomy    . Bowel resection      Family History  Problem Relation Age of Onset  . Other      negative for premature CAD.   Social History:  reports that she quit smoking about 44 years ago. She does not have any smokeless tobacco history on file. She reports that she does not drink alcohol or use illicit drugs.  Allergies: No Known Allergies  Medications:  I have reviewed the patient's current  medications. Prior to Admission:  Prescriptions prior to admission  Medication Sig Dispense Refill  . amiodarone (PACERONE) 200 MG tablet Take 1 tablet (200 mg total) by mouth every morning.  30 tablet  1  . aspirin EC 81 MG EC tablet Take 1 tablet (81 mg total) by mouth daily.      . diclofenac (FLECTOR) 1.3 % PTCH Place 1 patch onto the skin 2 (two) times daily.      . famotidine (PEPCID) 20 MG tablet Take 1 tablet (20 mg total) by mouth 2 (two) times daily.      . folic acid (FOLVITE) A999333 MCG tablet Take 400 mcg by mouth every evening.      . levETIRAcetam (KEPPRA) 500 MG tablet Take 1 tablet (500 mg total) by mouth 2 (two) times daily.  60 tablet  1  . metoprolol tartrate (LOPRESSOR) 25 MG tablet Take 1 tablet (25 mg total) by mouth 2 (two) times daily.  60 tablet  1  . omeprazole (PRILOSEC) 20 MG capsule Take 1 capsule (20 mg total) by mouth every morning.  30 capsule  1  . PRESCRIPTION MEDICATION ABR 07-17-08 (ativan,benadryl, reglan)  apply to inner wrist every 4 hours as needed for anxiety.      . senna (SENOKOT) 8.6 MG TABS tablet Take 2 tablets (17.2 mg total) by mouth at bedtime.  120 each  0  . sertraline (ZOLOFT) 50 MG tablet Take 1 tablet (50 mg total) by mouth at bedtime.  30 tablet  1  . simvastatin (ZOCOR) 10 MG tablet Take 1 tablet (10 mg total) by mouth every evening.  30 tablet  1  . vitamin B-12 (CYANOCOBALAMIN) 1000 MCG tablet Take 1 tablet (1,000 mcg total) by mouth every evening.  30 tablet  1  . ALPRAZolam (XANAX) 0.25 MG tablet Take 0.25 mg by mouth at bedtime as needed for anxiety.      . methocarbamol (ROBAXIN) 500 MG tablet Take 1 tablet (500 mg total) by mouth every 6 (six) hours as needed for muscle spasms.      . traMADol (ULTRAM) 50 MG tablet Take 1 tablet (50 mg total) by mouth 4 (four) times daily as needed (Moderate pain).  30 tablet  0  . Vitamin D, Ergocalciferol, (DRISDOL) 50000 UNITS CAPS capsule Take 1 capsule (50,000 Units total) by mouth every 14  (fourteen) days.  4 capsule  1   Scheduled: . amiodarone  200 mg Oral q morning - 10a  . aspirin EC  81 mg Oral Daily  . diclofenac  1 patch Transdermal BID  . famotidine  20 mg Oral BID  . folic acid  1 mg Oral QPM  . levETIRAcetam  500 mg Oral BID  . levofloxacin  250 mg Oral QHS  . metoprolol tartrate  25 mg Oral BID  . pantoprazole  40 mg Oral Daily  . senna  2 tablet Oral QHS  . sertraline  50 mg Oral QHS  . simvastatin  10 mg Oral QPM  . vitamin B-12  1,000 mcg Oral QPM  . [START ON 07/29/2013] Vitamin D (Ergocalciferol)  50,000 Units Oral Q14 Days    ROS: History obtained from the patient  General ROS: negative for - chills, fatigue, fever, night sweats, weight gain or weight loss Psychological ROS: negative for - behavioral disorder, hallucinations, memory difficulties, mood swings or suicidal ideation Ophthalmic ROS: negative for - blurry vision, double vision, eye pain or loss of vision ENT ROS: negative for - epistaxis, nasal discharge, oral lesions, sore throat, tinnitus or vertigo Allergy and Immunology ROS: negative for - hives or itchy/watery eyes Hematological and Lymphatic ROS: negative for - bleeding problems, bruising or swollen lymph nodes Endocrine ROS: negative for - galactorrhea, hair pattern changes, polydipsia/polyuria or temperature intolerance Respiratory ROS: negative for - cough, hemoptysis, shortness of breath or wheezing Cardiovascular ROS: negative for - chest pain, dyspnea on exertion, edema or irregular heartbeat Gastrointestinal ROS: negative for - abdominal pain, diarrhea, hematemesis, nausea/vomiting or stool incontinence Genito-Urinary ROS: negative for - dysuria, hematuria, incontinence or urinary frequency/urgency Musculoskeletal ROS: negative for - joint swelling or muscular weakness Neurological ROS: as noted in HPI Dermatological ROS: negative for rash and skin lesion changes  Physical Examination: Blood pressure 160/74, pulse 60,  temperature 97.8 F (36.6 C), temperature source Oral, resp. rate 18, height 5\' 3"  (1.6 m), weight 70.353 kg (155 lb 1.6 oz), SpO2 100.00%.  Neurologic Examination: Mental Status: Alert, oriented, thought content appropriate.  Word finding difficulties.  Able to follow 3 step commands without difficulty. Cranial Nerves: II: Discs flat bilaterally; Visual fields grossly normal, pupils equal, round, reactive to light and accommodation III,IV, VI: ptosis not present, extra-ocular motions intact bilaterally V,VII: right facial droop, facial light touch sensation normal bilaterally VIII: hearing normal bilaterally IX,X: gag reflex present XI: bilateral shoulder shrug XII: midline tongue extension Motor: Right :  Upper extremity   5/5 with pronator drift    Left:     Upper extremity   5/5  Lower extremity   5/5        Lower extremity   5/5 Tone and bulk:normal tone throughout; no atrophy noted Sensory: Pinprick and light touch intact throughout, bilaterally Deep Tendon Reflexes: 2+ and symmetric throughout with absent AJ's bilaterally Plantars: Right: downgoing   Left: downgoing Cerebellar: normal finger-to-nose and normal heel-to-shin test Gait: Unable to test CV: pulses palpable throughout     Laboratory Studies:  Basic Metabolic Panel:  Recent Labs Lab 07/24/13 1935 07/24/13 1944  NA 141 142  K 3.9 3.6*  CL 102 102  CO2 27  --   GLUCOSE 109* 102*  BUN 19 21  CREATININE 0.98 1.10  CALCIUM 9.5  --     Liver Function Tests:  Recent Labs Lab 07/24/13 1935  AST 26  ALT 17  ALKPHOS 96  BILITOT 0.4  PROT 7.7  ALBUMIN 3.8   No results found for this basename: LIPASE, AMYLASE,  in the last 168 hours No results found for this basename: AMMONIA,  in the last 168 hours  CBC:  Recent Labs Lab 07/24/13 1935 07/24/13 1944  WBC 6.0  --   NEUTROABS 3.4  --   HGB 12.8 12.9  HCT 39.7 38.0  MCV 83.4  --   PLT 175  --     Cardiac Enzymes:  Recent Labs Lab  07/24/13 1935  TROPONINI <0.30    BNP: No components found with this basename: POCBNP,   CBG:  Recent Labs Lab 07/24/13 1957  GLUCAP 31    Microbiology: Results for orders placed during the hospital encounter of 05/24/13  URINE CULTURE     Status: None   Collection Time    05/25/13  8:37 AM      Result Value Range Status   Specimen Description URINE, CATHETERIZED   Final   Special Requests NONE   Final   Culture  Setup Time     Final   Value: 05/25/2013 08:27     Performed at [place]     Final   Value: NO GROWTH     Performed at [facility]   Culture     Final   Value: NO GROWTH     Performed at [facility]   Report Status 05/26/2013 FINAL   Final  URINE CULTURE     Status: None   Collection Time    06/06/13  6:57 PM      Result Value Range Status   Specimen Description URINE, CLEAN CATCH   Final   Special Requests NONE   Final   Culture  Setup Time     Final   Value: 06/07/2013 03:13     Performed at [place]     Final   Value: NO GROWTH     Performed at [facility]   Culture     Final   Value: NO GROWTH     Performed at [facility]   Report Status 06/08/2013 FINAL   Final    Coagulation Studies:  Recent Labs  07/24/13 1935  LABPROT 13.2  INR 1.02    Urinalysis:  Recent Labs Lab 07/24/13 2017  COLORURINE YELLOW  LABSPEC 1.023  PHURINE 6.0  GLUCOSEU NEGATIVE  HGBUR SMALL*  BILIRUBINUR NEGATIVE  KETONESUR NEGATIVE  PROTEINUR NEGATIVE  UROBILINOGEN 1.0  NITRITE POSITIVE*  LEUKOCYTESUR MODERATE*    Lipid Panel: No results found for this basename: chol, trig, hdl, cholhdl, vldl, ldlcalc    HgbA1C:  Lab Results  Component Value Date   HGBA1C 5.9* 05/19/2013    Urine Drug Screen:     Component Value Date/Time   LABOPIA NONE DETECTED 07/24/2013 2017   COCAINSCRNUR NONE DETECTED 07/24/2013 2017   LABBENZ NONE DETECTED 07/24/2013 2017   AMPHETMU NONE  DETECTED 07/24/2013 2017   THCU NONE DETECTED 07/24/2013 2017   LABBARB NONE DETECTED 07/24/2013 2017    Alcohol Level:  Recent Labs Lab 07/24/13 1935  ETH <11    Other results: EKG: paced rhythm.  Imaging: Ct Head Wo Contrast  07/24/2013   CLINICAL DATA:  Acute onset a dysphagia 1 hr ago. History of infarct.  EXAM: CT HEAD WITHOUT CONTRAST  TECHNIQUE: Contiguous axial images were obtained from the base of the skull through the vertex without intravenous contrast.  COMPARISON:  Head CT scan 06/12/2013.  FINDINGS: Previously seen hemorrhage in the left frontal lobe has resolved. The brain is atrophic with chronic microvascular ischemic change. No evidence of acute abnormality including infarction, hemorrhage, mass lesion, mass effect, midline shift or abnormal extra-axial fluid collections identified. There is no hydrocephalus or pneumocephalus. The calvarium is intact.  IMPRESSION: No acute finding.  Atrophy and chronic microvascular ischemic change. Previously seen left frontal lobe hemorrhage has resolved.   Electronically Signed   By: Inge Rise M.D.   On: 07/24/2013 19:56    Assessment: 80 y.o. female presenting with complaints of aphasia and right upper extremity weakness.  Suspect TIA.  Patient on ASA at home.  Has a history of a ICH on Xarelto in the past.  Head CT reviewed and shows no acute changes.  MRI of the brain unable to be performed secondary to a pacer.   Carotid doppler in November of 2014 shows no hemodynamically significant stenosis.  Echocardiogram showed a possible thrombus.    Stroke Risk Factors - atrial fibrillation and hypertension  Plan: 1. HgbA1c, fasting lipid panel 2. PT consult, OT consult, Speech consult 3. Echocardiogram 4. Prophylactic therapy-Continue ASA 5. Telemetry monitoring 6. Frequent neuro checks   Alexis Goodell, MD [facility] [phone] 07/25/2013, 1:22 AM

## 2013-07-25 NOTE — Progress Notes (Signed)
80yo female c/o garbled speech w/ recent h/o hemorrhagic stroke, speech improved some in ED but remained inappropriate, admitted for further w/u, UA abnormal, to begin ABX.  Will start Levaquin 250mg  po Q24h for CrCl ~35 ml/min and monitor CBC and Cx.  Wynona Neat, PharmD, BCPS  07/25/2013 12:22 AM

## 2013-07-25 NOTE — Progress Notes (Signed)
Late Entry: Around 0400, pt having difficulty speaking, slurred speech and slow to respond. Unable to follow commands and responses to questions are inappropriate. Pt had equal grips and good strength in legs. Pt's BP was taking while pt was still sitting up in bed. BP 109/59, oxygen 96% on RA. Pt was placed flat and fluids started. Kathline Magic, NP was paged around [phone]. Dr. Doy Mince was paged as well. After 15-20 minutes of lying flat, pt began to answer questions more clearly and appeared to back to baseline. Repeat BP was 133/56 while pt was lying flat. Dr. Doy Mince ordered to keep pt flat at that time and give 1 liter bolus of normal saline solution. Pt has remained at baseline through out night. Madelaine Bhat 07/25/13 0750

## 2013-07-25 NOTE — Telephone Encounter (Signed)
Contacted Ann Kaufman to see if Patient wants Outpatient therapy? Ann Kaufman will call us back to let us know what the letter needs to say for patient's speech therapy.

## 2013-07-25 NOTE — Progress Notes (Signed)
[facility] progress note. Chief complaint. Transfer note. History of present illness. This 80 year old female with recent history of hemorrhagic stroke presented to [facility] emergency room with speech changes/garbled speech. She was felt to require a TIA/stroke workup and was transferred for that purpose to [facility]. The patient has now arrived to [facility] and I am seeing the patient at bedside to ensure she remains stable post transfer and all orders have transferred appropriately. Patient has no complaints currently. Vital signs temperature 97.8, pulse 60, respiration 18, blood pressure 160/74. O2 sats 100%. General appearance. Well-developed elderly female who is alert, cooperative, in no distress. Cardiac. Rate and rhythm primarily regular with occasional irregular beat. Lungs. Breath sounds clear and equal. Abdomen. Soft with positive bowel sounds. No pain. Neurologic. The patient's speech remains mildly dysarthric. Strength equal 5/5 in all 4 extremities. Grip strength strong and equal bilaterally. Impression/plan. Problem #1. Rule out TIA/CVA. Patient will be admitted for stroke workup. Patient appears clinically stable at this time. Problem #2. UTI. Patient is on appropriate antibiotics with Levaquin. All orders appear to have transferred appropriately.

## 2013-07-25 NOTE — Discharge Summary (Signed)
Physician Discharge Summary  RAYYA YAGI MCN:[id number] DOB: 17-Jul-1929 DOA: 07/24/2013  PCP: Margarita Rana, MD  Admit date: 07/24/2013 Discharge date: 07/25/2013  Time spent: 65 minutes  Recommendations for Outpatient Follow-up:  1. Followup with Dr. Leonie Man of neurology as previously scheduled in March. 2. Followup with MALONEY,NANCY, MD in 1 week.   Discharge Diagnoses:  Principal Problem:   TIA (transient ischemic attack) Active Problems:   Atrial fibrillation, chronic   Altered mental status   UTI (lower urinary tract infection)   Seizures   Discharge Condition: Stable and improved  Diet recommendation: Heart healthy  Filed Weights   07/24/13 2002 07/25/13 0008  Weight: 74.844 kg (165 lb) 70.353 kg (155 lb 1.6 oz)    History of present illness:  Ann Kaufman is a 80 y.o. female with history of intracranial hemorrhage in November 2014 while being on xarelto for atrial fibrillation was found to have difficulty speaking and right upper extremity weakness around 6:30 PM. Patient was brought to the ER by patient's daughter. By then patient's symptoms have resolved. Patient's symptoms resolved by half hour. CT head did not show anything acute. On-call neurologist, Dr. Doy Mince was consulted by ER physician. Since patient's symptoms are resolved and has had infected or bleed was not a candidate for TPA. Patient has paced difficulties from her previous intracranial hemorrhage. Patient otherwise denies any visual symptoms difficulty swallowing. Has been having some headache off and on the left temporal area. Denies any weakness of the other extremities. Denies nausea vomiting abdominal pain diarrhea.      Hospital Course:  #1 TIA versus left brain infarct Patient was admitted with aphasia right upper extremity hemiparesis that resolved after an hour and as such  due to resolution of symptoms, and prior history of ICH TPA was not given. CT of the head which was done was negative. MRI  of the head was unable to be performed secondary to pacemaker. Patient was seen in consultation by neurology who recommended observation and further evaluation with frequent neuro checks, telemetry monitoring and aspirin for secondary stroke prevention. Patient had carotid Dopplers done in November of 2014 which showed no hemodynamic significant stenosis and a such was not repeated. 2-D echo which was done in November 2014 showed a possible thrombus. Patient did have a history of atrial fibrillation and a such it was felt may have had an embolic event secondary to known atrial fibrillation not on anticoagulation. Patient was not anticoagulation candidate secondary to history of ICH. It was recommended that no further stroke workup was indicated as that would not change current management. She was maintained on aspirin for secondary stroke prevention was continued on her home regimen of Zocor and was discharged in stable and improved condition and back to baseline by day of discharge. Patient will followup with neurology as outpatient.  #2 hyperlipidemia LDL obtained came back at 89. Patient was on Zocor prior to admission and was maintained on this.  #3 hypertension Remained stable throughout the hospitalization.  Procedures:  CT head 07/24/2013    Consultations:  Neurology: Dr. Doy Mince 07/25/2013  Discharge Exam: Filed Vitals:   07/25/13 1400  BP: 109/47  Pulse: 60  Temp: 97.8 F (36.6 C)  Resp: 16    General: nad Cardiovascular: rrr Respiratory: ctab  Discharge Instructions  Discharge Orders   Future Appointments Provider Department Dept Phone   08/25/2013 1:00 PM Philmore Pali, NP [facility] [phone]   09/12/2013 10:00 AM Meredith Staggers, MD [facility] [phone]   Future Orders Complete By Expires   Diet - low sodium heart healthy  As directed    Discharge instructions  As directed    Comments:     Follow up  with Dr Leonie Man in 2 months. Follow up with MALONEY,NANCY, MD in 1 week.   Increase activity slowly  As directed        Medication List         ALPRAZolam 0.25 MG tablet  Commonly known as:  XANAX  Take 0.25 mg by mouth at bedtime as needed for anxiety.     amiodarone 200 MG tablet  Commonly known as:  PACERONE  Take 1 tablet (200 mg total) by mouth every morning.     aspirin 81 MG EC tablet  Take 1 tablet (81 mg total) by mouth daily.     ciprofloxacin 500 MG tablet  Commonly known as:  CIPRO  Take 1 tablet (500 mg total) by mouth 2 (two) times daily. Take for 1 week then stop.     diclofenac 1.3 % Ptch  Commonly known as:  FLECTOR  Place 1 patch onto the skin 2 (two) times daily.     famotidine 20 MG tablet  Commonly known as:  PEPCID  Take 1 tablet (20 mg total) by mouth 2 (two) times daily.     folic acid A999333 MCG tablet  Commonly known as:  FOLVITE  Take 400 mcg by mouth every evening.     levETIRAcetam 500 MG tablet  Commonly known as:  KEPPRA  Take 1 tablet (500 mg total) by mouth 2 (two) times daily.     methocarbamol 500 MG tablet  Commonly known as:  ROBAXIN  Take 1 tablet (500 mg total) by mouth every 6 (six) hours as needed for muscle spasms.     metoprolol tartrate 25 MG tablet  Commonly known as:  LOPRESSOR  Take 1 tablet (25 mg total) by mouth 2 (two) times daily.     omeprazole 20 MG capsule  Commonly known as:  PRILOSEC  Take 1 capsule (20 mg total) by mouth every morning.     PRESCRIPTION MEDICATION  - ABR 07-17-08 (ativan,benadryl, reglan)  -   - apply to inner wrist every 4 hours as needed for anxiety.     senna 8.6 MG Tabs tablet  Commonly known as:  SENOKOT  Take 2 tablets (17.2 mg total) by mouth at bedtime.     sertraline 50 MG tablet  Commonly known as:  ZOLOFT  Take 1 tablet (50 mg total) by mouth at bedtime.     simvastatin 10 MG tablet  Commonly known as:  ZOCOR  Take 1 tablet (10 mg total) by mouth every evening.      traMADol 50 MG tablet  Commonly known as:  ULTRAM  Take 1 tablet (50 mg total) by mouth 4 (four) times daily as needed (Moderate pain).     vitamin B-12 1000 MCG tablet  Commonly known as:  CYANOCOBALAMIN  Take 1 tablet (1,000 mcg total) by mouth every evening.     Vitamin D (Ergocalciferol) 50000 UNITS Caps capsule  Commonly known as:  DRISDOL  Take 1 capsule (50,000 Units total) by mouth every 14 (fourteen) days.       No Known Allergies     Follow-up Information   Follow up with MALONEY,NANCY, MD. Schedule an appointment as soon as possible for a visit in 1 week.   Specialty:  Family Medicine  Contact information:   [address] [place] [address] [phone]       Follow up with Forbes Cellar, MD. Schedule an appointment as soon as possible for a visit in 2 months.   Specialties:  Neurology, Radiology   Contact information:   [address] [place] [address] [phone]        The results of significant diagnostics from this hospitalization (including imaging, microbiology, ancillary and laboratory) are listed below for reference.    Significant Diagnostic Studies: Ct Head Wo Contrast  07/24/2013   CLINICAL DATA:  Acute onset a dysphagia 1 hr ago. History of infarct.  EXAM: CT HEAD WITHOUT CONTRAST  TECHNIQUE: Contiguous axial images were obtained from the base of the skull through the vertex without intravenous contrast.  COMPARISON:  Head CT scan 06/12/2013.  FINDINGS: Previously seen hemorrhage in the left frontal lobe has resolved. The brain is atrophic with chronic microvascular ischemic change. No evidence of acute abnormality including infarction, hemorrhage, mass lesion, mass effect, midline shift or abnormal extra-axial fluid collections identified. There is no hydrocephalus or pneumocephalus. The calvarium is intact.  IMPRESSION: No acute finding.  Atrophy and chronic microvascular ischemic change. Previously seen left  frontal lobe hemorrhage has resolved.   Electronically Signed   By: Inge Rise M.D.   On: 07/24/2013 19:56    Microbiology: No results found for this or any previous visit (from the past 240 hour(s)).   Labs: Basic Metabolic Panel:  Recent Labs Lab 07/24/13 1935 07/24/13 1944 07/25/13 0635  NA 141 142 143  K 3.9 3.6* 4.0  CL 102 102 105  CO2 27  --  25  GLUCOSE 109* 102* 97  BUN 19 21 17   CREATININE 0.98 1.10 0.97  CALCIUM 9.5  --  8.5   Liver Function Tests:  Recent Labs Lab 07/24/13 1935 07/25/13 0635  AST 26 21  ALT 17 14  ALKPHOS 96 81  BILITOT 0.4 0.4  PROT 7.7 6.6  ALBUMIN 3.8 3.2*   No results found for this basename: LIPASE, AMYLASE,  in the last 168 hours No results found for this basename: AMMONIA,  in the last 168 hours CBC:  Recent Labs Lab 07/24/13 1935 07/24/13 1944 07/25/13 0635  WBC 6.0  --  5.1  NEUTROABS 3.4  --   --   HGB 12.8 12.9 11.2*  HCT 39.7 38.0 35.5*  MCV 83.4  --  83.3  PLT 175  --  147*   Cardiac Enzymes:  Recent Labs Lab 07/24/13 1935  TROPONINI <0.30   BNP: BNP (last 3 results) No results found for this basename: PROBNP,  in the last 8760 hours CBG:  Recent Labs Lab 07/24/13 1957  GLUCAP 90       Signed:  Jamirah Zelaya M.D. [facility] 07/25/2013, 5:30 PM

## 2013-07-26 ENCOUNTER — Telehealth: Payer: Self-pay

## 2013-07-26 DIAGNOSIS — I619 Nontraumatic intracerebral hemorrhage, unspecified: Secondary | ICD-10-CM

## 2013-07-26 LAB — URINE CULTURE: Colony Count: >=100000

## 2013-07-26 NOTE — Progress Notes (Signed)
UR complete.  Sabena Winner RN, MSN 

## 2013-07-26 NOTE — Telephone Encounter (Signed)
Santiago Glad returned call regarding patient.

## 2013-07-27 DIAGNOSIS — R279 Unspecified lack of coordination: Secondary | ICD-10-CM | POA: Diagnosis not present

## 2013-07-27 DIAGNOSIS — I619 Nontraumatic intracerebral hemorrhage, unspecified: Secondary | ICD-10-CM | POA: Diagnosis not present

## 2013-07-27 DIAGNOSIS — M6281 Muscle weakness (generalized): Secondary | ICD-10-CM | POA: Diagnosis not present

## 2013-07-27 DIAGNOSIS — R262 Difficulty in walking, not elsewhere classified: Secondary | ICD-10-CM | POA: Diagnosis not present

## 2013-07-27 NOTE — Telephone Encounter (Signed)
I spoke with Ann Kaufman and she says that they are going to be transitioning her to [place] at the end of the month but that they would like her to have speech therapy at [facility] outpt.  There were previous messages about this and you(Swartz) asked if they wanted outpt therapy.

## 2013-07-27 NOTE — Telephone Encounter (Signed)
I have made referral to [facility] neuro rehab for SLP.

## 2013-07-28 DIAGNOSIS — R262 Difficulty in walking, not elsewhere classified: Secondary | ICD-10-CM | POA: Diagnosis not present

## 2013-07-28 DIAGNOSIS — M6281 Muscle weakness (generalized): Secondary | ICD-10-CM | POA: Diagnosis not present

## 2013-07-28 DIAGNOSIS — I619 Nontraumatic intracerebral hemorrhage, unspecified: Secondary | ICD-10-CM | POA: Diagnosis not present

## 2013-07-28 DIAGNOSIS — R279 Unspecified lack of coordination: Secondary | ICD-10-CM | POA: Diagnosis not present

## 2013-07-29 DIAGNOSIS — M6281 Muscle weakness (generalized): Secondary | ICD-10-CM | POA: Diagnosis not present

## 2013-07-29 DIAGNOSIS — I619 Nontraumatic intracerebral hemorrhage, unspecified: Secondary | ICD-10-CM | POA: Diagnosis not present

## 2013-07-29 DIAGNOSIS — R279 Unspecified lack of coordination: Secondary | ICD-10-CM | POA: Diagnosis not present

## 2013-07-29 DIAGNOSIS — R262 Difficulty in walking, not elsewhere classified: Secondary | ICD-10-CM | POA: Diagnosis not present

## 2013-08-01 DIAGNOSIS — R262 Difficulty in walking, not elsewhere classified: Secondary | ICD-10-CM | POA: Diagnosis not present

## 2013-08-01 DIAGNOSIS — R279 Unspecified lack of coordination: Secondary | ICD-10-CM | POA: Diagnosis not present

## 2013-08-01 DIAGNOSIS — I4891 Unspecified atrial fibrillation: Secondary | ICD-10-CM | POA: Diagnosis not present

## 2013-08-01 DIAGNOSIS — I619 Nontraumatic intracerebral hemorrhage, unspecified: Secondary | ICD-10-CM | POA: Diagnosis not present

## 2013-08-01 DIAGNOSIS — E785 Hyperlipidemia, unspecified: Secondary | ICD-10-CM | POA: Diagnosis not present

## 2013-08-01 DIAGNOSIS — F411 Generalized anxiety disorder: Secondary | ICD-10-CM | POA: Diagnosis not present

## 2013-08-01 DIAGNOSIS — I6789 Other cerebrovascular disease: Secondary | ICD-10-CM | POA: Diagnosis not present

## 2013-08-01 DIAGNOSIS — M6281 Muscle weakness (generalized): Secondary | ICD-10-CM | POA: Diagnosis not present

## 2013-08-01 DIAGNOSIS — N390 Urinary tract infection, site not specified: Secondary | ICD-10-CM | POA: Diagnosis not present

## 2013-08-01 DIAGNOSIS — I10 Essential (primary) hypertension: Secondary | ICD-10-CM | POA: Diagnosis not present

## 2013-08-02 DIAGNOSIS — M6281 Muscle weakness (generalized): Secondary | ICD-10-CM | POA: Diagnosis not present

## 2013-08-02 DIAGNOSIS — R279 Unspecified lack of coordination: Secondary | ICD-10-CM | POA: Diagnosis not present

## 2013-08-02 DIAGNOSIS — R262 Difficulty in walking, not elsewhere classified: Secondary | ICD-10-CM | POA: Diagnosis not present

## 2013-08-02 DIAGNOSIS — I619 Nontraumatic intracerebral hemorrhage, unspecified: Secondary | ICD-10-CM | POA: Diagnosis not present

## 2013-08-04 DIAGNOSIS — I619 Nontraumatic intracerebral hemorrhage, unspecified: Secondary | ICD-10-CM | POA: Diagnosis not present

## 2013-08-04 DIAGNOSIS — M6281 Muscle weakness (generalized): Secondary | ICD-10-CM | POA: Diagnosis not present

## 2013-08-04 DIAGNOSIS — R279 Unspecified lack of coordination: Secondary | ICD-10-CM | POA: Diagnosis not present

## 2013-08-04 DIAGNOSIS — R262 Difficulty in walking, not elsewhere classified: Secondary | ICD-10-CM | POA: Diagnosis not present

## 2013-08-05 DIAGNOSIS — I619 Nontraumatic intracerebral hemorrhage, unspecified: Secondary | ICD-10-CM | POA: Diagnosis not present

## 2013-08-05 DIAGNOSIS — M6281 Muscle weakness (generalized): Secondary | ICD-10-CM | POA: Diagnosis not present

## 2013-08-05 DIAGNOSIS — R262 Difficulty in walking, not elsewhere classified: Secondary | ICD-10-CM | POA: Diagnosis not present

## 2013-08-05 DIAGNOSIS — R279 Unspecified lack of coordination: Secondary | ICD-10-CM | POA: Diagnosis not present

## 2013-08-08 DIAGNOSIS — I619 Nontraumatic intracerebral hemorrhage, unspecified: Secondary | ICD-10-CM | POA: Diagnosis not present

## 2013-08-08 DIAGNOSIS — R262 Difficulty in walking, not elsewhere classified: Secondary | ICD-10-CM | POA: Diagnosis not present

## 2013-08-08 DIAGNOSIS — R279 Unspecified lack of coordination: Secondary | ICD-10-CM | POA: Diagnosis not present

## 2013-08-08 DIAGNOSIS — M6281 Muscle weakness (generalized): Secondary | ICD-10-CM | POA: Diagnosis not present

## 2013-08-09 DIAGNOSIS — R279 Unspecified lack of coordination: Secondary | ICD-10-CM | POA: Diagnosis not present

## 2013-08-09 DIAGNOSIS — I619 Nontraumatic intracerebral hemorrhage, unspecified: Secondary | ICD-10-CM | POA: Diagnosis not present

## 2013-08-09 DIAGNOSIS — M6281 Muscle weakness (generalized): Secondary | ICD-10-CM | POA: Diagnosis not present

## 2013-08-09 DIAGNOSIS — R262 Difficulty in walking, not elsewhere classified: Secondary | ICD-10-CM | POA: Diagnosis not present

## 2013-08-10 DIAGNOSIS — R279 Unspecified lack of coordination: Secondary | ICD-10-CM | POA: Diagnosis not present

## 2013-08-10 DIAGNOSIS — R262 Difficulty in walking, not elsewhere classified: Secondary | ICD-10-CM | POA: Diagnosis not present

## 2013-08-10 DIAGNOSIS — I619 Nontraumatic intracerebral hemorrhage, unspecified: Secondary | ICD-10-CM | POA: Diagnosis not present

## 2013-08-10 DIAGNOSIS — M6281 Muscle weakness (generalized): Secondary | ICD-10-CM | POA: Diagnosis not present

## 2013-08-19 DIAGNOSIS — Z950 Presence of cardiac pacemaker: Secondary | ICD-10-CM | POA: Diagnosis not present

## 2013-08-19 DIAGNOSIS — I69920 Aphasia following unspecified cerebrovascular disease: Secondary | ICD-10-CM | POA: Diagnosis not present

## 2013-08-19 DIAGNOSIS — I2510 Atherosclerotic heart disease of native coronary artery without angina pectoris: Secondary | ICD-10-CM | POA: Diagnosis not present

## 2013-08-19 DIAGNOSIS — IMO0001 Reserved for inherently not codable concepts without codable children: Secondary | ICD-10-CM | POA: Diagnosis not present

## 2013-08-19 DIAGNOSIS — I10 Essential (primary) hypertension: Secondary | ICD-10-CM | POA: Diagnosis not present

## 2013-08-19 DIAGNOSIS — I4891 Unspecified atrial fibrillation: Secondary | ICD-10-CM | POA: Diagnosis not present

## 2013-08-22 ENCOUNTER — Telehealth: Payer: Self-pay | Admitting: Neurology

## 2013-08-22 MED ORDER — SERTRALINE HCL 50 MG PO TABS: 50.0000 mg | ORAL_TABLET | Freq: Every day | ORAL | Status: DC

## 2013-08-22 MED ORDER — LEVETIRACETAM 500 MG PO TABS: 500.0000 mg | ORAL_TABLET | Freq: Two times a day (BID) | ORAL | Status: DC

## 2013-08-22 NOTE — Telephone Encounter (Signed)
Patient's daughter calling to state that patient has appointment with Dr. Leonie Man on 08/25/13 and will run out of her Keppra and Zoloft that day. Patient's daughter is wondering if they can get some samples or a written script to last her until [employer] sends them to patient. Please call patient and advise.

## 2013-08-22 NOTE — Telephone Encounter (Signed)
Patient has a follow up appt here on Friday and needs small supply to last until mail order arrives.  (Dr Leonie Man signed off on the ED note in system.)

## 2013-08-23 DIAGNOSIS — I2510 Atherosclerotic heart disease of native coronary artery without angina pectoris: Secondary | ICD-10-CM | POA: Diagnosis not present

## 2013-08-23 DIAGNOSIS — I10 Essential (primary) hypertension: Secondary | ICD-10-CM | POA: Diagnosis not present

## 2013-08-23 DIAGNOSIS — Z950 Presence of cardiac pacemaker: Secondary | ICD-10-CM | POA: Diagnosis not present

## 2013-08-23 DIAGNOSIS — IMO0001 Reserved for inherently not codable concepts without codable children: Secondary | ICD-10-CM | POA: Diagnosis not present

## 2013-08-23 DIAGNOSIS — I4891 Unspecified atrial fibrillation: Secondary | ICD-10-CM | POA: Diagnosis not present

## 2013-08-23 DIAGNOSIS — I69920 Aphasia following unspecified cerebrovascular disease: Secondary | ICD-10-CM | POA: Diagnosis not present

## 2013-08-25 ENCOUNTER — Encounter: Payer: Self-pay | Admitting: Nurse Practitioner

## 2013-08-25 ENCOUNTER — Ambulatory Visit (INDEPENDENT_AMBULATORY_CARE_PROVIDER_SITE_OTHER): Payer: Medicare Other | Admitting: Nurse Practitioner

## 2013-08-25 VITALS — BP 126/76 | HR 62 | Ht 62.5 in | Wt 159.0 lb

## 2013-08-25 DIAGNOSIS — R569 Unspecified convulsions: Secondary | ICD-10-CM

## 2013-08-25 DIAGNOSIS — I6350 Cerebral infarction due to unspecified occlusion or stenosis of unspecified cerebral artery: Secondary | ICD-10-CM | POA: Diagnosis not present

## 2013-08-25 DIAGNOSIS — I10 Essential (primary) hypertension: Secondary | ICD-10-CM | POA: Diagnosis not present

## 2013-08-25 DIAGNOSIS — I619 Nontraumatic intracerebral hemorrhage, unspecified: Secondary | ICD-10-CM

## 2013-08-25 DIAGNOSIS — E785 Hyperlipidemia, unspecified: Secondary | ICD-10-CM | POA: Diagnosis not present

## 2013-08-25 DIAGNOSIS — R4701 Aphasia: Secondary | ICD-10-CM | POA: Diagnosis not present

## 2013-08-25 MED ORDER — SIMVASTATIN 10 MG PO TABS: 10.0000 mg | ORAL_TABLET | Freq: Every evening | ORAL | Status: DC

## 2013-08-25 MED ORDER — LEVETIRACETAM 500 MG PO TABS: 500.0000 mg | ORAL_TABLET | Freq: Two times a day (BID) | ORAL | Status: DC

## 2013-08-25 NOTE — Progress Notes (Signed)
PATIENT: Ann Kaufman DOB: [birth year]  REASON FOR VISIT: follow up for ICH/TIA HISTORY FROM: patient  HISTORY OF PRESENT ILLNESS: Ann Kaufman is an 80 y.o. female with a history of a ICH on 05/18/2013 while on Xarelto for atrial fibrillation. Lives independently @ baseline. Brought by EMS to [facility] ED as code stroke and seizure. Intubated due to AMS with compromised airway and resp distress. CT head revealed Left frontal lobe intraparenchymal hematoma with intraventricular hemorrhage within the left lateral ventricle as above. Hemorrhagic infarct with intraventricular extension is suspected. No midline shift or hydrocephalus identified.  Carotid Duplex Findings suggested 1-39% internal carotid artery stenosis bilaterally.  She was with her daughter on 07/24/12 and while the two were walking her daughter noted neurological changes. Patient has a mild aphasia at baseline. Acutely though the patient became unable to speak and was having extensive difficulty getting her words out. Was also noted to have right upper extremity weakness as well. Patient remained ambulatory. Was brought to [facility] where a code stroke was called. Daughter reports that the patient's symptoms lasted about an hour and then resolved completely. Today is her first visit to the office post-hospital.  She is feeling well, back to living independently, with no residual deficits other than mild aphasia, which she had at baseline.  BP is well controlled, it is 126/76 in office today.  She is tolerating daily aspirin well without side effects, and is tolerating Keppra BID without complication.  She has had no recurrent seizures.  She has had a couple instances with irregularity in her peripheral vision, which resolved within moments.  She has an eye evaluation appointment in 2 weeks.  REVIEW OF SYSTEMS: Full 14 system review of systems performed and notable only for:  No complaints.  ALLERGIES: No Known Allergies  HOME  MEDICATIONS: Outpatient Prescriptions Prior to Visit  Medication Sig Dispense Refill  . ALPRAZolam (XANAX) 0.25 MG tablet Take 0.25 mg by mouth at bedtime as needed for anxiety.      Marland Kitchen amiodarone (PACERONE) 200 MG tablet Take 1 tablet (200 mg total) by mouth every morning.  30 tablet  1  . aspirin EC 81 MG EC tablet Take 1 tablet (81 mg total) by mouth daily.      . famotidine (PEPCID) 20 MG tablet Take 1 tablet (20 mg total) by mouth 2 (two) times daily.      . folic acid (FOLVITE) 073 MCG tablet Take 400 mcg by mouth every evening.      . levETIRAcetam (KEPPRA) 500 MG tablet Take 1 tablet (500 mg total) by mouth 2 (two) times daily.  30 tablet  1  . metoprolol tartrate (LOPRESSOR) 25 MG tablet Take 1 tablet (25 mg total) by mouth 2 (two) times daily.  60 tablet  1  . omeprazole (PRILOSEC) 20 MG capsule Take 1 capsule (20 mg total) by mouth every morning.  30 capsule  1  . senna (SENOKOT) 8.6 MG TABS tablet Take 2 tablets (17.2 mg total) by mouth at bedtime.  120 each  0  . sertraline (ZOLOFT) 50 MG tablet Take 1 tablet (50 mg total) by mouth at bedtime.  15 tablet  1  . simvastatin (ZOCOR) 10 MG tablet Take 1 tablet (10 mg total) by mouth every evening.  30 tablet  1  . vitamin B-12 (CYANOCOBALAMIN) 1000 MCG tablet Take 1 tablet (1,000 mcg total) by mouth every evening.  30 tablet  1  . Vitamin D, Ergocalciferol, (DRISDOL) 50000 UNITS  CAPS capsule Take 1 capsule (50,000 Units total) by mouth every 14 (fourteen) days.  4 capsule  1  . diclofenac (FLECTOR) 1.3 % PTCH Place 1 patch onto the skin 2 (two) times daily.      . methocarbamol (ROBAXIN) 500 MG tablet Take 1 tablet (500 mg total) by mouth every 6 (six) hours as needed for muscle spasms.      Marland Kitchen PRESCRIPTION MEDICATION ABR 07-17-08 (ativan,benadryl, reglan)  apply to inner wrist every 4 hours as needed for anxiety.      . traMADol (ULTRAM) 50 MG tablet Take 1 tablet (50 mg total) by mouth 4 (four) times daily as needed (Moderate pain).  30  tablet  0   No facility-administered medications prior to visit.     PHYSICAL EXAM  Filed Vitals:   08/25/13 1306  BP: 126/76  Pulse: 62  Height: 5' 2.5" (1.588 m)  Weight: 159 lb (72.122 kg)   Body mass index is 28.6 kg/(m^2).  Generalized: Well developed, in no acute distress  Head: normocephalic and atraumatic. Oropharynx benign  Neck: Supple, no carotid bruits  Cardiac: Regular rate rhythm, no murmur  Musculoskeletal: No deformity   Neurological examination  Mentation: Alert oriented to time, place, history taking. Follows all commands speech and language with mild expressive aphasia and word-finding difficulties. Cranial nerve II-XII: Pupils were equal round reactive to light extraocular movements were full, visual field were full on confrontational test. Facial sensation and strength were normal. hearing was intact to finger rubbing bilaterally. Uvula tongue midline. head turning and shoulder shrug and were normal and symmetric.Tongue protrusion into cheek strength was normal. Motor: The motor testing reveals 5 over 5 strength of all 4 extremities. Good symmetric motor tone is noted throughout.  Sensory: Sensory testing is intact to pinprick, soft touch, vibration sensation, and position sense on all 4 extremities. No evidence of extinction is noted.  Coordination: Cerebellar testing reveals good finger-nose-finger and heel-to-shin bilaterally.  Gait and station: Gait is normal. Tandem gait is normal. Romberg is negative. No drift is seen.  Reflexes: Deep tendon reflexes are symmetric and normal bilaterally. Toes are downgoing bilaterally.   DIAGNOSTIC DATA (LABS, IMAGING, TESTING) - I reviewed patient records, labs, notes, testing and imaging myself where available.  Lab Results  Component Value Date   HGBA1C 6.1* 07/25/2013   Lab Results  Component Value Date   [id number] 0981* 06/03/2013   Lab Results  Component Value Date   TSH 4.468 07/25/2013    ASSESSMENT AND  PLAN Ann Kaufman is a 80 y.o. female presented with a left frontal intracranial hemorrhage on 05/18/13. The patient was on Xarelto prior to admission.  Seizure at the time of presentation. She later presented with aphasia and RUE hemiparesis that resolved on 07/24/12. Left brain infarct vs TIA. Imaging confirmation not possible due to pacemaker. Regardless, it would not change treatment. Infarct or TIA felt to be embolic secondary to known atrial fibrillation.  PLAN: Continue aspirin 81 mg orally every day  for secondary stroke prevention and maintain strict control of hypertension with blood pressure goal below 130/90, diabetes with hemoglobin A1c goal below 6.5% and lipids with LDL cholesterol goal below 100 mg/dL. Continue Keppra for seizure prevention.  Patient was advised to wait to resume driving until after eye evaluation, and then a gradual return with another passenger at first and for short distances only. Followup in the future with me in 3 months.  Rudi Rummage Aeryn Medici, MSN, NP-C 08/25/2013, 1:14 PM [facility] [address], [place], [place] [address] [phone]  Note: This document was prepared with digital dictation and possible smart phrase technology. Any transcriptional errors that result from this process are unintentional.

## 2013-08-25 NOTE — Patient Instructions (Addendum)
Continue aspirin 81 mg orally every day  for secondary stroke prevention and maintain strict control of hypertension with blood pressure goal below 130/90, diabetes with hemoglobin A1c goal below 6.5% and lipids with LDL cholesterol goal below 100 mg/dL.  Continue Keppra twice daily for seizure prevention. Followup in the future with me in 3 months.  Seizure, Adult A seizure is abnormal electrical activity in the brain. Seizures usually last from 30 seconds to 2 minutes. There are various types of seizures. Before a seizure, you may have a warning sensation (aura) that a seizure is about to occur. An aura may include the following symptoms:   Fear or anxiety.  Nausea.  Feeling like the room is spinning (vertigo).  Vision changes, such as seeing flashing lights or spots. Common symptoms during a seizure include:  A change in attention or behavior (altered mental status).  Convulsions with rhythmic jerking movements.  Drooling.  Rapid eye movements.  Grunting.  Loss of bladder and bowel control.  Bitter taste in the mouth.  Tongue biting. After a seizure, you may feel confused and sleepy. You may also have an injury resulting from convulsions during the seizure. HOME CARE INSTRUCTIONS   If you are given medicines, take them exactly as prescribed by your health care provider.  Keep all follow-up appointments as directed by your health care provider.  Do not swim or drive or engage in risky activity during which a seizure could cause further injury to you or others until your health care provider says it is OK.  Get adequate rest.  Teach friends and family what to do if you have a seizure. They should:  Lay you on the ground to prevent a fall.  Put a cushion under your head.  Loosen any tight clothing around your neck.  Turn you on your side. If vomiting occurs, this helps keep your airway clear.  Stay with you until you recover.  Know whether or not you need  emergency care. SEEK IMMEDIATE MEDICAL CARE IF:  The seizure lasts longer than 5 minutes.  The seizure is severe or you do not wake up immediately after the seizure.  You have an altered mental status after the seizure.  You are having more frequent or worsening seizures. Someone should drive you to the emergency department or call local emergency services (911 in [place] MAKE SURE YOU:  Understand these instructions.  Will watch your condition.  Will get help right away if you are not doing well or get worse. Document Released: 06/06/2000 Document Revised: 03/30/2013 Document Reviewed: 01/19/2013 [facility] Patient Information 2014 [place].

## 2013-08-26 DIAGNOSIS — I4891 Unspecified atrial fibrillation: Secondary | ICD-10-CM | POA: Diagnosis not present

## 2013-08-26 DIAGNOSIS — I2510 Atherosclerotic heart disease of native coronary artery without angina pectoris: Secondary | ICD-10-CM | POA: Diagnosis not present

## 2013-08-26 DIAGNOSIS — I10 Essential (primary) hypertension: Secondary | ICD-10-CM | POA: Diagnosis not present

## 2013-08-26 DIAGNOSIS — IMO0001 Reserved for inherently not codable concepts without codable children: Secondary | ICD-10-CM | POA: Diagnosis not present

## 2013-08-26 DIAGNOSIS — Z950 Presence of cardiac pacemaker: Secondary | ICD-10-CM | POA: Diagnosis not present

## 2013-08-26 DIAGNOSIS — I69920 Aphasia following unspecified cerebrovascular disease: Secondary | ICD-10-CM | POA: Diagnosis not present

## 2013-08-29 DIAGNOSIS — I6789 Other cerebrovascular disease: Secondary | ICD-10-CM | POA: Diagnosis not present

## 2013-08-31 DIAGNOSIS — I4891 Unspecified atrial fibrillation: Secondary | ICD-10-CM | POA: Diagnosis not present

## 2013-08-31 DIAGNOSIS — IMO0001 Reserved for inherently not codable concepts without codable children: Secondary | ICD-10-CM | POA: Diagnosis not present

## 2013-08-31 DIAGNOSIS — I69920 Aphasia following unspecified cerebrovascular disease: Secondary | ICD-10-CM | POA: Diagnosis not present

## 2013-08-31 DIAGNOSIS — Z950 Presence of cardiac pacemaker: Secondary | ICD-10-CM | POA: Diagnosis not present

## 2013-08-31 DIAGNOSIS — I10 Essential (primary) hypertension: Secondary | ICD-10-CM | POA: Diagnosis not present

## 2013-08-31 DIAGNOSIS — I2510 Atherosclerotic heart disease of native coronary artery without angina pectoris: Secondary | ICD-10-CM | POA: Diagnosis not present

## 2013-09-01 ENCOUNTER — Encounter: Payer: Self-pay | Admitting: Nurse Practitioner

## 2013-09-02 ENCOUNTER — Other Ambulatory Visit: Payer: Self-pay

## 2013-09-02 MED ORDER — SERTRALINE HCL 50 MG PO TABS: 50.0000 mg | ORAL_TABLET | Freq: Every day | ORAL | Status: DC

## 2013-09-05 DIAGNOSIS — I4891 Unspecified atrial fibrillation: Secondary | ICD-10-CM | POA: Diagnosis not present

## 2013-09-05 DIAGNOSIS — IMO0001 Reserved for inherently not codable concepts without codable children: Secondary | ICD-10-CM | POA: Diagnosis not present

## 2013-09-05 DIAGNOSIS — I2510 Atherosclerotic heart disease of native coronary artery without angina pectoris: Secondary | ICD-10-CM | POA: Diagnosis not present

## 2013-09-05 DIAGNOSIS — I10 Essential (primary) hypertension: Secondary | ICD-10-CM | POA: Diagnosis not present

## 2013-09-05 DIAGNOSIS — I69920 Aphasia following unspecified cerebrovascular disease: Secondary | ICD-10-CM | POA: Diagnosis not present

## 2013-09-05 DIAGNOSIS — Z950 Presence of cardiac pacemaker: Secondary | ICD-10-CM | POA: Diagnosis not present

## 2013-09-07 DIAGNOSIS — I2510 Atherosclerotic heart disease of native coronary artery without angina pectoris: Secondary | ICD-10-CM | POA: Diagnosis not present

## 2013-09-07 DIAGNOSIS — Z950 Presence of cardiac pacemaker: Secondary | ICD-10-CM | POA: Diagnosis not present

## 2013-09-07 DIAGNOSIS — I10 Essential (primary) hypertension: Secondary | ICD-10-CM | POA: Diagnosis not present

## 2013-09-07 DIAGNOSIS — I4891 Unspecified atrial fibrillation: Secondary | ICD-10-CM | POA: Diagnosis not present

## 2013-09-07 DIAGNOSIS — I69920 Aphasia following unspecified cerebrovascular disease: Secondary | ICD-10-CM | POA: Diagnosis not present

## 2013-09-07 DIAGNOSIS — IMO0001 Reserved for inherently not codable concepts without codable children: Secondary | ICD-10-CM | POA: Diagnosis not present

## 2013-09-12 ENCOUNTER — Encounter: Payer: Self-pay | Admitting: Physical Medicine & Rehabilitation

## 2013-09-12 ENCOUNTER — Encounter: Payer: Medicare Other | Attending: Physical Medicine & Rehabilitation | Admitting: Physical Medicine & Rehabilitation

## 2013-09-12 VITALS — BP 141/67 | HR 84 | Resp 14 | Ht 63.0 in | Wt 159.0 lb

## 2013-09-12 DIAGNOSIS — I69998 Other sequelae following unspecified cerebrovascular disease: Secondary | ICD-10-CM | POA: Diagnosis not present

## 2013-09-12 DIAGNOSIS — F3289 Other specified depressive episodes: Secondary | ICD-10-CM | POA: Diagnosis not present

## 2013-09-12 DIAGNOSIS — I6350 Cerebral infarction due to unspecified occlusion or stenosis of unspecified cerebral artery: Secondary | ICD-10-CM

## 2013-09-12 DIAGNOSIS — M47817 Spondylosis without myelopathy or radiculopathy, lumbosacral region: Secondary | ICD-10-CM | POA: Diagnosis not present

## 2013-09-12 DIAGNOSIS — I10 Essential (primary) hypertension: Secondary | ICD-10-CM | POA: Insufficient documentation

## 2013-09-12 DIAGNOSIS — Z79899 Other long term (current) drug therapy: Secondary | ICD-10-CM | POA: Insufficient documentation

## 2013-09-12 DIAGNOSIS — I619 Nontraumatic intracerebral hemorrhage, unspecified: Secondary | ICD-10-CM | POA: Insufficient documentation

## 2013-09-12 DIAGNOSIS — F329 Major depressive disorder, single episode, unspecified: Secondary | ICD-10-CM | POA: Insufficient documentation

## 2013-09-12 DIAGNOSIS — Z950 Presence of cardiac pacemaker: Secondary | ICD-10-CM | POA: Diagnosis not present

## 2013-09-12 DIAGNOSIS — R569 Unspecified convulsions: Secondary | ICD-10-CM | POA: Diagnosis not present

## 2013-09-12 DIAGNOSIS — I69928 Other speech and language deficits following unspecified cerebrovascular disease: Secondary | ICD-10-CM | POA: Diagnosis not present

## 2013-09-12 DIAGNOSIS — I4891 Unspecified atrial fibrillation: Secondary | ICD-10-CM | POA: Diagnosis not present

## 2013-09-12 NOTE — Progress Notes (Signed)
Subjective:    Patient ID: Ann Kaufman, female    DOB: [birth year], 80 y.o.   MRN: [id number]  HPI  Ann Kaufman is back regarding her ICH. She is at an [place]. Her previous house just sold. She is no longer using her walker. Her balance has been pretty good. She fell once on the snow.    Her pain is under good control.    Pain Inventory Average Pain 0 Pain Right Now 0 My pain is no pain  In the last 24 hours, has pain interfered with the following? General activity 0 Relation with others 0 Enjoyment of life 0 What TIME of day is your pain at its worst? no pain Sleep (in general) Good  Pain is worse with: no pain Pain improves with: no pain Relief from Meds: no pain meds  Mobility walk without assistance ability to climb steps?  yes transfers alone  Function retired  Neuro/Psych No problems in this area  Prior Studies Any changes since last visit?  no  Physicians involved in your care Any changes since last visit?  no   Family History  Problem Relation Age of Onset  . Other      negative for premature CAD.   History   Social History  . Marital Status: Married    Spouse Name: paul    Number of Children: 3  . Years of Education: 12   Occupational History  . retired    Social History Main Topics  . Smoking status: Former Smoker    Quit date: 11/26/1968  . Smokeless tobacco: None  . Alcohol Use: No  . Drug Use: No  . Sexual Activity: None   Other Topics Concern  . None   Social History Narrative   Lives in [place] by herself.  Daughter lives in [place].   Past Surgical History  Procedure Laterality Date  . Pacemaker insertion    . Abdominal hysterectomy    . Breast lumpectomy    . Appendectomy    . Bowel resection     Past Medical History  Diagnosis Date  . Intracerebral hemorrhage     a. 04/2013 in setting of xarelto therapy.  . Atrial fibrillation     a. Dx in 2012;  b. Rhytm controlled - amiodarone, previously  anticoagulated with xarelto (d/c'd 04/2013 in setting of [place]);  b. 04/2013 Echo: EF 55-60%, Gr 1 DD, mild MR, mod dil LA, PAsP 95mmhg.  . Hypertension   . Depression   . Arthritis   . Presence of permanent cardiac pacemaker     a. 2012 MDT, placed in [place] (probable tachy-brady).  . Pacemaker    BP 141/67  Pulse 84  Resp 14  Ht 5\' 3"  (1.6 m)  Wt 159 lb (72.122 kg)  BMI 28.17 kg/m2  SpO2 98%  Opioid Risk Score:   Fall Risk Score: Moderate Fall Risk (6-13 points) (pt educated and given brochure on fall risk previously)   Review of Systems  All other systems reviewed and are negative.       Objective:   Physical Exam  Constitutional: She is oriented to person, place, and time. She appears well-developed and well-nourished.  HENT:  Head: Normocephalic and atraumatic.  Eyes: Conjunctivae and EOM are normal. Pupils are equal, round, and reactive to light. Right eye exhibits no discharge. Left eye exhibits no discharge. No scleral icterus.  Neck: Normal range of motion. Neck supple.  Cardiovascular: Normal rate and regular rhythm. Exam reveals no  gallop and no friction rub.  No murmur heard.  Respiratory: Effort normal. No respiratory distress. She has no wheezes.  GI: Soft. Bowel sounds are normal. She exhibits no distension. There is no tenderness.  Musculoskeletal: She exhibits no edema. Minimal low back pain today at rest  Neurological: She is drowsy, distracted, confused. Limited insight and awareness Right facial weakness improved. Speech clearer.  Language is less apraxic.   Extremely alert. Follows simple commands. Improved insight and awareness. Oriented to place, month, year with cues.  Moves all 4's fairly equally now although she still has some difficulty with fine motor movements of the right hand. She past points with both hands, right more than left. Gait is improved with a slight posterior lean. Tandem gait caused a little balance loss to the left.  Skin: Skin  is warm and dry.  A few limb bruises noted  Psychiatric: pleasant, appropriate, carried a conversation despite language issues. Good insight and awareness overall    Assessment/Plan:  1. Functional deficits secondary to left frontal hematoma with seizure activity  -SLP to conclusion -may be appropriate for driver rehab. gaver her contact numbers today.  Recommended a cane if she walks longer dx 2. Lumbar spondylosis--lidoderm patches prn, tylenol,much improved 3. H/o depression/Mood: fluctuates but overall improved--limiting neurosedating meds  - zoloft (which she took at home)   5. A fib with PPM.  7. New onset seizure: on keppra bid   Follow up with me PRN

## 2013-09-12 NOTE — Patient Instructions (Signed)
PLEASE CALL ME WITH ANY PROBLEMS OR QUESTIONS (#[phone]).      

## 2013-09-13 DIAGNOSIS — I69920 Aphasia following unspecified cerebrovascular disease: Secondary | ICD-10-CM | POA: Diagnosis not present

## 2013-09-13 DIAGNOSIS — Z950 Presence of cardiac pacemaker: Secondary | ICD-10-CM | POA: Diagnosis not present

## 2013-09-13 DIAGNOSIS — IMO0001 Reserved for inherently not codable concepts without codable children: Secondary | ICD-10-CM | POA: Diagnosis not present

## 2013-09-13 DIAGNOSIS — I4891 Unspecified atrial fibrillation: Secondary | ICD-10-CM | POA: Diagnosis not present

## 2013-09-13 DIAGNOSIS — I10 Essential (primary) hypertension: Secondary | ICD-10-CM | POA: Diagnosis not present

## 2013-09-13 DIAGNOSIS — I2510 Atherosclerotic heart disease of native coronary artery without angina pectoris: Secondary | ICD-10-CM | POA: Diagnosis not present

## 2013-09-15 DIAGNOSIS — IMO0001 Reserved for inherently not codable concepts without codable children: Secondary | ICD-10-CM | POA: Diagnosis not present

## 2013-09-15 DIAGNOSIS — I69920 Aphasia following unspecified cerebrovascular disease: Secondary | ICD-10-CM | POA: Diagnosis not present

## 2013-09-15 DIAGNOSIS — I10 Essential (primary) hypertension: Secondary | ICD-10-CM | POA: Diagnosis not present

## 2013-09-15 DIAGNOSIS — Z950 Presence of cardiac pacemaker: Secondary | ICD-10-CM | POA: Diagnosis not present

## 2013-09-15 DIAGNOSIS — I2510 Atherosclerotic heart disease of native coronary artery without angina pectoris: Secondary | ICD-10-CM | POA: Diagnosis not present

## 2013-09-15 DIAGNOSIS — I4891 Unspecified atrial fibrillation: Secondary | ICD-10-CM | POA: Diagnosis not present

## 2013-09-19 DIAGNOSIS — I10 Essential (primary) hypertension: Secondary | ICD-10-CM | POA: Diagnosis not present

## 2013-09-19 DIAGNOSIS — IMO0001 Reserved for inherently not codable concepts without codable children: Secondary | ICD-10-CM | POA: Diagnosis not present

## 2013-09-19 DIAGNOSIS — I4891 Unspecified atrial fibrillation: Secondary | ICD-10-CM | POA: Diagnosis not present

## 2013-09-19 DIAGNOSIS — I69920 Aphasia following unspecified cerebrovascular disease: Secondary | ICD-10-CM | POA: Diagnosis not present

## 2013-09-19 DIAGNOSIS — I2510 Atherosclerotic heart disease of native coronary artery without angina pectoris: Secondary | ICD-10-CM | POA: Diagnosis not present

## 2013-09-19 DIAGNOSIS — Z950 Presence of cardiac pacemaker: Secondary | ICD-10-CM | POA: Diagnosis not present

## 2013-09-21 DIAGNOSIS — I10 Essential (primary) hypertension: Secondary | ICD-10-CM | POA: Diagnosis not present

## 2013-09-21 DIAGNOSIS — I4891 Unspecified atrial fibrillation: Secondary | ICD-10-CM | POA: Diagnosis not present

## 2013-09-21 DIAGNOSIS — I69920 Aphasia following unspecified cerebrovascular disease: Secondary | ICD-10-CM | POA: Diagnosis not present

## 2013-09-21 DIAGNOSIS — IMO0001 Reserved for inherently not codable concepts without codable children: Secondary | ICD-10-CM | POA: Diagnosis not present

## 2013-09-21 DIAGNOSIS — I2510 Atherosclerotic heart disease of native coronary artery without angina pectoris: Secondary | ICD-10-CM | POA: Diagnosis not present

## 2013-09-21 DIAGNOSIS — Z950 Presence of cardiac pacemaker: Secondary | ICD-10-CM | POA: Diagnosis not present

## 2013-09-27 DIAGNOSIS — I10 Essential (primary) hypertension: Secondary | ICD-10-CM | POA: Diagnosis not present

## 2013-09-27 DIAGNOSIS — I4891 Unspecified atrial fibrillation: Secondary | ICD-10-CM | POA: Diagnosis not present

## 2013-09-27 DIAGNOSIS — Z950 Presence of cardiac pacemaker: Secondary | ICD-10-CM | POA: Diagnosis not present

## 2013-09-27 DIAGNOSIS — I69920 Aphasia following unspecified cerebrovascular disease: Secondary | ICD-10-CM | POA: Diagnosis not present

## 2013-09-27 DIAGNOSIS — I2510 Atherosclerotic heart disease of native coronary artery without angina pectoris: Secondary | ICD-10-CM | POA: Diagnosis not present

## 2013-09-27 DIAGNOSIS — IMO0001 Reserved for inherently not codable concepts without codable children: Secondary | ICD-10-CM | POA: Diagnosis not present

## 2013-09-29 DIAGNOSIS — I4891 Unspecified atrial fibrillation: Secondary | ICD-10-CM | POA: Diagnosis not present

## 2013-09-29 DIAGNOSIS — Z950 Presence of cardiac pacemaker: Secondary | ICD-10-CM | POA: Diagnosis not present

## 2013-09-29 DIAGNOSIS — I2510 Atherosclerotic heart disease of native coronary artery without angina pectoris: Secondary | ICD-10-CM | POA: Diagnosis not present

## 2013-09-29 DIAGNOSIS — I69920 Aphasia following unspecified cerebrovascular disease: Secondary | ICD-10-CM | POA: Diagnosis not present

## 2013-09-29 DIAGNOSIS — I10 Essential (primary) hypertension: Secondary | ICD-10-CM | POA: Diagnosis not present

## 2013-09-29 DIAGNOSIS — IMO0001 Reserved for inherently not codable concepts without codable children: Secondary | ICD-10-CM | POA: Diagnosis not present

## 2013-10-03 DIAGNOSIS — I2510 Atherosclerotic heart disease of native coronary artery without angina pectoris: Secondary | ICD-10-CM | POA: Diagnosis not present

## 2013-10-03 DIAGNOSIS — I10 Essential (primary) hypertension: Secondary | ICD-10-CM | POA: Diagnosis not present

## 2013-10-03 DIAGNOSIS — I4891 Unspecified atrial fibrillation: Secondary | ICD-10-CM | POA: Diagnosis not present

## 2013-10-03 DIAGNOSIS — IMO0001 Reserved for inherently not codable concepts without codable children: Secondary | ICD-10-CM | POA: Diagnosis not present

## 2013-10-03 DIAGNOSIS — I69920 Aphasia following unspecified cerebrovascular disease: Secondary | ICD-10-CM | POA: Diagnosis not present

## 2013-10-03 DIAGNOSIS — Z950 Presence of cardiac pacemaker: Secondary | ICD-10-CM | POA: Diagnosis not present

## 2013-10-07 ENCOUNTER — Telehealth: Payer: Self-pay | Admitting: Internal Medicine

## 2013-10-07 NOTE — Telephone Encounter (Signed)
Records received From [facility] gave to Operator Room For Appt With Dr.Allred on  4.29 4.17.15/kdm

## 2013-10-10 DIAGNOSIS — G459 Transient cerebral ischemic attack, unspecified: Secondary | ICD-10-CM | POA: Diagnosis not present

## 2013-10-10 DIAGNOSIS — L408 Other psoriasis: Secondary | ICD-10-CM | POA: Diagnosis not present

## 2013-10-10 DIAGNOSIS — I10 Essential (primary) hypertension: Secondary | ICD-10-CM | POA: Diagnosis not present

## 2013-10-10 DIAGNOSIS — I619 Nontraumatic intracerebral hemorrhage, unspecified: Secondary | ICD-10-CM | POA: Diagnosis not present

## 2013-10-10 DIAGNOSIS — E785 Hyperlipidemia, unspecified: Secondary | ICD-10-CM | POA: Diagnosis not present

## 2013-10-10 DIAGNOSIS — M129 Arthropathy, unspecified: Secondary | ICD-10-CM | POA: Diagnosis not present

## 2013-10-10 DIAGNOSIS — F341 Dysthymic disorder: Secondary | ICD-10-CM | POA: Diagnosis not present

## 2013-10-10 DIAGNOSIS — I4891 Unspecified atrial fibrillation: Secondary | ICD-10-CM | POA: Diagnosis not present

## 2013-10-14 DIAGNOSIS — Z950 Presence of cardiac pacemaker: Secondary | ICD-10-CM | POA: Diagnosis not present

## 2013-10-14 DIAGNOSIS — IMO0001 Reserved for inherently not codable concepts without codable children: Secondary | ICD-10-CM | POA: Diagnosis not present

## 2013-10-14 DIAGNOSIS — I69920 Aphasia following unspecified cerebrovascular disease: Secondary | ICD-10-CM | POA: Diagnosis not present

## 2013-10-14 DIAGNOSIS — I10 Essential (primary) hypertension: Secondary | ICD-10-CM | POA: Diagnosis not present

## 2013-10-14 DIAGNOSIS — I2510 Atherosclerotic heart disease of native coronary artery without angina pectoris: Secondary | ICD-10-CM | POA: Diagnosis not present

## 2013-10-14 DIAGNOSIS — I4891 Unspecified atrial fibrillation: Secondary | ICD-10-CM | POA: Diagnosis not present

## 2013-10-18 ENCOUNTER — Telehealth: Payer: Self-pay | Admitting: *Deleted

## 2013-10-18 ENCOUNTER — Telehealth: Payer: Self-pay

## 2013-10-18 DIAGNOSIS — I4891 Unspecified atrial fibrillation: Secondary | ICD-10-CM | POA: Diagnosis not present

## 2013-10-18 DIAGNOSIS — Z950 Presence of cardiac pacemaker: Secondary | ICD-10-CM | POA: Diagnosis not present

## 2013-10-18 DIAGNOSIS — I2510 Atherosclerotic heart disease of native coronary artery without angina pectoris: Secondary | ICD-10-CM | POA: Diagnosis not present

## 2013-10-18 DIAGNOSIS — I10 Essential (primary) hypertension: Secondary | ICD-10-CM | POA: Diagnosis not present

## 2013-10-18 DIAGNOSIS — I69920 Aphasia following unspecified cerebrovascular disease: Secondary | ICD-10-CM | POA: Diagnosis not present

## 2013-10-18 DIAGNOSIS — IMO0001 Reserved for inherently not codable concepts without codable children: Secondary | ICD-10-CM | POA: Diagnosis not present

## 2013-10-18 NOTE — Telephone Encounter (Signed)
Verbal given to continue therapy. 

## 2013-10-18 NOTE — Telephone Encounter (Signed)
Santiago Glad (daughter) calling requesting new rx for Speech Therapy and need order written on rx pad.  thanks

## 2013-10-18 NOTE — Telephone Encounter (Signed)
That is fine. Which therapy is she referring to?. Perhaps therapist who treated her should contact us about ongoing needs

## 2013-10-18 NOTE — Telephone Encounter (Signed)
Pt's daughter Santiago Glad calling requesting a new Rx for ST and daughter did not know which doctor to call and I informed the daughter that Dr. Alger Simons referred the pt and that she could call them and they should just call for more ST. I advised the daughter that if the pt has any other other problems, questions or concerns to call the office. Daughter verbalized understanding.

## 2013-10-18 NOTE — Telephone Encounter (Signed)
Patients daughter called requesting additional therapy with [facility].  Please advise.

## 2013-10-18 NOTE — Telephone Encounter (Signed)
Have SLP contact us with what they need and I"ll make the new order

## 2013-10-19 ENCOUNTER — Ambulatory Visit (INDEPENDENT_AMBULATORY_CARE_PROVIDER_SITE_OTHER): Payer: Medicare Other | Admitting: Internal Medicine

## 2013-10-19 ENCOUNTER — Encounter: Payer: Self-pay | Admitting: Internal Medicine

## 2013-10-19 VITALS — BP 129/80 | HR 67 | Ht 63.0 in | Wt 159.0 lb

## 2013-10-19 DIAGNOSIS — I480 Paroxysmal atrial fibrillation: Secondary | ICD-10-CM

## 2013-10-19 DIAGNOSIS — I619 Nontraumatic intracerebral hemorrhage, unspecified: Secondary | ICD-10-CM

## 2013-10-19 DIAGNOSIS — I4891 Unspecified atrial fibrillation: Secondary | ICD-10-CM

## 2013-10-19 DIAGNOSIS — I10 Essential (primary) hypertension: Secondary | ICD-10-CM

## 2013-10-19 DIAGNOSIS — IMO0001 Reserved for inherently not codable concepts without codable children: Secondary | ICD-10-CM | POA: Diagnosis not present

## 2013-10-19 DIAGNOSIS — I495 Sick sinus syndrome: Secondary | ICD-10-CM | POA: Diagnosis not present

## 2013-10-19 DIAGNOSIS — I2510 Atherosclerotic heart disease of native coronary artery without angina pectoris: Secondary | ICD-10-CM | POA: Diagnosis not present

## 2013-10-19 DIAGNOSIS — I6350 Cerebral infarction due to unspecified occlusion or stenosis of unspecified cerebral artery: Secondary | ICD-10-CM

## 2013-10-19 DIAGNOSIS — Z950 Presence of cardiac pacemaker: Secondary | ICD-10-CM | POA: Diagnosis not present

## 2013-10-19 DIAGNOSIS — I69920 Aphasia following unspecified cerebrovascular disease: Secondary | ICD-10-CM | POA: Diagnosis not present

## 2013-10-19 NOTE — Progress Notes (Signed)
Velna Hatchet, MD: PCP   Ann Kaufman is a 80 y.o. female with a h/o sinus bradycardia sp PPM (MDT) by Dr Saralyn Pilar who presents today to establish care in the Electrophysiology device clinic.  She recently moved from [place] to [place] to be near her daughter.  She also has a history of atrial fibrillation maintained on Amiodarone.  She is not currently on anticoagulation due to history of intracranial hemorrhage.   The patient reports doing very well since having a pacemaker implanted and remains very active despite her age.   Today, she  denies symptoms of palpitations, chest pain, shortness of breath, orthopnea, PND, lower extremity edema, dizziness, presyncope, syncope, or neurologic sequela.  The patientis tolerating medications without difficulties and is otherwise without complaint today.   Past Medical History  Diagnosis Date  . Intracerebral hemorrhage     a. 04/2013 in setting of xarelto therapy.  . Atrial fibrillation 2012    a. Dx in 2012;  b. Rhythm controlled - amiodarone, previously anticoagulated with xarelto (d/c'd 04/2013 in setting of [place]);  b. 04/2013 Echo: EF 55-60%, Gr 1 DD, mild MR, mod dil LA, PAsP 58mmhg  . Hypertension   . Depression   . Arthritis   . Presence of permanent cardiac pacemaker     a. 2012 MDT, placed in [place] (probable tachy-brady).  . Pacemaker   . SSS (sick sinus syndrome)     s/p dual chamber pacemaker  . PAF (paroxysmal atrial fibrillation)   . Aphasia     Occasional aphasia & facial numbness from a left frontal lobe intercerebral hemorrhage on Xarelto on 05/18/13.  Marland Kitchen Pericardial effusion   . Mitral valve prolapse   . Hyperlipidemia   . Rheumatoid arthritis   . SOB (shortness of breath)     Occasional SOB  . Palpitations     Occasional.  . Peripheral edema     Because she has been standing on her feet more than usual    Past Surgical History  Procedure Laterality Date  . Pacemaker insertion      MDT [facility] at  [facility]  . Abdominal hysterectomy    . Breast lumpectomy    . Appendectomy    . Bowel resection    . Lead revision  09/17/11    s/p atrial lead revision    History   Social History  . Marital Status: Married    Spouse Name: paul    Number of Children: 3  . Years of Education: 12   Occupational History  . retired    Social History Main Topics  . Smoking status: Former Smoker    Quit date: 11/26/1968  . Smokeless tobacco: Not on file  . Alcohol Use: No  . Drug Use: No  . Sexual Activity: Not on file   Other Topics Concern  . Not on file   Social History Narrative   Lives in [place] by herself.  Daughter lives in [place].    Family History  Problem Relation Age of Onset  . Other      negative for premature CAD.  Marland Kitchen Aneurysm Father     Allergies  Allergen Reactions  . Latex     Current Outpatient Prescriptions  Medication Sig Dispense Refill  . ALPRAZolam (XANAX) 0.25 MG tablet Take 0.25 mg by mouth at bedtime as needed for anxiety.      Marland Kitchen amiodarone (PACERONE) 200 MG tablet Take 1 tablet (200 mg total) by mouth every morning.  30 tablet  1  . aspirin EC 81 MG EC tablet Take 1 tablet (81 mg total) by mouth daily.      . famotidine (PEPCID) 20 MG tablet Take 1 tablet (20 mg total) by mouth 2 (two) times daily.      . folic acid (FOLVITE) 845 MCG tablet Take 400 mcg by mouth every evening.      . levETIRAcetam (KEPPRA) 500 MG tablet Take 1 tablet (500 mg total) by mouth 2 (two) times daily.  180 tablet  3  . metoprolol tartrate (LOPRESSOR) 25 MG tablet Take 1 tablet (25 mg total) by mouth 2 (two) times daily.  60 tablet  1  . omeprazole (PRILOSEC) 20 MG capsule Take 1 capsule (20 mg total) by mouth every morning.  30 capsule  1  . senna (SENOKOT) 8.6 MG TABS tablet Take 2 tablets (17.2 mg total) by mouth at bedtime.  120 each  0  . sertraline (ZOLOFT) 50 MG tablet Take 1 tablet (50 mg total) by mouth at bedtime.  90 tablet  3  . simvastatin (ZOCOR) 10 MG tablet Take 1  tablet (10 mg total) by mouth every evening.  15 tablet  0  . vitamin B-12 (CYANOCOBALAMIN) 1000 MCG tablet Take 1 tablet (1,000 mcg total) by mouth every evening.  30 tablet  1  . Vitamin D, Ergocalciferol, (DRISDOL) 50000 UNITS CAPS capsule Take 1 capsule (50,000 Units total) by mouth every 14 (fourteen) days.  4 capsule  1   No current facility-administered medications for this visit.    ROS- all systems are reviewed and negative except as per HPI  Physical Exam: Filed Vitals:   10/19/13 1022  BP: 129/80  Pulse: 67  Height: 5\' 3"  (1.6 m)  Weight: 159 lb (72.122 kg)    GEN- The patient is elderly appearing, alert and oriented x 3 today.   Head- normocephalic, atraumatic Eyes-  Sclera clear, conjunctiva pink Ears- hearing intact Oropharynx- clear Neck- supple  Lungs- Clear to ausculation bilaterally, normal work of breathing Chest- pacemaker pocket is well healed Heart- Regular rate and rhythm, no murmurs, rubs or gallops, PMI not laterally displaced GI- soft, NT, ND, + BS Extremities- no clubbing, cyanosis, or edema MS- no significant deformity or atrophy Skin- no rash or lesion Psych- euthymic mood, full affect Neuro- strength and sensation are intact  Pacemaker interrogation- reviewed in detail today,  See PACEART report  Assessment and Plan:  1. Sick sinus syndrome Normal pacemaker function See Pace Art report No changes today  2. Atrial fibrillation Maintaining sinus rhythm with amiodarone Not a candidate for anticoagulation due to prior ICH on xarelto  3. HTN Stable No change required today  carelink Return to see Cecille Rubin in 6 months, we need LFTs/TFTs at that time I will see in a year

## 2013-10-19 NOTE — Patient Instructions (Addendum)
Remote monitoring is used to monitor your Pacemaker of ICD from home. This monitoring reduces the number of office visits required to check your device to one time per year. It allows [place] to keep an eye on the functioning of your device to ensure it is working properly. You are scheduled for a device check from home on January 18, 2014. You may send your transmission at any time that day. If you have a wireless device, the transmission will be sent automatically. After your physician reviews your transmission, you will receive a postcard with your next transmission date.  Your physician wants you to follow-up in: 1 year with Dr Rayann Heman.  You will receive a reminder letter in the mail two months in advance. If you don't receive a letter, please call our office to schedule the follow-up appointment.  For [employer] -- send a transmission when you receive your box and then a transmission 1 month later - both of these are free of charge and are just to evaluate ease of use with the system.

## 2013-10-20 DIAGNOSIS — I69920 Aphasia following unspecified cerebrovascular disease: Secondary | ICD-10-CM | POA: Diagnosis not present

## 2013-10-20 DIAGNOSIS — I4891 Unspecified atrial fibrillation: Secondary | ICD-10-CM | POA: Diagnosis not present

## 2013-10-20 DIAGNOSIS — Z950 Presence of cardiac pacemaker: Secondary | ICD-10-CM | POA: Diagnosis not present

## 2013-10-20 DIAGNOSIS — I2510 Atherosclerotic heart disease of native coronary artery without angina pectoris: Secondary | ICD-10-CM | POA: Diagnosis not present

## 2013-10-20 DIAGNOSIS — IMO0001 Reserved for inherently not codable concepts without codable children: Secondary | ICD-10-CM | POA: Diagnosis not present

## 2013-10-20 DIAGNOSIS — I10 Essential (primary) hypertension: Secondary | ICD-10-CM | POA: Diagnosis not present

## 2013-10-20 LAB — MDC_IDC_ENUM_SESS_TYPE_INCLINIC
Battery Voltage: 2.78 V
Brady Statistic AS VP Percent: 0.1 %
Lead Channel Pacing Threshold Amplitude: 1 V
Lead Channel Pacing Threshold Pulse Width: 0.4 ms
Lead Channel Pacing Threshold Pulse Width: 0.76 ms
MDC IDC MSMT LEADCHNL RA IMPEDANCE VALUE: 540 Ohm
MDC IDC MSMT LEADCHNL RA PACING THRESHOLD AMPLITUDE: 0.5 V
MDC IDC MSMT LEADCHNL RV IMPEDANCE VALUE: 476 Ohm
MDC IDC MSMT LEADCHNL RV SENSING INTR AMPL: 11.2 mV
MDC IDC STAT BRADY AP VP PERCENT: 0.1 %
MDC IDC STAT BRADY AP VS PERCENT: 99.9 %
MDC IDC STAT BRADY AS VS PERCENT: 0.1 %

## 2013-10-26 DIAGNOSIS — I2510 Atherosclerotic heart disease of native coronary artery without angina pectoris: Secondary | ICD-10-CM | POA: Diagnosis not present

## 2013-10-26 DIAGNOSIS — IMO0001 Reserved for inherently not codable concepts without codable children: Secondary | ICD-10-CM | POA: Diagnosis not present

## 2013-10-26 DIAGNOSIS — I69920 Aphasia following unspecified cerebrovascular disease: Secondary | ICD-10-CM | POA: Diagnosis not present

## 2013-10-26 DIAGNOSIS — I4891 Unspecified atrial fibrillation: Secondary | ICD-10-CM | POA: Diagnosis not present

## 2013-10-26 DIAGNOSIS — I10 Essential (primary) hypertension: Secondary | ICD-10-CM | POA: Diagnosis not present

## 2013-10-26 DIAGNOSIS — Z950 Presence of cardiac pacemaker: Secondary | ICD-10-CM | POA: Diagnosis not present

## 2013-10-27 ENCOUNTER — Encounter: Payer: Self-pay | Admitting: Internal Medicine

## 2013-10-28 DIAGNOSIS — I10 Essential (primary) hypertension: Secondary | ICD-10-CM | POA: Diagnosis not present

## 2013-10-28 DIAGNOSIS — Z950 Presence of cardiac pacemaker: Secondary | ICD-10-CM | POA: Diagnosis not present

## 2013-10-28 DIAGNOSIS — I4891 Unspecified atrial fibrillation: Secondary | ICD-10-CM | POA: Diagnosis not present

## 2013-10-28 DIAGNOSIS — I2510 Atherosclerotic heart disease of native coronary artery without angina pectoris: Secondary | ICD-10-CM | POA: Diagnosis not present

## 2013-10-28 DIAGNOSIS — I69920 Aphasia following unspecified cerebrovascular disease: Secondary | ICD-10-CM | POA: Diagnosis not present

## 2013-10-28 DIAGNOSIS — IMO0001 Reserved for inherently not codable concepts without codable children: Secondary | ICD-10-CM | POA: Diagnosis not present

## 2013-10-31 DIAGNOSIS — I4891 Unspecified atrial fibrillation: Secondary | ICD-10-CM | POA: Diagnosis not present

## 2013-10-31 DIAGNOSIS — IMO0001 Reserved for inherently not codable concepts without codable children: Secondary | ICD-10-CM | POA: Diagnosis not present

## 2013-10-31 DIAGNOSIS — I10 Essential (primary) hypertension: Secondary | ICD-10-CM | POA: Diagnosis not present

## 2013-10-31 DIAGNOSIS — I69920 Aphasia following unspecified cerebrovascular disease: Secondary | ICD-10-CM | POA: Diagnosis not present

## 2013-10-31 DIAGNOSIS — I2510 Atherosclerotic heart disease of native coronary artery without angina pectoris: Secondary | ICD-10-CM | POA: Diagnosis not present

## 2013-10-31 DIAGNOSIS — Z950 Presence of cardiac pacemaker: Secondary | ICD-10-CM | POA: Diagnosis not present

## 2013-11-03 DIAGNOSIS — IMO0001 Reserved for inherently not codable concepts without codable children: Secondary | ICD-10-CM | POA: Diagnosis not present

## 2013-11-03 DIAGNOSIS — I2510 Atherosclerotic heart disease of native coronary artery without angina pectoris: Secondary | ICD-10-CM | POA: Diagnosis not present

## 2013-11-03 DIAGNOSIS — I10 Essential (primary) hypertension: Secondary | ICD-10-CM | POA: Diagnosis not present

## 2013-11-03 DIAGNOSIS — I69920 Aphasia following unspecified cerebrovascular disease: Secondary | ICD-10-CM | POA: Diagnosis not present

## 2013-11-03 DIAGNOSIS — I4891 Unspecified atrial fibrillation: Secondary | ICD-10-CM | POA: Diagnosis not present

## 2013-11-03 DIAGNOSIS — Z950 Presence of cardiac pacemaker: Secondary | ICD-10-CM | POA: Diagnosis not present

## 2013-11-07 DIAGNOSIS — I10 Essential (primary) hypertension: Secondary | ICD-10-CM | POA: Diagnosis not present

## 2013-11-07 DIAGNOSIS — I4891 Unspecified atrial fibrillation: Secondary | ICD-10-CM | POA: Diagnosis not present

## 2013-11-07 DIAGNOSIS — Z950 Presence of cardiac pacemaker: Secondary | ICD-10-CM | POA: Diagnosis not present

## 2013-11-07 DIAGNOSIS — I69920 Aphasia following unspecified cerebrovascular disease: Secondary | ICD-10-CM | POA: Diagnosis not present

## 2013-11-07 DIAGNOSIS — I2510 Atherosclerotic heart disease of native coronary artery without angina pectoris: Secondary | ICD-10-CM | POA: Diagnosis not present

## 2013-11-07 DIAGNOSIS — IMO0001 Reserved for inherently not codable concepts without codable children: Secondary | ICD-10-CM | POA: Diagnosis not present

## 2013-11-09 DIAGNOSIS — I69920 Aphasia following unspecified cerebrovascular disease: Secondary | ICD-10-CM | POA: Diagnosis not present

## 2013-11-09 DIAGNOSIS — Z950 Presence of cardiac pacemaker: Secondary | ICD-10-CM | POA: Diagnosis not present

## 2013-11-09 DIAGNOSIS — I4891 Unspecified atrial fibrillation: Secondary | ICD-10-CM | POA: Diagnosis not present

## 2013-11-09 DIAGNOSIS — IMO0001 Reserved for inherently not codable concepts without codable children: Secondary | ICD-10-CM | POA: Diagnosis not present

## 2013-11-09 DIAGNOSIS — I10 Essential (primary) hypertension: Secondary | ICD-10-CM | POA: Diagnosis not present

## 2013-11-09 DIAGNOSIS — I2510 Atherosclerotic heart disease of native coronary artery without angina pectoris: Secondary | ICD-10-CM | POA: Diagnosis not present

## 2013-11-14 DIAGNOSIS — IMO0001 Reserved for inherently not codable concepts without codable children: Secondary | ICD-10-CM | POA: Diagnosis not present

## 2013-11-14 DIAGNOSIS — I69920 Aphasia following unspecified cerebrovascular disease: Secondary | ICD-10-CM | POA: Diagnosis not present

## 2013-11-14 DIAGNOSIS — Z950 Presence of cardiac pacemaker: Secondary | ICD-10-CM | POA: Diagnosis not present

## 2013-11-14 DIAGNOSIS — I4891 Unspecified atrial fibrillation: Secondary | ICD-10-CM | POA: Diagnosis not present

## 2013-11-14 DIAGNOSIS — I2510 Atherosclerotic heart disease of native coronary artery without angina pectoris: Secondary | ICD-10-CM | POA: Diagnosis not present

## 2013-11-14 DIAGNOSIS — I10 Essential (primary) hypertension: Secondary | ICD-10-CM | POA: Diagnosis not present

## 2013-11-16 DIAGNOSIS — I10 Essential (primary) hypertension: Secondary | ICD-10-CM | POA: Diagnosis not present

## 2013-11-16 DIAGNOSIS — Z950 Presence of cardiac pacemaker: Secondary | ICD-10-CM | POA: Diagnosis not present

## 2013-11-16 DIAGNOSIS — I4891 Unspecified atrial fibrillation: Secondary | ICD-10-CM | POA: Diagnosis not present

## 2013-11-16 DIAGNOSIS — I69920 Aphasia following unspecified cerebrovascular disease: Secondary | ICD-10-CM | POA: Diagnosis not present

## 2013-11-16 DIAGNOSIS — IMO0001 Reserved for inherently not codable concepts without codable children: Secondary | ICD-10-CM | POA: Diagnosis not present

## 2013-11-16 DIAGNOSIS — I2510 Atherosclerotic heart disease of native coronary artery without angina pectoris: Secondary | ICD-10-CM | POA: Diagnosis not present

## 2013-11-24 ENCOUNTER — Encounter: Payer: Medicare Other | Admitting: *Deleted

## 2013-11-25 ENCOUNTER — Telehealth: Payer: Self-pay | Admitting: Cardiology

## 2013-11-25 NOTE — Telephone Encounter (Signed)
LMOVM reminding pt to send remote transmission.   

## 2013-11-28 ENCOUNTER — Encounter: Payer: Self-pay | Admitting: Cardiology

## 2013-12-12 ENCOUNTER — Encounter: Payer: Self-pay | Admitting: Nurse Practitioner

## 2013-12-12 ENCOUNTER — Ambulatory Visit (INDEPENDENT_AMBULATORY_CARE_PROVIDER_SITE_OTHER): Payer: Medicare Other | Admitting: Nurse Practitioner

## 2013-12-12 VITALS — BP 127/67 | HR 80 | Wt 164.5 lb

## 2013-12-12 DIAGNOSIS — G459 Transient cerebral ischemic attack, unspecified: Secondary | ICD-10-CM | POA: Diagnosis not present

## 2013-12-12 DIAGNOSIS — E785 Hyperlipidemia, unspecified: Secondary | ICD-10-CM | POA: Diagnosis not present

## 2013-12-12 DIAGNOSIS — Z6829 Body mass index (BMI) 29.0-29.9, adult: Secondary | ICD-10-CM | POA: Diagnosis not present

## 2013-12-12 DIAGNOSIS — R4701 Aphasia: Secondary | ICD-10-CM

## 2013-12-12 DIAGNOSIS — R569 Unspecified convulsions: Secondary | ICD-10-CM | POA: Diagnosis not present

## 2013-12-12 DIAGNOSIS — I509 Heart failure, unspecified: Secondary | ICD-10-CM | POA: Diagnosis not present

## 2013-12-12 NOTE — Progress Notes (Signed)
PATIENT: Ann Kaufman DOB: [birth year]  REASON FOR VISIT: routine follow up for ICH HISTORY FROM: patient  HISTORY OF PRESENT ILLNESS: LULAMAE Kaufman is an 80 y.o. female with a history of a ICH on 05/18/2013 while on Xarelto for atrial fibrillation. Lives independently @ baseline. Brought by EMS to [facility] ED as code stroke and seizure. Intubated due to AMS with compromised airway and resp distress. CT head revealed Left frontal lobe intraparenchymal hematoma with intraventricular hemorrhage within the left lateral ventricle as above. Hemorrhagic infarct with intraventricular extension is suspected. No midline shift or hydrocephalus identified. Carotid Duplex Findings suggested 1-39% internal carotid artery stenosis bilaterally. She was with her daughter on 07/24/12 and while the two were walking her daughter noted neurological changes. Patient has a mild aphasia at baseline. Acutely though the patient became unable to speak and was having extensive difficulty getting her words out. Was also noted to have right upper extremity weakness as well. Patient remained ambulatory. Was brought to [facility] where a code stroke was called. Daughter reports that the patient's symptoms lasted about an hour and then resolved completely.  Today is her first visit to the office post-hospital. She is feeling well, back to living independently, with no residual deficits other than mild aphasia, which she had at baseline. BP is well controlled, it is 126/76 in office today. She is tolerating daily aspirin well without side effects, and is tolerating Keppra BID without complication. She has had no recurrent seizures. She has had a couple instances with irregularity in her peripheral vision, which resolved within moments. She has an eye evaluation appointment in 2 weeks.   UPDATE 12/12/13 (LL): Since last visit, patient is doing well.  Her eye evaluation was normal after last visit. She is tolerating daily aspirin well without side  effects, and is tolerating Keppra BID without complication. She has had no recurrent seizures.   Blood pressure is well controlled, it is 127/67 in the office today.  REVIEW OF SYSTEMS: Full 14 system review of systems performed and notable only WIO:[id number] weight change, neck pain, leg swelling, coordination problem, headache, bruise easily    ALLERGIES: Allergies  Allergen Reactions  . Latex     HOME MEDICATIONS: Outpatient Prescriptions Prior to Visit  Medication Sig Dispense Refill  . ALPRAZolam (XANAX) 0.25 MG tablet Take 0.25 mg by mouth at bedtime as needed for anxiety.      Marland Kitchen amiodarone (PACERONE) 200 MG tablet Take 1 tablet (200 mg total) by mouth every morning.  30 tablet  1  . aspirin EC 81 MG EC tablet Take 1 tablet (81 mg total) by mouth daily.      . famotidine (PEPCID) 20 MG tablet Take 1 tablet (20 mg total) by mouth 2 (two) times daily.      . folic acid (FOLVITE) 268 MCG tablet Take 400 mcg by mouth every evening.      . levETIRAcetam (KEPPRA) 500 MG tablet Take 1 tablet (500 mg total) by mouth 2 (two) times daily.  180 tablet  3  . metoprolol tartrate (LOPRESSOR) 25 MG tablet Take 1 tablet (25 mg total) by mouth 2 (two) times daily.  60 tablet  1  . omeprazole (PRILOSEC) 20 MG capsule Take 1 capsule (20 mg total) by mouth every morning.  30 capsule  1  . senna (SENOKOT) 8.6 MG TABS tablet Take 2 tablets (17.2 mg total) by mouth at bedtime.  120 each  0  . sertraline (ZOLOFT) 50 MG tablet  Take 1 tablet (50 mg total) by mouth at bedtime.  90 tablet  3  . simvastatin (ZOCOR) 10 MG tablet Take 1 tablet (10 mg total) by mouth every evening.  15 tablet  0  . vitamin B-12 (CYANOCOBALAMIN) 1000 MCG tablet Take 1 tablet (1,000 mcg total) by mouth every evening.  30 tablet  1  . Vitamin D, Ergocalciferol, (DRISDOL) 50000 UNITS CAPS capsule Take 1 capsule (50,000 Units total) by mouth every 14 (fourteen) days.  4 capsule  1   No facility-administered medications prior to  visit.     PHYSICAL EXAM  Filed Vitals:   12/12/13 1425  BP: 127/67  Pulse: 80  Weight: 164 lb 8 oz (74.617 kg)   Body mass index is 29.15 kg/(m^2). No exam data present  Generalized: Well developed, in no acute distress  Head: normocephalic and atraumatic. Oropharynx benign  Neck: Supple, no carotid bruits  Cardiac: Regular rate rhythm, no murmur  Musculoskeletal: No deformity   Neurological examination  Mentation: Alert oriented to time, place, history taking. Follows all commands speech and language with mild expressive aphasia and word-finding difficulties.  Cranial nerve II-XII: Pupils were equal round reactive to light extraocular movements were full, visual field were full on confrontational test. Facial sensation and strength were normal. hearing was intact to finger rubbing bilaterally. Uvula tongue midline. head turning and shoulder shrug and were normal and symmetric.Tongue protrusion into cheek strength was normal.  Motor: The motor testing reveals 5 over 5 strength of all 4 extremities. Good symmetric motor tone is noted throughout.  Sensory: Sensory testing is intact to pinprick, soft touch, vibration sensation, and position sense on all 4 extremities. No evidence of extinction is noted.  Coordination: Cerebellar testing reveals good finger-nose-finger and heel-to-shin bilaterally.  Gait and station: Gait is normal. Tandem gait is normal. Romberg is negative. No drift is seen.  Reflexes: Deep tendon reflexes are symmetric and normal bilaterally. Toes are downgoing bilaterally.   ASSESSMENT AND PLAN Ann Kaufman is a 80 y.o. female presented with a left frontal intracranial hemorrhage on 05/18/13. The patient was on Xarelto prior to admission. Seizure at the time of presentation. She later presented with aphasia and RUE hemiparesis that resolved on 07/24/12. Left brain infarct vs TIA. Imaging confirmation not possible due to pacemaker. Regardless, it would not change  treatment. Infarct or TIA felt to be embolic secondary to known atrial fibrillation.   PLAN:  Continue aspirin 81 mg orally every day for secondary stroke prevention and maintain strict control of hypertension with blood pressure goal below 130/90, and lipids with LDL cholesterol goal below 100 mg/dL. Continue Keppra for seizure prevention. Followup in the future in 6 months, sooner as needed.  Philmore Pali, MSN, NP-C 12/12/2013, 2:50 PM [facility] [address] [place], [place] [address] [phone]  Note: This document was prepared with digital dictation and possible smart phrase technology. Any transcriptional errors that result from this process are unintentional.

## 2013-12-12 NOTE — Patient Instructions (Addendum)
Continue aspirin 81 mg orally every day for secondary stroke prevention and maintain strict control of hypertension with blood pressure goal below 130/90, and lipids with LDL cholesterol goal below 100 mg/dL. Continue Keppra for seizure prevention.  Followup in the future in 6 months, sooner as needed.

## 2013-12-21 ENCOUNTER — Encounter: Payer: Self-pay | Admitting: Internal Medicine

## 2013-12-22 ENCOUNTER — Other Ambulatory Visit: Payer: Self-pay

## 2013-12-22 MED ORDER — AMIODARONE HCL 200 MG PO TABS: 200.0000 mg | ORAL_TABLET | Freq: Every morning | ORAL | Status: DC

## 2013-12-23 NOTE — Progress Notes (Signed)
I agree with above 

## 2013-12-26 MED ORDER — AMIODARONE HCL 200 MG PO TABS: 200.0000 mg | ORAL_TABLET | Freq: Every day | ORAL | Status: DC

## 2013-12-26 NOTE — Telephone Encounter (Signed)
Refill amiodarone per patient request, she has seen Dr Rayann Heman.

## 2013-12-29 ENCOUNTER — Telehealth: Payer: Self-pay | Admitting: *Deleted

## 2013-12-29 NOTE — Telephone Encounter (Signed)
I relayed to pt she is ok to take tylenol for her headaches.  (use per directions).

## 2014-01-18 ENCOUNTER — Encounter: Payer: Medicare Other | Admitting: *Deleted

## 2014-01-18 ENCOUNTER — Telehealth: Payer: Self-pay | Admitting: Cardiology

## 2014-01-18 NOTE — Telephone Encounter (Signed)
LMOVM reminding pt to send remote transmission.   

## 2014-01-25 ENCOUNTER — Ambulatory Visit (INDEPENDENT_AMBULATORY_CARE_PROVIDER_SITE_OTHER): Payer: Medicare Other | Admitting: *Deleted

## 2014-01-25 DIAGNOSIS — I495 Sick sinus syndrome: Secondary | ICD-10-CM | POA: Diagnosis not present

## 2014-01-25 DIAGNOSIS — I4891 Unspecified atrial fibrillation: Secondary | ICD-10-CM | POA: Diagnosis not present

## 2014-01-25 DIAGNOSIS — I480 Paroxysmal atrial fibrillation: Secondary | ICD-10-CM

## 2014-01-26 NOTE — Telephone Encounter (Signed)
Noted  

## 2014-01-31 NOTE — Progress Notes (Signed)
Remote pacemaker transmission.   

## 2014-02-14 LAB — MDC_IDC_ENUM_SESS_TYPE_REMOTE
Battery Impedance: 230 Ohm
Battery Voltage: 2.78 V
Brady Statistic AP VP Percent: 0 %
Brady Statistic AP VS Percent: 100 %
Brady Statistic AS VS Percent: 0 %
Date Time Interrogation Session: 20150805235218
Lead Channel Impedance Value: 472 Ohm
Lead Channel Pacing Threshold Amplitude: 1.25 V
Lead Channel Pacing Threshold Pulse Width: 0.4 ms
Lead Channel Pacing Threshold Pulse Width: 0.4 ms
Lead Channel Setting Pacing Amplitude: 1.5 V
Lead Channel Setting Pacing Amplitude: 2.5 V
Lead Channel Setting Pacing Pulse Width: 0.4 ms
Lead Channel Setting Sensing Sensitivity: 2 mV
MDC IDC MSMT BATTERY REMAINING LONGEVITY: 111 mo
MDC IDC MSMT LEADCHNL RA IMPEDANCE VALUE: 486 Ohm
MDC IDC MSMT LEADCHNL RA PACING THRESHOLD AMPLITUDE: 0.5 V
MDC IDC MSMT LEADCHNL RV SENSING INTR AMPL: 5.6 mV
MDC IDC STAT BRADY AS VP PERCENT: 0 %

## 2014-02-21 ENCOUNTER — Encounter: Payer: Self-pay | Admitting: Cardiology

## 2014-02-23 ENCOUNTER — Encounter: Payer: Self-pay | Admitting: Cardiology

## 2014-03-02 ENCOUNTER — Encounter: Payer: Self-pay | Admitting: Internal Medicine

## 2014-03-20 ENCOUNTER — Encounter: Payer: Medicare Other | Attending: Physical Medicine & Rehabilitation | Admitting: Physical Medicine & Rehabilitation

## 2014-03-20 ENCOUNTER — Encounter: Payer: Self-pay | Admitting: Physical Medicine & Rehabilitation

## 2014-03-20 VITALS — BP 126/67 | HR 83 | Resp 14 | Wt 166.6 lb

## 2014-03-20 DIAGNOSIS — I69928 Other speech and language deficits following unspecified cerebrovascular disease: Secondary | ICD-10-CM | POA: Diagnosis not present

## 2014-03-20 DIAGNOSIS — M542 Cervicalgia: Secondary | ICD-10-CM | POA: Insufficient documentation

## 2014-03-20 DIAGNOSIS — R569 Unspecified convulsions: Secondary | ICD-10-CM | POA: Diagnosis not present

## 2014-03-20 DIAGNOSIS — I619 Nontraumatic intracerebral hemorrhage, unspecified: Secondary | ICD-10-CM | POA: Diagnosis not present

## 2014-03-20 DIAGNOSIS — I6350 Cerebral infarction due to unspecified occlusion or stenosis of unspecified cerebral artery: Secondary | ICD-10-CM

## 2014-03-20 DIAGNOSIS — IMO0001 Reserved for inherently not codable concepts without codable children: Secondary | ICD-10-CM | POA: Diagnosis not present

## 2014-03-20 DIAGNOSIS — M47817 Spondylosis without myelopathy or radiculopathy, lumbosacral region: Secondary | ICD-10-CM | POA: Insufficient documentation

## 2014-03-20 DIAGNOSIS — I618 Other nontraumatic intracerebral hemorrhage: Secondary | ICD-10-CM

## 2014-03-20 DIAGNOSIS — I69998 Other sequelae following unspecified cerebrovascular disease: Secondary | ICD-10-CM | POA: Diagnosis present

## 2014-03-20 DIAGNOSIS — M7918 Myalgia, other site: Secondary | ICD-10-CM

## 2014-03-20 MED ORDER — POTASSIUM CHLORIDE CRYS ER 20 MEQ PO TBCR: 20.0000 meq | EXTENDED_RELEASE_TABLET | Freq: Every day | ORAL | Status: DC

## 2014-03-20 NOTE — Patient Instructions (Signed)
PLEASE CALL ME WITH ANY PROBLEMS OR QUESTIONS (#[phone]).      

## 2014-03-20 NOTE — Progress Notes (Signed)
Subjective:    Patient ID: Ann Kaufman, female    DOB: 09/24/1929, 80 y.o.   MRN: [id number]  HPI  Ann Kaufman is back regarding her ICH. It has been a while since she's been in therapy. I believe she last had SLP probably in April of this past year. However, she is still finding that she is finding have word finding deficits and some comprehension issues.   Ann Kaufman is doing some basic home exercises. She had one fall when she tripped taking the trash out. Otherwise she denies loss of balance.   She is complaining of recurrent headaches, originating typically in her neck   Additionally, she has some dyspnea with exertion.    Pain Inventory Average Pain 5 Pain Right Now 5 My pain is dull  In the last 24 hours, has pain interfered with the following? General activity 1 Relation with others 1 Enjoyment of life 1 What TIME of day is your pain at its worst? night Sleep (in general) Good  Pain is worse with: walking and some activites Pain improves with: rest and heat/ice Relief from Meds: no pain med  Mobility walk without assistance how many minutes can you walk? 15 ability to climb steps?  yes do you drive?  no  Function retired  Neuro/Psych weakness tremor tingling dizziness confusion depression  Prior Studies Any changes since last visit?  no  Physicians involved in your care Any changes since last visit?  no   Family History  Problem Relation Age of Onset  . Other      negative for premature CAD.  Marland Kitchen Aneurysm Father    History   Social History  . Marital Status: Married    Spouse Name: paul    Number of Children: 3  . Years of Education: 12   Occupational History  . retired    Social History Main Topics  . Smoking status: Former Smoker    Quit date: 11/26/1968  . Smokeless tobacco: None  . Alcohol Use: Yes  . Drug Use: No  . Sexual Activity: None   Other Topics Concern  . None   Social History Narrative   Lives in [place] by  herself.  Daughter lives in [place].   Past Surgical History  Procedure Laterality Date  . Pacemaker insertion      MDT implanted at [facility]  . Abdominal hysterectomy    . Breast lumpectomy    . Appendectomy    . Bowel resection    . Lead revision  09/17/11    s/p atrial lead revision   Past Medical History  Diagnosis Date  . Intracerebral hemorrhage     a. 04/2013 in setting of xarelto therapy.  . Atrial fibrillation 2012    a. Dx in 2012;  b. Rhythm controlled - amiodarone, previously anticoagulated with xarelto (d/c'd 04/2013 in setting of [place]);  b. 04/2013 Echo: EF 55-60%, Gr 1 DD, mild MR, mod dil LA, PAsP 73mmhg  . Hypertension   . Depression   . Arthritis   . Presence of permanent cardiac pacemaker     a. 2012 MDT, placed in [place] (probable tachy-brady).  . Pacemaker   . SSS (sick sinus syndrome)     s/p dual chamber pacemaker  . PAF (paroxysmal atrial fibrillation)   . Aphasia     Occasional aphasia & facial numbness from a left frontal lobe intercerebral hemorrhage on Xarelto on 05/18/13.  Marland Kitchen Pericardial effusion   . Mitral valve prolapse   . Hyperlipidemia   .  Rheumatoid arthritis   . SOB (shortness of breath)     Occasional SOB  . Palpitations     Occasional.  . Peripheral edema     Because she has been standing on her feet more than usual    BP 126/67  Pulse 83  Resp 14  Wt 166 lb 9.6 oz (75.569 kg)  SpO2 97%  Opioid Risk Score:   Fall Risk Score: Moderate Fall Risk (6-13 points) (educated and given handout on fall prevention in the home)  Review of Systems  Constitutional: Positive for unexpected weight change.  Respiratory: Positive for shortness of breath.   Genitourinary: Positive for difficulty urinating.  Skin: Positive for rash.  Neurological: Positive for dizziness, tremors and weakness.       Tingling  Hematological: Bruises/bleeds easily.  Psychiatric/Behavioral: Positive for confusion and dysphoric mood.  All other systems reviewed and are  negative.      Objective:   Physical Exam    Constitutional: She is oriented to person, place, and time. She appears well-developed and well-nourished.  HENT:  Head: Normocephalic and atraumatic.  Eyes: Conjunctivae and EOM are normal. Pupils are equal, round, and reactive to light. Right eye exhibits no discharge. Left eye exhibits no discharge. No scleral icterus.  Neck: Normal range of motion. Neck supple.  Cardiovascular: Normal rate and regular rhythm. Exam reveals no gallop and no friction rub.  No murmur heard. No obvious edema on exam. Respiratory: Effort normal. No respiratory distress. She has no wheezes.  GI: Soft. Bowel sounds are normal. She exhibits no distension. There is no tenderness.  Musculoskeletal: thoracic kyphosis, head forward position. Pain with palpation around the right c3-4 area. Tenderness/taut bands along upper right trap and posterior SCM. Neurological: She is drowsy, distracted, confused. Limited insight and awareness Right facial weakness improved. Speech clearer. Language is less apraxic. Continued word finding deficits. Comprehension is fair to good.  Extremely alert. Follows simple commands. Improved insight and awareness. Oriented to place, month, year with cues. Moves all 4's fairly equally now  Gait is improved with a slight posterior lean still present. Tandem gait caused a little balance loss to the left.  Skin: Skin is warm and dry.  A few limb bruises noted  Psychiatric: pleasant, appropriate, carried a conversation despite language issues. Good insight and awareness overall   Assessment/Plan:  1. Functional deficits secondary to left frontal hematoma with seizure activity  ----still having word finding and occasional receptive deficits.  - made a referral to outpt SLP to revisit and to work on hep.   2. Lumbar spondylosis--lidoderm patches prn, tylenol,much improved  3.Cervicalgia---myofascial pain, underlying spondylosis potentially as  well---triggers headaches  -xrays of neck  -make referral to outpt PT to addres ROM, posture, modalities   -reviewed posture with patient today 5. CV: increasing sob with exertion---no outward fluid overload on exam  -recommend f/u with cards for re-assess  -recommend resuming full dose lasix  7. Seizure: on keppra bid   Follow up with me in 2 months. Thirty minutes of face to face patient care time were spent during this visit. All questions were encouraged and answered.

## 2014-03-30 ENCOUNTER — Ambulatory Visit: Payer: Medicare Other | Admitting: Physical Therapy

## 2014-03-30 ENCOUNTER — Telehealth: Payer: Self-pay | Admitting: Neurology

## 2014-03-30 NOTE — Telephone Encounter (Signed)
Dr. Leonie Man does not have an earlier appointment. The patient may come in to see Charlott Holler and still follow up with Dr.Sethi in Feb. We can put her on the cancellation list. Please call our office back.

## 2014-03-30 NOTE — Telephone Encounter (Signed)
Patient's daughter Santiago Glad calling to request sooner appointment for patient for issues with dizziness, headaches, and her right eye crossing. Please return call and advise.

## 2014-04-03 ENCOUNTER — Telehealth: Payer: Self-pay | Admitting: *Deleted

## 2014-04-03 NOTE — Telephone Encounter (Signed)
Informed patient of appointment time of 04/06/14 at 8:30 am.

## 2014-04-03 NOTE — Telephone Encounter (Signed)
I called every number listed for patient daughter as well has patient.. I have scheduled a appointment with Dr. Leonie Man for weds @ 8 patient daughter is call in and confirm.

## 2014-04-03 NOTE — Telephone Encounter (Signed)
Agree with above plan. 

## 2014-04-06 ENCOUNTER — Ambulatory Visit: Payer: Medicare Other | Attending: Physical Medicine & Rehabilitation | Admitting: Speech Pathology

## 2014-04-06 ENCOUNTER — Encounter: Payer: Self-pay | Admitting: Neurology

## 2014-04-06 ENCOUNTER — Ambulatory Visit (INDEPENDENT_AMBULATORY_CARE_PROVIDER_SITE_OTHER): Payer: Medicare Other | Admitting: Neurology

## 2014-04-06 VITALS — BP 121/69 | HR 82 | Ht 64.0 in | Wt 170.6 lb

## 2014-04-06 DIAGNOSIS — I639 Cerebral infarction, unspecified: Secondary | ICD-10-CM | POA: Diagnosis not present

## 2014-04-06 DIAGNOSIS — R4701 Aphasia: Secondary | ICD-10-CM | POA: Diagnosis not present

## 2014-04-06 DIAGNOSIS — M542 Cervicalgia: Secondary | ICD-10-CM | POA: Diagnosis not present

## 2014-04-06 DIAGNOSIS — M791 Myalgia: Secondary | ICD-10-CM | POA: Diagnosis not present

## 2014-04-06 DIAGNOSIS — R51 Headache: Secondary | ICD-10-CM

## 2014-04-06 DIAGNOSIS — R269 Unspecified abnormalities of gait and mobility: Secondary | ICD-10-CM | POA: Insufficient documentation

## 2014-04-06 DIAGNOSIS — R519 Headache, unspecified: Secondary | ICD-10-CM | POA: Insufficient documentation

## 2014-04-06 DIAGNOSIS — G44219 Episodic tension-type headache, not intractable: Secondary | ICD-10-CM | POA: Diagnosis not present

## 2014-04-06 MED ORDER — DIVALPROEX SODIUM ER 500 MG PO TB24: 500.0000 mg | ORAL_TABLET | Freq: Every day | ORAL | Status: DC

## 2014-04-06 NOTE — Progress Notes (Signed)
PATIENT: Ann Kaufman, Ann Kaufman  REASON FOR VISIT: routine follow up for ICH HISTORY FROM: patient  HISTORY OF PRESENT ILLNESS: Ann Kaufman is an 80 y.o. female with a history of a ICH on 05/18/2013 while on Xarelto for atrial fibrillation. Lives independently @ baseline. Brought by EMS to [facility] ED as code stroke and seizure. Intubated due to AMS with compromised airway and resp distress. CT head revealed Left frontal lobe intraparenchymal hematoma with intraventricular hemorrhage within the left lateral ventricle as above. Hemorrhagic infarct with intraventricular extension is suspected. No midline shift or hydrocephalus identified. Carotid Duplex Findings suggested 1-39% internal carotid artery stenosis bilaterally. She was with her daughter on 07/24/12 and while the two were walking her daughter noted neurological changes. Patient has a mild aphasia at baseline. Acutely though the patient became unable to speak and was having extensive difficulty getting her words out. Was also noted to have right upper extremity weakness as well. Patient remained ambulatory. Was brought to [facility] where a code stroke was called. Daughter reports that the patient's symptoms lasted about an hour and then resolved completely.  Today is her first visit to the office post-hospital. She is feeling well, back to living independently, with no residual deficits other than mild aphasia, which she had at baseline. BP is well controlled, it is 126/76 in office today. She is tolerating daily aspirin well without side effects, and is tolerating Keppra BID without complication. She has had no recurrent seizures. She has had a couple instances with irregularity in her peripheral vision, which resolved within moments. She has an eye evaluation appointment in 2 weeks.   UPDATE 12/12/13 (LL): Since last visit, patient is doing well.  Her eye evaluation was normal after last visit. She is tolerating daily aspirin well without side  effects, and is tolerating Keppra BID without complication. She has had no recurrent seizures.   Blood pressure is well controlled, it is 127/67 in the office today. Update 04/06/2014 : She returns for followup her last visit 4 months ago. She is a complaint by her daughter. She has new complaint of intermittent headaches. She is a poor historian and the daughter has to fill in most of the details. She has been having intermittent headaches for the last 3 months. These occurred a variable frequency but perhaps at least 2 or 3 times per week. The headaches are mostly in the occipital region but bifrontal also occasionally. She describes this as moderate to severe at 9/10 in severity. Mostly aching in nature but occasionally throbbing. There is not associated nausea ,vomiting  But there is slight light and sound sensitivity and she feels better if she turns down the music and the like. She cannot tell me his physical activity has any relationship with the headache. She does have remote history of migraine headaches in the 1970s but she had outgrown them. She denies any recent fall with head injury though she didn't fall and bruised her leg a month ago. She continues to take Keppra and has not had any breakthrough seizures. Her blood pressure is under good control and is 123/6 for an office today she denies any blurred vision, loss of vision, scalp tenderness, jaw claudication or myalgias or arthralgias. REVIEW OF SYSTEMS: Full 14 system review of systems performed and notable only for: Activity change, runny nose, blurred vision, double vision, bruising, dizziness, headache, speech difficulty, weakness, tremors, joint pain, walking difficulty, decreased concentration and nervousness.    ALLERGIES: Allergies  Allergen  Reactions  . Latex     HOME MEDICATIONS: Outpatient Prescriptions Prior to Visit  Medication Sig Dispense Refill  . ALPRAZolam (XANAX) 0.25 MG tablet Take 0.25 mg by mouth at bedtime as  needed for anxiety.      Marland Kitchen amiodarone (PACERONE) 200 MG tablet Take 1 tablet (200 mg total) by mouth daily.  90 tablet  2  . aspirin EC 81 MG EC tablet Take 1 tablet (81 mg total) by mouth daily.      . famotidine (PEPCID) 20 MG tablet Take 1 tablet (20 mg total) by mouth 2 (two) times daily.      . folic acid (FOLVITE) 426 MCG tablet Take 400 mcg by mouth every evening.      . furosemide (LASIX) 40 MG tablet Take 40 mg by mouth daily.      Marland Kitchen levETIRAcetam (KEPPRA) 500 MG tablet Take 1 tablet (500 mg total) by mouth 2 (two) times daily.  180 tablet  3  . metoprolol tartrate (LOPRESSOR) 25 MG tablet Take 1 tablet (25 mg total) by mouth 2 (two) times daily.  60 tablet  1  . omeprazole (PRILOSEC) 20 MG capsule Take 1 capsule (20 mg total) by mouth every morning.  30 capsule  1  . potassium chloride SA (K-DUR,KLOR-CON) 20 MEQ tablet Take 1 tablet (20 mEq total) by mouth daily.  30 tablet  3  . senna (SENOKOT) 8.6 MG TABS tablet Take 2 tablets (17.2 mg total) by mouth at bedtime.  120 each  0  . sertraline (ZOLOFT) 50 MG tablet Take 1 tablet (50 mg total) by mouth at bedtime.  90 tablet  3  . simvastatin (ZOCOR) 10 MG tablet Take 1 tablet (10 mg total) by mouth every evening.  15 tablet  0  . vitamin B-12 (CYANOCOBALAMIN) 1000 MCG tablet Take 1 tablet (1,000 mcg total) by mouth every evening.  30 tablet  1  . Vitamin D, Ergocalciferol, (DRISDOL) 50000 UNITS CAPS capsule Take 1 capsule (50,000 Units total) by mouth every 14 (fourteen) days.  4 capsule  1   No facility-administered medications prior to visit.     PHYSICAL EXAM  Filed Vitals:   04/06/14 0831 04/06/14 0833 04/06/14 0834 04/06/14 0835  BP: 130/71 130/71 123/64 121/69  Pulse: 80 80 74 82  Height: _0  (1.626 m)     Weight: 170 lb 9.6 oz (77.384 kg)      Body mass index is 29.27 kg/(m^2).  Visual Acuity Screening   Right eye Left eye Both eyes  Without correction: 20/50 20/30   With correction:       Generalized: Well  developed, in no acute distress  Head: normocephalic and atraumatic. Oropharynx benign  Neck: Supple, no carotid bruits  Cardiac: Regular rate rhythm, no murmur  Musculoskeletal: No deformity   Neurological examination  Mentation: Alert oriented to time, place, history taking. Follows all commands speech and language with mild expressive aphasia and word-finding difficulties.  Cranial nerve II-XII: Pupils were equal round reactive to light extraocular movements were full, visual field were full on confrontational test. Facial sensation and strength were normal. hearing was intact to finger rubbing bilaterally. Uvula tongue midline. head turning and shoulder shrug and were normal and symmetric.Tongue protrusion into cheek strength was normal.  Motor: The motor testing reveals 5 over 5 strength of all 4 extremities. Good symmetric motor tone is noted throughout.  Sensory: Sensory testing is intact to pinprick, soft touch, vibration sensation, and position sense on all 4  extremities. No evidence of extinction is noted.  Coordination: Cerebellar testing reveals good finger-nose-finger and heel-to-shin bilaterally.  Gait and station: Gait is normal. Tandem gait is  Slightly abnormal. Romberg is negative. No drift is seen.  Reflexes: Deep tendon reflexes are symmetric and normal bilaterally. Toes are downgoing bilaterally.   ASSESSMENT AND PLAN Ann Kaufman is a 80 y.o. female presented with a left frontal intracranial hemorrhage on 05/18/13. The patient was on Xarelto prior to admission. Seizure at the time of presentation. She later presented with aphasia and RUE hemiparesis that resolved on 07/24/12. Left brain infarct vs TIA. New complaint of posterior headaches likely mixed muscle tension headaches with some migrainous features. PLAN:  I had a long discussion with the patient and her daughter regarding her new episodic headaches which are likely mixed tension headaches with some migrainous  features. Plan start Depakote ER 500 mg daily for headache prophylaxis and continued to use Tylenol for symptomatic relief. I have advised her to do neck stretching exercises. Check CT scan of the head with and without contrast, ESR, CMP and CBC. Continue aspirin 81 mg daily for stroke prevention and maintain strict control of hypertension with blood pressure goal below 130/90. Continue Keppra in the current dose for seizure prophylaxis. Return for followup in 2 months with Charlott Holler, NP or call earlier if necessary  Antony Contras, MD  04/06/2014, 9:06 AM [facility] [address], [place], [place] [address] [phone]  Note: This document was prepared with digital dictation and possible smart phrase technology. Any transcriptional errors that result from this process are unintentional.

## 2014-04-06 NOTE — Patient Instructions (Signed)
I had a long discussion with the patient and her daughter regarding her new episodic headaches which are likely mixed tension headaches with some migrainous features. Plan start Depakote ER 500 mg daily for headache prophylaxis and continued to use Tylenol for symptomatic relief. I have advised her to do neck stretching exercises. Check CT scan of the head with and without contrast, ESR, CMP and CBC. Continue aspirin 81 mg daily for stroke prevention and maintain strict control of hypertension with blood pressure goal below 130/90. Continue Keppra in the current dose for seizure prophylaxis. Return for followup in 2 months with Lynn Lam, NP or call earlier if necessary 

## 2014-04-07 LAB — COMPREHENSIVE METABOLIC PANEL
A/G RATIO: 1.6 (ref 1.1–2.5)
ALT: 13 IU/L (ref 0–32)
AST: 22 IU/L (ref 0–40)
Albumin: 4.2 g/dL (ref 3.5–4.7)
Alkaline Phosphatase: 86 IU/L (ref 39–117)
BUN/Creatinine Ratio: 21 (ref 11–26)
BUN: 25 mg/dL (ref 8–27)
CALCIUM: 9.1 mg/dL (ref 8.7–10.3)
CHLORIDE: 105 mmol/L (ref 96–108)
CO2: 30 mmol/L — ABNORMAL HIGH (ref 18–29)
Creatinine, Ser: 1.19 mg/dL — ABNORMAL HIGH (ref 0.57–1.00)
GFR calc Af Amer: 49 mL/min/{1.73_m2} — ABNORMAL LOW (ref >59)
GFR, EST NON AFRICAN AMERICAN: 43 mL/min/{1.73_m2} — AB (ref >59)
Globulin, Total: 2.6 g/dL (ref 1.5–4.5)
Glucose: 74 mg/dL (ref 65–99)
POTASSIUM: 4.4 mmol/L (ref 3.5–5.2)
SODIUM: 142 mmol/L (ref 134–144)
TOTAL PROTEIN: 6.8 g/dL (ref 6.0–8.5)
Total Bilirubin: 0.3 mg/dL (ref 0.0–1.2)

## 2014-04-07 LAB — CBC
HEMATOCRIT: 35.5 % (ref 34.0–46.6)
HEMOGLOBIN: 12 g/dL (ref 11.1–15.9)
MCH: 26.3 pg — ABNORMAL LOW (ref 26.6–33.0)
MCHC: 33.8 g/dL (ref 31.5–35.7)
MCV: 78 fL — ABNORMAL LOW (ref 79–97)
Platelets: 163 10*3/uL (ref 150–379)
RBC: 4.56 x10E6/uL (ref 3.77–5.28)
RDW: 17.8 % — ABNORMAL HIGH (ref 12.3–15.4)
WBC: 5.4 10*3/uL (ref 3.4–10.8)

## 2014-04-07 LAB — SEDIMENTATION RATE: SED RATE: 4 mm/h (ref 0–40)

## 2014-04-11 ENCOUNTER — Ambulatory Visit
Admission: RE | Admit: 2014-04-11 | Discharge: 2014-04-11 | Disposition: A | Discharge status: Home / Self Care | Payer: Medicare Other | Source: Ambulatory Visit | Attending: Neurology | Admitting: Neurology

## 2014-04-11 ENCOUNTER — Ambulatory Visit
Admission: RE | Admit: 2014-04-11 | Discharge: 2014-04-11 | Disposition: A | Discharge status: Home / Self Care | Payer: Medicare Other | Source: Ambulatory Visit | Attending: Physical Medicine & Rehabilitation | Admitting: Physical Medicine & Rehabilitation

## 2014-04-11 DIAGNOSIS — M542 Cervicalgia: Secondary | ICD-10-CM

## 2014-04-11 DIAGNOSIS — M47812 Spondylosis without myelopathy or radiculopathy, cervical region: Secondary | ICD-10-CM | POA: Diagnosis not present

## 2014-04-11 DIAGNOSIS — G44219 Episodic tension-type headache, not intractable: Secondary | ICD-10-CM

## 2014-04-11 DIAGNOSIS — M5032 Other cervical disc degeneration, mid-cervical region: Secondary | ICD-10-CM | POA: Diagnosis not present

## 2014-04-11 DIAGNOSIS — R51 Headache: Secondary | ICD-10-CM | POA: Diagnosis not present

## 2014-04-11 DIAGNOSIS — M7918 Myalgia, other site: Secondary | ICD-10-CM

## 2014-04-11 MED ORDER — IOHEXOL 300 MG/ML  SOLN
75.0000 mL | Freq: Once | INTRAMUSCULAR | Status: AC | PRN
  Administered 2014-04-11: 75 mL via INTRAVENOUS

## 2014-04-12 ENCOUNTER — Telehealth: Payer: Self-pay | Admitting: Physical Medicine & Rehabilitation

## 2014-04-12 NOTE — Telephone Encounter (Signed)
Let pt/daughter know that xr show mild to moderate DDD at C5-6 and some surrounding generalized wear and tear. There are no ACUTE findings.   Would proceed with therapies. Can discuss further at next visit

## 2014-04-13 ENCOUNTER — Encounter: Payer: Self-pay | Admitting: Nurse Practitioner

## 2014-04-13 ENCOUNTER — Ambulatory Visit (INDEPENDENT_AMBULATORY_CARE_PROVIDER_SITE_OTHER): Payer: Medicare Other | Admitting: Nurse Practitioner

## 2014-04-13 VITALS — BP 132/74 | HR 96 | Ht 64.0 in | Wt 170.8 lb

## 2014-04-13 DIAGNOSIS — I480 Paroxysmal atrial fibrillation: Secondary | ICD-10-CM

## 2014-04-13 DIAGNOSIS — I639 Cerebral infarction, unspecified: Secondary | ICD-10-CM | POA: Diagnosis not present

## 2014-04-13 DIAGNOSIS — I482 Chronic atrial fibrillation, unspecified: Secondary | ICD-10-CM

## 2014-04-13 DIAGNOSIS — Z950 Presence of cardiac pacemaker: Secondary | ICD-10-CM

## 2014-04-13 DIAGNOSIS — I10 Essential (primary) hypertension: Secondary | ICD-10-CM

## 2014-04-13 NOTE — Progress Notes (Signed)
[name] Date of Birth: [birth year] Medical Record #[id number]  History of Present Illness: Ann Kaufman is seen back today for a follow up visit - seen for Dr. Rayann Heman. She is an 80 year old female with sinus bradycardia - has PPM in place, PAF - on amiodarone - no anticoagulation due to prior intracranial hemorrhage while on Xarelto. Other issues include HTN, depression, MVP, HLD, and RA.  Last seen here in April - was doing well.  Comes back today. Here with her friend -  Ann Kaufman. Ann Kaufman is more of a caregiver to Ms. Blairs she is here in the place of her daughter. Seems to have had a good 6 months. Some confusion noted. No chest pain. Short of breath if she really exerts. Sometimes will get dizzy if she stands up too quick. Tells me she is seeing Dr. Josefa Half next month? Tolerating her medicines. Has had recent labs. Mentions to me several times about "when she was tied down". Has had some knee pain. Asking about trying to do more in regards to activity level.   Current Outpatient Prescriptions  Medication Sig Dispense Refill  . ALPRAZolam (XANAX) 0.25 MG tablet Take 0.25 mg by mouth at bedtime as needed for anxiety.      Marland Kitchen amiodarone (PACERONE) 200 MG tablet Take 1 tablet (200 mg total) by mouth daily.  90 tablet  2  . aspirin EC 81 MG EC tablet Take 1 tablet (81 mg total) by mouth daily.      . divalproex (DEPAKOTE ER) 500 MG 24 hr tablet Take 1 tablet (500 mg total) by mouth daily.  60 tablet  1  . famotidine (PEPCID) 20 MG tablet Take 1 tablet (20 mg total) by mouth 2 (two) times daily.      . folic acid (FOLVITE) 614 MCG tablet Take 400 mcg by mouth every evening.      . furosemide (LASIX) 40 MG tablet Take 40 mg by mouth daily.      Marland Kitchen levETIRAcetam (KEPPRA) 500 MG tablet Take 1 tablet (500 mg total) by mouth 2 (two) times daily.  180 tablet  3  . metoprolol tartrate (LOPRESSOR) 25 MG tablet Take 1 tablet (25 mg total) by mouth 2 (two) times daily.  60 tablet  1  . omeprazole  (PRILOSEC) 20 MG capsule Take 1 capsule (20 mg total) by mouth every morning.  30 capsule  1  . potassium chloride SA (K-DUR,KLOR-CON) 20 MEQ tablet Take 1 tablet (20 mEq total) by mouth daily.  30 tablet  3  . senna (SENOKOT) 8.6 MG TABS tablet Take 2 tablets (17.2 mg total) by mouth at bedtime.  120 each  0  . sertraline (ZOLOFT) 50 MG tablet Take 1 tablet (50 mg total) by mouth at bedtime.  90 tablet  3  . simvastatin (ZOCOR) 10 MG tablet Take 1 tablet (10 mg total) by mouth every evening.  15 tablet  0  . vitamin B-12 (CYANOCOBALAMIN) 1000 MCG tablet Take 1 tablet (1,000 mcg total) by mouth every evening.  30 tablet  1  . Vitamin D, Ergocalciferol, (DRISDOL) 50000 UNITS CAPS capsule Take 1 capsule (50,000 Units total) by mouth every 14 (fourteen) days.  4 capsule  1   No current facility-administered medications for this visit.    Allergies  Allergen Reactions  . Latex     Past Medical History  Diagnosis Date  . Intracerebral hemorrhage     a. 04/2013 in setting of xarelto therapy.  Marland Kitchen  Atrial fibrillation 2012    a. Dx in 2012;  b. Rhythm controlled - amiodarone, previously anticoagulated with xarelto (d/c'd 04/2013 in setting of [place]);  b. 04/2013 Echo: EF 55-60%, Gr 1 DD, mild MR, mod dil LA, PAsP 38mmhg  . Hypertension   . Depression   . Arthritis   . Presence of permanent cardiac pacemaker     a. 2012 MDT, placed in [place] (probable tachy-brady).  . Pacemaker   . SSS (sick sinus syndrome)     s/p dual chamber pacemaker  . PAF (paroxysmal atrial fibrillation)   . Aphasia     Occasional aphasia & facial numbness from a left frontal lobe intercerebral hemorrhage on Xarelto on 05/18/13.  Marland Kitchen Pericardial effusion   . Mitral valve prolapse   . Hyperlipidemia   . Rheumatoid arthritis   . SOB (shortness of breath)     Occasional SOB  . Palpitations     Occasional.  . Peripheral edema     Because she has been standing on her feet more than usual     Past Surgical History    Procedure Laterality Date  . Pacemaker insertion      MDT implanted at [facility]  . Abdominal hysterectomy    . Breast lumpectomy    . Appendectomy    . Bowel resection    . Lead revision  09/17/11    s/p atrial lead revision    History  Smoking status  . Former Smoker  . Quit date: 11/26/1968  Smokeless tobacco  . Not on file    History  Alcohol Use  . Yes    Family History  Problem Relation Age of Onset  . Other      negative for premature CAD.  Marland Kitchen Aneurysm Father     Review of Systems: The review of systems is per the HPI.  All other systems were reviewed and are negative.  Physical Exam: BP 132/74  Pulse 96  Ht 5\' 4"  (1.626 m)  Wt 170 lb 12.8 oz (77.474 kg)  BMI 29.30 kg/m2  SpO2 97% Patient is very pleasant and in no acute distress. Skin is warm and dry. Color is normal.  HEENT is unremarkable. Normocephalic/atraumatic. PERRL. Sclera are nonicteric. Neck is supple. No masses. No JVD. Lungs are clear. Cardiac exam shows a regular rate and rhythm. Abdomen is soft. Extremities are without edema. Gait and ROM are intact. No gross neurologic deficits noted.  Wt Readings from Last 3 Encounters:  04/13/14 170 lb 12.8 oz (77.474 kg)  04/06/14 170 lb 9.6 oz (77.384 kg)  03/20/14 166 lb 9.6 oz (75.569 kg)    LABORATORY DATA/PROCEDURES:  Lab Results  Component Value Date   WBC 5.4 04/06/2014   HGB 12.0 04/06/2014   HCT 35.5 04/06/2014   PLT 163 04/06/2014   GLUCOSE 74 04/06/2014   CHOL 178 07/25/2013   TRIG 132 07/25/2013   HDL 63 07/25/2013   LDLCALC 89 07/25/2013   ALT 13 04/06/2014   AST 22 04/06/2014   NA 142 04/06/2014   K 4.4 04/06/2014   CL 105 04/06/2014   CREATININE 1.19* 04/06/2014   BUN 25 04/06/2014   CO2 30* 04/06/2014   TSH 4.468 07/25/2013   INR 1.02 07/24/2013   HGBA1C 6.1* 07/25/2013    BNP (last 3 results) No results found for this basename: PROBNP,  in the last 8760 hours   Echo Study Conclusions from November 2014  - Left ventricle: The  cavity size was normal. There was  mild focal basal hypertrophy of the septum. Systolic function was normal. The estimated ejection fraction was in the range of 55% to 60%. Wall motion was normal; there were no regional wall motion abnormalities. Doppler parameters are consistent with abnormal left ventricular relaxation (grade 1 diastolic dysfunction). - Mitral valve: Mild regurgitation. - Left atrium: The atrium was moderately dilated. - Pulmonary arteries: Systolic pressure was mildly increased. PA peak pressure: 21mm Hg (S). Impressions:  - Possible small, oscillating density (? thrombus; ?vegetation)associated with RA wire vs chiari network; clinical correlation recommended. Since 11/27/11, percardial and pleural effusions resolved.    Assessment / Plan: 1. Underlying PPM  2. PAF - EKG today with A pacing and underlying sinus noted.   3. Past ICH - not a candidate for anticoagulation - she has some residual confusion noted.  Her cardiac status seems stable. See back in 6 months as planned.   Patient is agreeable to this plan and will call if any problems develop in the interim.   Burtis Junes, RN, [place] [address] [place], [place]  [phone]

## 2014-04-13 NOTE — Patient Instructions (Signed)
Stay on your current medicines  See Dr. Allred in 6 months  Call the [facility] office at [phone] if you have any questions, problems or concerns.   

## 2014-04-14 ENCOUNTER — Ambulatory Visit: Payer: Medicare Other | Admitting: Rehabilitative and Restorative Service Providers"

## 2014-04-14 ENCOUNTER — Ambulatory Visit: Payer: Medicare Other

## 2014-04-14 DIAGNOSIS — I639 Cerebral infarction, unspecified: Secondary | ICD-10-CM | POA: Diagnosis not present

## 2014-04-14 DIAGNOSIS — R4701 Aphasia: Secondary | ICD-10-CM | POA: Diagnosis not present

## 2014-04-14 DIAGNOSIS — R269 Unspecified abnormalities of gait and mobility: Secondary | ICD-10-CM | POA: Diagnosis not present

## 2014-04-14 DIAGNOSIS — M791 Myalgia: Secondary | ICD-10-CM | POA: Diagnosis not present

## 2014-04-14 DIAGNOSIS — M542 Cervicalgia: Secondary | ICD-10-CM | POA: Diagnosis not present

## 2014-04-17 NOTE — Telephone Encounter (Signed)
Called pt's daughter Santiago Glad and gave her the message from [place].  Will explore therapies on next OV 05/22/14

## 2014-04-19 ENCOUNTER — Ambulatory Visit: Payer: Medicare Other

## 2014-04-19 ENCOUNTER — Ambulatory Visit: Payer: Medicare Other | Admitting: Rehabilitative and Restorative Service Providers"

## 2014-04-19 DIAGNOSIS — M791 Myalgia: Secondary | ICD-10-CM | POA: Diagnosis not present

## 2014-04-19 DIAGNOSIS — R4701 Aphasia: Secondary | ICD-10-CM | POA: Diagnosis not present

## 2014-04-19 DIAGNOSIS — M542 Cervicalgia: Secondary | ICD-10-CM | POA: Diagnosis not present

## 2014-04-19 DIAGNOSIS — I639 Cerebral infarction, unspecified: Secondary | ICD-10-CM | POA: Diagnosis not present

## 2014-04-19 DIAGNOSIS — R269 Unspecified abnormalities of gait and mobility: Secondary | ICD-10-CM | POA: Diagnosis not present

## 2014-04-21 ENCOUNTER — Ambulatory Visit: Payer: Medicare Other | Admitting: Rehabilitative and Restorative Service Providers"

## 2014-04-21 ENCOUNTER — Ambulatory Visit: Payer: Medicare Other

## 2014-04-21 DIAGNOSIS — R4701 Aphasia: Secondary | ICD-10-CM | POA: Diagnosis not present

## 2014-04-21 DIAGNOSIS — M542 Cervicalgia: Secondary | ICD-10-CM | POA: Diagnosis not present

## 2014-04-21 DIAGNOSIS — R269 Unspecified abnormalities of gait and mobility: Secondary | ICD-10-CM | POA: Diagnosis not present

## 2014-04-21 DIAGNOSIS — M791 Myalgia: Secondary | ICD-10-CM | POA: Diagnosis not present

## 2014-04-21 DIAGNOSIS — I639 Cerebral infarction, unspecified: Secondary | ICD-10-CM | POA: Diagnosis not present

## 2014-04-25 ENCOUNTER — Ambulatory Visit: Payer: Medicare Other | Attending: Physical Medicine & Rehabilitation

## 2014-04-25 DIAGNOSIS — R269 Unspecified abnormalities of gait and mobility: Secondary | ICD-10-CM | POA: Diagnosis not present

## 2014-04-25 DIAGNOSIS — I639 Cerebral infarction, unspecified: Secondary | ICD-10-CM | POA: Insufficient documentation

## 2014-04-25 DIAGNOSIS — R4701 Aphasia: Secondary | ICD-10-CM | POA: Insufficient documentation

## 2014-04-25 DIAGNOSIS — M791 Myalgia: Secondary | ICD-10-CM | POA: Insufficient documentation

## 2014-04-25 DIAGNOSIS — M542 Cervicalgia: Secondary | ICD-10-CM | POA: Insufficient documentation

## 2014-04-25 NOTE — Patient Instructions (Signed)
Piriformis Stretch - Supine   Pull L knee across body toward opposite shoulder. Hold slight stretch for _30__ seconds. Repeat 3___ times. Do _1-2__ times per day.  Copyright  VHI. All rights reserved.

## 2014-04-25 NOTE — Therapy (Signed)
Physical Therapy Treatment  Patient Details  Name: Ann Kaufman MRN: [id number] Date of Birth: [birth year]  Encounter Date: 04/25/2014      PT End of Session - 04/25/14 1600    Visit Number 2   Number of Visits 10   PT Start Time 1446   PT Stop Time 1530   PT Time Calculation (min) 44 min   Equipment Utilized During Treatment Gait belt   Activity Tolerance Patient tolerated treatment well      Past Medical History  Diagnosis Date  . Intracerebral hemorrhage     a. 04/2013 in setting of xarelto therapy.  . Atrial fibrillation 2012    a. Dx in 2012;  b. Rhythm controlled - amiodarone, previously anticoagulated with xarelto (d/c'd 04/2013 in setting of [place]);  b. 04/2013 Echo: EF 55-60%, Gr 1 DD, mild MR, mod dil LA, PAsP 50mmhg  . Hypertension   . Depression   . Arthritis   . Presence of permanent cardiac pacemaker     a. 2012 MDT, placed in [place] (probable tachy-brady).  . Pacemaker   . SSS (sick sinus syndrome)     s/p dual chamber pacemaker  . PAF (paroxysmal atrial fibrillation)   . Aphasia     Occasional aphasia & facial numbness from a left frontal lobe intercerebral hemorrhage on Xarelto on 05/18/13.  Marland Kitchen Pericardial effusion   . Mitral valve prolapse   . Hyperlipidemia   . Rheumatoid arthritis   . SOB (shortness of breath)     Occasional SOB  . Palpitations     Occasional.  . Peripheral edema     Because she has been standing on her feet more than usual     Past Surgical History  Procedure Laterality Date  . Pacemaker insertion      MDT implanted at [facility]  . Abdominal hysterectomy    . Breast lumpectomy    . Appendectomy    . Bowel resection    . Lead revision  09/17/11    s/p atrial lead revision    There were no vitals taken for this visit.  Visit Diagnosis:  Abnormality of gait          Adult PT Treatment/Exercise - 04/25/14 0700    Ambulation/Gait   Ambulation/Gait Yes   Ambulation/Gait Assistance 4: Min guard;5: Supervision   Ambulation/Gait Assistance Details Min guard with pt progressing to supervision during ambulation. VC's decr. occasional scissor gait/narrow BOS. Pt missed 8 playing cards during first trial and then missed 5 cards during the second trial. Pt's shoes slipped off heel during swing, educated pt on wearing tennis shoes for safety.  230'x2, 100', with and without head turns.   Ambulation Distance (Feet) --  230', 200',100'   Assistive device None   Gait Pattern Step-through pattern;Narrow base of support  Decreased B toe off   Stairs Yes   Stairs Assistance 4: Min guard   Stair Management Technique One rail Right;Two rails  step to pattern   Number of Stairs 4   Height of Stairs 4  inches   Exercises   Exercises Knee/Hip   Knee/Hip Exercises: Stretches   Piriformis Stretch 3 reps;30 seconds  Seated and Supine: with L knee to R shoulder.   Piriformis Stretch Limitations Pt reported increased stretch in supine, added to HEP.   Knee/Hip Exercises: Standing   Forward Step Up 3 sets;Left;Step Height: 4";Hand Hold: 0   Forward Step Up Limitations VC's and demo for technique and to improve weight shifting and  decrease hip hike/lateral trunk lean. Performed with CGA and Min A during 1 LOB.   Step Down 3 sets;10 reps;Step Height: 4";Hand Hold: 0   Step Down Limitations VC's and demo for technique and to improve eccentric control. Performed with CGA.          Education - 04/25/14 1600    Education provided Yes   Education Details Patient   Methods Explanation;Demonstration;Tactile cues;Verbal cues;Handout   Comprehension Verbalized understanding;Returned demonstration          PT Short Term Goals - 04/25/14 1609    PT SHORT TERM GOAL #1   Title Pt will be able to: Perform HEP with minimal cueing from PT for accuracy  05/05/14   Time 1   Period Weeks   Status On-going   PT SHORT TERM GOAL #2   Title Pt will be able to: Improve DGI score to 21/24   Time 1   Period Weeks   Status  On-going   PT SHORT TERM GOAL #3   Title Pt will be able to: Further assess gait for community distances to ensure safety with uneven terrain   Time 1   Period Weeks   Status On-going   PT SHORT TERM GOAL #4   Title Pt will be able to: Navigate 4 steps modified independent with reciprocal gait   Time 1   Period Weeks   Status On-going   PT SHORT TERM GOAL #5   Title Pt will be able to: Improve neck lateral flexion to 24 degrees bilaterally.   Time 1   Period Weeks   Status On-going          PT Long Term Goals - 04/25/14 1612    PT LONG TERM GOAL #1   Title Pt will be able to: verbalize understanding of: fall prevention strategies within home environment.  05/25/14   Time 4   Period Weeks   Status On-going   PT LONG TERM GOAL #2   Title Pt will be able to: Perform HEP with independence   Time 4   Period Weeks   Status On-going   PT LONG TERM GOAL #3   Title Pt will be able to: Improve DGI score to 24/24   Time 4   Period Weeks   Status On-going   PT LONG TERM GOAL #4   Title Pt will be able to: Verbalize understanding of home management of tension headaches per stretching, hot/cold modality use as needed.    Time 4   Period Weeks   Status On-going          Plan - 04/25/14 1601    Clinical Impression Statement Pt demonstrated progress towards goals, as she tolerated therex and gait training without pain today. Pt continues to experience LOB while performing step ups and decreased gait speed during dynamic gait activities and would benefit from skilled therapy to improve functional mobility.   Pt will benefit from skilled therapeutic intervention in order to improve on the following deficits Abnormal gait;Decreased balance;Decreased mobility;Decreased coordination;Impaired flexibility;Decreased strength;Pain   Rehab Potential Good   PT Frequency --  2x/week for 4 weeks and then 1x/week for 2 weeks   PT Treatment/Interventions DME instruction;Gait training;Stair  training;Functional mobility training;Therapeutic activities;Therapeutic exercise;Manual techniques;Patient/family education;Neuromuscular re-education;Balance training   PT Plan Progress dynamic gait activities, assess gait outdoors over uneven terrain (unable to assess today due to rain). Manual therapy as needed and balance activities. Standing hip extension.        Problem List  Patient Active Problem List   Diagnosis Date Noted  . Headache 04/06/2014  . Cervicalgia 03/20/2014  . Myofascial muscle pain 03/20/2014  . Sick sinus syndrome 10/19/2013  . TIA (transient ischemic attack) 07/24/2013  . UTI (lower urinary tract infection) 07/24/2013  . Seizures 07/24/2013  . Delirium of mixed origin 06/09/2013  . Altered mental status 06/03/2013  . PAF (paroxysmal atrial fibrillation) 05/30/2013  . Syncope 05/30/2013  . Pacemaker 05/30/2013  . Acute respiratory failure 05/19/2013  . Chronic anticoagulation 05/19/2013  . Aspiration pneumonia 05/19/2013  . ICH (intracerebral hemorrhage) 05/18/2013  . Dyspnea on exertion 04/06/2013  . Atrial fibrillation, chronic 04/06/2013  . Atherosclerosis of aorta 04/06/2013  . Hypokalemia 11/28/2011  . Heart palpitations 11/27/2011  . Pleural effusion 11/27/2011  . Atrial fibrillation with RVR 11/27/2011  . HTN (hypertension) 11/27/2011                                            Tzirel Leonor L 04/25/2014, 4:21 PM

## 2014-04-27 ENCOUNTER — Encounter: Payer: Self-pay | Admitting: *Deleted

## 2014-05-03 ENCOUNTER — Ambulatory Visit: Payer: Medicare Other

## 2014-05-03 DIAGNOSIS — R4701 Aphasia: Secondary | ICD-10-CM

## 2014-05-03 DIAGNOSIS — I639 Cerebral infarction, unspecified: Secondary | ICD-10-CM | POA: Diagnosis not present

## 2014-05-03 NOTE — Therapy (Signed)
Speech Language Pathology Treatment  Patient Details  Name: Ann Kaufman MRN: [id number] Date of Birth: 1929-06-26  Encounter Date: 05/03/2014      End of Session - 05/03/14 1525    Visit Number 5   Number of Visits 16   Date for SLP Re-Evaluation 06/06/14   SLP Start Time 1448   SLP Time Calculation (min) 1524   SLP Time Calculation (min) 36 min   Activity Tolerance Patient tolerated treatment well      Past Medical History  Diagnosis Date  . Intracerebral hemorrhage     a. 04/2013 in setting of xarelto therapy.  . Atrial fibrillation 2012    a. Dx in 2012;  b. Rhythm controlled - amiodarone, previously anticoagulated with xarelto (d/c'd 04/2013 in setting of [place]);  b. 04/2013 Echo: EF 55-60%, Gr 1 DD, mild MR, mod dil LA, PAsP 53mhg  . Hypertension   . Depression   . Arthritis   . Presence of permanent cardiac pacemaker     a. 2012 MDT, placed in [place](probable tachy-brady).  . Pacemaker   . SSS (sick sinus syndrome)     s/p dual chamber pacemaker  . PAF (paroxysmal atrial fibrillation)   . Aphasia     Occasional aphasia & facial numbness from a left frontal lobe intercerebral hemorrhage on Xarelto on 05/18/13.  .Marland KitchenPericardial effusion   . Mitral valve prolapse   . Hyperlipidemia   . Rheumatoid arthritis   . SOB (shortness of breath)     Occasional SOB  . Palpitations     Occasional.  . Peripheral edema     Because she has been standing on her feet more than usual     Past Surgical History  Procedure Laterality Date  . Pacemaker insertion      [facility] Ab[facility]    . Breast lumpectomy    . Appendectomy    . Bowel resection    . Lead revision  09/17/11    s/p atrial lead revision    There were no vitals taken for this visit.  Visit Diagnosis: Aphasia          ADULT SLP TREATMENT - 05/03/14 1451    General Information   Behavior/Cognition Alert;Cooperative;Pleasant mood   Treatment Provided   Treatment  provided Cognitive-Linquistic   Pain Assessment   Pain Assessment 0-10   Pain Score 7    Pain Location lt leg   Pain Descriptors / Indicators Aching   Pain Intervention(s) Monitored during session   Cognitive-Linquistic Treatment   Treatment focused on Aphasia   Skilled Treatment "Before therapy started I stumbled over words - now I think more before I talk and it comes out better" Pt reports her children have told her that they think her language is back at baseline.  Conversation today about pt's old volunteering experiences as well as previous vacations. No notable anomic episodes. Pt reports at least 25% reduction in dysnomic episodes  Pt showed SLP her homework tasks.    Assessment / Recommendations / Plan   Plan --  discharge next visit if progress consistent until then   Progression Toward Goals   Progression toward goals Progressing toward goals  If pt reports cont 25% reduction in anomia, d/c appropriate            SLP Short Term Goals - 05/03/14 1444    SLP SHORT TERM GOAL #1   Title utilize compensations for word finding episodes in structured speech  tasks 80% rare min A   Time 4   Period Weeks   Status Achieved   SLP SHORT TERM GOAL #2   Title complete complex non concrete naming tasks with 85% accuracy occasional min A   Time 4   Period Weeks   Status Achieved          SLP Long Term Goals - 05/03/14 1445    SLP LONG TERM GOAL #1   Title verbalize or demo appropriate compensations during complex conversation with rare min A   Time 8   Period Weeks   Status Achieved   SLP LONG TERM GOAL #2   Title report 25% reduction in dysnomic episodes over three sessions   Time 8   Period Weeks   Status New   SLP LONG TERM GOAL #3   Title report carryover of language enhancing activities done at home over three sessions   Time 8   Period Weeks   Status New    LONG TERM GOAL #3 MET TODAY.       Plan - 05/03/14 1526    Clinical Impression Statement Pt  presents with decr'd language skills now mostly resolved. If pt reports cont'd consistent reduction in anomia, discharge appropriate.   Speech Therapy Frequency 2x / week   Duration --  8 weeks   Treatment/Interventions Patient/family education;Compensatory strategies;SLP instruction and feedback   Potential to Achieve Goals Good        Problem List Patient Active Problem List   Diagnosis Date Noted  . Headache 04/06/2014  . Cervicalgia 03/20/2014  . Myofascial muscle pain 03/20/2014  . Sick sinus syndrome 10/19/2013  . TIA (transient ischemic attack) 07/24/2013  . UTI (lower urinary tract infection) 07/24/2013  . Seizures 07/24/2013  . Delirium of mixed origin 06/09/2013  . Altered mental status 06/03/2013  . PAF (paroxysmal atrial fibrillation) 05/30/2013  . Syncope 05/30/2013  . Pacemaker 05/30/2013  . Acute respiratory failure 05/19/2013  . Chronic anticoagulation 05/19/2013  . Aspiration pneumonia 05/19/2013  . ICH (intracerebral hemorrhage) 05/18/2013  . Dyspnea on exertion 04/06/2013  . Atrial fibrillation, chronic 04/06/2013  . Atherosclerosis of aorta 04/06/2013  . Hypokalemia 11/28/2011  . Heart palpitations 11/27/2011  . Pleural effusion 11/27/2011  . Atrial fibrillation with RVR 11/27/2011  . HTN (hypertension) 11/27/2011                                               [facility] 05/03/2014, 3:29 PM

## 2014-05-04 ENCOUNTER — Ambulatory Visit (INDEPENDENT_AMBULATORY_CARE_PROVIDER_SITE_OTHER): Payer: Medicare Other | Admitting: *Deleted

## 2014-05-04 DIAGNOSIS — I482 Chronic atrial fibrillation, unspecified: Secondary | ICD-10-CM

## 2014-05-05 ENCOUNTER — Ambulatory Visit: Payer: Medicare Other

## 2014-05-05 DIAGNOSIS — R4701 Aphasia: Secondary | ICD-10-CM

## 2014-05-05 DIAGNOSIS — R269 Unspecified abnormalities of gait and mobility: Secondary | ICD-10-CM

## 2014-05-05 DIAGNOSIS — I639 Cerebral infarction, unspecified: Secondary | ICD-10-CM | POA: Diagnosis not present

## 2014-05-05 NOTE — Progress Notes (Signed)
Remote pacemaker transmission.   

## 2014-05-05 NOTE — Therapy (Signed)
Speech Language Pathology Treatment  Patient Details  Name: Ann Kaufman MRN: [id number] Date of Birth: 05-15-1930  Encounter Date: 05/11/14      End of Session - 05-11-14 1529    Visit Number 6   Number of Visits 16   Date for SLP Re-Evaluation 06/06/14      Past Medical History  Diagnosis Date  . Intracerebral hemorrhage     a. 04/2013 in setting of xarelto therapy.  . Atrial fibrillation 2012    a. Dx in 2012;  b. Rhythm controlled - amiodarone, previously anticoagulated with xarelto (d/c'd 04/2013 in setting of [place]);  b. 04/2013 Echo: EF 55-60%, Gr 1 DD, mild MR, mod dil LA, PAsP 79mhg  . Hypertension   . Depression   . Arthritis   . Presence of permanent cardiac pacemaker     a. 2012 MD[place], [place](probable tachy[place]ady).  . Pacemaker   . SSS (sick sinus syndrome)     s/p dual chamber pacemaker  . PAF (paroxysmal atrial fibrillation)   . Aphasia     Occasional aphasia & facial numbness from a left frontal lobe intercerebral hemorrhage on Xarelto on 05/18/13.  .Marland KitchenPericardial effusion   . Mitral valve prolapse   . Hyperlipidemia   . Rheumatoid arthritis   . SOB (shortness of breath)     Occasional SOB  . Palpitations     Occasional.  . Peripheral edema     Because she has been standing on her feet more than usual     Past Surgical History  Procedure Laterality Date  . Pacemaker insertion      [facility] . [facility]    . Breast lumpectomy    . Appendectomy    . Bowel resection    . Lead revision  09/17/11    s/p atrial lead revision    There were no vitals taken for this visit.  Visit Diagnosis: Aphasia    S: Pt reported cont'd at least 25% reduction in dysnomic episodes.      ADULT SLP TREATMENT - 1November 19, [id number]    General Information   Behavior/Cognition Alert;Cooperative;Pleasant mood   Treatment Provided   Treatment provided Cognitive-Linquistic   Pain Assessment   Pain Assessment 0-10   Pain Score 8     Pain Location --  lt hand   Pain Descriptors / Indicators Constant   Pain Intervention(s) Monitored during session   Cognitive-Linquistic Treatment   Treatment focused on Aphasia   Skilled Treatment SLP and pt engaged in conversation with SLP assessing verbal expression in myriad of conversational topics over 30 minutes in a mod distracting environment without anomia noted. Conversation outside walking did not result in anomic episodes.    Assessment / Recommendations / Plan   Plan Discharge SLP treatment due to (comment)  meeting goals   Progression Toward Goals   Progression toward goals Goals met, education completed, [place] [id number]   Status Achieved  met 12015/11/19           G-Codes - 111/19/151522    Functional Assessment Tool Used noms   Functional Limitations Spoken language expressive   Spoken Language Expression Goal Status (([id number] At least 1 percent but less than 20 percent impaired, limited or restricted   Spoken Language Expression Discharge Status [phone] At  least 1 percent but less than 20 percent impaired, limited or restricted      SPEECH THERAPY DISCHARGE SUMMARY  Visits from Start of Care: 6  Remaining deficits: Language expression (verbal expression) WFL. Pt reports she is using circumlocution as well as synonym strategies for rare remaining dysnomic episodes.   Education / Equipment: Strategies for anomia/dysnomia.   Plan: Patient agrees to discharge.  Patient goals were not met. Patient is being discharged due to meeting the stated rehab goals.  ?????        Problem List Patient Active Problem List   Diagnosis Date Noted  . Headache 04/06/2014  . Cervicalgia 03/20/2014  . Myofascial muscle pain 03/20/2014  . Sick sinus syndrome 10/19/2013  . TIA (transient ischemic attack) 07/24/2013  . UTI (lower urinary tract infection) 07/24/2013  .  Seizures 07/24/2013  . Delirium of mixed origin 06/09/2013  . Altered mental status 06/03/2013  . PAF (paroxysmal atrial fibrillation) 05/30/2013  . Syncope 05/30/2013  . Pacemaker 05/30/2013  . Acute respiratory failure 05/19/2013  . Chronic anticoagulation 05/19/2013  . Aspiration pneumonia 05/19/2013  . ICH (intracerebral hemorrhage) 05/18/2013  . Dyspnea on exertion 04/06/2013  . Atrial fibrillation, chronic 04/06/2013  . Atherosclerosis of aorta 04/06/2013  . Hypokalemia 11/28/2011  . Heart palpitations 11/27/2011  . Pleural effusion 11/27/2011  . Atrial fibrillation with RVR 11/27/2011  . HTN (hypertension) 11/27/2011                                               F[place], SLP 05/05/2014, 3:31 PM

## 2014-05-05 NOTE — Therapy (Signed)
Physical Therapy Treatment  Patient Details  Name: Ann Kaufman MRN: [id number] Date of Birth: 1929/07/03  Encounter Date: 05/05/2014      PT End of Session - 05/05/14 1557    Visit Number 3   Number of Visits 10   PT Start Time 1412   PT Stop Time 1444   PT Time Calculation (min) 32 min   Equipment Utilized During Treatment Gait belt   Activity Tolerance Patient tolerated treatment well      Past Medical History  Diagnosis Date  . Intracerebral hemorrhage     a. 04/2013 in setting of xarelto therapy.  . Atrial fibrillation 2012    a. Dx in 2012;  b. Rhythm controlled - amiodarone, previously anticoagulated with xarelto (d/c'd 04/2013 in setting of [place]);  b. 04/2013 Echo: EF 55-60%, Gr 1 DD, mild MR, mod dil LA, PAsP 21mhg  . Hypertension   . Depression   . Arthritis   . Presence of permanent cardiac pacemaker     a. [place]  . Pacemaker   . SSS (sick sinus syndrome)     s/p dual chamber pacemaker  . PAF (paroxysmal atrial fibrillation)   . Aphasia     Occasional aphasia & facial numbness from a left frontal lobe intercerebral hemorrhage on Xarelto on 05/18/13.  .Marland KitchenPericardial effusion   . Mitral valve prolapse   . Hyperlipidemia   . Rheumatoid arthritis   . SOB (shortness of breath)     Occasional SOB  . Palpitations     Occasional.  . Peripheral edema     Because she has been standing on her feet more than usual     Past Surgical History  Procedure Laterality Date  . Pacemaker insertion      [facility] . [facility]    . Breast lumpectomy    . Appendectomy    . Bowel resection    . Lead revision  09/17/11    s/p atrial lead revision    There were no vitals taken for this visit.  Visit Diagnosis:  Abnormality of gait      Subjective Assessment - 05/05/14 1415    Symptoms Pt arrived 11 minutes late. Pt denied falls since last visit. Pt did hit L hand on cabinet and cut hand, but  did not fall. Pt reported she still has difficulty on stairs, due to L LE weakness.   Currently in Pain? Yes   Pain Score 8    Pain Location Hand   Pain Orientation Left   Pain Descriptors / Indicators Burning   Pain Type Acute pain   Pain Onset Other (comment)  hit hand on cabinet and sustained a cut.          [facility] PT Assessment - 05/05/14 1412    AROM   Overall AROM  Deficits   Overall AROM Comments R Cervical lateral flex: 18 degrees; L cervical lateral flex: 23 degrees.          [place] [place]Ex[facility] - 05/05/14 1412    Ambulation/Gait   Ambulation/Gait Yes   Ambulation/Gait Assistance 4: Min guard;4: Min assist   Ambulation/Gait Assistance Details Pt ambulated 600' indoors/outdoors, over even/uneven terrain with CGA and Min A required during one LOB episdoes while ambulating outdoors and performing head turns. VC's to improve heel strike and for ant/post. weight shifting while traversing down/uphill slopes, and to stay in the middle of the sidewalk as pt had  tendency to veer towards R side.   Ambulation Distance (Feet) 600 Feet   Assistive device None   Gait Pattern Step-through pattern;Narrow base of support;Decreased dorsiflexion - left;Decreased dorsiflexion - right  decr. B foot ER   Stairs Yes   Stairs Assistance 4: Min guard   Stair Management Technique One rail Right;Two rails;Step to pattern  step to pattern   Number of Stairs 4   Height of Stairs 4  inches   Standardized Balance Assessment   Standardized Balance Assessment Dynamic Gait Index   Dynamic Gait Index   Level Surface Normal   Change in Gait Speed Mild Impairment   Gait with Horizontal Head Turns Normal   Gait with Vertical Head Turns Mild Impairment   Gait and Pivot Turn Mild Impairment   Step Over Obstacle Mild Impairment   Step Around Obstacles Normal   Steps Moderate Impairment   Total Score 18   Exercises   Exercises Knee/Hip   Knee/Hip Exercises: Stretches   Piriformis  Stretch 2 reps;30 seconds;Other (comment)  L LE   Piriformis Stretch Limitations VC's for technique.          PT Education - 05/05/14 1555    Education provided Yes   Education Details Reviewed piriformis HEP and provided VC's.  Educated pt on walking in the middle of sidewalk vs. R side of sidewalk as pt has tendency to veer towards R side and reported she has slipped off curb prior to PT.   Person(s) Educated Patient   Methods Explanation;Verbal cues;Tactile cues   Comprehension Verbalized understanding;Returned demonstration          PT Short Term Goals - 05/05/14 1604    PT SHORT TERM GOAL #1   Title Pt will be able to: Perform HEP with minimal cueing from PT for accuracy   Time --  05/05/14   Status On-going   PT SHORT TERM GOAL #2   Title Pt will be able to: Improve DGI score to 21/24   Time 0   Period Days   Status Not Met   PT SHORT TERM GOAL #3   Title Pt will be able to: Further assess gait for community distances to ensure safety with uneven terrain   Time 0   Period Days   Status Achieved   PT SHORT TERM GOAL #4   Title Pt will be able to: Navigate 4 steps modified independent with reciprocal gait   Baseline Not met on 05/05/14.   Time 0  05/25/14   Period --   Status On-going   PT SHORT TERM GOAL #5   Title Pt will be able to: Improve neck lateral flexion to 24 degrees bilaterally.   Baseline Not met on 05/05/14. R lat flex: 18 degrees and L lat flex: 23 degrees.   Time 0  05/25/14   Period --   Status On-going          PT Long Term Goals - 05/05/14 1605    PT LONG TERM GOAL #1   Title Pt will be able to: verbalize understanding of: fall prevention strategies within home environment.   Time --  05/25/14   Status On-going   PT LONG TERM GOAL #2   Title Pt will be able to: Perform HEP with independence   Time --  05/25/14   Status On-going   PT LONG TERM GOAL #3   Title Pt will be able to: Improve DGI score to 21/24   Baseline pt scored 18/24  on  05/05/14.   Time --  05/25/14   Status Revised   PT LONG TERM GOAL #4   Title Pt will be able to: Verbalize understanding of home management of tension headaches per stretching, hot/cold modality use as needed.    Time --  05/25/14   Status On-going   PT LONG TERM GOAL #5   Title Pt will ambulate 500' independently, over even/uneven terrains (in/outdoors), while performing head turns.   Time --  05/25/14   Status New          Plan - 05/05/14 1558    Clinical Impression Statement Pt demonstrated progress as she was able to ambulate longer distances indoors/outdoors. Pt continues to experience impaired balance, especially during dynamic gait activities. Pt did not meet STG 2,4, or 5, as she continues to require assist during gait activities and neck ROM is limited. Pt would continue to benefit from skilled therapy to improve funcitonal mobililty, ROM, and to decr. pain.   Pt will benefit from skilled therapeutic intervention in order to improve on the following deficits Abnormal gait;Decreased range of motion;Impaired flexibility;Decreased knowledge of use of DME;Decreased strength;Decreased mobility;Decreased endurance   Rehab Potential Good   PT Frequency --  2x/week for 4 weeks and 1x/week for 2 weeks.   PT Treatment/Interventions ADLs/Self Care Home Management;Neuromuscular re-education;Gait training;Stair training;Patient/family education;Therapeutic activities;Therapeutic exercise;Balance training;DME Instruction;Manual techniques   PT Next Visit Plan Finish assessing STG #1 (HEP) and progress dynamic gait training over uneven terrain and stair training.        Problem List Patient Active Problem List   Diagnosis Date Noted  . Headache 04/06/2014  . Cervicalgia 03/20/2014  . Myofascial muscle pain 03/20/2014  . Sick sinus syndrome 10/19/2013  . TIA (transient ischemic attack) 07/24/2013  . UTI (lower urinary tract infection) 07/24/2013  . Seizures 07/24/2013  . Delirium  of mixed origin 06/09/2013  . Altered mental status 06/03/2013  . PAF (paroxysmal atrial fibrillation) 05/30/2013  . Syncope 05/30/2013  . Pacemaker 05/30/2013  . Acute respiratory failure 05/19/2013  . Chronic anticoagulation 05/19/2013  . Aspiration pneumonia 05/19/2013  . ICH (intracerebral hemorrhage) 05/18/2013  . Dyspnea on exertion 04/06/2013  . Atrial fibrillation, chronic 04/06/2013  . Atherosclerosis of aorta 04/06/2013  . Hypokalemia 11/28/2011  . Heart palpitations 11/27/2011  . Pleural effusion 11/27/2011  . Atrial fibrillation with RVR 11/27/2011  . HTN (hypertension) 11/27/2011                                              Miller,Jennifer L 05/05/2014, 4:14 PM     Miller,Jennifer L, PT 4:14 PM

## 2014-05-08 ENCOUNTER — Ambulatory Visit: Payer: Medicare Other | Admitting: Physical Therapy

## 2014-05-08 ENCOUNTER — Encounter: Payer: Self-pay | Admitting: Physical Therapy

## 2014-05-08 VITALS — BP 159/88 | HR 69

## 2014-05-08 DIAGNOSIS — R269 Unspecified abnormalities of gait and mobility: Secondary | ICD-10-CM

## 2014-05-08 DIAGNOSIS — I639 Cerebral infarction, unspecified: Secondary | ICD-10-CM | POA: Diagnosis not present

## 2014-05-08 NOTE — Therapy (Signed)
Physical Therapy Treatment  Patient Details  Name: Ann Kaufman MRN: [id number] Date of Birth: Mar 26, 1930  Encounter Date: 05/08/2014      PT End of Session - 05/08/14 1222    Visit Number 4   Number of Visits 10   Date for PT Re-Evaluation 05/25/14   PT Start Time 0931   PT Stop Time 1015   PT Time Calculation (min) 44 min   Equipment Utilized During Treatment Gait belt   Activity Tolerance Patient tolerated treatment well   Behavior During Therapy [facility] for tasks assessed/performed      Past Medical History  Diagnosis Date  . Intracerebral hemorrhage     a. 04/2013 in setting of xarelto therapy.  . Atrial fibrillation 2012    a. Dx in 2012;  b. Rhythm controlled - amiodarone, previously anticoagulated with xarelto (d/c'd 04/2013 in setting of [place]);  b. 04/2013 Echo: EF 55-60%, Gr 1 DD, mild MR, mod dil LA, PAsP 35mhg  . Hypertension   . Depression   . Arthritis   . Presence of permanent cardiac pacemaker     a. [place]  . Pacemaker   . SSS (sick sinus syndrome)     s/p dual chamber pacemaker  . PAF (paroxysmal atrial fibrillation)   . Aphasia     Occasional aphasia & facial numbness from a left frontal lobe intercerebral hemorrhage on Xarelto on 05/18/13.  .Marland KitchenPericardial effusion   . Mitral valve prolapse   . Hyperlipidemia   . Rheumatoid arthritis   . SOB (shortness of breath)     Occasional SOB  . Palpitations     Occasional.  . Peripheral edema     Because she has been standing on her feet more than usual     Past Surgical History  Procedure Laterality Date  . Pacemaker insertion      [facility]    . Breast lumpectomy    . Appendectomy    . Bowel resection    . Lead revision  09/17/11    s/p atrial lead revision    BP 159/88 mmHg  Pulse 69  Visit Diagnosis:  Abnormality of gait      Subjective Assessment - 05/08/14 0937    Symptoms No falls since last visit.     Currently in Pain? Yes   Pain Score 8    Pain Location Head   Pain Orientation Anterior   Pain Descriptors / Indicators Headache   Pain Type Acute pain          [facility] PT Assessment - 05/08/14 0930    AROM   Overall AROM  Deficits   Overall AROM Comments R Cervical lateral flex: 13 degrees; L cervical lateral flex: 21 degrees.   Transfers   Transfers Sit to Stand;Stand to Sit   Sit to Stand 5: Supervision   Sit to Stand Details (indicate cue type and reason) no cues   Stand to Sit 5: Supervision   Stand to Sit Details no cues   Ambulation/Gait   Ambulation/Gait Yes   Ambulation/Gait Assistance 5: Supervision;4: Min guard  min guard during turns and uneven terrain   Ambulation/Gait Assistance Details Patient ambulates with posterior trunk lean, suspected due to weakness in hip extensor muscles (needs assessment). Demonstrates increased posterior trunk lean and weight shift with turns requiring min guard for safety. Consistently ambulates with variable gait pattern.   Assistive device None   Gait Pattern Step-through  pattern  variable, posterior trunk lean   Stairs Yes   Stairs Assistance 4: Min guard   Stair Management Technique One rail Right;Alternating pattern  1 rail to perform alternating patter, quad weakness observed   Number of Stairs 4  x2   Height of Stairs 4   Dynamic Gait Index   Level Surface Normal   Change in Gait Speed Normal   Gait with Horizontal Head Turns Mild Impairment   Gait with Vertical Head Turns Normal   Gait and Pivot Turn Normal   Step Over Obstacle Mild Impairment   Step Around Obstacles Normal   Steps Mild Impairment   Total Score 21          [facility] Adult PT Treatment/Exercise - 05/08/14 0930    Ambulation/Gait   Ambulation Distance (Feet) 1200 Feet  indoor (775 ft) and outdoor even (350 ft) grass (75 ft)  No device with supervision          PT Education - 05/08/14 1221    Education provided Yes   Education Details Reviewed  HEP exercises, frequency, duration, intensity.   Person(s) Educated Patient   Methods Explanation;Demonstration   Comprehension Verbalized understanding;Need further instruction          PT Short Term Goals - 05/08/14 1234    PT SHORT TERM GOAL #1   Title Pt will be able to: Perform HEP with minimal cueing from PT for accuracy.   Baseline Patient verbalized compliance. Instructed to bring HEP next session to review individually for accuracy.   Time 1   Period Weeks   Status On-going   PT SHORT TERM GOAL #2   Title Pt will be able to: Improve DGI score to 21/24 (05/05/14)   Time 1   Period Weeks   Status Achieved   PT SHORT TERM GOAL #3   Title Pt will be able to: Further assess gait for community distances to ensure safety with uneven terrain   Status Achieved   PT SHORT TERM GOAL #4   Title Pt will be able to: Navigate 4 steps modified independent with reciprocal gait   Baseline Not met on 05/08/14   Time 1   Period Weeks   Status On-going   PT SHORT TERM GOAL #5   Title Pt will be able to: Improve neck lateral flexion to 24 degrees bilaterally.   Baseline Not met on 05/08/14 (R) lateral flexion 13; (L) lateral flexion 21   Time 1   Period Weeks   Status On-going            Plan - 05/08/14 1227    Clinical Impression Statement Pt demonstrated ability to ambulate 1200 ft on even and uneven surfaces but demonstrates gait deviations that increase risk of future falls. Patient showed improvements in dynamic gait assessment and verbalized compliance with HEP.    Pt will benefit from skilled therapeutic intervention in order to improve on the following deficits Abnormal gait;Decreased range of motion;Impaired flexibility;Decreased knowledge of use of DME;Decreased strength;Decreased mobility;Decreased endurance;Decreased balance;Decreased safety awareness;Decreased cognition   Rehab Potential Good   PT Frequency 2x / week   PT Duration 4 weeks   PT Treatment/Interventions  ADLs/Self Care Home Management;Neuromuscular re-education;Gait training;Stair training;Patient/family education;Therapeutic activities;Therapeutic exercise;Balance training;DME Instruction;Manual techniques;Energy conservation;Functional mobility training   PT Next Visit Plan Continue gait assessment, provide cueing and exercises targeting gait deviaitons (posterior lean, variable gait). Review HEP exercises, decrease verbal cueing for accuracy.    Consulted and Agree with Plan of Care Patient  Problem List Patient Active Problem List   Diagnosis Date Noted  . Headache 04/06/2014  . Cervicalgia 03/20/2014  . Myofascial muscle pain 03/20/2014  . Sick sinus syndrome 10/19/2013  . TIA (transient ischemic attack) 07/24/2013  . UTI (lower urinary tract infection) 07/24/2013  . Seizures 07/24/2013  . Delirium of mixed origin 06/09/2013  . Altered mental status 06/03/2013  . PAF (paroxysmal atrial fibrillation) 05/30/2013  . Syncope 05/30/2013  . Pacemaker 05/30/2013  . Acute respiratory failure 05/19/2013  . Chronic anticoagulation 05/19/2013  . Aspiration pneumonia 05/19/2013  . ICH (intracerebral hemorrhage) 05/18/2013  . Dyspnea on exertion 04/06/2013  . Atrial fibrillation, chronic 04/06/2013  . Atherosclerosis of aorta 04/06/2013  . Hypokalemia 11/28/2011  . Heart palpitations 11/27/2011  . Pleural effusion 11/27/2011  . Atrial fibrillation with RVR 11/27/2011  . HTN (hypertension) 11/27/2011     This entire session of physical therapy was performed under the direct supervision of PT signing evaluation /treatment.   Blima Rich, Student PT  Jamey Reas 05/08/2014, 12:57 PM

## 2014-05-08 NOTE — Therapy (Signed)
Physical Therapy Treatment  Patient Details  Name: Ann Kaufman MRN: [id number] Date of Birth: 11/25/1929  Encounter Date: 05/08/2014      PT End of Session - 05/08/14 1222    Visit Number 4   Number of Visits 10   Date for PT Re-Evaluation 05/25/14   PT Start Time 0931   PT Stop Time 1015   PT Time Calculation (min) 44 min   Equipment Utilized During Treatment Gait belt   Activity Tolerance Patient tolerated treatment well   Behavior During Therapy [facility] for tasks assessed/performed      Past Medical History  Diagnosis Date  . Intracerebral hemorrhage     a. 04/2013 in setting of xarelto therapy.  . Atrial fibrillation 2012    a. Dx in 2012;  b. Rhythm controlled - amiodarone, previously anticoagulated with xarelto (d/c'd 04/2013 in setting of [place]);  b. 04/2013 Echo: EF 55-60%, Gr 1 DD, mild MR, mod dil LA, PAsP 37mhg  . Hypertension   . Depression   . Arthritis   . Presence of permanent cardiac pacemaker     a. 2012 [place], [place]probable tachy-brady).  . Pacemaker   . SSS (sick sinus syndrome)     s/p dual chamber pacemaker  . PAF (paroxysmal atrial fibrillation)   . Aphasia     Occasional aphasia & facial numbness from a left frontal lobe intercerebral hemorrhage on Xarelto on 05/18/13.  .Marland KitchenPericardial effusion   . Mitral valve prolapse   . Hyperlipidemia   . Rheumatoid arthritis   . SOB (shortness of breath)     Occasional SOB  . Palpitations     Occasional.  . Peripheral edema     Because she has been standing on her feet more than usual     Past Surgical History  Procedure Laterality Date  . Pacemaker insertion      [facility]    . Breast lumpectomy    . Appendectomy    . Bowel resection    . Lead revision  09/17/11    s/p atrial lead revision    BP 159/88 mmHg  Pulse 69  Visit Diagnosis:  Abnormality of gait      Subjective Assessment - 05/08/14 0937    Symptoms No falls since last visit.     Currently in Pain? Yes   Pain Score 8    Pain Location Head   Pain Orientation Anterior   Pain Descriptors / Indicators Headache   Pain Type Acute pain          [facility] PT Assessment - 05/08/14 0930    AROM   Overall AROM  Deficits   Overall AROM Comments R Cervical lateral flex: 13 degrees; L cervical lateral flex: 21 degrees.   Transfers   Transfers Sit to Stand;Stand to Sit   Sit to Stand 5: Supervision   Sit to Stand Details (indicate cue type and reason) no cues   Stand to Sit 5: Supervision   Stand to Sit Details no cues   Ambulation/Gait   Ambulation/Gait Yes   Ambulation/Gait Assistance 5: Supervision;4: Min guard  min guard during turns and uneven terrain   Ambulation/Gait Assistance Details Patient ambulates with posterior trunk lean, suspected due to weakness in hip extensor muscles (needs assessment). Demonstrates increased posterior trunk lean and weight shift with turns requiring min guard for safety. Consistently ambulates with variable gait pattern.   Assistive device None   Gait Pattern Step-through  pattern  variable, posterior trunk lean   Stairs Yes   Stairs Assistance 4: Min guard   Stair Management Technique One rail Right;Alternating pattern  1 rail to perform alternating patter, quad weakness observed   Number of Stairs 4  x2   Height of Stairs 4   Dynamic Gait Index   Level Surface Normal   Change in Gait Speed Normal   Gait with Horizontal Head Turns Mild Impairment   Gait with Vertical Head Turns Normal   Gait and Pivot Turn Normal   Step Over Obstacle Mild Impairment   Step Around Obstacles Normal   Steps Mild Impairment   Total Score 21          [facility] Adult PT Treatment/Exercise - 05/08/14 0930    Ambulation/Gait   Ambulation Distance (Feet) 1200 Feet  indoor (775 ft) and outdoor even (350 ft) grass (75 ft)          PT Education - 05/08/14 1221    Education provided Yes   Education Details Reviewed HEP exercises, frequency,  duration, intensity.   Person(s) Educated Patient   Methods Explanation;Demonstration   Comprehension Verbalized understanding;Need further instruction          PT Short Term Goals - 05/08/14 1234    PT SHORT TERM GOAL #1   Title Pt will be able to: Perform HEP with minimal cueing from PT for accuracy.   Baseline Patient verbalized compliance. Instructed to bring HEP next session to review individually for accuracy.   Time 1   Period Weeks   Status On-going   PT SHORT TERM GOAL #2   Title Pt will be able to: Improve DGI score to 21/24 (05/05/14)   Time 1   Period Weeks   Status Achieved   PT SHORT TERM GOAL #3   Title Pt will be able to: Further assess gait for community distances to ensure safety with uneven terrain   Status Achieved   PT SHORT TERM GOAL #4   Title Pt will be able to: Navigate 4 steps modified independent with reciprocal gait   Baseline Not met on 05/08/14   Time 1   Period Weeks   Status On-going   PT SHORT TERM GOAL #5   Title Pt will be able to: Improve neck lateral flexion to 24 degrees bilaterally.   Baseline Not met on 05/08/14 (R) lateral flexion 13; (L) lateral flexion 21   Time 1   Period Weeks   Status On-going            Plan - 05/08/14 1227    Clinical Impression Statement Pt demonstrated ability to ambulate 1200 ft on even and uneven surfaces but demonstrates gait deviations that increase risk of future falls. Patient showed improvements in dynamic gait assessment and verbalized compliance with HEP.    Pt will benefit from skilled therapeutic intervention in order to improve on the following deficits Abnormal gait;Decreased range of motion;Impaired flexibility;Decreased knowledge of use of DME;Decreased strength;Decreased mobility;Decreased endurance;Decreased balance;Decreased safety awareness;Decreased cognition   Rehab Potential Good   PT Frequency 2x / week   PT Duration 4 weeks   PT Treatment/Interventions ADLs/Self Care Home  Management;Neuromuscular re-education;Gait training;Stair training;Patient/family education;Therapeutic activities;Therapeutic exercise;Balance training;DME Instruction;Manual techniques;Energy conservation;Functional mobility training   PT Next Visit Plan Continue gait assessment, provide cueing and exercises targeting gait deviaitons (posterior lean, variable gait). Review HEP exercises, decrease verbal cueing for accuracy.    Consulted and Agree with Plan of Care Patient  Problem List Patient Active Problem List   Diagnosis Date Noted  . Headache 04/06/2014  . Cervicalgia 03/20/2014  . Myofascial muscle pain 03/20/2014  . Sick sinus syndrome 10/19/2013  . TIA (transient ischemic attack) 07/24/2013  . UTI (lower urinary tract infection) 07/24/2013  . Seizures 07/24/2013  . Delirium of mixed origin 06/09/2013  . Altered mental status 06/03/2013  . PAF (paroxysmal atrial fibrillation) 05/30/2013  . Syncope 05/30/2013  . Pacemaker 05/30/2013  . Acute respiratory failure 05/19/2013  . Chronic anticoagulation 05/19/2013  . Aspiration pneumonia 05/19/2013  . ICH (intracerebral hemorrhage) 05/18/2013  . Dyspnea on exertion 04/06/2013  . Atrial fibrillation, chronic 04/06/2013  . Atherosclerosis of aorta 04/06/2013  . Hypokalemia 11/28/2011  . Heart palpitations 11/27/2011  . Pleural effusion 11/27/2011  . Atrial fibrillation with RVR 11/27/2011  . HTN (hypertension) 11/27/2011       All PT delivered under supervision of Licensed Physical Therapist.     Blima Rich, Student PT 05/08/2014, 12:41 PM

## 2014-05-12 ENCOUNTER — Ambulatory Visit: Payer: Medicare Other | Admitting: Rehabilitative and Restorative Service Providers"

## 2014-05-12 ENCOUNTER — Telehealth: Payer: Self-pay | Admitting: Rehabilitative and Restorative Service Providers"

## 2014-05-12 LAB — MDC_IDC_ENUM_SESS_TYPE_REMOTE
Brady Statistic AP VS Percent: 100 %
Brady Statistic AS VP Percent: 0 %
Brady Statistic AS VS Percent: 0 %
Date Time Interrogation Session: 20151113011435
Lead Channel Impedance Value: 501 Ohm
Lead Channel Pacing Threshold Amplitude: 1.375 V
Lead Channel Pacing Threshold Pulse Width: 0.4 ms
Lead Channel Pacing Threshold Pulse Width: 0.4 ms
Lead Channel Sensing Intrinsic Amplitude: 11.2 mV
Lead Channel Setting Pacing Amplitude: 2.75 V
Lead Channel Setting Pacing Pulse Width: 0.4 ms
Lead Channel Setting Sensing Sensitivity: 2 mV
MDC IDC MSMT BATTERY IMPEDANCE: 230 Ohm
MDC IDC MSMT BATTERY REMAINING LONGEVITY: 119 mo
MDC IDC MSMT BATTERY VOLTAGE: 2.8 V
MDC IDC MSMT LEADCHNL RA IMPEDANCE VALUE: 508 Ohm
MDC IDC MSMT LEADCHNL RA PACING THRESHOLD AMPLITUDE: 0.625 V
MDC IDC SET LEADCHNL RA PACING AMPLITUDE: 1.5 V
MDC IDC STAT BRADY AP VP PERCENT: 0 %

## 2014-05-12 NOTE — Telephone Encounter (Signed)
Ann Kaufman called to report onset of back pain.  She reports 18 year h/o pain that is intermittent in nature.  She does not feel she could get a full benefit from PT today and therefore, I recommended she cx today's visit and come to next scheduled appt.

## 2014-05-15 ENCOUNTER — Ambulatory Visit: Payer: Medicare Other | Admitting: Physical Therapy

## 2014-05-15 ENCOUNTER — Encounter: Payer: Self-pay | Admitting: Physical Therapy

## 2014-05-15 DIAGNOSIS — I639 Cerebral infarction, unspecified: Secondary | ICD-10-CM | POA: Diagnosis not present

## 2014-05-15 DIAGNOSIS — R269 Unspecified abnormalities of gait and mobility: Secondary | ICD-10-CM

## 2014-05-15 NOTE — Therapy (Signed)
Physical Therapy Treatment  Patient Details  Name: Ann Kaufman MRN: [id number] Date of Birth: December 10, 1929  Encounter Date: 05/15/2014      PT End of Session - 05/15/14 1552    Visit Number 5   Number of Visits 10   Date for PT Re-Evaluation 05/25/14   PT Start Time 1455   PT Stop Time 1537   PT Time Calculation (min) 42 min   Equipment Utilized During Treatment Gait belt   Activity Tolerance Patient tolerated treatment well   Behavior During Therapy [facility] for tasks assessed/performed      Past Medical History  Diagnosis Date  . Intracerebral hemorrhage     a. 04/2013 in setting of xarelto therapy.  . Atrial fibrillation 2012    a. Dx in 2012;  b. Rhythm controlled - amiodarone, previously anticoagulated with xarelto (d/c'd 04/2013 in setting of [place]);  b. 04/2013 Echo: EF 55-60%, Gr 1 DD, mild MR, mod dil LA, PAsP 32mmhg  . Hypertension   . Depression   . Arthritis   . Presence of permanent cardiac pacemaker     a. 2012 MDT, placed in [place] (probable tachy-brady).  . Pacemaker   . SSS (sick sinus syndrome)     s/p dual chamber pacemaker  . PAF (paroxysmal atrial fibrillation)   . Aphasia     Occasional aphasia & facial numbness from a left frontal lobe intercerebral hemorrhage on Xarelto on 05/18/13.  Marland Kitchen Pericardial effusion   . Mitral valve prolapse   . Hyperlipidemia   . Rheumatoid arthritis   . SOB (shortness of breath)     Occasional SOB  . Palpitations     Occasional.  . Peripheral edema     Because she has been standing on her feet more than usual     Past Surgical History  Procedure Laterality Date  . Pacemaker insertion      MDT implanted at [facility]  . Abdominal hysterectomy    . Breast lumpectomy    . Appendectomy    . Bowel resection    . Lead revision  09/17/11    s/p atrial lead revision    There were no vitals taken for this visit.  Visit Diagnosis:  Abnormality of gait      Subjective Assessment - 05/15/14 1509    Symptoms Back pain  is  beter than last week. "It flares up ~every 6 months to year."   Patient Stated Goals To get balance better without pain.   Currently in Pain? Yes   Pain Score 3    Pain Location Sacrum   Pain Orientation Left   Pain Descriptors / Indicators Nagging   Pain Type Chronic pain   Pain Onset More than a month ago            [facility] Adult PT Treatment/Exercise - 05/15/14 1445    Bed Mobility   Bed Mobility Sit to Supine;Supine to Sit  cues on technique to decrease back strain   Lumbar Exercises: Supine   Ab Set 10 reps;3 seconds;Other (comment)  verbal & tactile cues on movement   Bridge 10 reps;3 seconds;Other (comment)  verbal & tactile cues on movement, abdominal isometric   Other Supine Lumbar Exercises adductor ball squeeze with isometric abdominal  3 sec hold X 10 reps with verbal & tactile cues   Other Supine Lumbar Exercises Hip abduction with yellow theraband with abdominal isometric  3 sec hold X 10 reps with verbal & tactile cues  Balance Exercises - 05/15/14 1541    Balance Exercises: Standing   Standing Eyes Opened Narrow base of support (BOS);Foam/compliant surface;Solid surface;30 secs;Wide (BOA);Other (comment)  Eyes open solid feet together, on foam feet apart & feet together   Standing Eyes Closed Narrow base of support (BOS);Wide (BOA);Foam/compliant surface;30 secs Solid feet apart & feet together; on foam feet apart & together   Tandem Stance Eyes open;Eyes closed;Foam/compliant surface;30 secs On solid surface eyes open & closed; on foam eyes open          PT Education - 05/15/14 1550    Education provided Yes   Education Details reviewed HEP plan & falls prevention strategies, bed mobility   Person(s) Educated Patient   Methods Explanation;Demonstration   Comprehension Verbalized understanding;Returned demonstration;Need further instruction            PT Long Term Goals - 05/15/14 1445    PT LONG TERM GOAL #1   Title Pt will be able  to: verbalize understanding of: fall prevention strategies within home environment.   Time --  05/25/14   Status On-going   PT LONG TERM GOAL #2   Title Pt will be able to: Perform HEP with independence   Time --  05/25/14   Status On-going   PT LONG TERM GOAL #3   Title Pt will be able to: Improve DGI score to 21/24   Baseline pt scored 18/24 on 05/05/14.   Time --  05/25/14   Status Revised   PT LONG TERM GOAL #4   Title Pt will be able to: Verbalize understanding of home management of tension headaches per stretching, hot/cold modality use as needed.    Time --  05/25/14   Status On-going   PT LONG TERM GOAL #5   Title Pt will ambulate 500' independently, over even/uneven terrains (in/outdoors), while performing head turns.   Time --  05/25/14   Status New          Plan - 05/15/14 1552    Clinical Impression Statement Patient tolerated exercises without increased pain in her back. Patient was able to engage core muscles with standing balance exercises which should improve balance without aggrevating her back.   Pt will benefit from skilled therapeutic intervention in order to improve on the following deficits Abnormal gait;Decreased range of motion;Impaired flexibility;Decreased knowledge of use of DME;Decreased strength;Decreased mobility;Decreased endurance;Decreased balance;Decreased safety awareness;Decreased cognition   Rehab Potential Good   PT Frequency 2x / week   PT Duration 2 weeks   PT Treatment/Interventions ADLs/Self Care Home Management;Neuromuscular re-education;Gait training;Stair training;Patient/family education;Therapeutic activities;Therapeutic exercise;Balance training;DME Instruction;Manual techniques;Energy conservation;Functional mobility training   PT Next Visit Plan Continue gait assessment, provide cueing and exercises targeting gait deviaitons (posterior lean, variable gait). Review HEP exercises, decrease verbal cueing for accuracy.    PT Plan Progress  dynamic gait activities, assess gait outdoors over uneven terrain (unable to assess today due to rain). Manual therapy as needed and balance activities. Standing hip extension.        Problem List Patient Active Problem List   Diagnosis Date Noted  . Headache 04/06/2014  . Cervicalgia 03/20/2014  . Myofascial muscle pain 03/20/2014  . Sick sinus syndrome 10/19/2013  . TIA (transient ischemic attack) 07/24/2013  . UTI (lower urinary tract infection) 07/24/2013  . Seizures 07/24/2013  . Delirium of mixed origin 06/09/2013  . Altered mental status 06/03/2013  . PAF (paroxysmal atrial fibrillation) 05/30/2013  . Syncope 05/30/2013  . Pacemaker 05/30/2013  . Acute respiratory failure  05/19/2013  . Chronic anticoagulation 05/19/2013  . Aspiration pneumonia 05/19/2013  . ICH (intracerebral hemorrhage) 05/18/2013  . Dyspnea on exertion 04/06/2013  . Atrial fibrillation, chronic 04/06/2013  . Atherosclerosis of aorta 04/06/2013  . Hypokalemia 11/28/2011  . Heart palpitations 11/27/2011  . Pleural effusion 11/27/2011  . Atrial fibrillation with RVR 11/27/2011  . HTN (hypertension) 11/27/2011                                        This entire session of physical therapy was performed under the direct supervision of PT signing evaluation /treatment.  Blima Rich, Student Physical Therapist  Jamey Reas, PT, DPT Physical Therapist Specializing in Prosthetics & Limb Loss Care Phone: [phone] FAX: [phone] [address] [place], [place] [address]    Jamey Reas 05/15/2014, 3:56 PM

## 2014-05-16 ENCOUNTER — Ambulatory Visit: Payer: Medicare Other

## 2014-05-16 ENCOUNTER — Ambulatory Visit: Payer: Medicare Other | Admitting: Rehabilitative and Restorative Service Providers"

## 2014-05-16 ENCOUNTER — Encounter: Payer: Self-pay | Admitting: Rehabilitative and Restorative Service Providers"

## 2014-05-16 DIAGNOSIS — I639 Cerebral infarction, unspecified: Secondary | ICD-10-CM | POA: Diagnosis not present

## 2014-05-16 DIAGNOSIS — R269 Unspecified abnormalities of gait and mobility: Secondary | ICD-10-CM

## 2014-05-16 NOTE — Therapy (Signed)
Physical Therapy Treatment  Patient Details  Name: Ann Kaufman MRN: [id number] Date of Birth: 05-12-1930  Encounter Date: 05/16/2014      PT End of Session - 05/16/14 1202    Visit Number 6   Number of Visits 10   Date for PT Re-Evaluation 05/25/14   PT Start Time 2778   PT Stop Time 1238   PT Time Calculation (min) 43 min      Past Medical History  Diagnosis Date  . Intracerebral hemorrhage     a. 04/2013 in setting of xarelto therapy.  . Atrial fibrillation 2012    a. Dx in 2012;  b. Rhythm controlled - amiodarone, previously anticoagulated with xarelto (d/c'd 04/2013 in setting of [place]);  b. 04/2013 Echo: EF 55-60%, Gr 1 DD, mild MR, mod dil LA, PAsP 7mmhg  . Hypertension   . Depression   . Arthritis   . Presence of permanent cardiac pacemaker     a. 2012 MDT, placed in [place] (probable tachy-brady).  . Pacemaker   . SSS (sick sinus syndrome)     s/p dual chamber pacemaker  . PAF (paroxysmal atrial fibrillation)   . Aphasia     Occasional aphasia & facial numbness from a left frontal lobe intercerebral hemorrhage on Xarelto on 05/18/13.  Marland Kitchen Pericardial effusion   . Mitral valve prolapse   . Hyperlipidemia   . Rheumatoid arthritis   . SOB (shortness of breath)     Occasional SOB  . Palpitations     Occasional.  . Peripheral edema     Because she has been standing on her feet more than usual     Past Surgical History  Procedure Laterality Date  . Pacemaker insertion      MDT implanted at [facility]  . Abdominal hysterectomy    . Breast lumpectomy    . Appendectomy    . Bowel resection    . Lead revision  09/17/11    s/p atrial lead revision    There were no vitals taken for this visit.  Visit Diagnosis:  Abnormality of gait      Subjective Assessment - 05/16/14 1156    Symptoms The patient reports her neck has loosened up.  She reports recent increase in drooling--she doesn't feel this symptoms started right after CVA but has been going on.  She  reports that she has made some recent changes in her living arrangements.   Her back feels improved.                                  She also notes   Currently in Pain? Yes   Pain Score 4   0/10 seated at rest.  Pain exiting car.   Pain Location Sacrum   Pain Orientation Left   Pain Descriptors / Indicators Jabbing  when she gets out of the car   Pain Type Chronic pain   Pain Onset More than a month ago    THERAPEUTIC EXERCISE: Supine lumbar flexion/extension with towel roll perpendicular to spine for anterior pelvic tilt.   LE and lumbar rotation/rocking with passive overpressure and addition of IT band stretch bilaterally. Shoulder circles seated, neck contract/relax for lateral flexion stretching/ROM.  Standing postural cues for shoulder retraction and depression.  Gait: The patient ambulates with cues longer stride length, however experiences L hip pain with gait.  Ambulation forward/backwards with direction changes and with CGA for safety during dynamic  gait activities.  Stair negotiation x 4 steps with one handrail and reciprocal pattern noting pain L hip hindering mobility.  NEUROMUSCULAR RE-EDUCATION: The patient performs alternating LE foot taps with ball overhead to encourage postural upright. Standing balance activities emphasizing hip/pelvic stabilization.       PT Long Term Goals - 05/15/14 1445    PT LONG TERM GOAL #1   Title Pt will be able to: verbalize understanding of: fall prevention strategies within home environment.   Time --  05/25/14   Status On-going   PT LONG TERM GOAL #2   Title Pt will be able to: Perform HEP with independence   Time --  05/25/14   Status On-going   PT LONG TERM GOAL #3   Title Pt will be able to: Improve DGI score to 21/24   Baseline pt scored 18/24 on 05/05/14.   Time --  05/25/14   Status Revised   PT LONG TERM GOAL #4   Title Pt will be able to: Verbalize understanding of home management of tension headaches per stretching,  hot/cold modality use as needed.    Time --  05/25/14   Status On-going   PT LONG TERM GOAL #5   Title Pt will ambulate 500' independently, over even/uneven terrains (in/outdoors), while performing head turns.   Time --  05/25/14   Status New          Plan - 05/16/14 1322    Clinical Impression Statement Patient has multiple orthopedic issues limiting standing activities today.  L SI joint intermittent discomfort, L greater trochanter pain with exertion/walking with longer stride position.  She repors these issues have been long standing and she may benefit from medical intervention/assessment to address.                                                                                                          PT Next Visit Plan continue gait training and postural re-ed for posterior lean, review/progress HEP, address strengthening/ROM and begin d/c planning.   PT Plan Progress to LTGs and begin d/c plan.        Problem List Patient Active Problem List   Diagnosis Date Noted  . Headache 04/06/2014  . Cervicalgia 03/20/2014  . Myofascial muscle pain 03/20/2014  . Sick sinus syndrome 10/19/2013  . TIA (transient ischemic attack) 07/24/2013  . UTI (lower urinary tract infection) 07/24/2013  . Seizures 07/24/2013  . Delirium of mixed origin 06/09/2013  . Altered mental status 06/03/2013  . PAF (paroxysmal atrial fibrillation) 05/30/2013  . Syncope 05/30/2013  . Pacemaker 05/30/2013  . Acute respiratory failure 05/19/2013  . Chronic anticoagulation 05/19/2013  . Aspiration pneumonia 05/19/2013  . ICH (intracerebral hemorrhage) 05/18/2013  . Dyspnea on exertion 04/06/2013  . Atrial fibrillation, chronic 04/06/2013  . Atherosclerosis of aorta 04/06/2013  . Hypokalemia 11/28/2011  . Heart palpitations 11/27/2011  . Pleural effusion 11/27/2011  . Atrial fibrillation with RVR 11/27/2011  . HTN (hypertension) 11/27/2011  Rudell Cobb, PT, MPT 05/16/2014 1:45 PM [facility] Phone: [phone] Fax: [phone]  [place] 05/16/2014, 1:45 PM

## 2014-05-22 ENCOUNTER — Encounter: Payer: Medicare Other | Attending: Physical Medicine & Rehabilitation | Admitting: Physical Medicine & Rehabilitation

## 2014-05-22 ENCOUNTER — Encounter: Payer: Self-pay | Admitting: Physical Medicine & Rehabilitation

## 2014-05-22 ENCOUNTER — Ambulatory Visit: Payer: Medicare Other

## 2014-05-22 VITALS — BP 143/62 | HR 60 | Resp 14 | Ht 64.0 in | Wt 173.0 lb

## 2014-05-22 DIAGNOSIS — I610 Nontraumatic intracerebral hemorrhage in hemisphere, subcortical: Secondary | ICD-10-CM

## 2014-05-22 DIAGNOSIS — R269 Unspecified abnormalities of gait and mobility: Secondary | ICD-10-CM

## 2014-05-22 DIAGNOSIS — M542 Cervicalgia: Secondary | ICD-10-CM | POA: Diagnosis not present

## 2014-05-22 DIAGNOSIS — M797 Fibromyalgia: Secondary | ICD-10-CM

## 2014-05-22 DIAGNOSIS — I639 Cerebral infarction, unspecified: Secondary | ICD-10-CM

## 2014-05-22 DIAGNOSIS — M069 Rheumatoid arthritis, unspecified: Secondary | ICD-10-CM | POA: Diagnosis not present

## 2014-05-22 DIAGNOSIS — I480 Paroxysmal atrial fibrillation: Secondary | ICD-10-CM | POA: Insufficient documentation

## 2014-05-22 DIAGNOSIS — I495 Sick sinus syndrome: Secondary | ICD-10-CM | POA: Diagnosis not present

## 2014-05-22 DIAGNOSIS — Z87891 Personal history of nicotine dependence: Secondary | ICD-10-CM | POA: Insufficient documentation

## 2014-05-22 DIAGNOSIS — E785 Hyperlipidemia, unspecified: Secondary | ICD-10-CM | POA: Insufficient documentation

## 2014-05-22 DIAGNOSIS — M7918 Myalgia, other site: Secondary | ICD-10-CM

## 2014-05-22 DIAGNOSIS — I629 Nontraumatic intracranial hemorrhage, unspecified: Secondary | ICD-10-CM | POA: Insufficient documentation

## 2014-05-22 DIAGNOSIS — R52 Pain, unspecified: Secondary | ICD-10-CM | POA: Diagnosis present

## 2014-05-22 DIAGNOSIS — Z950 Presence of cardiac pacemaker: Secondary | ICD-10-CM | POA: Diagnosis not present

## 2014-05-22 DIAGNOSIS — I10 Essential (primary) hypertension: Secondary | ICD-10-CM | POA: Diagnosis not present

## 2014-05-22 DIAGNOSIS — M47816 Spondylosis without myelopathy or radiculopathy, lumbar region: Secondary | ICD-10-CM | POA: Diagnosis not present

## 2014-05-22 DIAGNOSIS — R569 Unspecified convulsions: Secondary | ICD-10-CM | POA: Insufficient documentation

## 2014-05-22 DIAGNOSIS — M791 Myalgia: Secondary | ICD-10-CM | POA: Diagnosis not present

## 2014-05-22 NOTE — Progress Notes (Signed)
Subjective:    Patient ID: Ann Kaufman, female    DOB: 12-May-1930, 80 y.o.   MRN: [id number]  HPI   Ann Kaufman is back regarding her ICH and pain.    She is still having some headaches which come on during the day. She uses tylenol to help treat the pain which tends to be effective.    Mild spondylosis of the cervical spine with moderate disc disease at the C5-6 level. Left-sided neural foraminal narrowing at the C4-5 and C5-6 levels. Degenerative subluxations as described.  Therapy has been working on posture and ROM for her shoulder girdle/neck/head.   She is also having some pain in her low back with radiation into her left buttocks.    Pain Inventory Average Pain 3 Pain Right Now 3 My pain is burning and aching  In the last 24 hours, has pain interfered with the following? General activity 5 Relation with others 3 Enjoyment of life 5 What TIME of day is your pain at its worst? daytime Sleep (in general) Fair  Pain is worse with: walking, bending, inactivity and some activites Pain improves with: heat/ice and medication Relief from Meds: 5  Mobility ability to climb steps?  yes do you drive?  no transfers alone  Function disabled: date disabled . retired  Neuro/Psych trouble walking  Prior Studies Any changes since last visit?  no  Physicians involved in your care Any changes since last visit?  no   Family History  Problem Relation Age of Onset  . Other      negative for premature CAD.  Marland Kitchen Aneurysm Father    History   Social History  . Marital Status: Married    Spouse Name: paul    Number of Children: 3  . Years of Education: 12   Occupational History  . retired    Social History Main Topics  . Smoking status: Former Smoker    Quit date: 11/26/1968  . Smokeless tobacco: None  . Alcohol Use: Yes  . Drug Use: No  . Sexual Activity: None   Other Topics Concern  . None   Social History Narrative   Lives in [place] by herself.   Daughter lives in [place].   Past Surgical History  Procedure Laterality Date  . Pacemaker insertion      MDT implanted at [facility]  . Abdominal hysterectomy    . Breast lumpectomy    . Appendectomy    . Bowel resection    . Lead revision  09/17/11    s/p atrial lead revision   Past Medical History  Diagnosis Date  . Intracerebral hemorrhage     a. 04/2013 in setting of xarelto therapy.  . Atrial fibrillation 2012    a. Dx in 2012;  b. Rhythm controlled - amiodarone, previously anticoagulated with xarelto (d/c'd 04/2013 in setting of [place]);  b. 04/2013 Echo: EF 55-60%, Gr 1 DD, mild MR, mod dil LA, PAsP 72mmhg  . Hypertension   . Depression   . Arthritis   . Presence of permanent cardiac pacemaker     a. 2012 MDT, placed in [place] (probable tachy-brady).  . Pacemaker   . SSS (sick sinus syndrome)     s/p dual chamber pacemaker  . PAF (paroxysmal atrial fibrillation)   . Aphasia     Occasional aphasia & facial numbness from a left frontal lobe intercerebral hemorrhage on Xarelto on 05/18/13.  Marland Kitchen Pericardial effusion   . Mitral valve prolapse   . Hyperlipidemia   .  Rheumatoid arthritis   . SOB (shortness of breath)     Occasional SOB  . Palpitations     Occasional.  . Peripheral edema     Because she has been standing on her feet more than usual    There were no vitals taken for this visit.  Opioid Risk Score:   Fall Risk Score: Moderate Fall Risk (6-13 points) (pamphlet given to patient today) Review of Systems  Constitutional:       Trouble walking  Neurological: Positive for headaches.  All other systems reviewed and are negative.      Objective:   Physical Exam  Constitutional: She is oriented to person, place, and time. She appears well-developed and well-nourished.  HENT:  Head: Normocephalic and atraumatic.  Eyes: Conjunctivae and EOM are normal. Pupils are equal, round, and reactive to light. Right eye exhibits no discharge. Left eye exhibits no discharge. No  scleral icterus.  Neck: Normal range of motion. Neck supple.  Cardiovascular: Normal rate and regular rhythm. Exam reveals no gallop and no friction rub.  No murmur heard. No obvious edema on exam.  Respiratory: Effort normal. No respiratory distress. She has no wheezes.  GI: Soft. Bowel sounds are normal. She exhibits no distension. There is no tenderness.  Musculoskeletal: thoracic kyphosis, head forward position. Shoulders still rounded.  less tenderness in shoulder girdle.  Hamstrings tight. Right hemi-pelvis 1/4 inch lower than left. Lumbar paraspinals tight also.   Neurological: She is drowsy, distracted, confused. Limited insight and awareness.  Right facial weakness improved. Speech clearer. Language is less apraxic. Continued word finding deficits. Comprehension is fair to good.  alert. Follows simple commands. Improved insight and awareness. Oriented to place, month, year with cues. Moves all 4's fairly equally now Gait is improved with a slight posterior lean still present. Tandem gait caused a little balance loss to the left.  Skin: Skin is warm and dry.  A few limb bruises noted  Psychiatric: pleasant, appropriate, carried a conversation despite language issues. Good insight and awareness overall   Assessment/Plan:  1. Functional deficits secondary to left frontal hematoma with seizure activity  ----continue HEP  2. Lumbar spondylosis--lidoderm patches prn, tylenol,much improved  3.Cervicalgia---myofascial pain, underlying spondylosis potentially as well---triggers headaches  -xrays of c-spine with ddd/spondylosis -continue with HEP -tylenol, ice/heat  -if pain should worsen we could consider further work up or injections. I think her pain can me managed conservatively however. 4. CV: PER PRIMARY 7. Seizure: on keppra bid  Follow up with me prn.  15 minutes of face to face patient care time were spent during this visit. All questions were encouraged and answered.

## 2014-05-22 NOTE — Therapy (Signed)
Physical Therapy Treatment  Patient Details  Name: Ann Kaufman MRN: [id number] Date of Birth: 09/16/1929  Encounter Date: 05/22/2014      PT End of Session - 05/22/14 1558    Visit Number 7   Number of Visits 10   Date for PT Re-Evaluation 05/25/14   PT Start Time 1401   PT Stop Time 1444   PT Time Calculation (min) 43 min   Equipment Utilized During Treatment Gait belt   Activity Tolerance Patient tolerated treatment well   Behavior During Therapy [facility] for tasks assessed/performed      Past Medical History  Diagnosis Date  . Intracerebral hemorrhage     a. 04/2013 in setting of xarelto therapy.  . Atrial fibrillation 2012    a. Dx in 2012;  b. Rhythm controlled - amiodarone, previously anticoagulated with xarelto (d/c'd 04/2013 in setting of [place]);  b. 04/2013 Echo: EF 55-60%, Gr 1 DD, mild MR, mod dil LA, PAsP 46mhg  . Hypertension   . Depression   . Arthritis   . Presence of permanent cardiac pacemaker     a. [place]).  . Pacemaker   . SSS (sick sinus syndrome)     s/p dual chamber pacemaker  . PAF (paroxysmal atrial fibrillation)   . Aphasia     Occasional aphasia & facial numbness from a left frontal lobe intercerebral hemorrhage on Xarelto on 05/18/13.  .Marland KitchenPericardial effusion   . Mitral valve prolapse   . Hyperlipidemia   . Rheumatoid arthritis   . SOB (shortness of breath)     Occasional SOB  . Palpitations     Occasional.  . Peripheral edema     Because she has been standing on her feet more than usual     Past Surgical History  Procedure Laterality Date  . Pacemaker insertion      [facility] . Ab[facility]rectomy    . Breast lumpectomy    . Appendectomy    . Bowel resection    . Lead revision  09/17/11    s/p atrial lead revision    There were no vitals taken for this visit.  Visit Diagnosis:  Abnormality of gait      Subjective Assessment - 05/22/14 1409    Symptoms Pt reported  she saw her MD today and he told her that L hip pain is most likely radiating from her low back, pt also reported imagining negative. Pt denied falls since last visit. Pt has been exercising daily.   Currently in Pain? No/denies            [facility] - 05[facility]22/14 1401    Transfers   Transfers Sit to Stand;Stand to Sit   Sit to Stand 7: Independent;With upper extremity assist;From chair/3-in-1   Sit to Stand Details (indicate cue type and reason) Pt demonstrated safe technique.   Stand to Sit 7: Independent;With armrests;To chair/3-in-1   Stand to Sit Details No cues   Ambulation/Gait   Ambulation/Gait Yes   Ambulation/Gait Assistance 7: Independent;5: Supervision   Ambulation/Gait Assistance Details Pt ambulated indoors over even/uneven terrain (raining outside) at an independent level, with no LOB episodes.  Pt required supervision to ascend/descend curb for safety, pt reported she does not have to traverse curbs at home.   Ambulation Distance (Feet) 600 Feet   Assistive device None   Gait Pattern Step-through pattern;Narrow base of support  posterior trunk lean.   Dynamic Gait  Index   Level Surface Normal   Change in Gait Speed Mild Impairment   Gait with Horizontal Head Turns Normal   Gait with Vertical Head Turns Normal   Gait and Pivot Turn Normal   Step Over Obstacle Mild Impairment   Step Around Obstacles Normal   Steps Mild Impairment   Total Score 21    Self care:  Pt verbalized home management of tension headaches using heat and ice modalities and stretching.  Therex: pt performed all therex independently without requiring cues. Supine L piriformis stretch with 30 second hold x3. Seated shoulder rolls x10. Seated B cervical lateral flexion with 5 seconds hold x5/side. Side-stepping at counter with and without UE support 7'x2. Backwards ambulation at counter with one UE support 7'x2.      PT Education - 05/22/14 1557    Education provided  Yes   Education Details Falls prevention handout   Person(s) Educated Patient   Methods Explanation   Comprehension Verbalized understanding          PT Short Term Goals - 05/22/14 1600    PT SHORT TERM GOAL #1   Title Pt will be able to: Perform HEP with minimal cueing from PT for accuracy.   Baseline Patient verbalized compliance. Instructed to bring HEP next session to review individually for accuracy.   Time 1   Period Weeks   Status Achieved   PT SHORT TERM GOAL #2   Title Pt will be able to: Improve DGI score to 21/24 (05/05/14)   Time 1   Period Weeks   Status Achieved   PT SHORT TERM GOAL #3   Title Pt will be able to: Further assess gait for community distances to ensure safety with uneven terrain   Status Achieved   PT SHORT TERM GOAL #4   Title Pt will be able to: Navigate 4 steps modified independent with reciprocal gait   Baseline Not met on 05/08/14   Time 1   Period Weeks   Status Achieved   PT SHORT TERM GOAL #5   Title Pt will be able to: Improve neck lateral flexion to 24 degrees bilaterally.   Baseline Not met on 05/08/14 (R) lateral flexion 13; (L) lateral flexion 21   Time 1   Period Weeks   Status On-going          PT Long Term Goals - 05/22/14 1600    PT LONG TERM GOAL #1   Title Pt will be able to: verbalize understanding of: fall prevention strategies within home environment.   Time --  05/25/14   Status Achieved   PT LONG TERM GOAL #2   Title Pt will be able to: Perform HEP with independence   Time --  05/25/14   Status Achieved   PT LONG TERM GOAL #3   Title Pt will be able to: Improve DGI score to 21/24   Baseline pt scored 18/24 on 05/05/14.   Time --  05/25/14   Status Achieved   PT LONG TERM GOAL #4   Title Pt will be able to: Verbalize understanding of home management of tension headaches per stretching, hot/cold modality use as needed.    Time --  05/25/14   Status Achieved   PT LONG TERM GOAL #5   Title Pt will ambulate  500' independently, over even/uneven terrains (in/outdoors), while performing head turns.   Time --  05/25/14   Status Achieved          Plan - 05/22/14  1558    Clinical Impression Statement PT is discharging pt today, as pt met 5/5 LTGs. It is unknown if pt met cervical ROM ongoing STG, as PT did not assess. See discharge summary for details.          G-Codes - Jun 11, 2014 1605    Functional Assessment Tool Used DGI: 21/24   Functional Limitation Mobility: Walking and moving around   Mobility: Walking and Moving Around Goal Status [phone]) At least 1 percent but less than 20 percent impaired, limited or restricted   Mobility: Walking and Moving Around Discharge Status [phone]) At least 1 percent but less than 20 percent impaired, limited or restricted      Problem List Patient Active Problem List   Diagnosis Date Noted  . Headache 04/06/2014  . Cervicalgia 03/20/2014  . Myofascial muscle pain 03/20/2014  . Sick sinus syndrome 10/19/2013  . TIA (transient ischemic attack) 07/24/2013  . UTI (lower urinary tract infection) 07/24/2013  . Seizures 07/24/2013  . Delirium of mixed origin 06/09/2013  . Altered mental status 06/03/2013  . PAF (paroxysmal atrial fibrillation) 05/30/2013  . Syncope 05/30/2013  . Pacemaker 05/30/2013  . Acute respiratory failure 05/19/2013  . Chronic anticoagulation 05/19/2013  . Aspiration pneumonia 05/19/2013  . ICH (intracerebral hemorrhage) 05/18/2013  . Dyspnea on exertion 04/06/2013  . Atrial fibrillation, chronic 04/06/2013  . Atherosclerosis of aorta 04/06/2013  . Hypokalemia 11/28/2011  . Heart palpitations 11/27/2011  . Pleural effusion 11/27/2011  . Atrial fibrillation with RVR 11/27/2011  . HTN (hypertension) 11/27/2011     PHYSICAL THERAPY DISCHARGE SUMMARY  Visits from Start of Care: 7  Current functional level related to goals / functional outcomes: See above goals--pt met 5/5 LTGs.   Remaining deficits: Postural deficits  with rounded shoulders and forward head, h/o back and L hip pain that hinders long distance ambulation at times in therapy, decreased high level balance and dynamic gait activities.   Education / Equipment: Pt has been provided comprehensive HEP, home fall prevention handout and recommendations for safe activity post d/c from P.T.  Plan: Patient agrees to discharge.  Patient goals were met. Patient is being discharged due to meeting the stated rehab goals.  Thank you for the referral of this patient. PT d/c summary provided by WEAVER,CHRISTINA, PT                                              Houda Brau L 06/11/14, 4:06 PM   Geoffry Paradise, PT,DPT 06/11/14 4:07 PM Phone: [phone] Fax: [phone]

## 2014-05-22 NOTE — Patient Instructions (Addendum)
CONTINUE TO WORK ON YOUR POSTURE, RANGE OF MOTION, STRETCHING EXERCISES. "STAND TALL AND WALK TALL" POOL WOULD BE A GREAT SPOT TO WORK ON YOUR STRENGTH, POSTURE, AND STAMINA UTILIZE HEAT, ICE, MASSAGE AS WELL.  PRACTICE STRETCHES YOU WERE TAUGHT BY THERAPY!!  TYLENOL FOR BREAKTHROUGH PAIN---UP 2000MG  TYLENOL PER DAY

## 2014-05-22 NOTE — Patient Instructions (Signed)

## 2014-05-24 ENCOUNTER — Encounter: Payer: Self-pay | Admitting: Cardiology

## 2014-05-29 ENCOUNTER — Encounter: Payer: Self-pay | Admitting: Internal Medicine

## 2014-06-06 ENCOUNTER — Ambulatory Visit: Payer: Medicare Other | Admitting: Neurology

## 2014-06-06 DIAGNOSIS — E785 Hyperlipidemia, unspecified: Secondary | ICD-10-CM | POA: Diagnosis not present

## 2014-06-06 DIAGNOSIS — R8299 Other abnormal findings in urine: Secondary | ICD-10-CM | POA: Diagnosis not present

## 2014-06-06 DIAGNOSIS — R946 Abnormal results of thyroid function studies: Secondary | ICD-10-CM | POA: Diagnosis not present

## 2014-06-06 DIAGNOSIS — I10 Essential (primary) hypertension: Secondary | ICD-10-CM | POA: Diagnosis not present

## 2014-06-12 DIAGNOSIS — Z950 Presence of cardiac pacemaker: Secondary | ICD-10-CM | POA: Diagnosis not present

## 2014-06-12 DIAGNOSIS — E785 Hyperlipidemia, unspecified: Secondary | ICD-10-CM | POA: Diagnosis not present

## 2014-06-12 DIAGNOSIS — F418 Other specified anxiety disorders: Secondary | ICD-10-CM | POA: Diagnosis not present

## 2014-06-12 DIAGNOSIS — I4891 Unspecified atrial fibrillation: Secondary | ICD-10-CM | POA: Diagnosis not present

## 2014-06-12 DIAGNOSIS — Z0000 Encounter for general adult medical examination without abnormal findings: Secondary | ICD-10-CM | POA: Diagnosis not present

## 2014-06-12 DIAGNOSIS — R312 Other microscopic hematuria: Secondary | ICD-10-CM | POA: Diagnosis not present

## 2014-06-12 DIAGNOSIS — I509 Heart failure, unspecified: Secondary | ICD-10-CM | POA: Diagnosis not present

## 2014-06-12 DIAGNOSIS — I10 Essential (primary) hypertension: Secondary | ICD-10-CM | POA: Diagnosis not present

## 2014-06-25 ENCOUNTER — Other Ambulatory Visit: Payer: Self-pay | Admitting: Nurse Practitioner

## 2014-07-13 ENCOUNTER — Ambulatory Visit (INDEPENDENT_AMBULATORY_CARE_PROVIDER_SITE_OTHER): Payer: Medicare Other | Admitting: Neurology

## 2014-07-13 ENCOUNTER — Encounter: Payer: Self-pay | Admitting: Neurology

## 2014-07-13 VITALS — BP 107/71 | HR 87 | Ht 64.0 in | Wt 173.0 lb

## 2014-07-13 DIAGNOSIS — R569 Unspecified convulsions: Secondary | ICD-10-CM | POA: Diagnosis not present

## 2014-07-13 NOTE — Patient Instructions (Signed)
I had a long discussion with the patient, daughter and sister about her headaches which seem to have significantly resolved as well as prior intracerebral hemorrhage, risk for recurrent stroke, risk factor modification and answered questions. I recommend she taper the Depakote gradually over the next 2 weeks and discontinue it. Continue Keppra for seizures and aspirin for stroke prevention for atrial fibrillation. Strict control of hypertension with blood pressure goal below 130/90 and lipids with LDL cholesterol goal below 100 mg percent. Return for follow-up in 3 months or call earlier if necessary

## 2014-07-14 NOTE — Progress Notes (Signed)
PATIENT: Ann Kaufman DOB: Feb 01, 1930  REASON FOR VISIT: routine follow up for ICH HISTORY FROM: patient  HISTORY OF PRESENT ILLNESS: Ann Kaufman is an 81 y.o. female with a history of a ICH on 05/18/2013 while on Xarelto for atrial fibrillation. Lives independently @ baseline. Brought by EMS to [facility] ED as code stroke and seizure. Intubated due to AMS with compromised airway and resp distress. CT head revealed Left frontal lobe intraparenchymal hematoma with intraventricular hemorrhage within the left lateral ventricle as above. Hemorrhagic infarct with intraventricular extension is suspected. No midline shift or hydrocephalus identified. Carotid Duplex Findings suggested 1-39% internal carotid artery stenosis bilaterally. She was with her daughter on 07/24/12 and while the two were walking her daughter noted neurological changes. Patient has a mild aphasia at baseline. Acutely though the patient became unable to speak and was having extensive difficulty getting her words out. Was also noted to have right upper extremity weakness as well. Patient remained ambulatory. Was brought to [facility] where a code stroke was called. Daughter reports that the patient's symptoms lasted about an hour and then resolved completely.  Today is her first visit to the office post-hospital. She is feeling well, back to living independently, with no residual deficits other than mild aphasia, which she had at baseline. BP is well controlled, it is 126/76 in office today. She is tolerating daily aspirin well without side effects, and is tolerating Keppra BID without complication. She has had no recurrent seizures. She has had a couple instances with irregularity in her peripheral vision, which resolved within moments. She has an eye evaluation appointment in 2 weeks.   UPDATE 12/12/13 (LL): Since last visit, patient is doing well.  Her eye evaluation was normal after last visit. She is tolerating daily aspirin well without side  effects, and is tolerating Keppra BID without complication. She has had no recurrent seizures.   Blood pressure is well controlled, it is 127/67 in the office today. Update 04/06/2014 : She returns for followup her last visit 4 months ago. She is a complaint by her daughter. She has new complaint of intermittent headaches. She is a poor historian and the daughter has to fill in most of the details. She has been having intermittent headaches for the last 3 months. These occurred a variable frequency but perhaps at least 2 or 3 times per week. The headaches are mostly in the occipital region but bifrontal also occasionally. She describes this as moderate to severe at 9/10 in severity. Mostly aching in nature but occasionally throbbing. There is not associated nausea ,vomiting  But there is slight light and sound sensitivity and she feels better if she turns down the music and the like. She cannot tell me his physical activity has any relationship with the headache. She does have remote history of migraine headaches in the 1970s but she had outgrown them. She denies any recent fall with head injury though she didn't fall and bruised her leg a month ago. She continues to take Keppra and has not had any breakthrough seizures. Her blood pressure is under good control and is 123/6 for an office today she denies any blurred vision, loss of vision, scalp tenderness, jaw claudication or myalgias or arthralgias. Update 07/13/2014 : She returns for follow-up after last visit 3 months ago accompanied by sister and daughter. She states her headaches are significantly better and she rarely has them and takes Tylenol which seems to work. She has not had any further seizures.  She remains on Keppra as she is tolerating well without any side effects. Had a CT scan of the head on 04/11/14 which I have personally reviewed and does not show any acute abnormalities. Remote age encephalomalacia and gliosis from prior intracerebral  hemorrhage is noted in the left frontal region as well as mild changes of small vessel disease and generalized cerebral atrophy. Lab results from 04/06/14 show normal metabolic panel lab , ESR except marginally elevated creatinine of 1.$RemoveBeforeD'19mg'vdcgwycHDcPbsB$ % REVIEW OF SYSTEMS: Full 14 system review of systems performed and notable only for: Drooling, leg swelling, constipation, environmental allergies, bladder urgency, back pain, aching muscles, headache, speech difficulty and easy bruising all other systems negative    ALLERGIES: Allergies  Allergen Reactions  . Latex     HOME MEDICATIONS: Outpatient Prescriptions Prior to Visit  Medication Sig Dispense Refill  . ALPRAZolam (XANAX) 0.25 MG tablet Take 0.25 mg by mouth at bedtime as needed for anxiety.    Marland Kitchen amiodarone (PACERONE) 200 MG tablet Take 1 tablet (200 mg total) by mouth daily. 90 tablet 2  . aspirin EC 81 MG EC tablet Take 1 tablet (81 mg total) by mouth daily.    . famotidine (PEPCID) 20 MG tablet Take 1 tablet (20 mg total) by mouth 2 (two) times daily.    . folic acid (FOLVITE) 562 MCG tablet Take 400 mcg by mouth every evening.    . furosemide (LASIX) 40 MG tablet Take 40 mg by mouth daily.    Marland Kitchen KEPPRA 500 MG tablet TAKE 1 TABLET TWICE A DAY 180 tablet 1  . metoprolol tartrate (LOPRESSOR) 25 MG tablet Take 1 tablet (25 mg total) by mouth 2 (two) times daily. 60 tablet 1  . omeprazole (PRILOSEC) 20 MG capsule Take 1 capsule (20 mg total) by mouth every morning. 30 capsule 1  . potassium chloride SA (K-DUR,KLOR-CON) 20 MEQ tablet Take 1 tablet (20 mEq total) by mouth daily. 30 tablet 3  . senna (SENOKOT) 8.6 MG TABS tablet Take 2 tablets (17.2 mg total) by mouth at bedtime. 120 each 0  . sertraline (ZOLOFT) 50 MG tablet Take 1 tablet (50 mg total) by mouth at bedtime. 90 tablet 3  . simvastatin (ZOCOR) 10 MG tablet Take 1 tablet (10 mg total) by mouth every evening. 15 tablet 0  . vitamin B-12 (CYANOCOBALAMIN) 1000 MCG tablet Take 1 tablet  (1,000 mcg total) by mouth every evening. 30 tablet 1  . Vitamin D, Ergocalciferol, (DRISDOL) 50000 UNITS CAPS capsule Take 1 capsule (50,000 Units total) by mouth every 14 (fourteen) days. 4 capsule 1  . divalproex (DEPAKOTE ER) 500 MG 24 hr tablet Take 1 tablet (500 mg total) by mouth daily. 60 tablet 1   No facility-administered medications prior to visit.     PHYSICAL EXAM  Filed Vitals:   07/13/14 1409  BP: 107/71  Pulse: 87  Height: $Remove'5\' 4"'BcwTWkL$  (1.626 m)  Weight: 173 lb (78.472 kg)   Body mass index is 29.68 kg/(m^2). No exam data present  Generalized: Well developed, in no acute distress  Head: normocephalic and atraumatic. Oropharynx benign  Neck: Supple, no carotid bruits  Cardiac: Regular rate rhythm, no murmur  Musculoskeletal: No deformity   Neurological examination  Mentation: Alert oriented to time, place, history taking. Follows all commands speech and language with mild expressive aphasia and word-finding difficulties.  Cranial nerve II-XII: Pupils were equal round reactive to light extraocular movements were full, visual field were full on confrontational test. Facial sensation and strength were  normal. hearing was intact to finger rubbing bilaterally. Uvula tongue midline. head turning and shoulder shrug and were normal and symmetric.Tongue protrusion into cheek strength was normal.  Motor: The motor testing reveals 5 over 5 strength of all 4 extremities. Good symmetric motor tone is noted throughout. Diminished fine finger movements on right and orbits left over right upper extremity Sensory: Sensory testing is intact to pinprick, soft touch, vibration sensation, and position sense on all 4 extremities. No evidence of extinction is noted.  Coordination: Cerebellar testing reveals good finger-nose-finger and heel-to-shin bilaterally.  Gait and station: Gait is normal. Tandem gait is  Slightly abnormal. Romberg is negative. No drift is seen.  Reflexes: Deep tendon reflexes  are symmetric and normal bilaterally. Toes are downgoing bilaterally.   ASSESSMENT AND PLAN Ann Kaufman is a 81 y.o. female presented with a left frontal intracranial hemorrhage on 05/18/13. The patient was on Xarelto prior to admission. Seizure at the time of presentation. She later presented with aphasia and RUE hemiparesis that resolved on 07/24/12. Left brain infarct vs   Mixed muscle tension headaches with some migrainous features. Which have improved  PLAN:   I  had a long discussion with the patient, daughter and sister about her headaches which seem to have significantly resolved as well as prior intracerebral hemorrhage, risk for recurrent stroke, risk factor modification and answered questions. I recommend she taper the Depakote gradually over the next 2 weeks and discontinue it. Continue Keppra for seizures and aspirin for stroke prevention for atrial fibrillation. Strict control of hypertension with blood pressure goal below 130/90 and lipids with LDL cholesterol goal below 100 mg percent. Return for follow-up in 3 months or call earlier if necessary  Antony Contras, MD  07/14/2014, 9:14 PM [facility] [address], [place], [place] [address] [phone]  Note: This document was prepared with digital dictation and possible smart phrase technology. Any transcriptional errors that result from this process are unintentional.

## 2014-07-18 DIAGNOSIS — R312 Other microscopic hematuria: Secondary | ICD-10-CM | POA: Diagnosis not present

## 2014-07-18 DIAGNOSIS — N3946 Mixed incontinence: Secondary | ICD-10-CM | POA: Diagnosis not present

## 2014-07-21 ENCOUNTER — Other Ambulatory Visit: Payer: Self-pay | Admitting: Neurology

## 2014-07-26 ENCOUNTER — Ambulatory Visit: Payer: Medicare Other | Admitting: Neurology

## 2014-08-07 DIAGNOSIS — D7389 Other diseases of spleen: Secondary | ICD-10-CM | POA: Diagnosis not present

## 2014-08-07 DIAGNOSIS — N281 Cyst of kidney, acquired: Secondary | ICD-10-CM | POA: Diagnosis not present

## 2014-08-07 DIAGNOSIS — N200 Calculus of kidney: Secondary | ICD-10-CM | POA: Diagnosis not present

## 2014-08-07 DIAGNOSIS — R312 Other microscopic hematuria: Secondary | ICD-10-CM | POA: Diagnosis not present

## 2014-08-09 ENCOUNTER — Ambulatory Visit (INDEPENDENT_AMBULATORY_CARE_PROVIDER_SITE_OTHER): Payer: Medicare Other | Admitting: *Deleted

## 2014-08-09 ENCOUNTER — Telehealth: Payer: Self-pay | Admitting: Cardiology

## 2014-08-09 DIAGNOSIS — I495 Sick sinus syndrome: Secondary | ICD-10-CM | POA: Diagnosis not present

## 2014-08-09 NOTE — Telephone Encounter (Signed)
Spoke with pt and reminded pt of remote transmission that is due today. Pt verbalized understanding.   

## 2014-08-10 LAB — MDC_IDC_ENUM_SESS_TYPE_REMOTE
Battery Impedance: 255 Ohm
Brady Statistic AP VP Percent: 0 %
Brady Statistic AP VS Percent: 100 %
Brady Statistic AS VP Percent: 0 %
Date Time Interrogation Session: 20160218012814
Lead Channel Impedance Value: 501 Ohm
Lead Channel Impedance Value: 508 Ohm
Lead Channel Pacing Threshold Pulse Width: 0.4 ms
Lead Channel Pacing Threshold Pulse Width: 0.6 ms
Lead Channel Sensing Intrinsic Amplitude: 5.6 mV
Lead Channel Setting Pacing Amplitude: 1.5 V
Lead Channel Setting Pacing Amplitude: 3 V
Lead Channel Setting Sensing Sensitivity: 2 mV
MDC IDC MSMT BATTERY REMAINING LONGEVITY: 116 mo
MDC IDC MSMT BATTERY VOLTAGE: 2.79 V
MDC IDC MSMT LEADCHNL RA PACING THRESHOLD AMPLITUDE: 0.5 V
MDC IDC MSMT LEADCHNL RV PACING THRESHOLD AMPLITUDE: 1.5 V
MDC IDC SET LEADCHNL RV PACING PULSEWIDTH: 0.4 ms
MDC IDC STAT BRADY AS VS PERCENT: 0 %

## 2014-08-10 NOTE — Progress Notes (Signed)
Remote pacemaker transmission.   

## 2014-08-11 ENCOUNTER — Other Ambulatory Visit: Payer: Self-pay | Admitting: Internal Medicine

## 2014-08-15 DIAGNOSIS — N3946 Mixed incontinence: Secondary | ICD-10-CM | POA: Diagnosis not present

## 2014-08-15 DIAGNOSIS — R312 Other microscopic hematuria: Secondary | ICD-10-CM | POA: Diagnosis not present

## 2014-08-15 DIAGNOSIS — N200 Calculus of kidney: Secondary | ICD-10-CM | POA: Diagnosis not present

## 2014-08-21 ENCOUNTER — Encounter: Payer: Self-pay | Admitting: Cardiology

## 2014-08-24 ENCOUNTER — Other Ambulatory Visit: Payer: Self-pay | Admitting: Neurology

## 2014-08-28 ENCOUNTER — Encounter: Payer: Self-pay | Admitting: Internal Medicine

## 2014-09-28 DIAGNOSIS — N3946 Mixed incontinence: Secondary | ICD-10-CM | POA: Diagnosis not present

## 2014-09-30 ENCOUNTER — Other Ambulatory Visit: Payer: Self-pay | Admitting: Internal Medicine

## 2014-10-15 NOTE — Consult Note (Signed)
Chief Complaint:   Subjective/Chief Complaint Pt states to be doing better today. No further shocks. Still has some palps.   VITAL SIGNS/ANCILLARY NOTES: **Vital Signs.:   24-Mar-13 07:58   Vital Signs Type Routine   Temperature Temperature (F) 97.4   Celsius 36.3   Temperature Source axillary   Pulse Pulse 61   Pulse source per Dinamap   Respirations Respirations 18   Systolic BP Systolic BP 737   Diastolic BP (mmHg) Diastolic BP (mmHg) 79   Mean BP 102   BP Source Dinamap   Pulse Ox % Pulse Ox % 96   Pulse Ox Activity Level  At rest   Oxygen Delivery Room Air/ 21 %  *Intake and Output.:   Daily 24-Mar-13 07:00   Grand Totals Intake:   Output:  525    Net:  -35 24 Hr.:  -525   Urine ml     Out:  525   Length of Stay Totals Intake:   Output:  925    Net:  -925   Brief Assessment:   Cardiac Regular  murmur present  -- LE edema  -- JVD    Respiratory normal resp effort  clear BS    Gastrointestinal Normal    Gastrointestinal details normal Soft  Nontender  Nondistended  No masses palpable  Bowel sounds normal   Cardiac:  23-Mar-13 04:32    Troponin I 0.10    13:02    Troponin I 0.10   Radiology Results: XRay:    22-Mar-13 21:20, Chest Portable Single View   Chest Portable Single View    REASON FOR EXAM:    Chest Pain  COMMENTS:       PROCEDURE: DXR - DXR PORTABLE CHEST SINGLE VIEW  - Sep 12 2011  9:20PM     RESULT: Comparison is made to the study of 09 September 2011.    The lungs are well-expanded. The interstitial markings are slightly   increased but appear stable since the earlier study. There is a permanent   pacemaker in place. The cardiac silhouette is normal in size. The   pulmonary vascularity is not clearly engorged. There is no pleural   effusion. I see no pneumothorax nor pneumomediastinum. The mediastinum is   normal in width.    IMPRESSION:  I do not see definite evidence of acute cardiopulmonary     abnormality. Mildly increased  interstitial markings are stable and may   reflect interstitial edema of cardiac or noncardiac cause.          Verified By: DAVID A. Martinique, M.D., MD  [place]:    22-Mar-13 23:40, [place] Color Flow Doppler Upper Extrem Left   [place] Color Flow Doppler Upper Extrem Left    REASON FOR EXAM:    pain at axilla  post procedure  COMMENTS:       PROCEDURE: [place]  - US DOPPLER UP EXTR LEFT  - Sep 12 2011 11:40PM     RESULT: The deep venous system of the left upper tremor T was evaluated.   The Doppler waveforms as well as color flow images from the basilic and   cephalic veins through the distal internal jugular vein on the left are   normal. I see no finding to suggest an obstructive are nonobstructed   thrombus.    IMPRESSION:  I do not see evidence of thrombus within the deep venous   system of the left upper extremity.        Verified By: Shanon Brow  A. Martinique, M.D., MD   Assessment/Plan:  Invasive Device Daily Assessment of Necessity:   Does the patient currently have any of the following indwelling devices? none   Assessment/Plan:   Assessment IMP SSS HTN Atypical CP Malfunctioning PPM AFIB hx Ecchymosis    Plan PLAN Tele S/P PPM interrogation Pt programmed to AAI Hopefully d/c home today F/U cardiology in am Will need repositioning of V lead as outpt Case discussed with AP/[employer] /[employer] rep/Pt   Electronic Signatures: Lujean Amel D (MD)  (Signed 24-Mar-13 12:20)  Authored: Chief Complaint, VITAL SIGNS/ANCILLARY NOTES, Brief Assessment, Lab Results, Radiology Results, Assessment/Plan   Last Updated: 24-Mar-13 12:20 by Yolonda Kida (MD)

## 2014-10-15 NOTE — Discharge Summary (Signed)
PATIENT NAME:  [name], Ann Kaufman MR#:  [id number] DATE OF BIRTH:  1929/12/04  DATE OF ADMISSION:  09/13/2011 DATE OF DISCHARGE:  09/14/2011  ADMITTING PHYSICIAN: Wilfred Curtis, MD  DISCHARGING PHYSICIAN: Deanne Coffer, MD  PRIMARY CARE PHYSICIAN: Margarita Rana, MD  PRIMARY CARDIOLOGIST: Isaias Cowman, MD   ADMITTING DIAGNOSIS: Left-sided chest pain.   DISCHARGE DIAGNOSES:  1. Left-sided chest pain secondary to pacemaker failure and misfiring.  2. Mild elevation of troponin, again from pacemaker dysfunction. 3. Atrial fibrillation, sick sinus syndrome. 4. Hyponatremia. 5. Psoriasis.  CONSULTANTS: Lujean Amel, MD.  TESTS DONE DURING THIS HOSPITALIZATION: Chest x-ray 09/12/2011 showed no definite evidence of acute cardiopulmonary abnormality.   Ultrasound Dopplers of left upper extremity showed no evidence of thrombus within the deep venous system of the left upper extremity.   HOSPITAL COURSE: Initial history and physical were done by Dr. Lenore Manner. Please refer to his note dated 09/13/2011. In brief, this is a 81 year old white female with past medical history of atrial fibrillation and sick sinus syndrome who underwent pacemaker insertion on 09/09/2011 by Dr. Saralyn Pilar who developed left-sided pain in her ribs. It felt like her pacemaker was firing inappropriately. She had mildly elevated troponin.  1. Chest pain: The patient was evaluated by Dr. Clayborn Bigness. It was thought to be due to pacemaker misfiring from possible lead dislodging. [employer] representatives interrogated it and reprogrammed to AAI. The patient is to followup with Dr. Saralyn Pilar, but continue her home medications of aspirin.  2. Mild elevation of troponin: This was most likely from pacemaker misfiring. 3. Atrial fibrillation, sick sinus syndrome: This was well controlled.  4. Hyponatremia: This was likely related to hydrochlorothiazide.  5. Psoriasis: This is stable.  6. Deep vein thrombosis prophylaxis: She was  maintained with aspirin, TED stockings, and SCDs.   The patient was discharged on 09/14/2011. Temperature is 97.4, heart rate 61, respiratory rate 18, blood pressure 149/79, and saturating 96% on room air. Lungs are clear to auscultation. Heart has regular rate and rhythm. Abdomen is benign.   DISCHARGE MEDICATIONS:  1. Methotrexate 2.5 mg six tablets once a week. 2. Metoprolol 25 mg twice a day.  3. Hydrochlorothiazide 12.5 mg daily.  4. Nexium 40 mg daily.  5. Sertraline 50 mg daily.  6. Aspirin 81 mg daily. 7. Folic acid 0.4 mg daily  8. Vitamin D 50,000 international units every two weeks.  9. B12 1000 mg daily.  10. Keflex 750 mg daily, as prescribed by Dr. Saralyn Pilar.   DIET: Low sodium, low fat diet.  ACTIVITY: No heavy lifting.  DISCHARGE INSTRUCTIONS: See Dr. Saralyn Pilar this week and Dr. Venia Minks in 7 to 10 days.           CODE STATUS: FULL CODE.  TOTAL TIME SPENT ON DISCHARGE: 40 minutes.  ____________________________ Judeth Horn Royden Purl, MD aaf:slb D: 09/15/2011 11:46:11 ET T: 09/15/2011 11:54:17 ET JOB#: [id number]  cc: Mike Craze A. Royden Purl, MD, <Dictator> Jerrell Belfast, MD Dwayne D. Clayborn Bigness, MD Isaias Cowman, MD Joaquin Bend MD ELECTRONICALLY SIGNED 09/15/2011 16:57

## 2014-10-15 NOTE — Op Note (Signed)
PATIENT NAME:  [name], Ann Kaufman MR#:  [id number] DATE OF BIRTH:  [birth year]  DATE OF PROCEDURE:  09/09/2011  PRIMARY CARE PHYSICIAN: Margarita Rana, MD  PREPROCEDURE DIAGNOSES:  1. Sick sinus syndrome. 2. Atrial fibrillation.   PROCEDURE: Dual chamber pacemaker implantation.   POSTPROCEDURAL DIAGNOSIS: Atrial sensing with ventricular pacing.   INDICATION: The patient is a 81 year old female with history of paroxysmal atrial fibrillation alternating with sinus bradycardia consistent with sick sinus syndrome. The patient recently was started on Multaq to control atrial fibrillation. She was seen by Dr. Pilar Jarvis from [facility] electrophysiology group who recommended that the patient have a pacemaker instead of catheter ablation of atrial fibrillation. The procedure, risks, benefits, and alternatives of pacemaker implantation were explained to the patient and informed written consent was obtained.   DESCRIPTION OF PROCEDURE: She was brought to the Operating Room in a fasting state. The left pectoral region was prepped and draped in the usual sterile manner. Anesthesia was obtained with 1% Xylocaine locally. A 6 cm incision was performed over the left pectoral region. The pacemaker pocket was generated by electrocautery and blunt dissection. Access was obtained to the left subclavian vein by fine needle aspiration. A [employer] screw-in lead was positioned into the right ventricular apex. A [employer] passive atrial J-lead was positioned into the right atrial appendage. After proper thresholds were obtained, and the leads were sutured in place, the pacemaker pocket was irrigated with gentamicin solution. The leads were connected to a dual-chamber rate-responsive pacemaker generator ([employer] Adapta [id number]) and          positioned into the pocket. The pocket was closed with 2-0 and 4-0 Vicryl, respectively. Steri-Strips and pressure dressing were applied.  ____________________________ Isaias Cowman,  MD ap:slb D: 09/09/2011 13:24:09 ET T: 09/09/2011 13:38:35 ET JOB#: [id number]  cc: Isaias Cowman, MD, <Dictator> Isaias Cowman MD ELECTRONICALLY SIGNED 09/12/2011 8:49

## 2014-10-15 NOTE — Op Note (Signed)
PATIENT NAME:  Ann Kaufman, ACKROYD MR#:  [id number] DATE OF BIRTH:  1930-02-01  DATE OF PROCEDURE:  09/17/2011  PRIMARY CARE PHYSICIAN: Margarita Rana, MD  PREPROCEDURE DIAGNOSIS: Sick sinus syndrome.   PROCEDURE: Ventricular lead repositioning.   POSTPROCEDURE DIAGNOSIS: Atrial sensing with ventricular pacing.   INDICATION: The patient is a 82 year old female status post dual-chamber pacemaker 09/09/2011 for sick sinus syndrome and paroxysmal atrial fibrillation. The patient was recently hospitalized with apparent diaphragmatic stimulation and pacemaker interrogation has shown the lead was malfunctioning with inability to pace. The pacemaker was reprogrammed to AAI and now is prepared for ventricular lead repositioning. The procedure, risks, benefits, and alternatives of ventricular lead repositioning were explained to the patient and informed written consent was obtained.   DESCRIPTION OF PROCEDURE: She was brought to the operating room in a fasting state. The left pectoral region was prepped and draped in the usual sterile manner. The pacemaker pocket was reopened and the right ventricular lead was interrogated. The lead was repositioned in the high right ventricular septum. After proper thresholds were obtained, the lead was sutured in place. The lead was reconnected with a rate responsive dual chamber pacemaker generator. The pacemaker pocket was irrigated with gentamicin solution. The pacemaker generator was positioned into the pocket, and the pocket was closed with 2-0 and 4-0 Vicryl, respectively. Steri-Strips and pressure dressing were applied. ____________________________ Isaias Cowman, MD ap:slb D: 09/17/2011 13:44:42 ET     T: 09/17/2011 13:55:08 ET        JOB#: [id number] cc: Isaias Cowman, MD, <Dictator> Isaias Cowman MD ELECTRONICALLY SIGNED 09/18/2011 11:10

## 2014-10-15 NOTE — H&P (Signed)
PATIENT NAME:  Ann Kaufman, Ann Kaufman MR#:  [id number] DATE OF BIRTH:  24-Nov-1929  DATE OF ADMISSION:  09/13/2011  PRIMARY CARE PHYSICIAN: Margarita Rana, MD   CHIEF COMPLAINT: Left-sided chest pain.   HISTORY OF PRESENT ILLNESS: Ann Kaufman is a 81 year old pleasant Caucasian female with history of atrial fibrillation and sick sinus syndrome, underwent pacemaker insertion on March 19th by Dr. Isaias Cowman. The patient was doing well until last evening when she started to have recurrent left-sided chest pains located beneath the lower left ribs area. These come for a second or so and disappear.  They became recurrent, and she had at least 20 episodes. There is no associated shortness of breath, nausea or vomiting. No fever. No chills. No cough. No sputum production. The Emergency Department physician discussed the case with Dr. Clayborn Bigness, and a decision was made to admit the patient for observation. He also recommended not to start the patient on any anticoagulation unless she had deep vein thrombosis in the left arm, and he ordered a Doppler ultrasound for the left arm which came back to be negative for deep vein thrombosis. The patient was admitted for observation and to follow up on her cardiac enzymes. Her initial troponin is mildly elevated at 0.09.    REVIEW OF SYSTEMS: CONSTITUTIONAL: Denies any fever. No chills. No night sweats. No fatigue. EYES: No blurring of vision. No double vision. ENT: No hearing impairment. No sore throat. No dysphagia. CARDIOVASCULAR: Reported the recurrent left lower rib chest pain. No shortness of breath. No edema. No syncope. RESPIRATORY: No cough. No sputum production. No shortness of breath. No hemoptysis. GASTROINTESTINAL: No abdominal pain. No nausea, no vomiting. No diarrhea. She reported nausea two days ago after visiting her cardiologist as postoperative followup. That was a transient issue. GENITOURINARY: Denies any dysuria. No frequency of urination. No vaginal bleed.  MUSCULOSKELETAL: No joint pain or swelling. No muscular pain or swelling. INTEGUMENTARY: No skin rash. No ulcers, but there were some bruises and hematomas at the left forearm and also chest area where she had the pacemaker. NEUROLOGICAL: No focal weakness. No seizure activity. No headache. PSYCHIATRY: No anxiety. No depression. She has only history of depression in the past. ENDOCRINE: No night sweats. No polyuria. No polydipsia. No heat or cold intolerance.     PAST MEDICAL HISTORY:  1. History of atrial fibrillation and sick sinus syndrome, underwent pacemaker insertion on March 19th by Dr. Isaias Cowman.  2. History of psoriasis.   PAST SURGICAL HISTORY:  1. History of total hysterectomy for removal of teratoma.  2. History of breast lumpectomy.  3. History of appendectomy.  4. History of bowel resection for reasons of adhesions.   SOCIAL HABITS: Nonsmoker. No history of alcohol abuse. She drinks socially occasionally. No other drug abuse.   FAMILY HISTORY: Her father had a brain aneurysm, and her mother suffered from hypertension and complications of Alzheimer's dementia.   SOCIAL HISTORY: She is widowed, lives at home alone. She is retired. She used to work in an automobile office doing paperwork.   ADMISSION MEDICATIONS:  1. Aspirin 81 mg a day.  2. Vitamin M54. 3. Folic acid.  4. Hydrochlorothiazide 12.5 mg once a day.  5. Keflex 250 mg every 6 hours. 6. Methotrexate 2.5 mg, 6 tablets once a week. She already had that. 7. Metoprolol 25 mg b.i.d.   8. Nexium 40 mg once a day. 9. Zoloft 50 mg once a day.  10. Vitamin D 50,000 units every 2 weeks.   ALLERGIES:  No known drug allergies.   PHYSICAL EXAMINATION:  VITAL SIGNS: Blood pressure 142/60, respiratory rate 18, pulse 60, temperature 98.2. Oxygen saturation 98%.   GENERAL APPEARANCE: Elderly female lying in bed in no acute distress.  HEENT: Head: Examination revealed no pallor. No icterus. No cyanosis. Ears, nose  and throat: On examination, hearing was normal. Nasal mucosa, lips, tongue were normal. Eyes: Examination revealed normal eyes and conjunctiva. Pupils are about 4 mm, equal. I did not detect reactivity to light. The patient had a previous cataract surgery and lens implant.   NECK: Supple. Trachea at midline. No thyromegaly. No cervical lymphadenopathy. No masses. No carotid bruits.  HEART: Normal sinus rhythm. No S3, S4. No murmur. No gallop.   CHEST: The left upper chest pacemaker site is slightly swollen with mild subcutaneous hematoma. The wound appears clean. No discharge, no erythema.   LUNGS: Examination revealed a normal breathing pattern without use of accessory muscles. No rales. No wheezing.   ABDOMEN: Soft without tenderness. No hepatosplenomegaly. No masses. No hernias.   MUSCULOSKELETAL: No joint swelling. No clubbing.   SKIN: No ulcers. No subcutaneous nodules.   NEUROLOGICAL: Cranial nerves II through XII are intact. No focal motor deficit.   PSYCHIATRY: The patient is alert and oriented x3. Mood and affect were normal.   LABORATORY, DIAGNOSTIC AND RADIOLOGICAL DATA:  Her EKG showed electronic atrial pacing rhythm and nonspecific ST-T wave abnormalities in the lateral leads, otherwise unremarkable EKG. Serum glucose 89, BUN 14, creatinine 0.9, sodium 134, potassium 4.3.  Her liver function tests were normal, slight elevation of AST at 38.  Total CPK 90. CK-MB fraction normal at 1.4. Troponin was mildly elevated at 0.09.  CBC showed white count of 6000, hemoglobin 13, hematocrit 40, platelet count 176. Prothrombin time 13.0. INR 1.0. PTT 32.  Chest x-ray showed heart size is upper normal, no consolidation, no effusion. Pacemaker was seen with the two leads, no abnormalities.  Doppler ultrasound of left arm showed no evidence of deep vein thrombosis.    ASSESSMENT:  1. Recurrent left lower chest pain: This is likely musculoskeletal. I observed the patient in the Emergency  Department. During examination she tried to turn to the left, and that inflicted the pain. I also understood from her that the left arm sling on Velcro was left for a couple of days more than it should be. This may have some role in development of musculoskeletal pain. Nevertheless, due to the elevated troponin, we will continue to monitor for the next 24 hours.  2. Mild elevation of troponin but normal CK-MB fraction and normal total CPK: I do not know the significance of this mild elevation. We will continue to monitor the next level of troponin.  3. Atrial fibrillation and sick sinus syndrome: The patient is status post pacemaker insertion done on March 19th this month.  4. Mild hyponatremia: This is likely related to her hydrochlorothiazide.  5. Psoriasis. 6. History of appendectomy. 7. Breast lumpectomy.  8. History of bowel resection. 9. History of total hysterectomy. 10. History of cataract surgery.   PLAN: Plan is to admit the patient for observation, follow up on her cardiac enzymes. Dr. Clayborn Bigness was consulted for further input. I will continue her home medications as listed above.   TIME NEEDED TO EVALUATE THIS PATIENT: More than 50 minutes.   ____________________________ Clovis Pu. Lenore Manner, MD amd:cbb D: 09/13/2011 00:42:58 ET T: 09/13/2011 10:33:55 ET JOB#: [id number]  cc: Clovis Pu. Lenore Manner, MD, <Dictator> Jerrell Belfast, MD Mike Craze Jerilynn Mages  [place] MD ELECTRONICALLY SIGNED 09/13/2011 22:22

## 2014-10-24 ENCOUNTER — Encounter: Payer: Self-pay | Admitting: *Deleted

## 2014-10-31 ENCOUNTER — Ambulatory Visit (INDEPENDENT_AMBULATORY_CARE_PROVIDER_SITE_OTHER): Payer: Medicare Other | Admitting: Neurology

## 2014-10-31 ENCOUNTER — Encounter: Payer: Self-pay | Admitting: Neurology

## 2014-10-31 VITALS — BP 128/77 | HR 83 | Wt 173.2 lb

## 2014-10-31 DIAGNOSIS — I610 Nontraumatic intracerebral hemorrhage in hemisphere, subcortical: Secondary | ICD-10-CM

## 2014-10-31 NOTE — Patient Instructions (Signed)
I had a long discussion with the patient and her daughter regarding her remote intracerebral hemorrhage and mild residual gait and cognitive difficulties. All things considered she's done quite well. I advised her to get up slowly and avoid certain movements to prevent falling. Continue Keppra for seizures and aspirin for stroke prevention for atrial fibrillation as she is not anticoagulation candidate given history of brain hemorrhage on Xarelto. Strict control of hypertension with blood pressure goal below 130/90. Return for follow-up in 6 months or call earlier if necessary.

## 2014-10-31 NOTE — Progress Notes (Signed)
PATIENT: Ann Kaufman DOB: Feb 01, 1930  REASON FOR VISIT: routine follow up for ICH HISTORY FROM: patient  HISTORY OF PRESENT ILLNESS: Ann Kaufman is an 81 y.o. female with a history of a ICH on 05/18/2013 while on Xarelto for atrial fibrillation. Lives independently @ baseline. Brought by EMS to [facility] ED as code stroke and seizure. Intubated due to AMS with compromised airway and resp distress. CT head revealed Left frontal lobe intraparenchymal hematoma with intraventricular hemorrhage within the left lateral ventricle as above. Hemorrhagic infarct with intraventricular extension is suspected. No midline shift or hydrocephalus identified. Carotid Duplex Findings suggested 1-39% internal carotid artery stenosis bilaterally. She was with her daughter on 07/24/12 and while the two were walking her daughter noted neurological changes. Patient has a mild aphasia at baseline. Acutely though the patient became unable to speak and was having extensive difficulty getting her words out. Was also noted to have right upper extremity weakness as well. Patient remained ambulatory. Was brought to [facility] where a code stroke was called. Daughter reports that the patient's symptoms lasted about an hour and then resolved completely.  Today is her first visit to the office post-hospital. She is feeling well, back to living independently, with no residual deficits other than mild aphasia, which she had at baseline. BP is well controlled, it is 126/76 in office today. She is tolerating daily aspirin well without side effects, and is tolerating Keppra BID without complication. She has had no recurrent seizures. She has had a couple instances with irregularity in her peripheral vision, which resolved within moments. She has an eye evaluation appointment in 2 weeks.   UPDATE 12/12/13 (LL): Since last visit, patient is doing well.  Her eye evaluation was normal after last visit. She is tolerating daily aspirin well without side  effects, and is tolerating Keppra BID without complication. She has had no recurrent seizures.   Blood pressure is well controlled, it is 127/67 in the office today. Update 04/06/2014 : She returns for followup her last visit 4 months ago. She is a complaint by her daughter. She has new complaint of intermittent headaches. She is a poor historian and the daughter has to fill in most of the details. She has been having intermittent headaches for the last 3 months. These occurred a variable frequency but perhaps at least 2 or 3 times per week. The headaches are mostly in the occipital region but bifrontal also occasionally. She describes this as moderate to severe at 9/10 in severity. Mostly aching in nature but occasionally throbbing. There is not associated nausea ,vomiting  But there is slight light and sound sensitivity and she feels better if she turns down the music and the like. She cannot tell me his physical activity has any relationship with the headache. She does have remote history of migraine headaches in the 1970s but she had outgrown them. She denies any recent fall with head injury though she didn't fall and bruised her leg a month ago. She continues to take Keppra and has not had any breakthrough seizures. Her blood pressure is under good control and is 123/6 for an office today she denies any blurred vision, loss of vision, scalp tenderness, jaw claudication or myalgias or arthralgias. Update 07/13/2014 : She returns for follow-up after last visit 3 months ago accompanied by sister and daughter. She states her headaches are significantly better and she rarely has them and takes Tylenol which seems to work. She has not had any further seizures.  She remains on Keppra as she is tolerating well without any side effects. Had a CT scan of the head on 04/11/14 which I have personally reviewed and does not show any acute abnormalities. Remote age encephalomalacia and gliosis from prior intracerebral  hemorrhage is noted in the left frontal region as well as mild changes of small vessel disease and generalized cerebral atrophy. Lab results from 04/06/14 show normal metabolic panel lab , ESR except marginally elevated creatinine of 1.$RemoveBeforeD'19mg'[id number]$ % Update 10/31/2014 : She returns for f/u after last visit 4 months ago. She still has mild left leg weakness especially when she first gets up. Her balance remains poor but she ha snot had any falls.She also has occasionally word finding difficulties and mild memory difficulties. She ha snot had any seizures and remains on keppra which she is tolerating well without any side effects.Her blood pressure is well controlled and is 128/77 today. REVIEW OF SYSTEMS: Full 14 system review of systems performed and notable only for: fatigue,shortness of breath,walking difficulty,easy bruising,bleeding ,walking difficulty,headache,speeech difficulty and all other systems negative    ALLERGIES: Allergies  Allergen Reactions  . Latex     HOME MEDICATIONS: Outpatient Prescriptions Prior to Visit  Medication Sig Dispense Refill  . aspirin EC 81 MG EC tablet Take 1 tablet (81 mg total) by mouth daily.    . famotidine (PEPCID) 20 MG tablet Take 1 tablet (20 mg total) by mouth 2 (two) times daily.    . folic acid (FOLVITE) 277 MCG tablet Take 400 mcg by mouth every evening.    . furosemide (LASIX) 40 MG tablet Take 40 mg by mouth daily.    Marland Kitchen KEPPRA 500 MG tablet TAKE 1 TABLET TWICE A DAY 180 tablet 1  . metoprolol tartrate (LOPRESSOR) 25 MG tablet Take 1 tablet (25 mg total) by mouth 2 (two) times daily. 60 tablet 1  . omeprazole (PRILOSEC) 20 MG capsule Take 1 capsule (20 mg total) by mouth every morning. 30 capsule 1  . PACERONE 200 MG tablet TAKE 1 TABLET EVERY MORNING 90 tablet 0  . potassium chloride SA (K-DUR,KLOR-CON) 20 MEQ tablet Take 1 tablet (20 mEq total) by mouth daily. 30 tablet 3  . senna (SENOKOT) 8.6 MG TABS tablet Take 2 tablets (17.2 mg total) by mouth at  bedtime. 120 each 0  . sertraline (ZOLOFT) 50 MG tablet TAKE 1 TABLET AT BEDTIME 90 tablet 3  . simvastatin (ZOCOR) 10 MG tablet Take 1 tablet (10 mg total) by mouth every evening. 15 tablet 0  . vitamin B-12 (CYANOCOBALAMIN) 1000 MCG tablet Take 1 tablet (1,000 mcg total) by mouth every evening. 30 tablet 1  . Vitamin D, Ergocalciferol, (DRISDOL) 50000 UNITS CAPS capsule Take 1 capsule (50,000 Units total) by mouth every 14 (fourteen) days. 4 capsule 1  . ALPRAZolam (XANAX) 0.25 MG tablet Take 0.25 mg by mouth at bedtime as needed for anxiety.     No facility-administered medications prior to visit.     PHYSICAL EXAM  Filed Vitals:   10/31/14 1503  BP: 128/77  Pulse: 83  Weight: 173 lb 3.2 oz (78.563 kg)   Body mass index is 29.72 kg/(m^2). No exam data present  Generalized: Well developed, in no acute distress  Head: normocephalic and atraumatic.    Neck: Supple, no carotid bruits  Cardiac: Regular rate rhythm, no murmur  Musculoskeletal: No deformity   Neurological examination  Mentation: Alert oriented to time, place, history taking. Follows all commands speech and language with mild expressive aphasia  and word-finding difficulties.  Cranial nerve II-XII: Pupils were equal round reactive to light extraocular movements were full, visual field were full on confrontational test. Facial sensation and strength were normal. hearing was intact to finger rubbing bilaterally. Uvula tongue midline. head turning and shoulder shrug and were normal and symmetric.Tongue protrusion into cheek strength was normal.  Motor: The motor testing reveals 5 over 5 strength of all 4 extremities. Good symmetric motor tone is noted throughout. Diminished fine finger movements on right and orbits left over right upper extremity Sensory: Sensory testing is intact to pinprick, soft touch, vibration sensation, and position sense on all 4 extremities. No evidence of extinction is noted.  Coordination:  Cerebellar testing reveals good finger-nose-finger and heel-to-shin bilaterally.  Gait and station: Gait is normal. Tandem gait is  Slightly abnormal. Romberg is negative. No drift is seen.  Reflexes: Deep tendon reflexes are symmetric and normal bilaterally. Toes are downgoing bilaterally.   ASSESSMENT AND PLAN Ms. SHANEQUA WHITENIGHT is a 81 y.o. female presented with a left frontal intracranial hemorrhage on 05/18/13. The patient was on Xarelto prior to admission. Seizure at the time of presentation. She later presented with aphasia and RUE hemiparesis that resolved on 07/24/12.   PLAN:   I had a long discussion with the patient and her daughter regarding her remote intracerebral hemorrhage and mild residual gait and cognitive difficulties. All things considered she's done quite well. I advised her to get up slowly and avoid certain movements to prevent falling. Continue Keppra for seizures and aspirin for stroke prevention for atrial fibrillation as she is not anticoagulation candidate given history of brain hemorrhage on Xarelto. Strict control of hypertension with blood pressure goal below 130/90. Return for follow-up in 6 months or call earlier if necessary.   Antony Contras, MD  10/31/2014, 9:38 PM [facility] [address] [place], [place] [address] [phone]  Note: This document was prepared with digital dictation and possible smart phrase technology. Any transcriptional errors that result from this process are unintentional.

## 2014-11-01 ENCOUNTER — Telehealth: Payer: Self-pay | Admitting: *Deleted

## 2014-11-01 NOTE — Telephone Encounter (Signed)
Spoke with patient's daughter and completed application re: [facility] transportation for Ms Zagal. Completed forms successfully faxed to [employer] then sent to medical records.

## 2014-11-24 ENCOUNTER — Encounter: Payer: Self-pay | Admitting: *Deleted

## 2014-12-02 ENCOUNTER — Inpatient Hospital Stay (HOSPITAL_COMMUNITY): Payer: Medicare Other

## 2014-12-02 ENCOUNTER — Telehealth: Payer: Self-pay | Admitting: Cardiology

## 2014-12-02 ENCOUNTER — Emergency Department (HOSPITAL_COMMUNITY): Payer: Medicare Other

## 2014-12-02 ENCOUNTER — Inpatient Hospital Stay (HOSPITAL_COMMUNITY)
Admission: EM | Admit: 2014-12-02 | Discharge: 2014-12-04 | DRG: 069 | Disposition: A | Discharge status: Home / Self Care | Payer: Medicare Other | Attending: Internal Medicine | Admitting: Internal Medicine

## 2014-12-02 ENCOUNTER — Encounter (HOSPITAL_COMMUNITY): Payer: Self-pay | Admitting: Emergency Medicine

## 2014-12-02 DIAGNOSIS — I272 Other secondary pulmonary hypertension: Secondary | ICD-10-CM | POA: Diagnosis present

## 2014-12-02 DIAGNOSIS — I639 Cerebral infarction, unspecified: Secondary | ICD-10-CM | POA: Insufficient documentation

## 2014-12-02 DIAGNOSIS — Z7982 Long term (current) use of aspirin: Secondary | ICD-10-CM | POA: Diagnosis not present

## 2014-12-02 DIAGNOSIS — Z8673 Personal history of transient ischemic attack (TIA), and cerebral infarction without residual deficits: Secondary | ICD-10-CM | POA: Diagnosis not present

## 2014-12-02 DIAGNOSIS — G459 Transient cerebral ischemic attack, unspecified: Principal | ICD-10-CM | POA: Diagnosis present

## 2014-12-02 DIAGNOSIS — Z87891 Personal history of nicotine dependence: Secondary | ICD-10-CM

## 2014-12-02 DIAGNOSIS — M069 Rheumatoid arthritis, unspecified: Secondary | ICD-10-CM | POA: Diagnosis present

## 2014-12-02 DIAGNOSIS — R4701 Aphasia: Secondary | ICD-10-CM | POA: Diagnosis present

## 2014-12-02 DIAGNOSIS — I482 Chronic atrial fibrillation, unspecified: Secondary | ICD-10-CM | POA: Diagnosis present

## 2014-12-02 DIAGNOSIS — Z950 Presence of cardiac pacemaker: Secondary | ICD-10-CM | POA: Diagnosis not present

## 2014-12-02 DIAGNOSIS — M6289 Other specified disorders of muscle: Secondary | ICD-10-CM

## 2014-12-02 DIAGNOSIS — F329 Major depressive disorder, single episode, unspecified: Secondary | ICD-10-CM | POA: Diagnosis present

## 2014-12-02 DIAGNOSIS — E785 Hyperlipidemia, unspecified: Secondary | ICD-10-CM | POA: Diagnosis present

## 2014-12-02 DIAGNOSIS — R0609 Other forms of dyspnea: Secondary | ICD-10-CM | POA: Diagnosis not present

## 2014-12-02 DIAGNOSIS — R531 Weakness: Secondary | ICD-10-CM | POA: Diagnosis present

## 2014-12-02 DIAGNOSIS — I5032 Chronic diastolic (congestive) heart failure: Secondary | ICD-10-CM | POA: Diagnosis not present

## 2014-12-02 DIAGNOSIS — M1990 Unspecified osteoarthritis, unspecified site: Secondary | ICD-10-CM | POA: Diagnosis present

## 2014-12-02 DIAGNOSIS — I10 Essential (primary) hypertension: Secondary | ICD-10-CM | POA: Diagnosis present

## 2014-12-02 DIAGNOSIS — G40909 Epilepsy, unspecified, not intractable, without status epilepticus: Secondary | ICD-10-CM

## 2014-12-02 DIAGNOSIS — I4891 Unspecified atrial fibrillation: Secondary | ICD-10-CM | POA: Diagnosis not present

## 2014-12-02 DIAGNOSIS — Z79899 Other long term (current) drug therapy: Secondary | ICD-10-CM

## 2014-12-02 DIAGNOSIS — I341 Nonrheumatic mitral (valve) prolapse: Secondary | ICD-10-CM | POA: Diagnosis present

## 2014-12-02 DIAGNOSIS — R0600 Dyspnea, unspecified: Secondary | ICD-10-CM | POA: Diagnosis not present

## 2014-12-02 DIAGNOSIS — R0602 Shortness of breath: Secondary | ICD-10-CM | POA: Diagnosis not present

## 2014-12-02 DIAGNOSIS — R29810 Facial weakness: Secondary | ICD-10-CM | POA: Diagnosis present

## 2014-12-02 DIAGNOSIS — I619 Nontraumatic intracerebral hemorrhage, unspecified: Secondary | ICD-10-CM | POA: Diagnosis present

## 2014-12-02 DIAGNOSIS — G9389 Other specified disorders of brain: Secondary | ICD-10-CM | POA: Diagnosis not present

## 2014-12-02 DIAGNOSIS — R55 Syncope and collapse: Secondary | ICD-10-CM | POA: Diagnosis not present

## 2014-12-02 LAB — DIFFERENTIAL
BASOS ABS: 0 10*3/uL (ref 0.0–0.1)
BASOS PCT: 1 % (ref 0–1)
Eosinophils Absolute: 0.2 10*3/uL (ref 0.0–0.7)
Eosinophils Relative: 3 % (ref 0–5)
Lymphocytes Relative: 18 % (ref 12–46)
Lymphs Abs: 1 10*3/uL (ref 0.7–4.0)
MONO ABS: 0.8 10*3/uL (ref 0.1–1.0)
Monocytes Relative: 14 % — ABNORMAL HIGH (ref 3–12)
NEUTROS ABS: 3.8 10*3/uL (ref 1.7–7.7)
Neutrophils Relative %: 64 % (ref 43–77)

## 2014-12-02 LAB — CBC
HEMATOCRIT: 38.5 % (ref 36.0–46.0)
HEMOGLOBIN: 12.4 g/dL (ref 12.0–15.0)
MCH: 27.9 pg (ref 26.0–34.0)
MCHC: 32.2 g/dL (ref 30.0–36.0)
MCV: 86.5 fL (ref 78.0–100.0)
Platelets: 144 10*3/uL — ABNORMAL LOW (ref 150–400)
RBC: 4.45 MIL/uL (ref 3.87–5.11)
RDW: 14.7 % (ref 11.5–15.5)
WBC: 5.8 10*3/uL (ref 4.0–10.5)

## 2014-12-02 LAB — COMPREHENSIVE METABOLIC PANEL
ALK PHOS: 69 U/L (ref 38–126)
ALT: 16 U/L (ref 14–54)
ANION GAP: 9 (ref 5–15)
AST: 25 U/L (ref 15–41)
Albumin: 3.5 g/dL (ref 3.5–5.0)
BUN: 18 mg/dL (ref 6–20)
CHLORIDE: 105 mmol/L (ref 101–111)
CO2: 24 mmol/L (ref 22–32)
CREATININE: 1.16 mg/dL — AB (ref 0.44–1.00)
Calcium: 9.1 mg/dL (ref 8.9–10.3)
GFR calc Af Amer: 49 mL/min — ABNORMAL LOW (ref >60)
GFR, EST NON AFRICAN AMERICAN: 43 mL/min — AB (ref >60)
Glucose, Bld: 94 mg/dL (ref 65–99)
Potassium: 3.8 mmol/L (ref 3.5–5.1)
SODIUM: 138 mmol/L (ref 135–145)
TOTAL PROTEIN: 6.6 g/dL (ref 6.5–8.1)
Total Bilirubin: 0.5 mg/dL (ref 0.3–1.2)

## 2014-12-02 LAB — I-STAT CHEM 8, ED
BUN: 22 mg/dL — AB (ref 6–20)
CALCIUM ION: 1.17 mmol/L (ref 1.13–1.30)
CREATININE: 1.1 mg/dL — AB (ref 0.44–1.00)
Chloride: 106 mmol/L (ref 101–111)
Glucose, Bld: 94 mg/dL (ref 65–99)
HEMATOCRIT: 41 % (ref 36.0–46.0)
Hemoglobin: 13.9 g/dL (ref 12.0–15.0)
POTASSIUM: 3.8 mmol/L (ref 3.5–5.1)
SODIUM: 140 mmol/L (ref 135–145)
TCO2: 20 mmol/L (ref 0–100)

## 2014-12-02 LAB — PROTIME-INR
INR: 1.16 (ref 0.00–1.49)
PROTHROMBIN TIME: 14.9 s (ref 11.6–15.2)

## 2014-12-02 LAB — I-STAT TROPONIN, ED: Troponin i, poc: 0 ng/mL (ref 0.00–0.08)

## 2014-12-02 LAB — CBG MONITORING, ED: Glucose-Capillary: 120 mg/dL — ABNORMAL HIGH (ref 65–99)

## 2014-12-02 LAB — APTT: aPTT: 26 seconds (ref 24–37)

## 2014-12-02 MED ORDER — FUROSEMIDE 40 MG PO TABS
40.0000 mg | ORAL_TABLET | Freq: Every day | ORAL | Status: DC
  Administered 2014-12-03 – 2014-12-04 (×2): 40 mg via ORAL
  Filled 2014-12-02 (×2): qty 1

## 2014-12-02 MED ORDER — STROKE: EARLY STAGES OF RECOVERY BOOK
Freq: Once | Status: AC
  Administered 2014-12-02: 1

## 2014-12-02 MED ORDER — VITAMIN B-12 1000 MCG PO TABS
1000.0000 ug | ORAL_TABLET | Freq: Every evening | ORAL | Status: DC
  Administered 2014-12-02 – 2014-12-03 (×2): 1000 ug via ORAL
  Filled 2014-12-02 (×2): qty 1

## 2014-12-02 MED ORDER — ASPIRIN EC 81 MG PO TBEC
81.0000 mg | DELAYED_RELEASE_TABLET | Freq: Every day | ORAL | Status: DC
  Administered 2014-12-03 – 2014-12-04 (×2): 81 mg via ORAL
  Filled 2014-12-02 (×3): qty 1

## 2014-12-02 MED ORDER — METOPROLOL TARTRATE 25 MG PO TABS
25.0000 mg | ORAL_TABLET | Freq: Two times a day (BID) | ORAL | Status: DC
  Administered 2014-12-03 – 2014-12-04 (×3): 25 mg via ORAL
  Filled 2014-12-02 (×3): qty 1

## 2014-12-02 MED ORDER — SENNA 8.6 MG PO TABS
2.0000 | ORAL_TABLET | Freq: Every day | ORAL | Status: DC
  Administered 2014-12-02 – 2014-12-03 (×2): 17.2 mg via ORAL
  Filled 2014-12-02 (×2): qty 2

## 2014-12-02 MED ORDER — SIMVASTATIN 5 MG PO TABS
10.0000 mg | ORAL_TABLET | Freq: Every evening | ORAL | Status: DC
  Administered 2014-12-02: 10 mg via ORAL
  Filled 2014-12-02: qty 2

## 2014-12-02 MED ORDER — SERTRALINE HCL 50 MG PO TABS
50.0000 mg | ORAL_TABLET | Freq: Every day | ORAL | Status: DC
  Administered 2014-12-02 – 2014-12-03 (×2): 50 mg via ORAL
  Filled 2014-12-02 (×2): qty 1

## 2014-12-02 MED ORDER — VITAMIN D (ERGOCALCIFEROL) 1.25 MG (50000 UNIT) PO CAPS: 50000.0000 [IU] | ORAL_CAPSULE | ORAL | Status: DC

## 2014-12-02 MED ORDER — PANTOPRAZOLE SODIUM 40 MG PO TBEC
40.0000 mg | DELAYED_RELEASE_TABLET | Freq: Every day | ORAL | Status: DC
  Administered 2014-12-03 – 2014-12-04 (×2): 40 mg via ORAL
  Filled 2014-12-02 (×2): qty 1

## 2014-12-02 MED ORDER — LEVETIRACETAM 500 MG PO TABS
500.0000 mg | ORAL_TABLET | Freq: Two times a day (BID) | ORAL | Status: DC
  Administered 2014-12-02 – 2014-12-04 (×4): 500 mg via ORAL
  Filled 2014-12-02 (×4): qty 1

## 2014-12-02 MED ORDER — AMIODARONE HCL 200 MG PO TABS
200.0000 mg | ORAL_TABLET | Freq: Every morning | ORAL | Status: DC
  Administered 2014-12-03 – 2014-12-04 (×2): 200 mg via ORAL
  Filled 2014-12-02 (×2): qty 1

## 2014-12-02 MED ORDER — SENNOSIDES-DOCUSATE SODIUM 8.6-50 MG PO TABS: 1.0000 | ORAL_TABLET | Freq: Every evening | ORAL | Status: DC | PRN

## 2014-12-02 MED ORDER — FAMOTIDINE 20 MG PO TABS
20.0000 mg | ORAL_TABLET | ORAL | Status: DC
  Administered 2014-12-02 – 2014-12-04 (×2): 20 mg via ORAL
  Filled 2014-12-02 (×3): qty 1

## 2014-12-02 MED ORDER — POTASSIUM CHLORIDE CRYS ER 20 MEQ PO TBCR
20.0000 meq | EXTENDED_RELEASE_TABLET | Freq: Every day | ORAL | Status: DC
  Administered 2014-12-03 – 2014-12-04 (×2): 20 meq via ORAL
  Filled 2014-12-02 (×2): qty 1

## 2014-12-02 MED ORDER — FOLIC ACID 0.5 MG HALF TAB
500.0000 ug | ORAL_TABLET | Freq: Every evening | ORAL | Status: DC
  Administered 2014-12-02 – 2014-12-03 (×2): 0.5 mg via ORAL
  Filled 2014-12-02 (×3): qty 1

## 2014-12-02 NOTE — H&P (Addendum)
Patient was seen, examined, treatment plan was discussed with the Physician extender. I have directly reviewed the clinical findings, lab, imaging studies and management of this patient in detail. I have made the necessary changes to the above noted documentation, and agree with the documentation, as recorded by the Physician extender.  Brief narrative: 81 year old female with past medical history of ICH in 04/2013 while on xarelto for atrial fibrillation, currently only on aspirin for stroke prevention, history of seizures. She presented to [facility] ED with reports of shortness of breath but then while she was in the triage she developed slurred speech, right facial droop and right side weakness. A code stroke was called and NIHSS was 1 per stroke team evaluation. Vitals signs were stable in ED. Blood work was notable for mild elevation in creatinine, 1.16 otherwise unremarkable. CXR showed no acute cardiopulmonary process. CT head showed no acute intracranial findings.  Assessment & Plan  Principal Problem: Acute right-sided weakness /TIA  Stroke work up initiated: - Continue aspirin daily - CT head showed no acute findings  - MRI not done, she has pacemaker - 2 D ECHO is pending  - Carotid doppler ordered, pending  - Check A1c, lipid panel. LDL ; LDL goal < 100. Patient on simvastatin 10 mg daily - Therapy: PT/OT - Other Stroke Risk Factors  Advanced age, Hx of ICH, HTN, atrial fibrillation   Active Problems: Essential HTN (hypertension) - Continue metoprolol and lasix  Atrial fibrillation, chronic - CHADS vasc score of 7 - Continue amiodarone - Not on coumadin due to [place] - Only on aspirin for secondary stroke prevention   Chronic diastolic heart failure, NYHA class 1 - Last 2 D ECHO in 2014 revealed grade 1 diastolic dysfunction - CXR showed no acute findings, specifically no evidence of acute CHF - Her respiratory status is table at this time - Continue lasix, metoprolol, aspirin    Pacemaker - Stable   Seizure disorder after stroke - We will continue Keppra  Depression - Continue Zoloft    Ann Kaufman [facility] [phone]  *Please  refer to admission note done below by physician extender for further details.       [facility] History and Physical                                                                                    Ann Kaufman, is a 81 y.o. female  MRN: [id number]   DOB - 1929-10-17  Admit Date - 12/02/2014  Outpatient Primary MD for the patient is Velna Hatchet, MD  Referring MD: Doy Mince / ER  Consulting MD: Doy Mince / ER  With History of -  Past Medical History  Diagnosis Date  . Intracerebral hemorrhage     a. 04/2013 in setting of xarelto therapy.  . Atrial fibrillation 2012    a. Dx in 2012;  b. Rhythm controlled - amiodarone, previously anticoagulated with xarelto (d/c'd 04/2013 in setting of [place]);  b. 04/2013 Echo: EF 55-60%, Gr 1 DD, mild MR, mod dil LA, PAsP 29mmhg  . Hypertension   . Depression   . Arthritis   . Presence of permanent cardiac pacemaker  a. 2012 MDT, placed in [place] (probable tachy-brady).  . Pacemaker   . SSS (sick sinus syndrome)     s/p dual chamber pacemaker  . PAF (paroxysmal atrial fibrillation)   . Aphasia     Occasional aphasia & facial numbness from a left frontal lobe intercerebral hemorrhage on Xarelto on 05/18/13.  Marland Kitchen Pericardial effusion   . Mitral valve prolapse   . Hyperlipidemia   . Rheumatoid arthritis   . SOB (shortness of breath)     Occasional SOB  . Palpitations     Occasional.  . Peripheral edema     Because she has been standing on her feet more than usual   . Headache       Past Surgical History  Procedure Laterality Date  . Pacemaker insertion      MDT implanted at [facility]  . Abdominal hysterectomy    . Breast lumpectomy    . Appendectomy    . Bowel resection    . Lead revision  09/17/11    s/p atrial lead revision    in for   Chief Complaint   Patient presents with  . Shortness of Breath  . Code Stroke     HPI This is a 81 year old female patient with past medical history of hemorrhagic CVA in November 2014 with known history of A. fib and prior to that CVA was on Xarelto. She was readmitted in October 2015 for TIA. She has a permanent pacemaker for sick sinus syndrome, per echo 2014 she has grade 1 diastolic dysfunction as well as mild to moderate pulmonary hypertension of 41 mmHg. She also had issues with seizures after the initial hemorrhagic stroke and remains on Keppra. She endorses a history of chronic dyspnea on exertion and actually presented to the hospital today because of worsening dyspnea symptoms. While sitting in triage she developed left-sided weakness and slurred speech. The symptoms quickly resolved. A code stroke was called and patient was evaluated by the neurology team. CT of the head was negative. Because of the pacemaker she has been unable to proceed with MRI. Her respiratory symptoms are unchanged but are stable at rest. She's not had any fever chills or cough. She also notices issues with dizziness and lightheadedness that has worsened over the past several weeks. She feels like she's become more "puffy" around her abdomen and her weight vacillates as much as 3-5 pounds in a 24-48 hour period. She's had no changes in her medications including no increases or decreases in her Lasix. Recently she attempted a telephonic pacemaker check and this was unsuccessful and she had plans to see her cardiologist later this week for an in office pacemaker check.  In the ER she was afebrile, vital signs were stable and nonhypertensive and her sats were 95% on room air. Laboratory data was unremarkable except BUN slightly elevated at 22 with creatinine at baseline of 1.10. Troponin was negative at 0.00. Hemoglobin stable at 13.9 with white count 5800, platelets were slightly decreased at 144,000 but this is not unusual for this patient-  she typically can run platelet counts between 140,000 and 160,000. She is on a baby aspirin daily for her atrial fibrillation and stroke history. Patient reports that she leads a primarily sedentary lifestyle and this seems to have worsened because of the chronic dyspnea on exertion. She also has chronic left lower extremity weakness that began prior to the initial stroke. She has not had any apparent seizures in the past 6-12 months.   Review  of Systems   In addition to the HPI above,  No Fever-chills, myalgias or other constitutional symptoms No Headache, changes with Vision or hearing, new weakness, tingling, numbness in any extremity, No problems swallowing food or Liquids, indigestion/reflux No Chest pain, Cough, palpitations, orthopnea No Abdominal pain, N/V; no melena or hematochezia, no dark tarry stools, Bowel movements are regular, No dysuria, hematuria or flank pain No new skin rashes, lesions, masses or bruises, No new joints pains-aches No polyuria, polydypsia or polyphagia,  *A full 10 point Review of Systems was done, except as stated above, all other Review of Systems were negative.  Social History History  Substance Use Topics  . Smoking status: Former Smoker    Quit date: 11/26/1968  . Smokeless tobacco: Not on file  . Alcohol Use: Yes    Resides at: Private residence  Lives with: Daughter  Ambulatory status: Without assistive devices although occasionally will utilize a cane when she becomes very short of breath   Family History Family History  Problem Relation Age of Onset  . Other      negative for premature CAD.  Marland Kitchen Aneurysm Father      Prior to Admission medications   Medication Sig Start Date End Date Taking? Authorizing Provider  aspirin EC 81 MG EC tablet Take 1 tablet (81 mg total) by mouth daily. 06/08/13  Yes Ivan Anchors Love, PA-C  famotidine (PEPCID) 20 MG tablet Take 20 mg by mouth every other day.  06/08/13  Yes Ivan Anchors Love, PA-C  folic  acid (FOLVITE) 270 MCG tablet Take 400 mcg by mouth every evening.   Yes Historical Provider, MD  furosemide (LASIX) 40 MG tablet Take 40 mg by mouth daily.   Yes Historical Provider, MD  KEPPRA 500 MG tablet TAKE 1 TABLET TWICE A DAY 06/25/14  Yes Garvin Fila, MD  metoprolol tartrate (LOPRESSOR) 25 MG tablet Take 1 tablet (25 mg total) by mouth 2 (two) times daily. 06/08/13  Yes Ivan Anchors Love, PA-C  omeprazole (PRILOSEC) 20 MG capsule Take 20 mg by mouth as needed (for heartburn).  06/08/13  Yes Ivan Anchors Love, PA-C  PACERONE 200 MG tablet TAKE 1 TABLET EVERY MORNING 10/02/14  Yes Thompson Grayer, MD  potassium chloride SA (K-DUR,KLOR-CON) 20 MEQ tablet Take 1 tablet (20 mEq total) by mouth daily. 03/20/14  Yes Meredith Staggers, MD  senna (SENOKOT) 8.6 MG TABS tablet Take 2 tablets (17.2 mg total) by mouth at bedtime. 06/08/13  Yes Ivan Anchors Love, PA-C  sertraline (ZOLOFT) 50 MG tablet TAKE 1 TABLET AT BEDTIME 07/21/14  Yes Garvin Fila, MD  simvastatin (ZOCOR) 10 MG tablet Take 1 tablet (10 mg total) by mouth every evening. 08/25/13  Yes Philmore Pali, NP    Allergies  Allergen Reactions  . Latex Other (See Comments)    unknown    Physical Exam  Vitals  Blood pressure 135/76, pulse 59, temperature 98 F (36.7 C), temperature source Oral, resp. rate 15, height 5\' 4"  (1.626 m), weight 173 lb 4.5 oz (78.6 kg), SpO2 100 %.   General:  In no acute distress, appears healthy and well nourished  Psych:  Normal affect, Denies Suicidal or Homicidal ideations, Awake Alert, Oriented X 3. Speech and thought patterns are clear and appropriate, no apparent short term memory deficits.  ENT:  Ears and Eyes appear Normal, Conjunctivae clear, PER. Moist oral mucosa without erythema or exudates.  Neck:  Supple, No lymphadenopathy appreciated  Respiratory:  Symmetrical chest wall  movement, Good air movement bilaterally, right basilar crackles on posterior exam, Room Air  Cardiac:  RRR, No Murmurs, no LE edema  noted, no JVD, No carotid bruits, peripheral pulses palpable at 2+  Abdomen:  Positive bowel sounds, Soft, Non tender, slightly distended,  No masses appreciated, no obvious hepatosplenomegaly  Skin:  No Cyanosis, Normal Skin Turgor, No Skin Rash or Bruise.  Extremities: Symmetrical without obvious trauma or injury,  no effusions.  Neuro:  General: Mental Status: Alert, oriented, thought content appropriate. Speech fluent without evidence of aphasia. Able to follow 3 step commands without difficulty. Cranial Nerves: II: Discs flat bilaterally; Visual fields grossly normal, pupils equal, round, reactive to light and accommodation III,IV, VI: ptosis not present, extra-ocular motions intact bilaterally V,VII: smile symmetric, facial light touch sensation normal bilaterally VIII: hearing normal bilaterally IX,X: uvula rises symmetrically XI: bilateral shoulder shrug XII: midline tongue extension without atrophy or fasciculations  Motor: Right :Upper extremity 5/5Left: Upper extremity 5/5 Lower extremity 5/5Lower extremity 5/5 Tone and bulk:normal tone throughout; no atrophy noted Sensory: Pinprick and light touch intact throughout, bilaterally Deep Tendon Reflexes:  Right: Upper Extremity Left: Upper extremity   biceps (C-5 to C-6) 2/4 biceps (C-5 to C-6) 2/4 tricep (C7) 2/4triceps (C7) 2/4 Brachioradialis (C6) 2/4Brachioradialis (C6) 2/4  Lower Extremity Lower Extremity  quadriceps (L-2 to L-4) 2/4 quadriceps (L-2 to L-4) 2/4 Achilles (S1) 2/4Achilles (S1) 2/4  Plantars: Right: downgoingLeft: downgoing Cerebellar: normal finger-to-nose, normal heel-to-shin test    Data  Review  CBC  Recent Labs Lab 12/02/14 1453 12/02/14 1502  WBC 5.8  --   HGB 12.4 13.9  HCT 38.5 41.0  PLT 144*  --   MCV 86.5  --   MCH 27.9  --   MCHC 32.2  --   RDW 14.7  --   LYMPHSABS 1.0  --   MONOABS 0.8  --   EOSABS 0.2  --   BASOSABS 0.0  --     Chemistries   Recent Labs Lab 12/02/14 1453 12/02/14 1502  NA 138 140  K 3.8 3.8  CL 105 106  CO2 24  --   GLUCOSE 94 94  BUN 18 22*  CREATININE 1.16* 1.10*  CALCIUM 9.1  --   AST 25  --   ALT 16  --   ALKPHOS 69  --   BILITOT 0.5  --     estimated creatinine clearance is 40 mL/min (by C-G formula based on Cr of 1.1).  No results for input(s): TSH, T4TOTAL, T3FREE, THYROIDAB in the last 72 hours.  Invalid input(s): FREET3  Coagulation profile  Recent Labs Lab 12/02/14 1453  INR 1.16    No results for input(s): DDIMER in the last 72 hours.  Cardiac Enzymes No results for input(s): CKMB, TROPONINI, MYOGLOBIN in the last 168 hours.  Invalid input(s): CK  Invalid input(s): POCBNP  Urinalysis    Component Value Date/Time   COLORURINE YELLOW 07/24/2013 2017   APPEARANCEUR CLOUDY* 07/24/2013 2017   LABSPEC 1.023 07/24/2013 2017   PHURINE 6.0 07/24/2013 2017   GLUCOSEU NEGATIVE 07/24/2013 2017   HGBUR SMALL* 07/24/2013 2017   BILIRUBINUR NEGATIVE 07/24/2013 2017   KETONESUR NEGATIVE 07/24/2013 2017   PROTEINUR NEGATIVE 07/24/2013 2017   UROBILINOGEN 1.0 07/24/2013 2017   NITRITE POSITIVE* 07/24/2013 2017   LEUKOCYTESUR MODERATE* 07/24/2013 2017    Imaging results:   Ct Head (brain) Wo Contrast 12/02/2014   1. No acute intracranial abnormalities. 2. Chronic ischemic changes, as above, most notable for  old left frontal infarction. These results were called by telephone at the time of interpretation on 12/02/2014 at 3:08 pm to Dr. Doy Mince, who verbally acknowledged these results.   Electronically Signed   By: Vinnie Langton M.D.   On: 12/02/2014 15:09     EKG: (Independently reviewed) 100%  atrial pacing, left axis deviation criteria consistent with possible previous septal lateral infarct but unchanged from previous EKG from 2015, QTC 477 ms   Assessment & Plan  Principal Problem:   Acute right-sided weakness/TIA/ History of ICH on Xarelto -Admit to telemetry -Appreciate neurology assistance -Symptoms had resolved by the time I examined the patient -Check echocardiogram and carotid duplex -Check hemoglobin A1c and lipid panel -PT/OT/SLP evaluations -Continue low-dose home aspirin and setting of history of prior hemorrhagic CVA  Active Problems:   Pulmonary HTN/DOE (dyspnea on exertion) -Chronic but progressive issues regarding the dyspnea -Has known pulmonary hypertension of 41 mmHg in 2014; repeat echo to see if has progressed -Suspect a degree of sedentary lifestyle leading to deconditioning and  suspected hypoxemia with ambulation therefore have requested ambulatory pulse oximetry reading during this admission -See below regarding history of chronic diastolic heart failure -Was not on nitrates or afterload reduction agents prior to admission    HTN (hypertension) -Continue Lopressor    Atrial fibrillation, chronic -History of sick sinus syndrome status post pacemaker -currently 100% atrial paced -Continue beta blocker and amiodarone -No anticoagulation secondary to history of Xarelto related intracranial hemorrhage -CHADVASc = 7    Chronic diastolic heart failure, NYHA class 1 -Last echo in 2014 revealed grade 1 diastolic dysfunction -Patient reporting progressive issues of dyspnea on exertion and sensation of abdominal fullness as well as reports of rapid weight gain (at times as much as 3-5 pounds in a 24-48 hours). -Continue preadmission PO Lasix since not hypoxemic -Check two-view chest x-ray-if demonstrates acute heart failure may need to change Lasix to IV and increase frequency or dosage    Pacemaker -Patient scheduled for outpatient visit regarding  pacemaker check    Seizure disorder after stroke -Continue Keppra    DVT Prophylaxis: SCDs  Family Communication:   Daughter at bedside  Code Status:  Full code  Condition:  Stable  Discharge disposition: Anticipate will discharge back home with daughter pending TIA and dyspnea workup  Time spent in minutes : 60      ELLIS,ALLISON L. ANP on 12/02/2014 at 4:51 PM  Between 7am to 7pm - Pager - [phone]  After 7pm go to [email or web address] - password [email or web address]  And look for the night coverage person covering me after hours  [facility]

## 2014-12-02 NOTE — Telephone Encounter (Signed)
Dtr called, mother is SOB- going to [facility] ED.  Kerin Ransom PA-C 12/02/2014 1:01 PM

## 2014-12-02 NOTE — ED Notes (Signed)
Pt to room from CT, neuro MD Reynolds at bedside assessing pt.

## 2014-12-02 NOTE — Consult Note (Signed)
Referring Physician: Doy Mince    Chief Complaint: Right sided weakness and slurred speech  HPI: Ann Kaufman is an 81 y.o. female who presented to the ED today due to shortness of breath.  Was in triage getting blood drawn when she slumped over to the right, developed right facial droop and her speech became slurred.  Patient then became unresponsive.  Code stroke was called at that time.  When she came around her speech continued to be slurred.  By the time of evaluation after CT her symptoms were improving.  Initial NIHSS of 1.    Date last known well: Date: 12/02/2014 Time last known well: Time: 14:29 tPA Given: No: Resolution symptoms  Past Medical History  Diagnosis Date  . Intracerebral hemorrhage     a. 04/2013 in setting of xarelto therapy.  . Atrial fibrillation 2012    a. Dx in 2012;  b. Rhythm controlled - amiodarone, previously anticoagulated with xarelto (d/c'd 04/2013 in setting of [place]);  b. 04/2013 Echo: EF 55-60%, Gr 1 DD, mild MR, mod dil LA, PAsP 27mmhg  . Hypertension   . Depression   . Arthritis   . Presence of permanent cardiac pacemaker     a. 2012 MDT, placed in [place] (probable tachy-brady).  . Pacemaker   . SSS (sick sinus syndrome)     s/p dual chamber pacemaker  . PAF (paroxysmal atrial fibrillation)   . Aphasia     Occasional aphasia & facial numbness from a left frontal lobe intercerebral hemorrhage on Xarelto on 05/18/13.  Marland Kitchen Pericardial effusion   . Mitral valve prolapse   . Hyperlipidemia   . Rheumatoid arthritis   . SOB (shortness of breath)     Occasional SOB  . Palpitations     Occasional.  . Peripheral edema     Because she has been standing on her feet more than usual   . Headache     Past Surgical History  Procedure Laterality Date  . Pacemaker insertion      MDT implanted at [facility]  . Abdominal hysterectomy    . Breast lumpectomy    . Appendectomy    . Bowel resection    . Lead revision  09/17/11    s/p atrial lead revision     Family History  Problem Relation Age of Onset  . Other      negative for premature CAD.  Marland Kitchen Aneurysm Father    Social History:  reports that she quit smoking about 46 years ago. She does not have any smokeless tobacco history on file. She reports that she drinks alcohol. She reports that she does not use illicit drugs.  Allergies:  Allergies  Allergen Reactions  . Latex Other (See Comments)    unknown    Medications: I have reviewed the patient's current medications. Prior to Admission:  Current outpatient prescriptions:  .  aspirin EC 81 MG EC tablet, Take 1 tablet (81 mg total) by mouth daily., Disp: , Rfl:  .  famotidine (PEPCID) 20 MG tablet, Take 1 tablet (20 mg total) by mouth 2 (two) times daily., Disp: , Rfl:  .  folic acid (FOLVITE) 235 MCG tablet, Take 400 mcg by mouth every evening., Disp: , Rfl:  .  furosemide (LASIX) 40 MG tablet, Take 40 mg by mouth daily., Disp: , Rfl:  .  KEPPRA 500 MG tablet, TAKE 1 TABLET TWICE A DAY, Disp: 180 tablet, Rfl: 1 .  metoprolol tartrate (LOPRESSOR) 25 MG tablet, Take 1 tablet (25  mg total) by mouth 2 (two) times daily., Disp: 60 tablet, Rfl: 1 .  omeprazole (PRILOSEC) 20 MG capsule, Take 1 capsule (20 mg total) by mouth every morning., Disp: 30 capsule, Rfl: 1 .  PACERONE 200 MG tablet, TAKE 1 TABLET EVERY MORNING, Disp: 90 tablet, Rfl: 0 .  potassium chloride SA (K-DUR,KLOR-CON) 20 MEQ tablet, Take 1 tablet (20 mEq total) by mouth daily., Disp: 30 tablet, Rfl: 3 .  senna (SENOKOT) 8.6 MG TABS tablet, Take 2 tablets (17.2 mg total) by mouth at bedtime., Disp: 120 each, Rfl: 0 .  sertraline (ZOLOFT) 50 MG tablet, TAKE 1 TABLET AT BEDTIME, Disp: 90 tablet, Rfl: 3 .  simvastatin (ZOCOR) 10 MG tablet, Take 1 tablet (10 mg total) by mouth every evening., Disp: 15 tablet, Rfl: 0 .  vitamin B-12 (CYANOCOBALAMIN) 1000 MCG tablet, Take 1 tablet (1,000 mcg total) by mouth every evening., Disp: 30 tablet, Rfl: 1 .  Vitamin D, Ergocalciferol,  (DRISDOL) 50000 UNITS CAPS capsule, Take 1 capsule (50,000 Units total) by mouth every 14 (fourteen) days., Disp: 4 capsule, Rfl: 1  ROS: History obtained from the patient  General ROS: negative for - chills, fatigue, fever, night sweats, weight gain or weight loss Psychological ROS: negative for - behavioral disorder, hallucinations, memory difficulties, mood swings or suicidal ideation Ophthalmic ROS: negative for - blurry vision, double vision, eye pain or loss of vision ENT ROS: negative for - epistaxis, nasal discharge, oral lesions, sore throat, tinnitus or vertigo Allergy and Immunology ROS: negative for - hives or itchy/watery eyes Hematological and Lymphatic ROS: negative for - bleeding problems, bruising or swollen lymph nodes Endocrine ROS: negative for - galactorrhea, hair pattern changes, polydipsia/polyuria or temperature intolerance Respiratory ROS: shortness of breath Cardiovascular ROS: negative for - chest pain, dyspnea on exertion, edema or irregular heartbeat Gastrointestinal ROS: negative for - abdominal pain, diarrhea, hematemesis, nausea/vomiting or stool incontinence Genito-Urinary ROS: negative for - dysuria, hematuria, incontinence or urinary frequency/urgency Musculoskeletal ROS: negative for - joint swelling or muscular weakness Neurological ROS: as noted in HPI Dermatological ROS: negative for rash and skin lesion changes  Physical Examination: Blood pressure 135/76, pulse 59, temperature 98 F (36.7 C), temperature source Oral, resp. rate 15, height 5\' 4"  (1.626 m), weight 78.6 kg (173 lb 4.5 oz), SpO2 100 %.  HEENT-  Normocephalic, no lesions, without obvious abnormality.  Normal external eye and conjunctiva.  Normal TM's bilaterally.  Normal auditory canals and external ears. Normal external nose, mucus membranes and septum.  Normal pharynx. Cardiovascular- S1, S2 normal, pulses palpable throughout   Lungs- chest clear, no wheezing, rales, normal symmetric  air entry Abdomen- soft, non-tender; bowel sounds normal; no masses,  no organomegaly Extremities- no edema Lymph-no adenopathy palpable Musculoskeletal-no joint tenderness, deformity or swelling Skin-warm and dry, no hyperpigmentation, vitiligo, or suspicious lesions  Neurological Examination Mental Status: Alert, oriented, thought content appropriate.  Speech fluent without evidence of aphasia.  Able to follow 3 step commands without difficulty. Cranial Nerves: II: Discs flat bilaterally; Visual fields grossly normal, pupils equal, round, reactive to light and accommodation III,IV, VI: ptosis not present, extra-ocular motions intact bilaterally V,VII: smile symmetric, facial light touch sensation decreased the right VIII: hearing normal bilaterally IX,X: gag reflex present XI: bilateral shoulder shrug XII: midline tongue extension Motor: Right : Upper extremity   5/5    Left:     Upper extremity   5/5  Lower extremity   5/5     Lower extremity   5/5 Tone and  bulk:normal tone throughout; no atrophy noted Sensory: Pinprick and light touch decreased in the RUE and the LLE (LLE numbness is chronic). Deep Tendon Reflexes: 2+ and symmetric throughout Plantars: Right: downgoing   Left: downgoing Cerebellar: normal finger-to-nose heel-to-shin testing bilaterally Gait: not tested due to safety concerns   Laboratory Studies:  Basic Metabolic Panel:  Recent Labs Lab 12/02/14 1453 12/02/14 1502  NA 138 140  K 3.8 3.8  CL 105 106  CO2 24  --   GLUCOSE 94 94  BUN 18 22*  CREATININE 1.16* 1.10*  CALCIUM 9.1  --     Liver Function Tests:  Recent Labs Lab 12/02/14 1453  AST 25  ALT 16  ALKPHOS 69  BILITOT 0.5  PROT 6.6  ALBUMIN 3.5   No results for input(s): LIPASE, AMYLASE in the last 168 hours. No results for input(s): AMMONIA in the last 168 hours.  CBC:  Recent Labs Lab 12/02/14 1453 12/02/14 1502  WBC 5.8  --   NEUTROABS 3.8  --   HGB 12.4 13.9  HCT 38.5  41.0  MCV 86.5  --   PLT 144*  --     Cardiac Enzymes: No results for input(s): CKTOTAL, CKMB, CKMBINDEX, TROPONINI in the last 168 hours.  BNP: Invalid input(s): POCBNP  CBG:  Recent Labs Lab 12/02/14 1430  GLUCAP 120*    Microbiology: Results for orders placed or performed during the hospital encounter of 07/24/13  Urine culture     Status: None   Collection Time: 07/24/13  8:17 PM  Result Value Ref Range Status   Specimen Description URINE, RANDOM  Final   Special Requests NONE  Final   Culture  Setup Time   Final    07/25/2013 02:31 Performed at [place]   Final    >=100,000 COLONIES/ML Performed at [place] Performed at [facility]   Report Status 07/26/2013 FINAL  Final   Organism ID, Bacteria ESCHERICHIA COLI  Final      Susceptibility   Escherichia coli - MIC*    AMPICILLIN >=32 RESISTANT Resistant     CEFAZOLIN <=4 SENSITIVE Sensitive     CEFTRIAXONE <=1 SENSITIVE Sensitive     CIPROFLOXACIN <=0.25 SENSITIVE Sensitive     GENTAMICIN <=1 SENSITIVE Sensitive     LEVOFLOXACIN <=0.12 SENSITIVE Sensitive     NITROFURANTOIN 32 SENSITIVE Sensitive     TOBRAMYCIN <=1 SENSITIVE Sensitive     TRIMETH/SULFA <=20 SENSITIVE Sensitive     PIP/TAZO <=4 SENSITIVE Sensitive     * ESCHERICHIA COLI    Coagulation Studies:  Recent Labs  12/02/14 1453  LABPROT 14.9  INR 1.16    Urinalysis: No results for input(s): COLORURINE, LABSPEC, PHURINE, GLUCOSEU, HGBUR, BILIRUBINUR, KETONESUR, PROTEINUR, UROBILINOGEN, NITRITE, LEUKOCYTESUR in the last 168 hours.  Invalid input(s): APPERANCEUR  Lipid Panel:    Component Value Date/Time   CHOL 178 07/25/2013 0635   TRIG 132 07/25/2013 0635   HDL 63 07/25/2013 0635   CHOLHDL 2.8 07/25/2013 0635   VLDL 26 07/25/2013 0635   LDLCALC 89 07/25/2013 0635    HgbA1C:  Lab Results  Component Value Date   HGBA1C 6.1* 07/25/2013     Urine Drug Screen:      Component Value Date/Time   LABOPIA NONE DETECTED 07/24/2013 2017   COCAINSCRNUR NONE DETECTED 07/24/2013 2017   LABBENZ NONE DETECTED 07/24/2013 2017   AMPHETMU NONE DETECTED 07/24/2013  2017   THCU NONE DETECTED 07/24/2013 2017   LABBARB NONE DETECTED 07/24/2013 2017    Alcohol Level: No results for input(s): ETH in the last 168 hours.  Other results: EKG: atrial paced rhythm.  Imaging: Ct Head (brain) Wo Contrast  12/02/2014   CLINICAL DATA:  81 year old female with aphasia and shortness of breath. Code stroke.  EXAM: CT HEAD WITHOUT CONTRAST  TECHNIQUE: Contiguous axial images were obtained from the base of the skull through the vertex without intravenous contrast.  COMPARISON:  Head CT 04/11/2014.  FINDINGS: Well-defined area of low attenuation with overlying cortical thinning in the left frontal region, compatible with encephalomalacia/gliosis related to prior left frontal infarct. This is unchanged. There is also a well-defined focus of low attenuation in the left cerebellar hemisphere which appears new compared to the prior study, but compatible with an area of prior ischemia. Mild cerebral atrophy. Patchy and confluent areas of decreased attenuation are noted throughout the deep and periventricular white matter of the cerebral hemispheres bilaterally, compatible with chronic microvascular ischemic disease. No acute intracranial abnormalities. Specifically, no evidence of acute intracranial hemorrhage, no definite findings of acute/subacute cerebral ischemia, no mass, mass effect, hydrocephalus or abnormal intra or extra-axial fluid collections. Visualized paranasal sinuses and mastoids are well pneumatized. No acute displaced skull fractures are identified.  IMPRESSION: 1. No acute intracranial abnormalities. 2. Chronic ischemic changes, as above, most notable for old left frontal infarction. These results were called by telephone at the time of interpretation  on 12/02/2014 at 3:08 pm to Dr. Doy Mince, who verbally acknowledged these results.   Electronically Signed   By: Vinnie Langton M.D.   On: 12/02/2014 15:09    Assessment: 81 y.o. female presenting with a syncopal episode and right sided weakness with slurred speech that has resolved.  Patient with a history of atrial fibrillation on ASA with pacer.  Initial NIHSS of 1.  Head CT personally reviewed and shows no acute changes.  Patient currently remains within the window.  To remain on ASA.    Stroke Risk Factors - atrial fibrillation, hyperlipidemia and hypertension  Plan: 1. HgbA1c, fasting lipid panel 2. PT consult, OT consult, Speech consult 3. Echocardiogram 4. Carotid dopplers 5. Prophylactic therapy-Continue ASA daily 6. NPO until RN stroke swallow screen 7. Telemetry monitoring 8. Frequent neuro checks  Case discussed with Dr. Christa See, MD [facility] [phone] 12/02/2014, 4:48 PM

## 2014-12-02 NOTE — ED Notes (Signed)
Patient transported to X-ray 

## 2014-12-02 NOTE — ED Notes (Signed)
MD at bedside. 

## 2014-12-02 NOTE — ED Notes (Signed)
While pt was getting blood drawn, pt became to have aphasia, right sided weakness followed by unresponsive. Pt taken to charge desk where she was evaluated by Dr Ashok Cordia and then by Dr Doy Mince. Dr Doy Mince called code stroke on pt.

## 2014-12-02 NOTE — ED Notes (Signed)
Pt c/o shortness of breath onset last night and another episode this morning. Pt denies pain at present. Pt talking in complete sentences without difficulty. Pt denies swelling in feet or ankles, but does feel bloated in abdominal area x 1 month increased today.

## 2014-12-02 NOTE — ED Provider Notes (Signed)
CSN: [id number]     Arrival date & time 12/02/14  1343 History   First MD Initiated Contact with Patient 12/02/14 1443     Chief Complaint  Patient presents with  . Shortness of Breath  . Code Stroke    An emergency department physician performed an initial assessment on this suspected stroke patient at 1435. (Consider location/radiation/quality/duration/timing/severity/associated sxs/prior Treatment) HPI Comments: 81 yo female who presented to the ED due to shortness of breath, but who suddenly developed right sided weakness, slumped to the right of the chair, right facial droop, and slurred speech during triage process.    At time of interview, she denied having any current shortness of breath, but described her earlier SOB and difficulty catching her breath.    Patient is a 81 y.o. female presenting with neurologic complaint.  Neurologic Problem This is a new problem. The current episode started less than 1 hour ago (during ED triage). The problem occurs constantly. Progression since onset: partially resolved. Associated symptoms include shortness of breath. Pertinent negatives include no chest pain, no abdominal pain and no headaches. Nothing aggravates the symptoms. Nothing relieves the symptoms.    Past Medical History  Diagnosis Date  . Intracerebral hemorrhage     a. 04/2013 in setting of xarelto therapy.  . Atrial fibrillation 2012    a. Dx in 2012;  b. Rhythm controlled - amiodarone, previously anticoagulated with xarelto (d/c'd 04/2013 in setting of [place]);  b. 04/2013 Echo: EF 55-60%, Gr 1 DD, mild MR, mod dil LA, PAsP 74mmhg  . Hypertension   . Depression   . Arthritis   . Presence of permanent cardiac pacemaker     a. 2012 MDT, placed in [place] (probable tachy-brady).  . Pacemaker   . SSS (sick sinus syndrome)     s/p dual chamber pacemaker  . PAF (paroxysmal atrial fibrillation)   . Aphasia     Occasional aphasia & facial numbness from a left frontal lobe  intercerebral hemorrhage on Xarelto on 05/18/13.  Marland Kitchen Pericardial effusion   . Mitral valve prolapse   . Hyperlipidemia   . Rheumatoid arthritis   . SOB (shortness of breath)     Occasional SOB  . Palpitations     Occasional.  . Peripheral edema     Because she has been standing on her feet more than usual   . Headache    Past Surgical History  Procedure Laterality Date  . Pacemaker insertion      MDT implanted at [facility]  . Abdominal hysterectomy    . Breast lumpectomy    . Appendectomy    . Bowel resection    . Lead revision  09/17/11    s/p atrial lead revision   Family History  Problem Relation Age of Onset  . Other      negative for premature CAD.  Marland Kitchen Aneurysm Father    History  Substance Use Topics  . Smoking status: Former Smoker    Quit date: 11/26/1968  . Smokeless tobacco: Not on file  . Alcohol Use: Yes   OB History    No data available     Review of Systems  Respiratory: Positive for shortness of breath.   Cardiovascular: Negative for chest pain.  Gastrointestinal: Negative for abdominal pain.  Neurological: Negative for headaches.  All other systems reviewed and are negative.     Allergies  Latex  Home Medications   Prior to Admission medications   Medication Sig Start Date End Date Taking? Authorizing  Provider  aspirin 325 MG EC tablet Take 325 mg by mouth every 6 (six) hours as needed (for headaches).    Yes Historical Provider, MD  aspirin EC 81 MG EC tablet Take 1 tablet (81 mg total) by mouth daily. 06/08/13  Yes Ivan Anchors Love, PA-C  famotidine (PEPCID) 20 MG tablet Take 1 tablet (20 mg total) by mouth 2 (two) times daily. Patient taking differently: Take 20 mg by mouth every other day.  06/08/13  Yes Ivan Anchors Love, PA-C  folic acid (FOLVITE) 250 MCG tablet Take 400 mcg by mouth every evening.   Yes Historical Provider, MD  furosemide (LASIX) 40 MG tablet Take 40 mg by mouth daily.   Yes Historical Provider, MD  KEPPRA 500 MG tablet TAKE 1  TABLET TWICE A DAY 06/25/14  Yes Garvin Fila, MD  metoprolol tartrate (LOPRESSOR) 25 MG tablet Take 1 tablet (25 mg total) by mouth 2 (two) times daily. 06/08/13  Yes Ivan Anchors Love, PA-C  omeprazole (PRILOSEC) 20 MG capsule Take 1 capsule (20 mg total) by mouth every morning. Patient taking differently: Take 20 mg by mouth as needed (for heartburn).  06/08/13  Yes Ivan Anchors Love, PA-C  PACERONE 200 MG tablet TAKE 1 TABLET EVERY MORNING 10/02/14  Yes Thompson Grayer, MD  potassium chloride SA (K-DUR,KLOR-CON) 20 MEQ tablet Take 1 tablet (20 mEq total) by mouth daily. 03/20/14  Yes Meredith Staggers, MD  senna (SENOKOT) 8.6 MG TABS tablet Take 2 tablets (17.2 mg total) by mouth at bedtime. 06/08/13  Yes Ivan Anchors Love, PA-C  sertraline (ZOLOFT) 50 MG tablet TAKE 1 TABLET AT BEDTIME 07/21/14  Yes Garvin Fila, MD  simvastatin (ZOCOR) 10 MG tablet Take 1 tablet (10 mg total) by mouth every evening. 08/25/13  Yes Philmore Pali, NP  vitamin B-12 (CYANOCOBALAMIN) 1000 MCG tablet Take 1 tablet (1,000 mcg total) by mouth every evening. 06/08/13  Yes Ivan Anchors Love, PA-C  Vitamin D, Ergocalciferol, (DRISDOL) 50000 UNITS CAPS capsule Take 1 capsule (50,000 Units total) by mouth every 14 (fourteen) days. 06/08/13  Yes Pamela S Love, PA-C   BP 114/51 mmHg  Pulse 58  Temp(Src) 97.7 F (36.5 C) (Oral)  Resp 18  Ht 5\' 4"  (1.626 m)  Wt 173 lb 4.5 oz (78.6 kg)  BMI 29.73 kg/m2  SpO2 97% Physical Exam  Constitutional: She is oriented to person, place, and time. She appears well-developed and well-nourished. No distress.  HENT:  Head: Normocephalic and atraumatic.  Mouth/Throat: Oropharynx is clear and moist.  Eyes: Conjunctivae are normal. Pupils are equal, round, and reactive to light. No scleral icterus.  Neck: Neck supple.  Cardiovascular: Normal rate, regular rhythm, normal heart sounds and intact distal pulses.   No murmur heard. Pulmonary/Chest: Effort normal and breath sounds normal. No stridor. No respiratory  distress. She has no rales.  Abdominal: Soft. Bowel sounds are normal. She exhibits no distension. There is no tenderness.  Musculoskeletal: Normal range of motion.  Neurological: She is alert and oriented to person, place, and time. She has normal strength. A cranial nerve deficit (mild right sided facial weakness) is present. GCS eye subscore is 4. GCS verbal subscore is 5. GCS motor subscore is 6.  Speech slightly slurred.  Some word finding difficulty.   Skin: Skin is warm and dry. No rash noted.  Psychiatric: She has a normal mood and affect. Her behavior is normal.  Nursing note and vitals reviewed.   ED Course  Procedures (including critical  care time) Labs Review Labs Reviewed  CBC - Abnormal; Notable for the following:    Platelets 144 (*)    All other components within normal limits  DIFFERENTIAL - Abnormal; Notable for the following:    Monocytes Relative 14 (*)    All other components within normal limits  COMPREHENSIVE METABOLIC PANEL - Abnormal; Notable for the following:    Creatinine, Ser 1.16 (*)    GFR calc non Af Amer 43 (*)    GFR calc Af Amer 49 (*)    All other components within normal limits  CBG MONITORING, ED - Abnormal; Notable for the following:    Glucose-Capillary 120 (*)    All other components within normal limits  I-STAT CHEM 8, ED - Abnormal; Notable for the following:    BUN 22 (*)    Creatinine, Ser 1.10 (*)    All other components within normal limits  PROTIME-INR  APTT  I-STAT TROPOININ, ED  CBG MONITORING, ED    Imaging Review Ct Head (brain) Wo Contrast  12/02/2014   CLINICAL DATA:  81 year old female with aphasia and shortness of breath. Code stroke.  EXAM: CT HEAD WITHOUT CONTRAST  TECHNIQUE: Contiguous axial images were obtained from the base of the skull through the vertex without intravenous contrast.  COMPARISON:  Head CT 04/11/2014.  FINDINGS: Well-defined area of low attenuation with overlying cortical thinning in the left frontal  region, compatible with encephalomalacia/gliosis related to prior left frontal infarct. This is unchanged. There is also a well-defined focus of low attenuation in the left cerebellar hemisphere which appears new compared to the prior study, but compatible with an area of prior ischemia. Mild cerebral atrophy. Patchy and confluent areas of decreased attenuation are noted throughout the deep and periventricular white matter of the cerebral hemispheres bilaterally, compatible with chronic microvascular ischemic disease. No acute intracranial abnormalities. Specifically, no evidence of acute intracranial hemorrhage, no definite findings of acute/subacute cerebral ischemia, no mass, mass effect, hydrocephalus or abnormal intra or extra-axial fluid collections. Visualized paranasal sinuses and mastoids are well pneumatized. No acute displaced skull fractures are identified.  IMPRESSION: 1. No acute intracranial abnormalities. 2. Chronic ischemic changes, as above, most notable for old left frontal infarction. These results were called by telephone at the time of interpretation on 12/02/2014 at 3:08 pm to Dr. Doy Mince, who verbally acknowledged these results.   Electronically Signed   By: Vinnie Langton M.D.   On: 12/02/2014 15:09  All radiology studies independently viewed by me.      EKG Interpretation   Date/Time:  Saturday December 02 2014 14:19:17 EDT Ventricular Rate:  61 PR Interval:  210 QRS Duration: 88 QT Interval:  474 QTC Calculation: 477 R Axis:   -54 Text Interpretation:  Atrial-paced rhythm with prolonged AV conduction  Left axis deviation Septal infarct , age undetermined Possible Lateral  infarct , age undetermined Abnormal ECG No significant change was found  Confirmed by [facility]  MD, TREY ([id number]) on 12/02/2014 4:37:06 PM      MDM   Final diagnoses:  Cerebral infarction due to unspecified mechanism    81 yo female presenting with SOB, but who developed acute neuro deficits concerning  for TIA/Stroke.  Symptoms improved during ED course, but she had some persistent right facial numbness.  CT head negative.  Plan admit for further stroke workup.  Regarding her SOB, currently asymptomatic, normal breath sounds, a paced EKG which was otherwise unchanged.       Serita Grit, MD 12/02/14 [phone]

## 2014-12-02 NOTE — Progress Notes (Signed)
Received from ER via stretcher; patient is alert and oriented times three; ambulated patient to the bathrooom with stand by assist; patient denies dizziness; returned patient to bed; applied heart monitor; oriented patient to room and unit routine; daughter at bedside; history completed; fall safety reviewed and bed alarm is on.

## 2014-12-03 ENCOUNTER — Inpatient Hospital Stay (HOSPITAL_COMMUNITY): Payer: Medicare Other

## 2014-12-03 ENCOUNTER — Other Ambulatory Visit: Payer: Self-pay | Admitting: Neurology

## 2014-12-03 DIAGNOSIS — R55 Syncope and collapse: Secondary | ICD-10-CM

## 2014-12-03 DIAGNOSIS — G459 Transient cerebral ischemic attack, unspecified: Secondary | ICD-10-CM

## 2014-12-03 DIAGNOSIS — R0609 Other forms of dyspnea: Secondary | ICD-10-CM

## 2014-12-03 DIAGNOSIS — R0600 Dyspnea, unspecified: Secondary | ICD-10-CM

## 2014-12-03 DIAGNOSIS — I4891 Unspecified atrial fibrillation: Secondary | ICD-10-CM

## 2014-12-03 LAB — LIPID PANEL
CHOL/HDL RATIO: 3 ratio
Cholesterol: 176 mg/dL (ref 0–200)
HDL: 59 mg/dL (ref >40)
LDL CALC: 85 mg/dL (ref 0–99)
TRIGLYCERIDES: 161 mg/dL — AB (ref <150)
VLDL: 32 mg/dL (ref 0–40)

## 2014-12-03 MED ORDER — SIMVASTATIN 20 MG PO TABS
20.0000 mg | ORAL_TABLET | Freq: Every evening | ORAL | Status: DC
  Administered 2014-12-03: 20 mg via ORAL
  Filled 2014-12-03: qty 1

## 2014-12-03 NOTE — Evaluation (Signed)
Physical Therapy Evaluation Patient Details Name: Ann Kaufman MRN: [id number] DOB: Jan 07, 1930 Today's Date: 12/03/2014   History of Present Illness  Ann Kaufman is a 81 y.o. female with history of atrial fibrillation, previous intracerebral hemorrhage while on Xarelto, hypertension, permanent pacemaker, and hyperlipidemia, presenting with shortness of breath, right facial droop, slurred speech, and subsequent unresponsiveness.  Clinical Impression  Pt admitted with the above complications. Pt currently with functional limitations due to the deficits listed below (see PT Problem List). Demonstrates antalgic gait pattern, reports history of Lt hip pain at baseline. Ambulates >500 feet without an assistive device, no loss of balance. Reports episodes of dizziness at home that are exacerbated by head turns; noted to have abnormal with VOR suggesting possible vestibular hypofunction. Educated on x1 viewing exercise and recommended follow up with OP PT to progress. Pt will benefit from skilled PT to increase their independence and safety with mobility to allow discharge to the venue listed below.       Follow Up Recommendations Outpatient PT (For vestibular rehab)    Equipment Recommendations  None recommended by PT    Recommendations for Other Services       Precautions / Restrictions Precautions Precautions: None Restrictions Weight Bearing Restrictions: No      Mobility  Bed Mobility               General bed mobility comments: Standing with RN when PT entered room  Transfers                 General transfer comment: Standing With RN when PT entered room. Good control with descent into chair.  Ambulation/Gait Ambulation/Gait assistance: Supervision Ambulation Distance (Feet): 525 Feet Assistive device: None (Holding dynamap) Gait Pattern/deviations: Step-through pattern;Decreased step length - right;Decreased stance time - left;Antalgic Gait velocity:  decreased   General Gait Details: Initially holding dynampa, ambulated majority of distance without assistive device. Mildly antalgic, reports history of Lt hip pain. Pt able to step backwards safely. Mildy guarded. Performed horizontal and vertical headturns without loss of balance. No drift noted. No physical assist required during ambulatory bout today. SpO2 maintained 96% and greater on room air.  Stairs            Wheelchair Mobility    Modified Rankin (Stroke Patients Only) Modified Rankin (Stroke Patients Only) Pre-Morbid Rankin Score: No symptoms Modified Rankin: Moderately severe disability     Balance Overall balance assessment: Needs assistance Sitting-balance support: No upper extremity supported;Feet supported Sitting balance-Leahy Scale: Normal     Standing balance support: No upper extremity supported Standing balance-Leahy Scale: Good                               Pertinent Vitals/Pain Pain Assessment: No/denies pain    Home Living Family/patient expects to be discharged to:: Private residence Living Arrangements: Alone Available Help at Discharge: Family (daughter lives 8 miles away) Type of Home: Apartment Home Access: Level entry     Home Layout: One level Home Equipment: Environmental consultant - 4 wheels;Walker - 2 wheels;Cane - single point;Grab bars - tub/shower;Grab bars - toilet      Prior Function Level of Independence: Independent with assistive device(s)         Comments: occasionally uses cane or Rollator     Hand Dominance   Dominant Hand: Left    Extremity/Trunk Assessment   Upper Extremity Assessment: Defer to OT evaluation  Lower Extremity Assessment: Overall WFL for tasks assessed (Reports occasional Lt hip pain at baseline)         Communication   Communication: No difficulties  Cognition Arousal/Alertness: Awake/alert Behavior During Therapy: WFL for tasks assessed/performed Overall Cognitive Status:  Within Functional Limits for tasks assessed                      General Comments General comments (skin integrity, edema, etc.): Vestibular hypofunction, difficulty with x1 viewing. Reports she has some episodes of dizziness at home when ambulating or turning her head. Does not feel symptoms when she lies down or upon sitting from a supine position. Educated on x1 viewing exercise 3 sets of 30, 3x/day. Some difficutly with maintaining eyes on target, especially with head turns to the right.    Exercises Other Exercises Other Exercises: x1 viewing exercise - x30 seated      Assessment/Plan    PT Assessment Patient needs continued PT services  PT Diagnosis Abnormality of gait   PT Problem List Decreased activity tolerance;Decreased balance;Decreased mobility  PT Treatment Interventions DME instruction;Gait training;Functional mobility training;Therapeutic activities;Therapeutic exercise;Balance training;Patient/family education   PT Goals (Current goals can be found in the Care Plan section) Acute Rehab PT Goals Patient Stated Goal: be able to drive again PT Goal Formulation: With patient Time For Goal Achievement: 12/17/14 Potential to Achieve Goals: Good    Frequency Min 4X/week   Barriers to discharge Decreased caregiver support lvies alone    Co-evaluation               End of Session   Activity Tolerance: Patient tolerated treatment well Patient left: in chair;with call bell/phone within reach;with chair alarm set Nurse Communication: Mobility status         Time: 1026-1100 (MD spoke with pt. -10 min non-therapeutic) PT Time Calculation (min) (ACUTE ONLY): 34 min   Charges:   PT Evaluation $Initial PT Evaluation Tier I: 1 Procedure PT Treatments $Gait Training: 23-37 mins   PT G CodesEllouise Newer 12/03/2014, 12:57 PM Camille Bal [place], [place]

## 2014-12-03 NOTE — Progress Notes (Signed)
[facility] PROGRESS NOTE  SHEMIA BEVEL GGY:[id number] DOB: Oct 14, 1929 DOA: 12/02/2014 PCP: Velna Hatchet, MD  Assessment/Plan:  Principal Problem:   Acute right-sided weakness: resolved. TIA workup underway. Neuro ordered EEG and changed ASA to 325 mg. neurology reportedly having pacemaker interrogated. Active Problems:   HTN (hypertension)   Atrial fibrillation, chronic. On aspirin only due to previous intracranial hemorrhage.   History of ICH (intracerebral hemorrhage)   Pacemaker   Seizure disorder after stroke   Pulmonary HTN   DOE (dyspnea on exertion):  Resolved. Unclear what caused this.   Chronic diastolic heart failure, NYHA class 1  HPI/Subjective: No dyspnea. No weakness. Feels back to baseline.  Objective: Filed Vitals:   12/03/14 0709  BP: 110/47  Pulse: 73  Temp: 98.2 F (36.8 C)  Resp: 18    Intake/Output Summary (Last 24 hours) at 12/03/14 1041 Last data filed at 12/02/14 2148  Gross per 24 hour  Intake    240 ml  Output      0 ml  Net    240 ml   Filed Weights   12/02/14 1420  Weight: 78.6 kg (173 lb 4.5 oz)    Exam:   General:  Alert, oriented. Witness patient walking the unit without difficulty with physical therapy.  Cardiovascular: Regular rate rhythm without murmurs gallops rubs  Respiratory: Clear to auscultation bilaterally without wheeze rhonchi or rales  Abdomen: Soft nontender nondistended  Ext: No clubbing cyanosis or edema  Neurologic: Cranial nerves and strength intact.  Basic Metabolic Panel:  Recent Labs Lab 12/02/14 1453 12/02/14 1502  NA 138 140  K 3.8 3.8  CL 105 106  CO2 24  --   GLUCOSE 94 94  BUN 18 22*  CREATININE 1.16* 1.10*  CALCIUM 9.1  --    Liver Function Tests:  Recent Labs Lab 12/02/14 1453  AST 25  ALT 16  ALKPHOS 69  BILITOT 0.5  PROT 6.6  ALBUMIN 3.5   No results for input(s): LIPASE, AMYLASE in the last 168 hours. No results for input(s): AMMONIA in the last 168  hours. CBC:  Recent Labs Lab 12/02/14 1453 12/02/14 1502  WBC 5.8  --   NEUTROABS 3.8  --   HGB 12.4 13.9  HCT 38.5 41.0  MCV 86.5  --   PLT 144*  --    Cardiac Enzymes: No results for input(s): CKTOTAL, CKMB, CKMBINDEX, TROPONINI in the last 168 hours. BNP (last 3 results) No results for input(s): BNP in the last 8760 hours.  ProBNP (last 3 results) No results for input(s): PROBNP in the last 8760 hours.  CBG:  Recent Labs Lab 12/02/14 1430  GLUCAP 120*    No results found for this or any previous visit (from the past 240 hour(s)).   Studies: Dg Chest 2 View  12/02/2014   CLINICAL DATA:  Shortness of breath and right-sided weakness. Dyspnea on exertion.  EXAM: CHEST  2 VIEW  COMPARISON:  06/12/2013  FINDINGS: Mild osteopenia. Pacer with leads at right atrium and right ventricle. No lead discontinuity. Midline trachea. Normal heart size. Atherosclerosis in the transverse aorta. No pleural effusion or pneumothorax. No congestive failure. Clear lungs.  IMPRESSION: No acute cardiopulmonary disease.  Aortic atherosclerosis.   Electronically Signed   By: Abigail Miyamoto M.D.   On: 12/02/2014 18:28   Ct Head (brain) Wo Contrast  12/02/2014   CLINICAL DATA:  81 year old female with aphasia and shortness of breath. Code stroke.  EXAM: CT HEAD WITHOUT CONTRAST  TECHNIQUE:  Contiguous axial images were obtained from the base of the skull through the vertex without intravenous contrast.  COMPARISON:  Head CT 04/11/2014.  FINDINGS: Well-defined area of low attenuation with overlying cortical thinning in the left frontal region, compatible with encephalomalacia/gliosis related to prior left frontal infarct. This is unchanged. There is also a well-defined focus of low attenuation in the left cerebellar hemisphere which appears new compared to the prior study, but compatible with an area of prior ischemia. Mild cerebral atrophy. Patchy and confluent areas of decreased attenuation are noted  throughout the deep and periventricular white matter of the cerebral hemispheres bilaterally, compatible with chronic microvascular ischemic disease. No acute intracranial abnormalities. Specifically, no evidence of acute intracranial hemorrhage, no definite findings of acute/subacute cerebral ischemia, no mass, mass effect, hydrocephalus or abnormal intra or extra-axial fluid collections. Visualized paranasal sinuses and mastoids are well pneumatized. No acute displaced skull fractures are identified.  IMPRESSION: 1. No acute intracranial abnormalities. 2. Chronic ischemic changes, as above, most notable for old left frontal infarction. These results were called by telephone at the time of interpretation on 12/02/2014 at 3:08 pm to Dr. Doy Mince, who verbally acknowledged these results.   Electronically Signed   By: Vinnie Langton M.D.   On: 12/02/2014 15:09    Scheduled Meds: . amiodarone  200 mg Oral q morning - 10a  . aspirin EC  81 mg Oral Daily  . famotidine  20 mg Oral QODAY  . folic acid  737 mcg Oral QPM  . furosemide  40 mg Oral Daily  . levETIRAcetam  500 mg Oral BID  . metoprolol tartrate  25 mg Oral BID  . pantoprazole  40 mg Oral Daily  . potassium chloride SA  20 mEq Oral Daily  . senna  2 tablet Oral QHS  . sertraline  50 mg Oral QHS  . simvastatin  10 mg Oral QPM  . vitamin B-12  1,000 mcg Oral QPM  . [START ON 12/07/2014] Vitamin D (Ergocalciferol)  50,000 Units Oral Q14 Days   Continuous Infusions:   Time spent: 35 minutes  [facility] Pager [phone]. If 7PM-7AM, please contact night-coverage at [email or web address], password [facility] 12/03/2014, 10:41 AM  LOS: 1 day

## 2014-12-03 NOTE — Progress Notes (Signed)
Echocardiogram 2D Echocardiogram has been performed.  Ann Kaufman 12/03/2014, 8:55 AM

## 2014-12-03 NOTE — Progress Notes (Signed)
STROKE TEAM PROGRESS NOTE   HISTORY Ann Kaufman is an 81 y.o. female who presented to the ED today due to shortness of breath. Was in triage getting blood drawn when she slumped over to the right, developed right facial droop and her speech became slurred. Patient then became unresponsive. Code stroke was called at that time. When she came around her speech continued to be slurred. By the time of evaluation after CT her symptoms were improving. Initial NIHSS of 1.   Date last known well: Date: 12/02/2014 Time last known well: Time: 14:29 tPA Given: No: Resolution symptoms   SUBJECTIVE (INTERVAL HISTORY) No family members present. The patient feels she is improving however her speech is not yet back to normal.   OBJECTIVE Temp:  [97.5 F (36.4 C)-98.2 F (36.8 C)] 98.2 F (36.8 C) (06/12 0709) Pulse Rate:  [58-88] 73 (06/12 0709) Cardiac Rhythm:  [-] Atrial paced (06/11 2004) Resp:  [13-19] 18 (06/12 0709) BP: (101-154)/(47-109) 110/47 mmHg (06/12 0709) SpO2:  [95 %-100 %] 97 % (06/12 0709) Weight:  [78.6 kg (173 lb 4.5 oz)] 78.6 kg (173 lb 4.5 oz) (06/11 1420)   Recent Labs Lab 12/02/14 1430  GLUCAP 120*    Recent Labs Lab 12/02/14 1453 12/02/14 1502  NA 138 140  K 3.8 3.8  CL 105 106  CO2 24  --   GLUCOSE 94 94  BUN 18 22*  CREATININE 1.16* 1.10*  CALCIUM 9.1  --     Recent Labs Lab 12/02/14 1453  AST 25  ALT 16  ALKPHOS 69  BILITOT 0.5  PROT 6.6  ALBUMIN 3.5    Recent Labs Lab 12/02/14 1453 12/02/14 1502  WBC 5.8  --   NEUTROABS 3.8  --   HGB 12.4 13.9  HCT 38.5 41.0  MCV 86.5  --   PLT 144*  --    No results for input(s): CKTOTAL, CKMB, CKMBINDEX, TROPONINI in the last 168 hours.  Recent Labs  12/02/14 1453  LABPROT 14.9  INR 1.16   No results for input(s): COLORURINE, LABSPEC, PHURINE, GLUCOSEU, HGBUR, BILIRUBINUR, KETONESUR, PROTEINUR, UROBILINOGEN, NITRITE, LEUKOCYTESUR in the last 72 hours.  Invalid input(s): APPERANCEUR      Component Value Date/Time   CHOL 176 12/03/2014 0555   TRIG 161* 12/03/2014 0555   HDL 59 12/03/2014 0555   CHOLHDL 3.0 12/03/2014 0555   VLDL 32 12/03/2014 0555   LDLCALC 85 12/03/2014 0555   Lab Results  Component Value Date   HGBA1C 6.1* 07/25/2013      Component Value Date/Time   LABOPIA NONE DETECTED 07/24/2013 2017   COCAINSCRNUR NONE DETECTED 07/24/2013 2017   LABBENZ NONE DETECTED 07/24/2013 2017   AMPHETMU NONE DETECTED 07/24/2013 2017   THCU NONE DETECTED 07/24/2013 2017   LABBARB NONE DETECTED 07/24/2013 2017    No results for input(s): ETH in the last 168 hours.  Dg Chest 2 View 12/02/2014    No acute cardiopulmonary disease.  Aortic atherosclerosis.     Ct Head (brain) Wo Contrast 12/02/2014    1. No acute intracranial abnormalities.  2. Chronic ischemic changes, as above, most notable for old left frontal infarction.     PHYSICAL EXAM Pleasant elderly Caucasian lady currently not in distress. . Afebrile. Head is nontraumatic. Neck is supple without bruit.    Cardiac exam no murmur or gallop. Pacemaker noted on the chest wall. Lungs are clear to auscultation. Distal pulses are well felt.  Neurological Exam : Awake alert oriented 3. Speech  is slow but clear without paraphasic errors or hesitancy. No dysarthria. Extraocular movements are full range without nystagmus. Face is symmetric without weakness. Fundi were not visualized. Vision acuity seems adequate. Palatal movements are normal. Tongue is midline. Motor system exam revealed no upper or lower extremity drift. Slightly diminished fine finger movements on the right. Orbits left over right upper extremity. Minimum weakness of right grip. Deep tendon reflexes are symmetric. Sensation is intact bilaterally. Plantars are downgoing. Gait was not tested.    ASSESSMENT/PLAN [name] is a 81 y.o. female with history of atrial fibrillation, previous intracerebral hemorrhage while on Xarelto,  hypertension, permanent pacemaker, and hyperlipidemia, presenting with shortness of breath, right facial droop, slurred speech, and subsequent unresponsiveness. She did not receive IV t-PA due to improvement in deficits.  Stroke / TIA:  Dominant - probably embolic secondary to atrial fibrillation. In the setting of a presyncopal event  Resultant  continued speech difficulties  MRI  not performed secondary to permanent pacemaker  MRA  not performed secondary to permanent pacemaker  Carotid Doppler pending  2D Echo pending  LDL 85  HgbA1c pending  SCDs for VTE prophylaxis  Diet Heart Room service appropriate?: Yes; Fluid consistency:: Thin  aspirin 81 mg orally every day prior to admission, now on aspirin 81 mg orally every day  Patient counseled to be compliant with her antithrombotic medications  Ongoing aggressive stroke risk factor management  Therapy recommendations: Pending  Disposition: Pending  Hypertension  Home meds: Metoprolol  Blood pressure mildly low    Hyperlipidemia  Home meds:  Zocor 10 mg resumed in hospital  LDL 85, goal < 70  Increase Zocor to 20 mg.  Continue statin at discharge     Other Stroke Risk Factors  Advanced age  Former cigarette smoker, quit smoking 46 years ago   ETOH use  Obesity, Body mass index is 29.73 kg/(m^2).   Hx stroke/TIA   Other Active Problems  Renal insufficiency    Other Pertinent History  Previous intracerebral hemorrhage while on Xarelto.     PLAN  Have asked cardiology to interrogate pacemaker.  Repeat CT of head after 3 pm today. (24 hours from previous CT)  Check EEG  Consider increasing aspirin to 325 mg daily  Hospital day # 1  Mikey Bussing PA-C [facility] Pager [phone] 12/03/2014, 10:53 AM I have personally examined this patient, reviewed notes, independently viewed imaging studies, participated in medical decision making and plan of care. I have  made any additions or clarifications directly to the above note. Agree with note above.  She presented with shortness of breath and apparently her syncopal event and was found to have some focal facial weakness and speech difficulties and right-sided weakness which are transient. She does have a remote history of left frontal intracerebral hemorrhage and seizures and remains at risk for recurrent strokes, seizures, TIAs or neurological worsening and needs ongoing evaluation. Recommend change aspirin dose from 81 to 325 mg daily and continue Keppra and the current dose. Check EEG for seizures, repeat head CT scan and interrogate the pacemaker for malfunction.  Antony Contras, MD Medical Director [facility] Pager: [phone] 12/03/2014 11:31 AM     To contact Stroke Continuity provider, please refer to [email or web address] After hours, contact General Neurology

## 2014-12-03 NOTE — Progress Notes (Signed)
*  PRELIMINARY RESULTS* Vascular Ultrasound Carotid Duplex (Doppler) has been completed.  Preliminary findings: Bilateral:  1-39% ICA stenosis.  Vertebral artery flow is antegrade.      Landry Mellow, RDMS, RVT  12/03/2014, 3:42 PM

## 2014-12-04 ENCOUNTER — Inpatient Hospital Stay (HOSPITAL_COMMUNITY): Payer: Medicare Other

## 2014-12-04 DIAGNOSIS — G40909 Epilepsy, unspecified, not intractable, without status epilepticus: Secondary | ICD-10-CM

## 2014-12-04 DIAGNOSIS — G459 Transient cerebral ischemic attack, unspecified: Principal | ICD-10-CM

## 2014-12-04 LAB — HEMOGLOBIN A1C
Hgb A1c MFr Bld: 6.2 % — ABNORMAL HIGH (ref 4.8–5.6)
Mean Plasma Glucose: 131 mg/dL

## 2014-12-04 NOTE — Discharge Summary (Signed)
Physician Discharge Summary  Ann Kaufman LDJ:[id number] DOB: 12/14/1929 DOA: 12/02/2014  PCP: Velna Hatchet, MD  Admit date: 12/02/2014 Discharge date: 12/04/2014  Time spent: greater than 30 minutes  Recommendations for Outpatient Follow-up:     Discharge Diagnoses:  Principal Problem:   Acute right-sided weakness Active Problems:   HTN (hypertension)   Atrial fibrillation, chronic   History of ICH (intracerebral hemorrhage)   Pacemaker   Seizure disorder after stroke   Pulmonary HTN   DOE (dyspnea on exertion)   Chronic diastolic heart failure, NYHA class 1   TIA (transient ischemic attack)   Cerebral infarction due to unspecified mechanism   Discharge Condition: stable  Diet recommendation: Heart healthy  Filed Weights   12/02/14 1420  Weight: 78.6 kg (173 lb 4.5 oz)    History of present illness/Hospital Course:  81 y.o. female with history of atrial fibrillation, previous intracerebral hemorrhage while on Xarelto, hypertension, permanent pacemaker, and hyperlipidemia, presenting with shortness of breath, right facial droop, slurred speech, and subsequent unresponsiveness. She did not receive IV t-PA due to improvement in deficits.  Stroke / TIA: Dominant - probably embolic secondary to atrial fibrillation. In the setting of a presyncopal event  Resultant continued speech difficulties  MRI not performed secondary to permanent pacemaker  MRA not performed secondary to permanent pacemaker  Carotid Doppler The vertebral arteries appear patent with antegrade flow. - Findings consistent with 1- 39 percent stenosis involving the right internal carotid artery and the left internal carotid artery. - ICA/CCA ratio. right = 1.44. left = 0.94.  2D Echo  Left ventricle: The cavity size was normal. Wall thickness was increased in a pattern of mild LVH. Systolic function was normal. The estimated ejection fraction was in the range of 60% to 65%. Wall  motion was normal; there were no regional wall motion abnormalities. Doppler parameters are consistent with abnormal left ventricular relaxation (grade 1 diastolic dysfunction). - Aortic valve: Mildly calcified annulus. Trileaflet. There was trivial regurgitation. - Mitral valve: Calcified annulus. There was trivial regurgitation. - Left atrium: The atrium was mildly to moderately dilated. - Right ventricle: Pacer wire or catheter noted in right ventricle. - Right atrium: Pacer wire or catheter noted in right atrium. Central venous pressure (est): 3 mm Hg. - Atrial septum: No defect or patent foramen ovale was identified. - Tricuspid valve: There was moderate regurgitation. - Pulmonary arteries: PA peak pressure: 29 mm Hg (S). - Pericardium, extracardiac: There was no pericardial effusion.  Impressions:  - Mild LVH with LVEF 03-00%, grade 1 diastolic dysfunction. Mild to moderate left atrial enlargement. Device wire in right heart. Moderate tricuspid regurgitation with PASP 29 mmHg.  LDL 85. lipitor increased to 20 mg  HgbA1c 6.2  aspirin 81 mg orally every day prior to admission, increased to 325 mg dialy Pacemaker interrogated.  Hypertension  controlled  Hyperlipidemia  Home meds: Zocor 10 mg resumed in hospital  LDL 85, goal < 70  Increase Zocor to 20 mg.  Continue statin at discharge   by discharge, weakness improved  Atrial fibrillation: rate controlled. Not a candidate for anticoagulation due to previous intracranial hemorrhage  Chronic diastolic heart failure compensated  Procedures:none  Consultations:  neurology  Discharge Exam: Filed Vitals:   12/04/14 1354  BP: 141/69  Pulse: 60  Temp: 97.7 F (36.5 C)  Resp: 18    General: a and o Cardiovascular: RRR Respiratory: CTA  Discharge Instructions   Discharge Instructions    Diet - low sodium heart healthy  Complete by:  As directed      Increase activity slowly     Complete by:  As directed           Current Discharge Medication List    CONTINUE these medications which have NOT CHANGED   Details  aspirin 325 MG EC tablet Take 325 mg by mouth every 6 (six) hours as needed (for headaches).     famotidine (PEPCID) 20 MG tablet Take 1 tablet (20 mg total) by mouth 2 (two) times daily.    folic acid (FOLVITE) 240 MCG tablet Take 400 mcg by mouth every evening.    furosemide (LASIX) 40 MG tablet Take 40 mg by mouth daily.    metoprolol tartrate (LOPRESSOR) 25 MG tablet Take 1 tablet (25 mg total) by mouth 2 (two) times daily. Qty: 60 tablet, Refills: 1    omeprazole (PRILOSEC) 20 MG capsule Take 1 capsule (20 mg total) by mouth every morning. Qty: 30 capsule, Refills: 1    PACERONE 200 MG tablet TAKE 1 TABLET EVERY MORNING Qty: 90 tablet, Refills: 0    potassium chloride SA (K-DUR,KLOR-CON) 20 MEQ tablet Take 1 tablet (20 mEq total) by mouth daily. Qty: 30 tablet, Refills: 3   Associated Diagnoses: Other nontraumatic intracerebral hemorrhage    senna (SENOKOT) 8.6 MG TABS tablet Take 2 tablets (17.2 mg total) by mouth at bedtime. Qty: 120 each, Refills: 0    sertraline (ZOLOFT) 50 MG tablet TAKE 1 TABLET AT BEDTIME Qty: 90 tablet, Refills: 3    simvastatin (ZOCOR) 10 MG tablet Take 1 tablet (10 mg total) by mouth every evening. Qty: 15 tablet, Refills: 0    vitamin B-12 (CYANOCOBALAMIN) 1000 MCG tablet Take 1 tablet (1,000 mcg total) by mouth every evening. Qty: 30 tablet, Refills: 1    Vitamin D, Ergocalciferol, (DRISDOL) 50000 UNITS CAPS capsule Take 1 capsule (50,000 Units total) by mouth every 14 (fourteen) days. Qty: 4 capsule, Refills: 1    levETIRAcetam (KEPPRA) 500 MG tablet TAKE 1 TABLET TWICE A DAY Qty: 180 tablet, Refills: 1       Allergies  Allergen Reactions  . Latex Other (See Comments)    unknown   Follow-up Information    Follow up with Velna Hatchet, MD.   Specialty:  Internal Medicine   Contact  information:   [address] [place] [address] [phone]        The results of significant diagnostics from this hospitalization (including imaging, microbiology, ancillary and laboratory) are listed below for reference.    Significant Diagnostic Studies: Dg Chest 2 View  12/02/2014   CLINICAL DATA:  Shortness of breath and right-sided weakness. Dyspnea on exertion.  EXAM: CHEST  2 VIEW  COMPARISON:  06/12/2013  FINDINGS: Mild osteopenia. Pacer with leads at right atrium and right ventricle. No lead discontinuity. Midline trachea. Normal heart size. Atherosclerosis in the transverse aorta. No pleural effusion or pneumothorax. No congestive failure. Clear lungs.  IMPRESSION: No acute cardiopulmonary disease.  Aortic atherosclerosis.   Electronically Signed   By: Abigail Miyamoto M.D.   On: 12/02/2014 18:28   Ct Head Wo Contrast  12/03/2014   CLINICAL DATA:  Acute right-sided weakness, resolved.  Dysphagia.  EXAM: CT HEAD WITHOUT CONTRAST  TECHNIQUE: Contiguous axial images were obtained from the base of the skull through the vertex without intravenous contrast.  COMPARISON:  12/02/2014  FINDINGS: Chronic encephalomalacia in the inferior left frontal lobe is unchanged. There is mild ex vacuo dilatation of the left frontal horn.  There is no evidence of acute cortical infarct, intracranial hemorrhage, mass, midline shift, or extra-axial fluid collection. Periventricular white-matter hypodensities are unchanged and nonspecific but compatible with mild chronic small vessel ischemic disease.  Prior bilateral cataract extraction is noted. Mastoid air cells and visualized paranasal sinuses are clear.  IMPRESSION: 1. No evidence of acute intracranial abnormality. 2. Chronic left frontal encephalomalacia.   Electronically Signed   By: Logan Bores   On: 12/03/2014 16:46   Ct Head (brain) Wo Contrast  12/02/2014   CLINICAL DATA:  81 year old female with aphasia and shortness of breath. Code stroke.   EXAM: CT HEAD WITHOUT CONTRAST  TECHNIQUE: Contiguous axial images were obtained from the base of the skull through the vertex without intravenous contrast.  COMPARISON:  Head CT 04/11/2014.  FINDINGS: Well-defined area of low attenuation with overlying cortical thinning in the left frontal region, compatible with encephalomalacia/gliosis related to prior left frontal infarct. This is unchanged. There is also a well-defined focus of low attenuation in the left cerebellar hemisphere which appears new compared to the prior study, but compatible with an area of prior ischemia. Mild cerebral atrophy. Patchy and confluent areas of decreased attenuation are noted throughout the deep and periventricular white matter of the cerebral hemispheres bilaterally, compatible with chronic microvascular ischemic disease. No acute intracranial abnormalities. Specifically, no evidence of acute intracranial hemorrhage, no definite findings of acute/subacute cerebral ischemia, no mass, mass effect, hydrocephalus or abnormal intra or extra-axial fluid collections. Visualized paranasal sinuses and mastoids are well pneumatized. No acute displaced skull fractures are identified.  IMPRESSION: 1. No acute intracranial abnormalities. 2. Chronic ischemic changes, as above, most notable for old left frontal infarction. These results were called by telephone at the time of interpretation on 12/02/2014 at 3:08 pm to Dr. Doy Mince, who verbally acknowledged these results.   Electronically Signed   By: Vinnie Langton M.D.   On: 12/02/2014 15:09    Microbiology: No results found for this or any previous visit (from the past 240 hour(s)).   Labs: Basic Metabolic Panel:  Recent Labs Lab 12/02/14 1453 12/02/14 1502  NA 138 140  K 3.8 3.8  CL 105 106  CO2 24  --   GLUCOSE 94 94  BUN 18 22*  CREATININE 1.16* 1.10*  CALCIUM 9.1  --    Liver Function Tests:  Recent Labs Lab 12/02/14 1453  AST 25  ALT 16  ALKPHOS 69  BILITOT 0.5   PROT 6.6  ALBUMIN 3.5   No results for input(s): LIPASE, AMYLASE in the last 168 hours. No results for input(s): AMMONIA in the last 168 hours. CBC:  Recent Labs Lab 12/02/14 1453 12/02/14 1502  WBC 5.8  --   NEUTROABS 3.8  --   HGB 12.4 13.9  HCT 38.5 41.0  MCV 86.5  --   PLT 144*  --    Cardiac Enzymes: No results for input(s): CKTOTAL, CKMB, CKMBINDEX, TROPONINI in the last 168 hours. BNP: BNP (last 3 results) No results for input(s): BNP in the last 8760 hours.  ProBNP (last 3 results) No results for input(s): PROBNP in the last 8760 hours.  CBG:  Recent Labs Lab 12/02/14 1430  GLUCAP 120*       Signed:  Clarivel Callaway L  [facility] 12/04/2014, 1:55 PM

## 2014-12-04 NOTE — Progress Notes (Signed)
Physical Therapy Treatment Patient Details Name: Ann Kaufman MRN: [id number] DOB: [birth year] Today's Date: 12/04/2014    History of Present Illness Ann Kaufman is a 81 y.o. female with history of atrial fibrillation, previous intracerebral hemorrhage while on Xarelto, hypertension, permanent pacemaker, and hyperlipidemia, presenting with shortness of breath, right facial droop, slurred speech, and subsequent unresponsiveness.    PT Comments    Patient progressing towards PT goals, was able to perform stair negotiation and ambulation without physical assist. Provided education regarding safe technique for stair negotiation. Educated patient on mobility and safety. OF NOTE: no dizziness reported today   Follow Up Recommendations  Outpatient PT (For vestibular rehab)     Equipment Recommendations  None recommended by PT    Recommendations for Other Services       Precautions / Restrictions Precautions Precautions: None Restrictions Weight Bearing Restrictions: No    Mobility  Bed Mobility Overal bed mobility: Modified Independent             General bed mobility comments: increased time to perform  Transfers Overall transfer level: Modified independent Equipment used: Straight cane (with squared base)             General transfer comment: no physical assist or cues required  Ambulation/Gait Ambulation/Gait assistance: Supervision Ambulation Distance (Feet): 320 Feet Assistive device: Straight cane Gait Pattern/deviations: Step-through pattern;Decreased step length - right;Decreased stance time - left;Antalgic Gait velocity: decreased Gait velocity interpretation: Below normal speed for age/gender General Gait Details: some instability noted with ambulation but no significant LOB noted, decreased, VCs for increased cadence. Pt easily distracted, cues to direct to task   Stairs Stairs: Yes Stairs assistance: Supervision Stair Management: One rail  Right;Step to pattern;Forwards Number of Stairs: 6 General stair comments: educated provided regarding sequencing, patient with LLE weakness which creates instability when attempting alternating technique.  Wheelchair Mobility    Modified Rankin (Stroke Patients Only) Modified Rankin (Stroke Patients Only) Pre-Morbid Rankin Score: No symptoms Modified Rankin: Moderately severe disability     Balance     Sitting balance-Leahy Scale: Normal       Standing balance-Leahy Scale: Good                      Cognition Arousal/Alertness: Awake/alert Behavior During Therapy: WFL for tasks assessed/performed Overall Cognitive Status: Within Functional Limits for tasks assessed                      Exercises      General Comments General comments (skin integrity, edema, etc.): no dizziness noted today      Pertinent Vitals/Pain Pain Assessment: No/denies pain    Home Living                      Prior Function            PT Goals (current goals can now be found in the care plan section) Acute Rehab PT Goals Patient Stated Goal: be able to drive again PT Goal Formulation: With patient Time For Goal Achievement: 12/17/14 Potential to Achieve Goals: Good Progress towards PT goals: Progressing toward goals    Frequency  Min 4X/week    PT Plan      Co-evaluation             End of Session Equipment Utilized During Treatment: Gait belt Activity Tolerance: Patient tolerated treatment well Patient left: in bed;with call bell/phone within reach;with bed alarm set  Time: 6803-2122 PT Time Calculation (min) (ACUTE ONLY): 22 min  Charges:  $Gait Training: 8-22 mins                    G Codes:      Duncan Dull 23-Dec-2014, 8:00 AM Alben Deeds, [place] DPT  [phone]

## 2014-12-04 NOTE — Progress Notes (Signed)
EEG completed, results pending. 

## 2014-12-04 NOTE — Evaluation (Signed)
SLP Cancellation Note  Patient Details Name: Ann Kaufman MRN: [id number] DOB: Sep 06, 1929   Cancelled treatment:       Reason Eval/Treat Not Completed:  (premorbid verbal expressive deficits, pt compenates well per pt/daughter, symptoms resolved )  Luanna Salk, [facility] SLP [phone]

## 2014-12-04 NOTE — Progress Notes (Signed)
STROKE TEAM PROGRESS NOTE   HISTORY Ann Kaufman is an 81 y.o. female who presented to the ED today due to shortness of breath. Was in triage getting blood drawn when she slumped over to the right, developed right facial droop and her speech became slurred. Patient then became unresponsive. Code stroke was called at that time. When she came around her speech continued to be slurred. By the time of evaluation after CT her symptoms were improving. Initial NIHSS of 1.   Date last known well: Date: 12/02/2014 Time last known well: Time: 14:29 tPA Given: No: Resolution symptoms   SUBJECTIVE (INTERVAL HISTORY) The patient has gone for EEG study.. Repeat CT scan of the head  Y`day shows no acute abnormality.   OBJECTIVE Temp:  [97.5 F (36.4 C)-98.2 F (36.8 C)] 97.9 F (36.6 C) (06/13 0959) Pulse Rate:  [60-78] 61 (06/13 0959) Cardiac Rhythm:  [-] Atrial paced (06/12 2030) Resp:  [17-18] 18 (06/13 0959) BP: (113-147)/(49-71) 113/49 mmHg (06/13 0959) SpO2:  [97 %-100 %] 100 % (06/13 0959)   Recent Labs Lab 12/02/14 1430  GLUCAP 120*    Recent Labs Lab 12/02/14 1453 12/02/14 1502  NA 138 140  K 3.8 3.8  CL 105 106  CO2 24  --   GLUCOSE 94 94  BUN 18 22*  CREATININE 1.16* 1.10*  CALCIUM 9.1  --     Recent Labs Lab 12/02/14 1453  AST 25  ALT 16  ALKPHOS 69  BILITOT 0.5  PROT 6.6  ALBUMIN 3.5    Recent Labs Lab 12/02/14 1453 12/02/14 1502  WBC 5.8  --   NEUTROABS 3.8  --   HGB 12.4 13.9  HCT 38.5 41.0  MCV 86.5  --   PLT 144*  --    No results for input(s): CKTOTAL, CKMB, CKMBINDEX, TROPONINI in the last 168 hours.  Recent Labs  12/02/14 1453  LABPROT 14.9  INR 1.16   No results for input(s): COLORURINE, LABSPEC, PHURINE, GLUCOSEU, HGBUR, BILIRUBINUR, KETONESUR, PROTEINUR, UROBILINOGEN, NITRITE, LEUKOCYTESUR in the last 72 hours.  Invalid input(s): APPERANCEUR     Component Value Date/Time   CHOL 176 12/03/2014 0555   TRIG 161* 12/03/2014  0555   HDL 59 12/03/2014 0555   CHOLHDL 3.0 12/03/2014 0555   VLDL 32 12/03/2014 0555   LDLCALC 85 12/03/2014 0555   Lab Results  Component Value Date   HGBA1C 6.1* 07/25/2013      Component Value Date/Time   LABOPIA NONE DETECTED 07/24/2013 2017   COCAINSCRNUR NONE DETECTED 07/24/2013 2017   LABBENZ NONE DETECTED 07/24/2013 2017   AMPHETMU NONE DETECTED 07/24/2013 2017   THCU NONE DETECTED 07/24/2013 2017   LABBARB NONE DETECTED 07/24/2013 2017    No results for input(s): ETH in the last 168 hours.  Dg Chest 2 View 12/02/2014    No acute cardiopulmonary disease.  Aortic atherosclerosis.     Ct Head (brain) Wo Contrast 12/02/2014    1. No acute intracranial abnormalities.  2. Chronic ischemic changes, as above, most notable for old left frontal infarction.   12/03/2014 : 1. No evidence of acute intracranial abnormality. 2. Chronic left frontal encephalomalacia  PHYSICAL EXAM Deferred as patient along for EEG  Neurological Exam :  Deferred as patient along for EEG   ASSESSMENT/PLAN Ann Kaufman is a 81 y.o. female with history of atrial fibrillation, previous intracerebral hemorrhage while on Xarelto, hypertension, permanent pacemaker, and hyperlipidemia, presenting with shortness of breath, right facial droop, slurred speech,  and subsequent unresponsiveness. She did not receive IV t-PA due to improvement in deficits.  Stroke / TIA:  Dominant - probably embolic secondary to atrial fibrillation. In the setting of a presyncopal event  Resultant  continued speech difficulties  MRI  not performed secondary to permanent pacemaker  MRA  not performed secondary to permanent pacemaker  Carotid Doppler pending  2D Echo pending  LDL 85  HgbA1c pending  SCDs for VTE prophylaxis Diet Heart Room service appropriate?: Yes; Fluid consistency:: Thin  aspirin 81 mg orally every day prior to admission, now on aspirin 81 mg orally every day  Patient counseled to be  compliant with her antithrombotic medications  Ongoing aggressive stroke risk factor management  Therapy recommendations: Pending  Disposition: Pending  Hypertension  Home meds: Metoprolol  Blood pressure mildly low    Hyperlipidemia  Home meds:  Zocor 10 mg resumed in hospital  LDL 85, goal < 70  Increase Zocor to 20 mg.  Continue statin at discharge     Other Stroke Risk Factors  Advanced age  Former cigarette smoker, quit smoking 46 years ago   ETOH use  Obesity, Body mass index is 29.73 kg/(m^2).   Hx stroke/TIA   Other Active Problems  Renal insufficiency    Other Pertinent History  Previous intracerebral hemorrhage while on Xarelto.     PLAN  Have asked cardiology to interrogate pacemaker.   Check EEG results   Increase aspirin to 325 mg daily  Hospital day # 2     12/04/2014, 1:53 PM I have personally   reviewed notes, independently viewed imaging studies, participated in medical decision making and plan of care. I have made any additions or clarifications directly to the above note. .  She presented with shortness of breath and apparently her syncopal event and was found to have some focal facial weakness and speech difficulties and right-sided weakness which are transient. She does have a remote history of left frontal intracerebral hemorrhage and seizures and remains at risk for recurrent strokes, seizures, TIAs or neurological worsening and needs ongoing evaluation. Recommend change aspirin dose from 81 to 325 mg daily and continue Keppra and the current dose. Check EEG for seizures,  and likely discharge home after results. Ann Contras, MD Medical Director [facility] Pager: [phone] 12/04/2014 1:53 PM     To contact Stroke Continuity provider, please refer to [email or web address] After hours, contact General Neurology

## 2014-12-04 NOTE — Evaluation (Signed)
Occupational Therapy Evaluation Patient Details Name: Ann Kaufman MRN: [id number] DOB: [birth year] Today's Date: 12/04/2014    History of Present Illness [name] is a 81 y.o. female with history of atrial fibrillation, previous intracerebral hemorrhage while on Xarelto, hypertension, permanent pacemaker, and hyperlipidemia, presenting with shortness of breath, right facial droop, slurred speech, and subsequent unresponsiveness.   Clinical Impression   Pt reports feeling back to normal, no dizziness today. Pt was able to complete tub transfer with min guard A, and maintain balance while standing in tub reaching for items. Education provided on safety and fall prevention at home during ADLs. Pt has family available upon d/c to A with ADLs as needed. No further OT needed at this time.    Follow Up Recommendations  No OT follow up;Supervision - Intermittent    Equipment Recommendations       Recommendations for Other Services       Precautions / Restrictions Precautions Precautions: None Restrictions Weight Bearing Restrictions: No      Mobility Bed Mobility               General bed mobility comments: Pt in chair upon arrival  Transfers Overall transfer level: Modified independent Equipment used: None (Pt carried cane but did not use)             General transfer comment: no physical assist or cues required    Balance Overall balance assessment: No apparent balance deficits (not formally assessed) Sitting-balance support: Feet supported       Standing balance support: No upper extremity supported;During functional activity                                ADL Overall ADL's : Needs assistance/impaired Eating/Feeding: Independent;Sitting   Grooming: Supervision/safety;Standing;Wash/dry hands   Upper Body Bathing: Supervision/ safety;Standing   Lower Body Bathing: Supervison/ safety   Upper Body Dressing :  Standing;Supervision/safety   Lower Body Dressing: Sit to/from stand;Supervision/safety   Toilet Transfer: Regular Toilet;Grab bars;Supervision/safety   Toileting- Clothing Manipulation and Hygiene: Independent;Sit to/from stand   Tub/ Shower Transfer: Tub transfer;Min guard;Ambulation Tub/Shower Transfer Details (indicate cue type and reason): Pt reports tub at home is deeper; Education provided on falls prevention at home using bath mat instead of towel. Functional mobility during ADLs: Min guard (Pt carried cane but did not use) General ADL Comments: Education provided on home safety during ADLs     Vision Vision Assessment?: No apparent visual deficits   Perception     Praxis      Pertinent Vitals/Pain Pain Assessment: No/denies pain     Hand Dominance Left   Extremity/Trunk Assessment Upper Extremity Assessment Upper Extremity Assessment: Overall WFL for tasks assessed   Lower Extremity Assessment Lower Extremity Assessment: Overall WFL for tasks assessed       Communication Communication Communication: No difficulties   Cognition Arousal/Alertness: Awake/alert Behavior During Therapy: WFL for tasks assessed/performed Overall Cognitive Status: Within Functional Limits for tasks assessed                     General Comments       Exercises       Shoulder Instructions      Home Living Family/patient expects to be discharged to:: Private residence Living Arrangements: Alone Available Help at Discharge: Family Type of Home: Apartment Home Access: Level entry     Home Layout: One level  Bathroom Shower/Tub: Risk analyst characteristics: Architectural technologist: Standard     Home Equipment: Mining engineer - 4 wheels;Shower seat;Grab bars - tub/shower          Prior Functioning/Environment Level of Independence: Independent             OT Diagnosis: Generalized weakness   OT Problem List: Decreased  strength;Decreased safety awareness;Decreased knowledge of use of DME or AE   OT Treatment/Interventions:      OT Goals(Current goals can be found in the care plan section) Acute Rehab OT Goals Patient Stated Goal: to go home OT Goal Formulation: With patient Time For Goal Achievement: 12/18/14 Potential to Achieve Goals: Good  OT Frequency:     Barriers to D/C:            Co-evaluation              End of Session Equipment Utilized During Treatment: Gait belt Nurse Communication: Mobility status;Precautions  Activity Tolerance: Patient tolerated treatment well Patient left: in chair;with call bell/phone within reach;with chair alarm set;with family/visitor present   Time: 4497-5300 OT Time Calculation (min): 22 min Charges:  OT General Charges $OT Visit: 1 Procedure OT Evaluation $Initial OT Evaluation Tier I: 1 Procedure G-Codes:    Forest Gleason 12/04/2014, 11:58 AM

## 2014-12-04 NOTE — Procedures (Signed)
ELECTROENCEPHALOGRAM REPORT  Patient: Ann Kaufman       Room #: 7N79 EEG No. ID: [id number] Age: 81 y.o.        Sex: female Referring Physician: Remus Blake Report Date:  12/04/2014        Interpreting Physician: Anthony Sar  History: DAUNA ZISKA is an 81 y.o. female admitted following an episode of unresponsiveness followed by right facial droop. She has a history of previous stroke.  Indications for study:  Rule out seizure disorder.  Technique: This is an 18 channel routine scalp EEG performed at the bedside with bipolar and monopolar montages arranged in accordance to the international 10/20 system of electrode placement.   Description: This EEG recording was performed during wakefulness. Predominant activity consisted of 8-9 Hz alpha rhythm with good attenuation with eye opening. Photic stimulation was not performed.  Hyperventilation was not performed. No epileptiform discharges were recorded. There was no abnormal slowing of cerebral activity.  Interpretation: This is a normal EEG recording during wakefulness. No evidence of an epileptic disorder was demonstrated. However, a normal EEG recording in and of itself does not rule out seizure disorder.   Rush Farmer M.D. [facility] [phone]

## 2014-12-08 ENCOUNTER — Other Ambulatory Visit: Payer: Self-pay | Admitting: Family Medicine

## 2014-12-08 DIAGNOSIS — E785 Hyperlipidemia, unspecified: Secondary | ICD-10-CM | POA: Insufficient documentation

## 2014-12-15 ENCOUNTER — Emergency Department (HOSPITAL_COMMUNITY)
Admission: EM | Admit: 2014-12-15 | Discharge: 2014-12-15 | Disposition: A | Discharge status: Home / Self Care | Payer: Medicare Other | Attending: Emergency Medicine | Admitting: Emergency Medicine

## 2014-12-15 ENCOUNTER — Encounter (HOSPITAL_COMMUNITY): Payer: Self-pay | Admitting: Emergency Medicine

## 2014-12-15 DIAGNOSIS — E785 Hyperlipidemia, unspecified: Secondary | ICD-10-CM | POA: Diagnosis not present

## 2014-12-15 DIAGNOSIS — W010XXA Fall on same level from slipping, tripping and stumbling without subsequent striking against object, initial encounter: Secondary | ICD-10-CM | POA: Insufficient documentation

## 2014-12-15 DIAGNOSIS — M1990 Unspecified osteoarthritis, unspecified site: Secondary | ICD-10-CM | POA: Diagnosis not present

## 2014-12-15 DIAGNOSIS — Y939 Activity, unspecified: Secondary | ICD-10-CM | POA: Diagnosis not present

## 2014-12-15 DIAGNOSIS — Z79899 Other long term (current) drug therapy: Secondary | ICD-10-CM | POA: Diagnosis not present

## 2014-12-15 DIAGNOSIS — Y92009 Unspecified place in unspecified non-institutional (private) residence as the place of occurrence of the external cause: Secondary | ICD-10-CM | POA: Diagnosis not present

## 2014-12-15 DIAGNOSIS — Z91040 Latex allergy status: Secondary | ICD-10-CM | POA: Insufficient documentation

## 2014-12-15 DIAGNOSIS — I10 Essential (primary) hypertension: Secondary | ICD-10-CM | POA: Insufficient documentation

## 2014-12-15 DIAGNOSIS — I4891 Unspecified atrial fibrillation: Secondary | ICD-10-CM | POA: Insufficient documentation

## 2014-12-15 DIAGNOSIS — S50311A Abrasion of right elbow, initial encounter: Secondary | ICD-10-CM | POA: Insufficient documentation

## 2014-12-15 DIAGNOSIS — Z87891 Personal history of nicotine dependence: Secondary | ICD-10-CM | POA: Insufficient documentation

## 2014-12-15 DIAGNOSIS — Z950 Presence of cardiac pacemaker: Secondary | ICD-10-CM | POA: Diagnosis not present

## 2014-12-15 DIAGNOSIS — S0990XA Unspecified injury of head, initial encounter: Secondary | ICD-10-CM | POA: Diagnosis not present

## 2014-12-15 DIAGNOSIS — M069 Rheumatoid arthritis, unspecified: Secondary | ICD-10-CM | POA: Diagnosis not present

## 2014-12-15 DIAGNOSIS — F329 Major depressive disorder, single episode, unspecified: Secondary | ICD-10-CM | POA: Insufficient documentation

## 2014-12-15 DIAGNOSIS — R51 Headache: Secondary | ICD-10-CM | POA: Diagnosis not present

## 2014-12-15 DIAGNOSIS — W19XXXA Unspecified fall, initial encounter: Secondary | ICD-10-CM

## 2014-12-15 DIAGNOSIS — Y999 Unspecified external cause status: Secondary | ICD-10-CM | POA: Insufficient documentation

## 2014-12-15 NOTE — ED Provider Notes (Signed)
CSN: [id number]     Arrival date & time 12/15/14  2001 History   First MD Initiated Contact with Patient 12/15/14 2034     Chief Complaint  Patient presents with  . Fall  . Head Injury   Patient is status post accidental fall at home she tripped over the curb. No head or neck pain. She complains of an abrasion to her right elbow. No extremity or truncal discomfort. She is ambulatory. No prodromal illnesses. Severity is mild to moderate.  (Consider location/radiation/quality/duration/timing/severity/associated sxs/prior Treatment) Patient is a 81 y.o. female presenting with fall and head injury.  Fall  Head Injury   Past Medical History  Diagnosis Date  . Intracerebral hemorrhage     a. 04/2013 in setting of xarelto therapy.  . Atrial fibrillation 2012    a. Dx in 2012;  b. Rhythm controlled - amiodarone, previously anticoagulated with xarelto (d/c'd 04/2013 in setting of [place]);  b. 04/2013 Echo: EF 55-60%, Gr 1 DD, mild MR, mod dil LA, PAsP 10mmhg  . Hypertension   . Depression   . Arthritis   . Presence of permanent cardiac pacemaker     a. 2012 MDT, placed in [place] (probable tachy-brady).  . Pacemaker   . SSS (sick sinus syndrome)     s/p dual chamber pacemaker  . PAF (paroxysmal atrial fibrillation)   . Aphasia     Occasional aphasia & facial numbness from a left frontal lobe intercerebral hemorrhage on Xarelto on 05/18/13.  Marland Kitchen Pericardial effusion   . Mitral valve prolapse   . Hyperlipidemia   . Rheumatoid arthritis   . SOB (shortness of breath)     Occasional SOB  . Palpitations     Occasional.  . Peripheral edema     Because she has been standing on her feet more than usual   . Headache    Past Surgical History  Procedure Laterality Date  . Pacemaker insertion      MDT implanted at [facility]  . Abdominal hysterectomy    . Breast lumpectomy    . Appendectomy    . Bowel resection    . Lead revision  09/17/11    s/p atrial lead revision   Family History   Problem Relation Age of Onset  . Other      negative for premature CAD.  Marland Kitchen Aneurysm Father    History  Substance Use Topics  . Smoking status: Former Smoker    Quit date: 11/26/1968  . Smokeless tobacco: Not on file  . Alcohol Use: Yes   OB History    No data available     Review of Systems  All other systems reviewed and are negative.     Allergies  Latex  Home Medications   Prior to Admission medications   Medication Sig Start Date End Date Taking? Authorizing Provider  aspirin 325 MG EC tablet Take 325 mg by mouth every 6 (six) hours as needed (for headaches).    Yes Historical Provider, MD  famotidine (PEPCID) 20 MG tablet Take 1 tablet (20 mg total) by mouth 2 (two) times daily. Patient taking differently: Take 20 mg by mouth every other day.  06/08/13  Yes Ivan Anchors Love, PA-C  folic acid (FOLVITE) 295 MCG tablet Take 400 mcg by mouth every evening.   Yes Historical Provider, MD  furosemide (LASIX) 40 MG tablet Take 40 mg by mouth daily.   Yes Historical Provider, MD  levETIRAcetam (KEPPRA) 500 MG tablet TAKE 1 TABLET TWICE A DAY  Patient taking differently: TAKE 500 MG BY MOUTH TWICE DAILY 12/03/14  Yes Garvin Fila, MD  metoprolol tartrate (LOPRESSOR) 25 MG tablet Take 1 tablet (25 mg total) by mouth 2 (two) times daily. 06/08/13  Yes Ivan Anchors Love, PA-C  omeprazole (PRILOSEC) 20 MG capsule Take 1 capsule (20 mg total) by mouth every morning. Patient taking differently: Take 20 mg by mouth as needed (for heartburn).  06/08/13  Yes Pamela S Love, PA-C  PACERONE 200 MG tablet TAKE 1 TABLET EVERY MORNING Patient taking differently: TAKE 20 MG BY MOUTH DAILY 10/02/14  Yes Thompson Grayer, MD  potassium chloride SA (K-DUR,KLOR-CON) 20 MEQ tablet Take 1 tablet (20 mEq total) by mouth daily. 03/20/14  Yes Meredith Staggers, MD  senna (SENOKOT) 8.6 MG TABS tablet Take 2 tablets (17.2 mg total) by mouth at bedtime. 06/08/13  Yes Ivan Anchors Love, PA-C  sertraline (ZOLOFT) 50 MG  tablet TAKE 1 TABLET AT BEDTIME Patient taking differently: TAKE 50 MG BY MOUTH AT BEDTIME 07/21/14  Yes Garvin Fila, MD  simvastatin (ZOCOR) 10 MG tablet TAKE 1 TABLET AT BEDTIME (NEEDS OFFICE VISIT) Patient taking differently: TAKE 10 MG BY MOUTH AT BEDTIME 12/08/14  Yes Margarita Rana, MD  vitamin B-12 (CYANOCOBALAMIN) 1000 MCG tablet Take 1 tablet (1,000 mcg total) by mouth every evening. 06/08/13  Yes Ivan Anchors Love, PA-C  Vitamin D, Ergocalciferol, (DRISDOL) 50000 UNITS CAPS capsule Take 1 capsule (50,000 Units total) by mouth every 14 (fourteen) days. 06/08/13  Yes Pamela S Love, PA-C   BP 132/53 mmHg  Pulse 60  Temp(Src) 97.4 F (36.3 C) (Oral)  Resp 18  SpO2 98% Physical Exam  Constitutional: She is oriented to person, place, and time. She appears well-developed and well-nourished.  No acute distress  HENT:  Head: Normocephalic and atraumatic.  No head trauma  Eyes: Conjunctivae and EOM are normal. Pupils are equal, round, and reactive to light.  Neck: Normal range of motion. Neck supple.  No neck tenderness  Cardiovascular: Normal rate and regular rhythm.   Pulmonary/Chest: Effort normal and breath sounds normal.  Abdominal: Soft. Bowel sounds are normal.  Musculoskeletal:  Right elbow: 1.5 x 4 cm abrasion  Neurological: She is alert and oriented to person, place, and time.  Skin: Skin is warm and dry.  Psychiatric: She has a normal mood and affect. Her behavior is normal.  Nursing note and vitals reviewed.   ED Course  Procedures (including critical care time) Labs Review Labs Reviewed - No data to display  Imaging Review No results found.   EKG Interpretation None      MDM   Final diagnoses:  Fall, initial encounter  Elbow abrasion, right, initial encounter    No neurological deficits. Patient is alert and oriented 3. No evidence of head or neck trauma. Right elbow has full range of motion. No imaging necessary. Patient observed for 2 hours with no  change in her exam.    Nat Christen, MD 12/15/14 2242

## 2014-12-15 NOTE — ED Notes (Signed)
Brought in by EMS from home with c/o head injury after her fall tonight.  Pt reports that she was "just talking to her neighbor, got over-heated' and fell backwards, hitting head and right arm to the ground".  Pt denies loss of consciousness nor dizziness.  Pt arrived to ED A/Ox4, denies pain, on c-collar and in no s/s apparent distress.

## 2014-12-15 NOTE — Progress Notes (Signed)
CSW met with pt at bedside. Daughter was present. Patient confirms that she comes from home. She states that she lives alone in [place]. Daughter states that she is the patient's primary support and visits her at home several times a week.   Patient informed CSW that she presents to [facility] tonight due to falling. Patient stated " I stepped on the edge of a curb that was round instead of flat." Patient says that she fell while talking to some neighbors. Patient's right elbow appears to be injured.  Patient informed CSW that prior to incident she has been able to complete her ADL's independently. She states that she has fallen x2 in the past 6 months.  Karen/Daughter [phone]  Willette Brace [phone] ED CSW 12/15/2014 9:15 PM

## 2014-12-15 NOTE — Discharge Instructions (Signed)
Simple daily dressing to right elbow abrasion. Can use soap and water and Neosporin ointment.

## 2014-12-15 NOTE — ED Notes (Signed)
Bed: WA06 Expected date:  Expected time:  Means of arrival:  Comments: EMS elderly fall 

## 2014-12-18 ENCOUNTER — Other Ambulatory Visit: Payer: Self-pay

## 2014-12-30 ENCOUNTER — Other Ambulatory Visit: Payer: Self-pay | Admitting: Internal Medicine

## 2015-01-24 ENCOUNTER — Encounter: Payer: Self-pay | Admitting: Internal Medicine

## 2015-01-24 ENCOUNTER — Ambulatory Visit (INDEPENDENT_AMBULATORY_CARE_PROVIDER_SITE_OTHER): Payer: Medicare Other | Admitting: Internal Medicine

## 2015-01-24 VITALS — BP 110/60 | HR 72 | Ht 64.0 in | Wt 174.0 lb

## 2015-01-24 DIAGNOSIS — I495 Sick sinus syndrome: Secondary | ICD-10-CM | POA: Diagnosis not present

## 2015-01-24 DIAGNOSIS — R0602 Shortness of breath: Secondary | ICD-10-CM | POA: Diagnosis not present

## 2015-01-24 DIAGNOSIS — I482 Chronic atrial fibrillation, unspecified: Secondary | ICD-10-CM

## 2015-01-24 DIAGNOSIS — Z950 Presence of cardiac pacemaker: Secondary | ICD-10-CM | POA: Diagnosis not present

## 2015-01-24 DIAGNOSIS — I10 Essential (primary) hypertension: Secondary | ICD-10-CM

## 2015-01-24 DIAGNOSIS — I639 Cerebral infarction, unspecified: Secondary | ICD-10-CM

## 2015-01-24 LAB — CUP PACEART INCLINIC DEVICE CHECK
Battery Remaining Longevity: 113 mo
Battery Voltage: 2.8 V
Brady Statistic AP VP Percent: 0 %
Brady Statistic AP VS Percent: 100 %
Brady Statistic AS VP Percent: 0 %
Date Time Interrogation Session: 20160803131807
Lead Channel Impedance Value: 437 Ohm
Lead Channel Impedance Value: 508 Ohm
Lead Channel Pacing Threshold Amplitude: 0.5 V
Lead Channel Pacing Threshold Pulse Width: 0.4 ms
Lead Channel Sensing Intrinsic Amplitude: 4 mV
Lead Channel Setting Pacing Amplitude: 1.5 V
Lead Channel Setting Pacing Pulse Width: 0.46 ms
MDC IDC MSMT BATTERY IMPEDANCE: 280 Ohm
MDC IDC MSMT LEADCHNL RV PACING THRESHOLD AMPLITUDE: 1.5 V
MDC IDC MSMT LEADCHNL RV PACING THRESHOLD PULSEWIDTH: 0.46 ms
MDC IDC SET LEADCHNL RV PACING AMPLITUDE: 2.75 V
MDC IDC SET LEADCHNL RV SENSING SENSITIVITY: 2 mV
MDC IDC STAT BRADY AS VS PERCENT: 0 %

## 2015-01-24 MED ORDER — AMIODARONE HCL 200 MG PO TABS
100.0000 mg | ORAL_TABLET | ORAL | Status: DC
Start: 2015-01-24 — End: 2015-05-23

## 2015-01-24 NOTE — Progress Notes (Signed)
Velna Hatchet, MD: PCP  Ann Kaufman is a 81 y.o. female with a h/o sinus bradycardia sp PPM (MDT) by Dr Saralyn Pilar who presents today to follow-up in the Electrophysiology device clinic.  She has a history of atrial fibrillation maintained on Amiodarone.  She is not currently on anticoagulation due to history of intracranial hemorrhage.   She had a recent TIA though pacemaker interrogation did not reveal afib.  She has some SOB with ADLs.  + photosensitivity.  The patient reports doing very well since having a pacemaker implanted and remains very active despite her age.   Today, she  denies symptoms of palpitations, chest pain,  , orthopnea, PND, lower extremity edema, dizziness, presyncope, syncope, or neurologic sequela.  The patientis tolerating medications without difficulties and is otherwise without complaint today.   Past Medical History  Diagnosis Date  . Intracerebral hemorrhage     a. 04/2013 in setting of xarelto therapy.  . Atrial fibrillation 2012    a. Dx in 2012;  b. Rhythm controlled - amiodarone, previously anticoagulated with xarelto (d/c'd 04/2013 in setting of [place]);  b. 04/2013 Echo: EF 55-60%, Gr 1 DD, mild MR, mod dil LA, PAsP 75mmhg  . Hypertension   . Depression   . Arthritis   . Presence of permanent cardiac pacemaker     a. 2012 MDT, placed in [place] (probable tachy-brady).  . Pacemaker   . SSS (sick sinus syndrome)     s/p dual chamber pacemaker  . PAF (paroxysmal atrial fibrillation)   . Aphasia     Occasional aphasia & facial numbness from a left frontal lobe intercerebral hemorrhage on Xarelto on 05/18/13.  Marland Kitchen Pericardial effusion   . Mitral valve prolapse   . Hyperlipidemia   . Rheumatoid arthritis   . SOB (shortness of breath)     Occasional SOB  . Palpitations     Occasional.  . Peripheral edema     Because she has been standing on her feet more than usual   . Headache    Past Surgical History  Procedure Laterality Date  . Pacemaker  insertion      MDT implanted at [facility]  . Abdominal hysterectomy    . Breast lumpectomy    . Appendectomy    . Bowel resection    . Lead revision  09/17/11    s/p atrial lead revision    History   Social History  . Marital Status: Married    Spouse Name: Eddie Dibbles  . Number of Children: 3  . Years of Education: 12   Occupational History  . retired    Social History Main Topics  . Smoking status: Former Smoker    Quit date: 11/26/1968  . Smokeless tobacco: Not on file  . Alcohol Use: Yes  . Drug Use: No  . Sexual Activity: Not on file   Other Topics Concern  . Not on file   Social History Narrative   Lives in [place] by herself.  Daughter lives in [place].    Family History  Problem Relation Age of Onset  . Other      negative for premature CAD.  Marland Kitchen Aneurysm Father     Allergies  Allergen Reactions  . Latex Other (See Comments)    Reaction: unknown    Current Outpatient Prescriptions  Medication Sig Dispense Refill  . amiodarone (PACERONE) 200 MG tablet Take 200 mg by mouth every morning.    Marland Kitchen aspirin 325 MG EC tablet Take 325 mg  by mouth every 6 (six) hours as needed (for headaches).     . famotidine (PEPCID) 20 MG tablet Take 1 tablet (20 mg total) by mouth 2 (two) times daily. (Patient taking differently: Take 20 mg by mouth every other day. )    . folic acid (FOLVITE) 858 MCG tablet Take 400 mcg by mouth every evening.    . furosemide (LASIX) 40 MG tablet Take 40 mg by mouth daily.    Marland Kitchen levETIRAcetam (KEPPRA) 500 MG tablet TAKE 1 TABLET TWICE A DAY (Patient taking differently: TAKE 500 MG BY MOUTH TWICE DAILY) 180 tablet 1  . metoprolol tartrate (LOPRESSOR) 25 MG tablet Take 1 tablet (25 mg total) by mouth 2 (two) times daily. 60 tablet 1  . omeprazole (PRILOSEC) 20 MG capsule Take 1 capsule (20 mg total) by mouth every morning. (Patient taking differently: Take 20 mg by mouth every morning. ) 30 capsule 1  . potassium chloride SA (K-DUR,KLOR-CON) 20 MEQ tablet  Take 1 tablet (20 mEq total) by mouth daily. 30 tablet 3  . senna (SENOKOT) 8.6 MG TABS tablet Take 2 tablets (17.2 mg total) by mouth at bedtime. 120 each 0  . sertraline (ZOLOFT) 50 MG tablet TAKE 1 TABLET AT BEDTIME (Patient taking differently: TAKE 50 MG BY MOUTH AT BEDTIME) 90 tablet 3  . simvastatin (ZOCOR) 10 MG tablet TAKE 1 TABLET AT BEDTIME (NEEDS OFFICE VISIT) (Patient taking differently: TAKE 10 MG BY MOUTH AT BEDTIME) 90 tablet 3  . vitamin B-12 (CYANOCOBALAMIN) 1000 MCG tablet Take 1 tablet (1,000 mcg total) by mouth every evening. 30 tablet 1  . Vitamin D, Ergocalciferol, (DRISDOL) 50000 UNITS CAPS capsule Take 1 capsule (50,000 Units total) by mouth every 14 (fourteen) days. 4 capsule 1   No current facility-administered medications for this visit.    ROS- all systems are reviewed and negative except as per HPI  Physical Exam: Filed Vitals:   01/24/15 1016  BP: 110/60  Pulse: 72  Height: 5\' 4"  (1.626 m)  Weight: 78.926 kg (174 lb)    GEN- The patient is elderly appearing, alert and oriented x 3 today.   Head- normocephalic, atraumatic Eyes-  Sclera clear, conjunctiva pink Ears- hearing intact Oropharynx- clear Neck- supple  Lungs- Clear to ausculation bilaterally, normal work of breathing Chest- pacemaker pocket is well healed Heart- Regular rate and rhythm, no murmurs, rubs or gallops, PMI not laterally displaced GI- soft, NT, ND, + BS Extremities- no clubbing, cyanosis, or edema MS- no significant deformity or atrophy Skin- no rash or lesion Psych- euthymic mood, full affect Neuro- strength and sensation are intact  Pacemaker interrogation- reviewed in detail today,  See PACEART report  Assessment and Plan:  1. Sick sinus syndrome Normal pacemaker function See Claudia Desanctis Art report Today we reduced her activity threshold with RR and did hall walk which showed appropriate resopnse  2. Atrial fibrillation Maintaining sinus rhythm with amiodarone Not a  candidate for anticoagulation due to prior ICH on xarelto No afib in over a year Reduce amiodarone to 100mg  daily pcp to follow LFTs/TFTs Check PFTs given SOB  3. HTN Stable No change required today  carelink I will see in a year

## 2015-01-24 NOTE — Patient Instructions (Signed)
Medication Instructions:  Your physician has recommended you make the following change in your medication:  1) Decrease Amiodarone to 100 mg daily   Labwork: None ordered  Testing/Procedures: Your physician has recommended that you have a pulmonary function test. Pulmonary Function Tests are a group of tests that measure how well air moves in and out of your lungs.    Follow-Up: Your physician wants you to follow-up in: 12 months with Dr Vallery Ridge will receive a reminder letter in the mail two months in advance. If you don't receive a letter, please call our office to schedule the follow-up appointment.  Remote monitoring is used to monitor your Pacemaker  from home. This monitoring reduces the number of office visits required to check your device to one time per year. It allows [place] to keep an eye on the functioning of your device to ensure it is working properly. You are scheduled for a device check from home on 04/25/15. You may send your transmission at any time that day. If you have a wireless device, the transmission will be sent automatically. After your physician reviews your transmission, you will receive a postcard with your next transmission date.    Any Other Special Instructions Will Be Listed Below (If Applicable).

## 2015-03-14 ENCOUNTER — Emergency Department (HOSPITAL_COMMUNITY)
Admission: EM | Admit: 2015-03-14 | Discharge: 2015-03-14 | Disposition: A | Discharge status: Home / Self Care | Payer: Medicare Other | Attending: Emergency Medicine | Admitting: Emergency Medicine

## 2015-03-14 ENCOUNTER — Encounter (HOSPITAL_COMMUNITY): Payer: Self-pay | Admitting: Emergency Medicine

## 2015-03-14 ENCOUNTER — Emergency Department (HOSPITAL_COMMUNITY): Payer: Medicare Other

## 2015-03-14 DIAGNOSIS — I10 Essential (primary) hypertension: Secondary | ICD-10-CM | POA: Insufficient documentation

## 2015-03-14 DIAGNOSIS — M1990 Unspecified osteoarthritis, unspecified site: Secondary | ICD-10-CM | POA: Diagnosis not present

## 2015-03-14 DIAGNOSIS — Z7982 Long term (current) use of aspirin: Secondary | ICD-10-CM | POA: Diagnosis not present

## 2015-03-14 DIAGNOSIS — R0602 Shortness of breath: Secondary | ICD-10-CM

## 2015-03-14 DIAGNOSIS — Z79899 Other long term (current) drug therapy: Secondary | ICD-10-CM | POA: Diagnosis not present

## 2015-03-14 DIAGNOSIS — Z91040 Latex allergy status: Secondary | ICD-10-CM | POA: Diagnosis not present

## 2015-03-14 DIAGNOSIS — Z87891 Personal history of nicotine dependence: Secondary | ICD-10-CM | POA: Insufficient documentation

## 2015-03-14 DIAGNOSIS — Z950 Presence of cardiac pacemaker: Secondary | ICD-10-CM | POA: Diagnosis not present

## 2015-03-14 DIAGNOSIS — R42 Dizziness and giddiness: Secondary | ICD-10-CM | POA: Diagnosis not present

## 2015-03-14 DIAGNOSIS — E785 Hyperlipidemia, unspecified: Secondary | ICD-10-CM | POA: Diagnosis not present

## 2015-03-14 LAB — PULMONARY FUNCTION TEST
FEF 25-75 Pre: 1.01 L/sec
FEF2575-%PRED-PRE: 79 %
FEV1-%Pred-Pre: 81 %
FEV1-Pre: 1.49 L
FEV1FVC-%PRED-PRE: 96 %
FEV6-%Pred-Pre: 89 %
FEV6-PRE: 2.1 L
FEV6FVC-%PRED-PRE: 106 %
FVC-%Pred-Pre: 85 %
FVC-PRE: 2.13 L
Pre FEV1/FVC ratio: 70 %
Pre FEV6/FVC Ratio: 100 %

## 2015-03-14 LAB — CBC WITH DIFFERENTIAL/PLATELET
Basophils Absolute: 0 10*3/uL (ref 0.0–0.1)
Basophils Relative: 1 %
Eosinophils Absolute: 0.1 10*3/uL (ref 0.0–0.7)
Eosinophils Relative: 2 %
HCT: 36.6 % (ref 36.0–46.0)
Hemoglobin: 12 g/dL (ref 12.0–15.0)
Lymphocytes Relative: 25 %
Lymphs Abs: 1.3 10*3/uL (ref 0.7–4.0)
MCH: 28.3 pg (ref 26.0–34.0)
MCHC: 32.8 g/dL (ref 30.0–36.0)
MCV: 86.3 fL (ref 78.0–100.0)
MONOS PCT: 9 %
Monocytes Absolute: 0.5 10*3/uL (ref 0.1–1.0)
Neutro Abs: 3.3 10*3/uL (ref 1.7–7.7)
Neutrophils Relative %: 63 %
PLATELETS: 123 10*3/uL — AB (ref 150–400)
RBC: 4.24 MIL/uL (ref 3.87–5.11)
RDW: 14.5 % (ref 11.5–15.5)
WBC: 5.2 10*3/uL (ref 4.0–10.5)

## 2015-03-14 LAB — BASIC METABOLIC PANEL
Anion gap: 10 (ref 5–15)
BUN: 23 mg/dL — ABNORMAL HIGH (ref 6–20)
CALCIUM: 9 mg/dL (ref 8.9–10.3)
CO2: 24 mmol/L (ref 22–32)
Chloride: 104 mmol/L (ref 101–111)
Creatinine, Ser: 1.15 mg/dL — ABNORMAL HIGH (ref 0.44–1.00)
GFR calc non Af Amer: 43 mL/min — ABNORMAL LOW (ref >60)
GFR, EST AFRICAN AMERICAN: 50 mL/min — AB (ref >60)
Glucose, Bld: 91 mg/dL (ref 65–99)
Potassium: 4.2 mmol/L (ref 3.5–5.1)
Sodium: 138 mmol/L (ref 135–145)

## 2015-03-14 LAB — I-STAT TROPONIN, ED: TROPONIN I, POC: 0 ng/mL (ref 0.00–0.08)

## 2015-03-14 LAB — BRAIN NATRIURETIC PEPTIDE: B Natriuretic Peptide: 197.2 pg/mL — ABNORMAL HIGH (ref 0.0–100.0)

## 2015-03-14 MED ORDER — SODIUM CHLORIDE 0.9 % IV BOLUS (SEPSIS): 500.0000 mL | Freq: Once | INTRAVENOUS | Status: DC

## 2015-03-14 NOTE — ED Notes (Signed)
Pt ambulated to restroom with stand by assist, but was unable to obtain urine specimen.

## 2015-03-14 NOTE — ED Notes (Signed)
Pt here from [facility] via ems. Pt was getting PFTs when she became acutely SOB with some chest tightness and nausea. EMS reports that the pt was pale upon arrival but that all of her symptoms have resolved. Pt denies any symptoms at \\present .

## 2015-03-14 NOTE — ED Notes (Signed)
[employer] rep Tomi Bamberger responded to page, stated that she would be here by 2230 to interrogate device. EDP aware. Pt updated.

## 2015-03-14 NOTE — ED Provider Notes (Signed)
CSN: [id number]     Arrival date & time 03/14/15  1729 History   First MD Initiated Contact with Patient 03/14/15 1734     Chief Complaint  Patient presents with  . Shortness of Breath     (Consider location/radiation/quality/duration/timing/severity/associated sxs/prior Treatment) HPI   Ann Kaufman is a 81 y.o. female with PMH significant for atrial fibrillation, sick sinus syndrome s/p pacemaker (2012), CVA (2014), hypertension, HLD who presents with sudden 15 minute period of lightheadedness/dizziness with accompanied diaphoresis, pallor, SOB, and nausea while she was performing pulmonary function tests at the pulmonology clinic today around 3 PM. She states that she did receive a "shock" from her pacemaker. Denies chest pain, cough, fevers, chills, vomiting, abdominal pain, urinary symptoms, or vertigo. Patient states she had not had anything to eat or drink since breakfast. She also states that she saw her cardiologist one month ago and they adjusted her pacemaker as well as decreased her dose of amiodarone. Patient is at her baseline at this time with no symptoms.   Past Medical History  Diagnosis Date  . Intracerebral hemorrhage     a. 04/2013 in setting of xarelto therapy.  . Atrial fibrillation 2012    a. Dx in 2012;  b. Rhythm controlled - amiodarone, previously anticoagulated with xarelto (d/c'd 04/2013 in setting of [place]);  b. 04/2013 Echo: EF 55-60%, Gr 1 DD, mild MR, mod dil LA, PAsP 33mmhg  . Hypertension   . Depression   . Arthritis   . Presence of permanent cardiac pacemaker     a. 2012 MDT, placed in [place] (probable tachy-brady).  . Pacemaker   . SSS (sick sinus syndrome)     s/p dual chamber pacemaker  . PAF (paroxysmal atrial fibrillation)   . Aphasia     Occasional aphasia & facial numbness from a left frontal lobe intercerebral hemorrhage on Xarelto on 05/18/13.  Marland Kitchen Pericardial effusion   . Mitral valve prolapse   . Hyperlipidemia   . Rheumatoid arthritis    . SOB (shortness of breath)     Occasional SOB  . Palpitations     Occasional.  . Peripheral edema     Because she has been standing on her feet more than usual   . Headache    Past Surgical History  Procedure Laterality Date  . Pacemaker insertion      MDT [facility] at [facility]  . Abdominal hysterectomy    . Breast lumpectomy    . Appendectomy    . Bowel resection    . Lead revision  09/17/11    s/p atrial lead revision   Family History  Problem Relation Age of Onset  . Other      negative for premature CAD.  Marland Kitchen Aneurysm Father    Social History  Substance Use Topics  . Smoking status: Former Smoker    Quit date: 11/26/1968  . Smokeless tobacco: None  . Alcohol Use: Yes   OB History    No data available     Review of Systems  All other systems negative unless otherwise stated in HPI   Allergies  Latex  Home Medications   Prior to Admission medications   Medication Sig Start Date End Date Taking? Authorizing Provider  amiodarone (PACERONE) 200 MG tablet Take 0.5 tablets (100 mg total) by mouth every morning. 01/24/15   Thompson Grayer, MD  aspirin 325 MG EC tablet Take 325 mg by mouth every 6 (six) hours as needed (for headaches).  Historical Provider, MD  famotidine (PEPCID) 20 MG tablet Take 1 tablet (20 mg total) by mouth 2 (two) times daily. Patient taking differently: Take 20 mg by mouth every other day.  06/08/13   Bary Leriche, PA-C  folic acid (FOLVITE) 174 MCG tablet Take 400 mcg by mouth every evening.    Historical Provider, MD  furosemide (LASIX) 40 MG tablet Take 40 mg by mouth daily.    Historical Provider, MD  levETIRAcetam (KEPPRA) 500 MG tablet TAKE 1 TABLET TWICE A DAY Patient taking differently: TAKE 500 MG BY MOUTH TWICE DAILY 12/03/14   Garvin Fila, MD  metoprolol tartrate (LOPRESSOR) 25 MG tablet Take 1 tablet (25 mg total) by mouth 2 (two) times daily. 06/08/13   Bary Leriche, PA-C  omeprazole (PRILOSEC) 20 MG capsule Take 1 capsule (20  mg total) by mouth every morning. Patient taking differently: Take 20 mg by mouth every morning.  06/08/13   Ivan Anchors Love, PA-C  potassium chloride SA (K-DUR,KLOR-CON) 20 MEQ tablet Take 1 tablet (20 mEq total) by mouth daily. 03/20/14   Meredith Staggers, MD  senna (SENOKOT) 8.6 MG TABS tablet Take 2 tablets (17.2 mg total) by mouth at bedtime. 06/08/13   Bary Leriche, PA-C  sertraline (ZOLOFT) 50 MG tablet TAKE 1 TABLET AT BEDTIME Patient taking differently: TAKE 50 MG BY MOUTH AT BEDTIME 07/21/14   Garvin Fila, MD  simvastatin (ZOCOR) 10 MG tablet TAKE 1 TABLET AT BEDTIME (NEEDS OFFICE VISIT) Patient taking differently: TAKE 10 MG BY MOUTH AT BEDTIME 12/08/14   Margarita Rana, MD  vitamin B-12 (CYANOCOBALAMIN) 1000 MCG tablet Take 1 tablet (1,000 mcg total) by mouth every evening. 06/08/13   Bary Leriche, PA-C  Vitamin D, Ergocalciferol, (DRISDOL) 50000 UNITS CAPS capsule Take 1 capsule (50,000 Units total) by mouth every 14 (fourteen) days. 06/08/13   Ivan Anchors Love, PA-C   BP 136/80 mmHg  Pulse 66  Temp(Src) 97.7 F (36.5 C) (Oral)  Resp 12  Ht 5\' 4"  (1.626 m)  Wt 171 lb (77.565 kg)  BMI 29.34 kg/m2  SpO2 100% Physical Exam  Constitutional: She is oriented to person, place, and time. She appears well-developed and well-nourished.  HENT:  Head: Normocephalic and atraumatic.  Mouth/Throat: Oropharynx is clear and moist.  Eyes: Pupils are equal, round, and reactive to light.  Neck: Normal range of motion. Neck supple.  Cardiovascular: Normal rate, regular rhythm and normal heart sounds.   No murmur heard. Capillary refill less than 3 seconds.   Pulmonary/Chest: Effort normal and breath sounds normal. No respiratory distress. She has no wheezes. She has no rales.  Abdominal: Soft. Bowel sounds are normal. She exhibits no distension. There is no tenderness.  Musculoskeletal: Normal range of motion.  Lymphadenopathy:    She has no cervical adenopathy.  Neurological: She is alert and  oriented to person, place, and time.  Mental Status:   AOx3 Cranial Nerves:  I-not tested  II-visual acuity grossly intact, PERRLA  III, IV, VI-EOMs intact  V-temporal and masseter strength intact  VII-symmetrical facial movements intact, no facial droop  VIII-hearing grossly intact bilaterally  IX, X-gag intact  XI-strength of sternomastoid and trapezius muscles 5/5  XII-tongue midline Motor:   Good muscle bulk and tone  Strength 5/5 bilaterally in upper and lower  extremities (R>L, per usual since stroke 2014).  Cerebellar--RAMs, finger to nose intact  Casual and tandem gait normal without ataxia  No pronator drift Sensory:   intact  Skin: Skin is warm and dry. She is not diaphoretic. No pallor.  Psychiatric: She has a normal mood and affect. Her behavior is normal.    ED Course  Procedures (including critical care time) Labs Review Labs Reviewed  BRAIN NATRIURETIC PEPTIDE - Abnormal; Notable for the following:    B Natriuretic Peptide 197.2 (*)    All other components within normal limits  CBC WITH DIFFERENTIAL/PLATELET - Abnormal; Notable for the following:    Platelets 123 (*)    All other components within normal limits  BASIC METABOLIC PANEL - Abnormal; Notable for the following:    BUN 23 (*)    Creatinine, Ser 1.15 (*)    GFR calc non Af Amer 43 (*)    GFR calc Af Amer 50 (*)    All other components within normal limits  Randolm Idol, ED    Imaging Review Dg Chest 2 View  03/14/2015   CLINICAL DATA:  Shortness of breath for 2 months. Chest tightness with nausea today. History of pacemaker. Initial encounter.  EXAM: CHEST  2 VIEW  COMPARISON:  12/02/2014.  FINDINGS: Left subclavian pacemaker leads appear unchanged within the right atrium and right ventricle. The heart size and mediastinal contours are stable with aortic atherosclerosis. Coarsened interstitial markings are stable. There is no edema, confluent airspace opacity or significant pleural  effusion. The bones appear unchanged.  IMPRESSION: Stable chest.  No acute cardiopulmonary process.   Electronically Signed   By: Richardean Sale M.D.   On: 03/14/2015 19:19   I have personally reviewed and evaluated these images and lab results as part of my medical decision-making.   EKG Interpretation   Date/Time:  Wednesday March 14 2015 17:36:52 EDT Ventricular Rate:  65 PR Interval:  228 QRS Duration: 101 QT Interval:  492 QTC Calculation: 512 R Axis:   -45 Text Interpretation:  Sinus rhythm Prolonged PR interval Left anterior  fascicular block Probable anteroseptal infarct, old Minimal ST depression,  lateral leads Prolonged QT interval Baseline wander in lead(s) V6 No  significant change since last tracing Confirmed by ZACKOWSKI  MD, SCOTT  [phone]) on 03/14/2015 5:40:11 PM      MDM   Final diagnoses:  Lightheadedness  Shortness of breath    Patient presents with 15 minute episode of sudden lightheadedness, dizziness, diaphoresis, and pallor while performing PFTs at the pulmonology clinic today. Denies chest pain, cough, fever, chills, vomiting. On exam, no focal neurological deficits. No pronator drift. Gait is normal without ataxia. Lungs CTAB. Chest x-ray pending. Labs pending. EKG shows no changes from previous.  Will interrogate pacemaker.  Doubt stroke. Troponin negative, Cr 1.15 (baseline). BNP 197.2. CBC normal. Chest x-ray shows stable chest, and no acute cardiopulmonary processes.  7:56 PM, plan at this time: Pacemaker interrogation pending. If signs of arrhythmia will consult cardiology. If no abnormalities on interrogation, will discharge home with pulmonary and PCP follow-up.  Care will be deferred to [facility], PA-C at shift change.  Case has been discussed with and seen by Dr. Rogene Houston who agrees with the above plan.     Gloriann Loan, PA-C 03/14/15 2136  Fredia Sorrow, MD 03/14/15 [phone]

## 2015-03-14 NOTE — ED Notes (Signed)
[employer] rep paged to come to ED and interrogate pacemaker. Original interrogation done by Shanon Brow, RN did not transmit due to routine maintenance. [employer] employee informed me that it would be more than 2 hours before we would be able to transmit anything. [employer] rep paged to come interrogate device.

## 2015-03-14 NOTE — ED Provider Notes (Signed)
Patient care acquired from Gloriann Loan, PA-C pending pacemaker interrogation.  Results for orders placed or performed during the hospital encounter of 03/14/15  Brain natriuretic peptide  Result Value Ref Range   B Natriuretic Peptide 197.2 (H) 0.0 - 100.0 pg/mL  CBC with Differential  Result Value Ref Range   WBC 5.2 4.0 - 10.5 K/uL   RBC 4.24 3.87 - 5.11 MIL/uL   Hemoglobin 12.0 12.0 - 15.0 g/dL   HCT 36.6 36.0 - 46.0 %   MCV 86.3 78.0 - 100.0 fL   MCH 28.3 26.0 - 34.0 pg   MCHC 32.8 30.0 - 36.0 g/dL   RDW 14.5 11.5 - 15.5 %   Platelets 123 (L) 150 - 400 K/uL   Neutrophils Relative % 63 %   Neutro Abs 3.3 1.7 - 7.7 K/uL   Lymphocytes Relative 25 %   Lymphs Abs 1.3 0.7 - 4.0 K/uL   Monocytes Relative 9 %   Monocytes Absolute 0.5 0.1 - 1.0 K/uL   Eosinophils Relative 2 %   Eosinophils Absolute 0.1 0.0 - 0.7 K/uL   Basophils Relative 1 %   Basophils Absolute 0.0 0.0 - 0.1 K/uL  Basic metabolic panel  Result Value Ref Range   Sodium 138 135 - 145 mmol/L   Potassium 4.2 3.5 - 5.1 mmol/L   Chloride 104 101 - 111 mmol/L   CO2 24 22 - 32 mmol/L   Glucose, Bld 91 65 - 99 mg/dL   BUN 23 (H) 6 - 20 mg/dL   Creatinine, Ser 1.15 (H) 0.44 - 1.00 mg/dL   Calcium 9.0 8.9 - 10.3 mg/dL   GFR calc non Af Amer 43 (L) >60 mL/min   GFR calc Af Amer 50 (L) >60 mL/min   Anion gap 10 5 - 15  I-stat troponin, ED  (not at [facility])  Result Value Ref Range   Troponin i, poc 0.00 0.00 - 0.08 ng/mL   Comment 3           Dg Chest 2 View  03/14/2015   CLINICAL DATA:  Shortness of breath for 2 months. Chest tightness with nausea today. History of pacemaker. Initial encounter.  EXAM: CHEST  2 VIEW  COMPARISON:  12/02/2014.  FINDINGS: Left subclavian pacemaker leads appear unchanged within the right atrium and right ventricle. The heart size and mediastinal contours are stable with aortic atherosclerosis. Coarsened interstitial markings are stable. There is no edema, confluent airspace opacity or  significant pleural effusion. The bones appear unchanged.  IMPRESSION: Stable chest.  No acute cardiopulmonary process.   Electronically Signed   By: Richardean Sale M.D.   On: 03/14/2015 19:19     1. Lightheadedness   2. Shortness of breath    Filed Vitals:   03/14/15 2015  BP: 153/54  Pulse: 65  Temp:   Resp: 15   Afebrile, NAD, non-toxic appearing, AAOx4.   I have reviewed nursing notes, vital signs, and all lab and imaging results as noted above.  Pacemaker interrogated without sign of malfunction or arrhythmia. Patient able to ambulate numerous times in ED without lightheadedness or increase work of breathing or shortness of breath. Return precautions discussed. Patient is agreeable to plan. Patient is stable at time of discharge  Patient d/w with Dr. Rogene Houston, agrees with plan.    Baron Sane, PA-C 03/15/15 0019  Fredia Sorrow, MD 03/22/15 [id number]  Fredia Sorrow, MD 03/22/15 [phone]

## 2015-03-14 NOTE — Discharge Instructions (Signed)
Please follow up with your primary care physician in 1-2 days. If you do not have one please call the [place] number listed above. Please follow up with your heart doctor to schedule a follow up appointment.  Please read all discharge instructions and return precautions.    Dizziness Dizziness is a common problem. It is a feeling of unsteadiness or light-headedness. You may feel like you are about to faint. Dizziness can lead to injury if you stumble or fall. A person of any age group can suffer from dizziness, but dizziness is more common in older adults. CAUSES  Dizziness can be caused by many different things, including:  Middle ear problems.  Standing for too long.  Infections.  An allergic reaction.  Aging.  An emotional response to something, such as the sight of blood.  Side effects of medicines.  Tiredness.  Problems with circulation or blood pressure.  Excessive use of alcohol or medicines, or illegal drug use.  Breathing too fast (hyperventilation).  An irregular heart rhythm (arrhythmia).  A low red blood cell count (anemia).  Pregnancy.  Vomiting, diarrhea, fever, or other illnesses that cause body fluid loss (dehydration).  Diseases or conditions such as Parkinson's disease, high blood pressure (hypertension), diabetes, and thyroid problems.  Exposure to extreme heat. DIAGNOSIS  Your health care provider will ask about your symptoms, perform a physical exam, and perform an electrocardiogram (ECG) to record the electrical activity of your heart. Your health care provider may also perform other heart or blood tests to determine the cause of your dizziness. These may include:  Transthoracic echocardiogram (TTE). During echocardiography, sound waves are used to evaluate how blood flows through your heart.  Transesophageal echocardiogram (TEE).  Cardiac monitoring. This allows your health care provider to monitor your heart rate and  rhythm in real time.  Holter monitor. This is a portable device that records your heartbeat and can help diagnose heart arrhythmias. It allows your health care provider to track your heart activity for several days if needed.  Stress tests by exercise or by giving medicine that makes the heart beat faster. TREATMENT  Treatment of dizziness depends on the cause of your symptoms and can vary greatly. HOME CARE INSTRUCTIONS   Drink enough fluids to keep your urine clear or pale yellow. This is especially important in very hot weather. In older adults, it is also important in cold weather.  Take your medicine exactly as directed if your dizziness is caused by medicines. When taking blood pressure medicines, it is especially important to get up slowly.  Rise slowly from chairs and steady yourself until you feel okay.  In the morning, first sit up on the side of the bed. When you feel okay, stand slowly while holding onto something until you know your balance is fine.  Move your legs often if you need to stand in one place for a long time. Tighten and relax your muscles in your legs while standing.  Have someone stay with you for 1-2 days if dizziness continues to be a problem. Do this until you feel you are well enough to stay alone. Have the person call your health care provider if he or she notices changes in you that are concerning.  Do not drive or use heavy machinery if you feel dizzy.  Do not drink alcohol. SEEK IMMEDIATE MEDICAL CARE IF:   Your dizziness or light-headedness gets worse.  You feel nauseous or vomit.  You have problems talking, walking,  or using your arms, hands, or legs.  You feel weak.  You are not thinking clearly or you have trouble forming sentences. It may take a friend or family member to notice this.  You have chest pain, abdominal pain, shortness of breath, or sweating.  Your vision changes.  You notice any bleeding.  You have side effects from  medicine that seems to be getting worse rather than better. MAKE SURE YOU:   Understand these instructions.  Will watch your condition.  Will get help right away if you are not doing well or get worse. Document Released: 12/03/2000 Document Revised: 06/14/2013 Document Reviewed: 12/27/2010 [facility] Patient Information 2015 [place], [place]. This information is not intended to replace advice given to you by your health care provider. Make sure you discuss any questions you have with your health care provider.

## 2015-03-19 ENCOUNTER — Encounter: Payer: Medicare Other | Attending: Physical Medicine & Rehabilitation | Admitting: Physical Medicine & Rehabilitation

## 2015-03-19 ENCOUNTER — Encounter: Payer: Self-pay | Admitting: Physical Medicine & Rehabilitation

## 2015-03-19 VITALS — BP 119/68 | HR 70 | Resp 14

## 2015-03-19 DIAGNOSIS — E785 Hyperlipidemia, unspecified: Secondary | ICD-10-CM | POA: Diagnosis not present

## 2015-03-19 DIAGNOSIS — M069 Rheumatoid arthritis, unspecified: Secondary | ICD-10-CM | POA: Diagnosis not present

## 2015-03-19 DIAGNOSIS — I495 Sick sinus syndrome: Secondary | ICD-10-CM | POA: Insufficient documentation

## 2015-03-19 DIAGNOSIS — F329 Major depressive disorder, single episode, unspecified: Secondary | ICD-10-CM | POA: Insufficient documentation

## 2015-03-19 DIAGNOSIS — M1712 Unilateral primary osteoarthritis, left knee: Secondary | ICD-10-CM | POA: Diagnosis not present

## 2015-03-19 DIAGNOSIS — G40909 Epilepsy, unspecified, not intractable, without status epilepticus: Secondary | ICD-10-CM

## 2015-03-19 DIAGNOSIS — R600 Localized edema: Secondary | ICD-10-CM | POA: Insufficient documentation

## 2015-03-19 DIAGNOSIS — R4701 Aphasia: Secondary | ICD-10-CM | POA: Diagnosis not present

## 2015-03-19 DIAGNOSIS — M7062 Trochanteric bursitis, left hip: Secondary | ICD-10-CM

## 2015-03-19 DIAGNOSIS — M7061 Trochanteric bursitis, right hip: Secondary | ICD-10-CM | POA: Insufficient documentation

## 2015-03-19 DIAGNOSIS — I639 Cerebral infarction, unspecified: Secondary | ICD-10-CM

## 2015-03-19 DIAGNOSIS — I10 Essential (primary) hypertension: Secondary | ICD-10-CM | POA: Insufficient documentation

## 2015-03-19 DIAGNOSIS — R002 Palpitations: Secondary | ICD-10-CM | POA: Insufficient documentation

## 2015-03-19 DIAGNOSIS — G459 Transient cerebral ischemic attack, unspecified: Secondary | ICD-10-CM | POA: Diagnosis not present

## 2015-03-19 DIAGNOSIS — M25552 Pain in left hip: Secondary | ICD-10-CM | POA: Diagnosis not present

## 2015-03-19 DIAGNOSIS — R269 Unspecified abnormalities of gait and mobility: Secondary | ICD-10-CM

## 2015-03-19 DIAGNOSIS — M25562 Pain in left knee: Secondary | ICD-10-CM | POA: Diagnosis not present

## 2015-03-19 DIAGNOSIS — I341 Nonrheumatic mitral (valve) prolapse: Secondary | ICD-10-CM | POA: Diagnosis not present

## 2015-03-19 DIAGNOSIS — M1711 Unilateral primary osteoarthritis, right knee: Secondary | ICD-10-CM | POA: Insufficient documentation

## 2015-03-19 DIAGNOSIS — Z950 Presence of cardiac pacemaker: Secondary | ICD-10-CM | POA: Insufficient documentation

## 2015-03-19 DIAGNOSIS — I480 Paroxysmal atrial fibrillation: Secondary | ICD-10-CM | POA: Diagnosis not present

## 2015-03-19 DIAGNOSIS — Z87891 Personal history of nicotine dependence: Secondary | ICD-10-CM | POA: Insufficient documentation

## 2015-03-19 MED ORDER — DICLOFENAC SODIUM 1 % TD GEL: 1.0000 "application " | Freq: Three times a day (TID) | TRANSDERMAL | Status: DC

## 2015-03-19 NOTE — Patient Instructions (Addendum)
PLEASE CALL ME WITH ANY PROBLEMS OR QUESTIONS (#[phone]).  HAVE A GOOD DAY!   Trochanteric Bursitis You have hip pain due to trochanteric bursitis. Bursitis means that the sack near the outside of the hip is filled with fluid and inflamed. This sack is made up of protective soft tissue. The pain from trochanteric bursitis can be severe and keep you from sleep. It can radiate to the buttocks or down the outside of the thigh to the knee. The pain is almost always worse when rising from the seated or lying position and with walking. Pain can improve after you take a few steps. It happens more often in people with hip joint and lumbar spine problems, such as arthritis or previous surgery. Very rarely the trochanteric bursa can become infected, and antibiotics and/or surgery may be needed. Treatment often includes an injection of local anesthetic mixed with cortisone medicine. This medicine is injected into the area where it is most tender over the hip. Repeat injections may be necessary if the response to treatment is slow. You can apply ice packs over the tender area for 30 minutes every 2 hours for the next few days. Anti-inflammatory and/or narcotic pain medicine may also be helpful. Limit your activity for the next few days if the pain continues. See your caregiver in 5-10 days if you are not greatly improved.  SEEK IMMEDIATE MEDICAL CARE IF:  You develop severe pain, fever, or increased redness.  You have pain that radiates below the knee. EXERCISES STRETCHING EXERCISES - Trochanteric Bursitis  These exercises may help you when beginning to rehabilitate your injury. Your symptoms may resolve with or without further involvement from your physician, physical therapist, or athletic trainer. While completing these exercises, remember:   Restoring tissue flexibility helps normal motion to return to the joints. This allows healthier, less painful movement and activity.  An effective stretch should  be held for at least 30 seconds.  A stretch should never be painful. You should only feel a gentle lengthening or release in the stretched tissue. STRETCH - Iliotibial Band  On the floor or bed, lie on your side so your injured leg is on top. Bend your knee and grab your ankle.  Slowly bring your knee back so that your thigh is in line with your trunk. Keep your heel at your buttocks and gently arch your back so your head, shoulders and hips line up.  Slowly lower your leg so that your knee approaches the floor/bed until you feel a gentle stretch on the outside of your thigh. If you do not feel a stretch and your knee will not fall farther, place the heel of your opposite foot on top of your knee and pull your thigh down farther.  Hold this stretch for __________ seconds.  Repeat __________ times. Complete this exercise __________ times per day. STRETCH - Hamstrings, Supine   Lie on your back. Loop a belt or towel over the ball of your foot as shown.  Straighten your knee and slowly pull on the belt to raise your injured leg. Do not allow the knee to bend. Keep your opposite leg flat on the floor.  Raise the leg until you feel a gentle stretch behind your knee or thigh. Hold this position for __________ seconds.  Repeat __________ times. Complete this stretch __________ times per day. STRETCH - Quadriceps, Prone   Lie on your stomach on a firm surface, such as a bed or padded floor.  Bend your knee and grasp  your ankle. If you are unable to reach your ankle or pant leg, use a belt around your foot to lengthen your reach.  Gently pull your heel toward your buttocks. Your knee should not slide out to the side. You should feel a stretch in the front of your thigh and/or knee.  Hold this position for __________ seconds.  Repeat __________ times. Complete this stretch __________ times per day. STRETCHING - Hip Flexors, Lunge Half kneel with your knee on the floor and your opposite knee  bent and directly over your ankle.  Keep good posture with your head over your shoulders. Tighten your buttocks to point your tailbone downward; this will prevent your back from arching too much.  You should feel a gentle stretch in the front of your thigh and/or hip. If you do not feel any resistance, slightly slide your opposite foot forward and then slowly lunge forward so your knee once again lines up over your ankle. Be sure your tailbone remains pointed downward.  Hold this stretch for __________ seconds.  Repeat __________ times. Complete this stretch __________ times per day. STRETCH - Adductors, Lunge  While standing, spread your legs.  Lean away from your injured leg by bending your opposite knee. You may rest your hands on your thigh for balance.  You should feel a stretch in your inner thigh. Hold for __________ seconds.  Repeat __________ times. Complete this exercise __________ times per day. Document Released: 07/17/2004 Document Revised: 10/24/2013 Document Reviewed: 09/21/2008 [facility] Patient Information 2015 [place], [place]. This information is not intended to replace advice given to you by your health care provider. Make sure you discuss any questions you have with your health care provider.

## 2015-03-19 NOTE — Progress Notes (Signed)
Subjective:    Patient ID: Ann Kaufman, female    DOB: 06/02/1930, 81 y.o.   MRN: [id number]  HPI   Ann Kaufman is here in follow up of her chronic gait deficits. She had a fall in June when she lost her balance on a curb. She was in the hospital briefly after suffering a "TIA" while in the ED. She fortunately suffered only a few scratches and soft tissue injuries.   She has been living across the street from her daughter for the last month. She had been living in an apartment community prior. She is currently doing simple house chores, wash, self-care, cooking, etc on her own. She has a 3 STE home. Ann Kaufman has a quad cane   She would like to improve her balance and stamina so that she's better able to move outside the house and do so with more stability.  She is also having pain in her left knee and lateral thigh towards the left hip. The pain will worse with standing and bending.    Pain Inventory Average Pain 8 Pain Right Now 8 My pain is constant and stabbing  In the last 24 hours, has pain interfered with the following? General activity 8 Relation with others 0 Enjoyment of life 7 What TIME of day is your pain at its worst? morning, evening, night Sleep (in general) Fair  Pain is worse with: walking, standing and some activites Pain improves with: rest, heat/ice and therapy/exercise Relief from Meds: 3  Mobility walk without assistance how many minutes can you walk? very little ability to climb steps?  yes do you drive?  no  Function retired  Neuro/Psych weakness numbness tremor trouble walking spasms anxiety  Prior Studies Any changes since last visit?  yes x-rays CT/MRI  Physicians involved in your care Any changes since last visit?  no   Family History  Problem Relation Age of Onset  . Other      negative for premature CAD.  Marland Kitchen Aneurysm Father    Social History   Social History  . Marital Status: Married    Spouse Name: Eddie Dibbles  . Number of  Children: 3  . Years of Education: 12   Occupational History  . retired    Social History Main Topics  . Smoking status: Former Smoker    Quit date: 11/26/1968  . Smokeless tobacco: None  . Alcohol Use: Yes  . Drug Use: No  . Sexual Activity: Not Asked   Other Topics Concern  . None   Social History Narrative   Lives in [place] by herself.  Daughter lives in [place].   Past Surgical History  Procedure Laterality Date  . Pacemaker insertion      MD[facility]  . Abdominal hysterectomy    . Breast lumpectomy    . Appendectomy    . Bowel resection    . Lead revision  09/17/11    s/p atrial lead revision   Past Medical History  Diagnosis Date  . Intracerebral hemorrhage     a. 04/2013 in setting of xarelto therapy.  . Atrial fibrillation 2012    a. Dx in 2012;  b. Rhythm controlled - amiodarone, previously anticoagulated with xarelto (d/c'd 04/2013 in setting of [place]);  b. 04/2013 Echo: EF 55-60%, Gr 1 DD, mild MR, mod dil LA, PAsP 24mmhg  . Hypertension   . Depression   . Arthritis   . Presence of permanent cardiac pacemaker     a. 2012  MDT, placed in [place] (probable tachy-brady).  . Pacemaker   . SSS (sick sinus syndrome)     s/p dual chamber pacemaker  . PAF (paroxysmal atrial fibrillation)   . Aphasia     Occasional aphasia & facial numbness from a left frontal lobe intercerebral hemorrhage on Xarelto on 05/18/13.  Marland Kitchen Pericardial effusion   . Mitral valve prolapse   . Hyperlipidemia   . Rheumatoid arthritis   . SOB (shortness of breath)     Occasional SOB  . Palpitations     Occasional.  . Peripheral edema     Because she has been standing on her feet more than usual   . Headache    BP 119/68 mmHg  Pulse 70  Resp 14  SpO2 98%  Opioid Risk Score:   Fall Risk Score:  `1  Depression screen PHQ 2/9  No flowsheet data found.   Review of Systems  Musculoskeletal: Positive for gait problem.  Neurological: Positive for tremors, weakness and  numbness.       Spasms  All other systems reviewed and are negative.      Objective:   Physical Exam   Constitutional: She is oriented to person, place, and time. She appears well-developed and well-nourished.  HENT:  Head: Normocephalic and atraumatic.  Eyes: Conjunctivae and EOM are normal. Pupils are equal, round, and reactive to light. Right eye exhibits no discharge. Left eye exhibits no discharge. No scleral icterus.  Neck: Normal range of motion. Neck supple.  Cardiovascular: Normal rate and regular rhythm. Exam reveals no gallop and no friction rub.  No murmur heard. No obvious edema on exam.  Respiratory: Effort normal. No respiratory distress. She has no wheezes.  GI: Soft. Bowel sounds are normal. She exhibits no distension. There is no tenderness.  Musculoskeletal: thoracic kyphosis, head forward position. Left knee valgus, mild medial joint line pain. Left greater troch tender with palpation, mild low back tenderness,PSIS tendernes. Neurological:   Right facial weakness improved. Speech clear. Language is less apraxic. Continued word finding deficits. Comprehension is  good. alert. Follows simple commands. Improved insight and awareness. Oriented to place, month, year with cues. Moves all 4's fairly equally now. still struggles with full weight bearing on the right. Tends to lean toward the left still to compensate. No risk of fallingf.   Skin: Skin is warm and dry.  A few limb bruises noted  Psychiatric: pleasant, appropriate, carried a conversation despite language issues. Good insight and awareness overall    Assessment/Plan:  1. Functional deficits secondary to left frontal hematoma with seizure activity  ----made a referral to neuro rehab to address gait and improve balance, dexterity  -modalities for left hip pain also  2. Lumbar spondylosis--lidoderm patches prn, tylenol,   3. Left Knee pain--mild OA.   -voltaren gel, pacing, improved mechanics 4. Left hip pain,  trochanteric bursitis  -ice, stretches were provided  -therapy as above  Follow up with me in 2 months. Thirty minutes of face to face patient care time were spent during this visit. All questions were encouraged and answered.

## 2015-03-31 ENCOUNTER — Other Ambulatory Visit: Payer: Self-pay | Admitting: Internal Medicine

## 2015-04-16 ENCOUNTER — Ambulatory Visit: Payer: Medicare Other | Attending: Physical Medicine & Rehabilitation | Admitting: Physical Therapy

## 2015-04-16 DIAGNOSIS — R2681 Unsteadiness on feet: Secondary | ICD-10-CM | POA: Diagnosis not present

## 2015-04-16 DIAGNOSIS — R269 Unspecified abnormalities of gait and mobility: Secondary | ICD-10-CM | POA: Insufficient documentation

## 2015-04-16 DIAGNOSIS — M25552 Pain in left hip: Secondary | ICD-10-CM | POA: Diagnosis not present

## 2015-04-16 DIAGNOSIS — M6281 Muscle weakness (generalized): Secondary | ICD-10-CM | POA: Insufficient documentation

## 2015-04-16 NOTE — Therapy (Signed)
[facility] [address] [place], [place], [address] Phone: [phone]   Fax:  [phone]  Physical Therapy Evaluation  Patient Details  Name: Ann Kaufman MRN: [id number] Date of Birth: 31-May-1930 Referring Provider: Alger Simons, MD  Encounter Date: 04/16/2015      PT End of Session - 04/16/15 1109    Visit Number 1   Number of Visits 17  eval + 16 visits   Date for PT Re-Evaluation 06/15/15   Authorization Type Medicare primary; [employer] secondary - G Codes every 10 visits   PT Start Time 0932   PT Stop Time 1019   PT Time Calculation (min) 47 min   Activity Tolerance Patient tolerated treatment well   Behavior During Therapy [employer] for tasks assessed/performed      Past Medical History  Diagnosis Date  . Intracerebral hemorrhage     a. 04/2013 in setting of xarelto therapy.  . Atrial fibrillation 2012    a. Dx in 2012;  b. Rhythm controlled - amiodarone, previously anticoagulated with xarelto (d/c'd 04/2013 in setting of [place]);  b. 04/2013 Echo: EF 55-60%, Gr 1 DD, mild MR, mod dil LA, PAsP 40mmhg  . Hypertension   . Depression   . Arthritis   . Presence of permanent cardiac pacemaker     a. 2012 MDT, placed in [place] (probable tachy-brady).  . Pacemaker   . SSS (sick sinus syndrome)     s/p dual chamber pacemaker  . PAF (paroxysmal atrial fibrillation)   . Aphasia     Occasional aphasia & facial numbness from a left frontal lobe intercerebral hemorrhage on Xarelto on 05/18/13.  Marland Kitchen Pericardial effusion   . Mitral valve prolapse   . Hyperlipidemia   . Rheumatoid arthritis   . SOB (shortness of breath)     Occasional SOB  . Palpitations     Occasional.  . Peripheral edema     Because she has been standing on her feet more than usual   . Headache     Past Surgical History  Procedure Laterality Date  . Pacemaker insertion      MDT implanted at [facility]  . Abdominal hysterectomy    . Breast  lumpectomy    . Appendectomy    . Bowel resection    . Lead revision  09/17/11    s/p atrial lead revision    There were no vitals filed for this visit.  Visit Diagnosis:  Abnormality of gait - Plan: PT plan of care cert/re-cert  Hip pain, left - Plan: PT plan of care cert/re-cert  Unsteadiness - Plan: PT plan of care cert/re-cert  Muscle weakness (generalized) - Plan: PT plan of care cert/re-cert      Subjective Assessment - 04/16/15 0945    Subjective "Everything seems to be falling apart on the left side of my body," per pt. Pt reports frequent falls ("I'm doing better lately lately; only a few times in the past 6 months," per pt). Pt attributes to forgetting to use her cane. Reports that falls have been caused by dizziness, LOB when bending over, picking up objects.   Pertinent History pacemaker, A-fib, ICH (04/2013), L trochanteric bursitis, OA   Limitations Walking;House hold activities   Patient Stated Goals "1) Be able to bend over and pick stuff up from the ground without falling over; 2) be able to use my left leg better; 3) get rid of my pain."   Currently in Pain? No/denies  6/10 at worst;  when rolling onto L side in bed            [facility] PT Assessment - 04/16/15 0001    Assessment   Medical Diagnosis L trochanteric bursitis, L knee OA, gait instability   Referring Provider Alger Simons, MD   Onset Date/Surgical Date 03/14/15  date of MD referral, as pt unable to recall date of onset   Hand Dominance Left   Precautions   Precautions Fall;ICD/Pacemaker   Restrictions   Weight Bearing Restrictions No   Balance Screen   Has the patient fallen in the past 6 months Yes   How many times? 2   Has the patient had a decrease in activity level because of a fear of falling?  Yes   Is the patient reluctant to leave their home because of a fear of falling?  No   Home Environment   Living Environment Private residence   Living Arrangements Alone  daughter lives across  the street   Available Help at Discharge Family   Type of [place] to enter   Entrance Stairs-Number of Steps 3   [place] One level   [place] - single point   Prior Function   Level of Independence Independent with household mobility without device;Independent with community mobility without device   Vocation Retired   Charity fundraiser Status Impaired/Different from baseline   Observation/Other Assessments   Skin Integrity Ecchymosis on anterior aspect of L tibia    Coordination   Gross Motor Movements are Fluid and Coordinated Yes   Heel Shin Test Coordination limited on L > R LE.   ROM / Strength   AROM / PROM / Strength Strength   Strength   Overall Strength Deficits   Overall Strength Comments 3-/5 L hip ABD, 4/5 B hip extension, R hip ABD, L knee flexion, and L ankle PF   Flexibility   Soft Tissue Assessment /Muscle Length yes   ITB L Ober's Test (+) and reproduced concordant pain at lateral aspect of L hip.   Bed Mobility   Bed Mobility Supine to Sit;Sit to Supine   Supine to Sit 6: Modified independent (Device/Increase time)   Sit to Supine 6: Modified independent (Device/Increase time)   Transfers   Transfers Sit to Stand;Stand to Sit   Sit to Stand 6: Modified independent (Device/Increase time)   Five time sit to stand comments  20.54 sec without UE use   Stand to Sit 6: Modified independent (Device/Increase time)   Ambulation/Gait   Ambulation/Gait Yes   Ambulation/Gait Assistance 5: Supervision   Ambulation Distance (Feet) 200 Feet   Assistive device Straight cane   Ambulation Surface Level;Indoor   Gait velocity 1.62 ft/sec   Stairs Yes   Stairs Assistance 4: Min guard  ineffective carryover of cueing for technique   Stair Management Technique Step to pattern;One rail Left;Forwards   Number of Stairs 4   Height of Stairs 6                   [facility] Adult PT  Treatment/Exercise - 04/16/15 0001    Ambulation/Gait   Gait Pattern Step-through pattern;Left foot flat;Lateral hip instability;Wide base of support;Decreased weight shift to right;Right foot flat;Lateral trunk lean to left   Posture/Postural Control   Posture/Postural Control Postural limitations   Postural Limitations Rounded Shoulders;Forward head;Increased thoracic kyphosis;Posterior pelvic tilt;Flexed trunk  PT Education - 04/16/15 1106    Education provided Yes   Education Details PT eval findings, goals, and POC.   Person(s) Educated Patient   Methods Explanation   Comprehension Verbalized understanding          PT Short Term Goals - 04/16/15 1937    PT SHORT TERM GOAL #1   Title Pt will perform initialy HEP with mod I using paper handout to maximize functional gains made in PT. Target date: 05/14/15   PT SHORT TERM GOAL #2   Title Perform DGI to assess/address dynamic gait stability. Target date: 05/14/15   PT SHORT TERM GOAL #3   Title Pt will improve gait velocity from 1.62 ft/sec to >1.8 ft/sec to indicate decreased risk of recurrent falls. Target date: 05/14/15   PT SHORT TERM GOAL #4   Title Pt will negotiate 3 steps with L rail and mod I to enable pt to safely use primary entrance of home. Target date: 05/14/15           PT Long Term Goals - 04/16/15 1947    PT LONG TERM GOAL #1   Title Pt will verbalize understanding of fall prevention strategies within home environment to decrease fall risk. Target date: 06/11/15   PT LONG TERM GOAL #2   Title Pt will decrease 5x Sit-to-Stand time from 20.54 seconds to > 15 seconds to indicate decreased fall risk, improved functional LE strength.  Target date: 06/11/15   PT LONG TERM GOAL #3   Title Pt will improve DGI score by 4 points from baseline to indicate significant improvement in dynamic gait stabiliy.  Target date: 06/11/15   PT LONG TERM GOAL #4   Title Pt will negotiate standard ramp and  curb step with mod I using LRAD to indicate safety traversing community obstacles. Target date: 06/11/15   PT LONG TERM GOAL #5   Title Pt will ambulate 500' over unlevel, paved surfaces with mod I using LRAD to indicate safety with limited community mobility. Target date: 06/11/15   Additional Long Term Goals   Additional Long Term Goals Yes   PT LONG TERM GOAL #6   Title Pt will verbalize understanding of 2 methods of mitigating pain to decrease impact of pain on sleep and improve QoL. Target date: 06/11/15               Plan - 04/16/15 1113    Clinical Impression Statement Pt is an 81 y/o female with PMH significant for ICH (04/2013) referred to outpatient PT to address gait instability and L trochanteric bursitis. PT evaluation reveals the following impairments: decreased functional LE strength, as indicated by 5x Sit to Stand; self-selected gait speed suggestive of risk of recurrent falls; cognitive impairments associated wih ICH in 04/2013; weakness in core musculature and LLE; decreased stability/independence with negotiation of stairs and community obstacles; L lateral hip pain associated with L trochanteric bursitis; coordination impairment; decreased extensibility of L IT band. Pt will benefit from skilled outpatient PT 2x/week for 8 weeks to address said impairments.   Pt will benefit from skilled therapeutic intervention in order to improve on the following deficits Abnormal gait;Decreased range of motion;Impaired flexibility;Decreased knowledge of use of DME;Decreased strength;Decreased mobility;Decreased endurance;Decreased balance;Decreased safety awareness;Decreased cognition;Decreased coordination;Dizziness;Postural dysfunction   Rehab Potential Good   PT Frequency 2x / week   PT Duration 8 weeks   PT Treatment/Interventions ADLs/Self Care Home Management;Neuromuscular re-education;Gait training;Stair training;Patient/family education;Therapeutic activities;Therapeutic  exercise;Balance training;DME Instruction;Manual techniques;Energy conservation;Functional mobility training;Vestibular  PT Next Visit Plan Perform DGI; initiate HEP.   PT Home Exercise Plan Consider stretching of L IT band, strengthening of B hip ABD/extension (focus on LLE).   Consulted and Agree with Plan of Care Patient          G-Codes - 18-Apr-2015 1958    Functional Assessment Tool Used gait velocity 1.62 ft/sec (suggesting risk of recurrent falls)   Functional Limitation Mobility: Walking and moving around   Mobility: Walking and Moving Around Current Status [phone]) At least 40 percent but less than 60 percent impaired, limited or restricted   Mobility: Walking and Moving Around Goal Status [phone]) At least 20 percent but less than 40 percent impaired, limited or restricted       Problem List Patient Active Problem List   Diagnosis Date Noted  . Osteoarthritis of right knee 03/19/2015  . Trochanteric bursitis of left hip 03/19/2015  . Gait disorder 03/19/2015  . Hyperlipidemia 12/08/2014  . Acute right-sided weakness 12/02/2014  . Pulmonary HTN ([place]) 12/02/2014  . DOE (dyspnea on exertion) 12/02/2014  . Chronic diastolic heart failure, NYHA class 1 ([place]) 12/02/2014  . TIA (transient ischemic attack) 12/02/2014  . Cerebral infarction due to unspecified mechanism   . Myofascial muscle pain 03/20/2014  . Sick sinus syndrome ([place]) 10/19/2013  . Seizure disorder after stroke 07/24/2013  . Syncope 05/30/2013  . Pacemaker 05/30/2013  . Chronic anticoagulation 05/19/2013  . History of ICH (intracerebral hemorrhage) 05/18/2013  . Atrial fibrillation, chronic ([place]) 04/06/2013  . Atherosclerosis of aorta ([place]) 04/06/2013  . Pleural effusion 11/27/2011  . HTN (hypertension) 11/27/2011   Billie Ruddy, PT, DPT [facility] [address] [place], [place], [address] Phone: [phone]   Fax:  [phone] Apr 18, 2015, 8:03 PM  Name:  Ann Kaufman MRN: [id number] Date of Birth: 1929-07-21

## 2015-04-20 ENCOUNTER — Ambulatory Visit: Payer: Medicare Other | Admitting: Rehabilitation

## 2015-04-20 ENCOUNTER — Encounter: Payer: Self-pay | Admitting: Rehabilitation

## 2015-04-20 DIAGNOSIS — R269 Unspecified abnormalities of gait and mobility: Secondary | ICD-10-CM

## 2015-04-20 DIAGNOSIS — M6281 Muscle weakness (generalized): Secondary | ICD-10-CM | POA: Diagnosis not present

## 2015-04-20 DIAGNOSIS — R2681 Unsteadiness on feet: Secondary | ICD-10-CM | POA: Diagnosis not present

## 2015-04-20 DIAGNOSIS — M25552 Pain in left hip: Secondary | ICD-10-CM | POA: Diagnosis not present

## 2015-04-20 NOTE — Therapy (Signed)
[facility] [address] [place], [place], [address] Phone: [phone]   Fax:  [phone]  Physical Therapy Treatment  Patient Details  Name: Ann Kaufman MRN: [id number] Date of Birth: 09/14/1929 Referring Provider: Alger Simons, MD  Encounter Date: 04/20/2015      PT End of Session - 04/20/15 1309    Visit Number 2   Number of Visits 17   Date for PT Re-Evaluation 06/15/15   Authorization Type Medicare primary; [employer] secondary - G Codes every 10 visits   PT Start Time 1300   PT Stop Time 1346   PT Time Calculation (min) 46 min   Equipment Utilized During Treatment Gait belt   Activity Tolerance Patient tolerated treatment well   Behavior During Therapy [facility] for tasks assessed/performed      Past Medical History  Diagnosis Date  . Intracerebral hemorrhage ([place])     a. 04/2013 in setting of xarelto therapy.  . Atrial fibrillation ([place]) 2012    a. Dx in 2012;  b. Rhythm controlled - amiodarone, previously anticoagulated with xarelto (d/c'd 04/2013 in setting of [place]);  b. 04/2013 Echo: EF 55-60%, Gr 1 DD, mild MR, mod dil LA, PAsP 29mmhg  . Hypertension   . Depression   . Arthritis   . Presence of permanent cardiac pacemaker     a. 2012 MDT, placed in [place] (probable tachy-brady).  . Pacemaker   . SSS (sick sinus syndrome) (HCC)     s/p dual chamber pacemaker  . PAF (paroxysmal atrial fibrillation) ([place])   . Aphasia     Occasional aphasia & facial numbness from a left frontal lobe intercerebral hemorrhage on Xarelto on 05/18/13.  Marland Kitchen Pericardial effusion   . Mitral valve prolapse   . Hyperlipidemia   . Rheumatoid arthritis ([place])   . SOB (shortness of breath)     Occasional SOB  . Palpitations     Occasional.  . Peripheral edema     Because she has been standing on her feet more than usual   . Headache     Past Surgical History  Procedure Laterality Date  . Pacemaker insertion      MDT  implanted at [facility]  . Abdominal hysterectomy    . Breast lumpectomy    . Appendectomy    . Bowel resection    . Lead revision  09/17/11    s/p atrial lead revision    There were no vitals filed for this visit.  Visit Diagnosis:  Muscle weakness (generalized)  Abnormality of gait  Unsteadiness      Subjective Assessment - 04/20/15 1304    Subjective "I have a bruise on my leg from when I was using my grab bars to get into my shower."  "I didn't fall I just knocked my leg."    Pertinent History pacemaker, A-fib, ICH (04/2013), L trochanteric bursitis, OA   Limitations Walking;House hold activities   Patient Stated Goals "1) Be able to bend over and pick stuff up from the ground without falling over; 2) be able to use my left leg better; 3) get rid of my pain."   Pain Score 4    Pain Location Buttocks   Pain Orientation Left   Pain Descriptors / Indicators Aching   Pain Type Chronic pain   Pain Onset More than a month ago               Therex:  Performed long sitting L IT band stretch x  2 sets of 60 secs.  Pt with somewhat difficulty finding adequate stretch, therefore attempted supine LLE over RLE (see pt instruction) and performed with use of sheet x 2 sets of 60 secs.  Also performed hip abd clam shell x 10 reps, standing hip ext x 10 reps.  All of these added to HEP, see pt instruction for full details.  Also performed standing mini squats x 10 reps facing counter top.  Pt requires max verbal, tactile and demonstration cues for proper technique therefore did not add to HEP.  Pt states that daughter can help her with HEP.    Gait: Performed 32' w/ SPC at S to mod I level with cues for safe sequencing and upright posture throughout.  Performed stair negotiation x 3 reps of 4, 6" steps with use of L rail and SPC to better simulate home entry.  Pt able to perform at S level, however provided cues for improved sequencing to decrease pain in LLE as well as proper placement of  cane when descending stairs.  Will continue to practice as carryover may be difficult due to cognitive deficits.                    PT Education - 04/20/15 1309    Education provided Yes   Education Details Education on HEP, not performing bending over tasks without family supervision for safety.     Person(s) Educated Patient   Methods Explanation   Comprehension Verbalized understanding;Need further instruction          PT Short Term Goals - 04/16/15 1937    PT SHORT TERM GOAL #1   Title Pt will perform initialy HEP with mod I using paper handout to maximize functional gains made in PT. Target date: 05/14/15   PT SHORT TERM GOAL #2   Title Perform DGI to assess/address dynamic gait stability. Target date: 05/14/15   PT SHORT TERM GOAL #3   Title Pt will improve gait velocity from 1.62 ft/sec to >1.8 ft/sec to indicate decreased risk of recurrent falls. Target date: 05/14/15   PT SHORT TERM GOAL #4   Title Pt will negotiate 3 steps with L rail and mod I to enable pt to safely use primary entrance of home. Target date: 05/14/15           PT Long Term Goals - 04/16/15 1947    PT LONG TERM GOAL #1   Title Pt will verbalize understanding of fall prevention strategies within home environment to decrease fall risk. Target date: 06/11/15   PT LONG TERM GOAL #2   Title Pt will decrease 5x Sit-to-Stand time from 20.54 seconds to > 15 seconds to indicate decreased fall risk, improved functional LE strength.  Target date: 06/11/15   PT LONG TERM GOAL #3   Title Pt will improve DGI score by 4 points from baseline to indicate significant improvement in dynamic gait stabiliy.  Target date: 06/11/15   PT LONG TERM GOAL #4   Title Pt will negotiate standard ramp and curb step with mod I using LRAD to indicate safety traversing community obstacles. Target date: 06/11/15   PT LONG TERM GOAL #5   Title Pt will ambulate 500' over unlevel, paved surfaces with mod I using LRAD to  indicate safety with limited community mobility. Target date: 06/11/15   Additional Long Term Goals   Additional Long Term Goals Yes   PT LONG TERM GOAL #6   Title Pt will verbalize understanding of 2  methods of mitigating pain to decrease impact of pain on sleep and improve QoL. Target date: 06/11/15               Plan - 04/20/15 1352    Clinical Impression Statement Skilled session focused on L IT band stretch x 2 various positions, BLE hip abd and hip extension strengthening as well as quad strength and stair training with use of SPC.     Pt will benefit from skilled therapeutic intervention in order to improve on the following deficits Abnormal gait;Decreased range of motion;Impaired flexibility;Decreased knowledge of use of DME;Decreased strength;Decreased mobility;Decreased endurance;Decreased balance;Decreased safety awareness;Decreased cognition;Decreased coordination;Dizziness;Postural dysfunction   Rehab Potential Good   PT Frequency 2x / week   PT Duration 8 weeks   PT Treatment/Interventions ADLs/Self Care Home Management;Neuromuscular re-education;Gait training;Stair training;Patient/family education;Therapeutic activities;Therapeutic exercise;Balance training;DME Instruction;Manual techniques;Energy conservation;Functional mobility training;Vestibular   PT Next Visit Plan Perform DGI, may not be a bad idea to assess floor transfer, also pt (family) concerned with her bending over to retreive objects, please address   PT Home Exercise Plan see pt instruction   Consulted and Agree with Plan of Care Patient        Problem List Patient Active Problem List   Diagnosis Date Noted  . Osteoarthritis of right knee 03/19/2015  . Trochanteric bursitis of left hip 03/19/2015  . Gait disorder 03/19/2015  . Hyperlipidemia 12/08/2014  . Acute right-sided weakness 12/02/2014  . Pulmonary HTN ([place]) 12/02/2014  . DOE (dyspnea on exertion) 12/02/2014  . Chronic diastolic heart  failure, NYHA class 1 ([place]) 12/02/2014  . TIA (transient ischemic attack) 12/02/2014  . Cerebral infarction due to unspecified mechanism   . Myofascial muscle pain 03/20/2014  . Sick sinus syndrome ([place]) 10/19/2013  . Seizure disorder after stroke 07/24/2013  . Syncope 05/30/2013  . Pacemaker 05/30/2013  . Chronic anticoagulation 05/19/2013  . History of ICH (intracerebral hemorrhage) 05/18/2013  . Atrial fibrillation, chronic ([place]) 04/06/2013  . Atherosclerosis of aorta ([place]) 04/06/2013  . Pleural effusion 11/27/2011  . HTN (hypertension) 11/27/2011    Cameron Sprang, PT, MPT [facility] [address] [place], [place], [address] Phone: [phone]   Fax:  [phone] 04/20/2015, 1:54 PM  Name: Ann Kaufman MRN: [id number] Date of Birth: 04/06/1930

## 2015-04-20 NOTE — Patient Instructions (Signed)
   IT Band Stretch- Supine   While laying on your back, place a strap (belt or sheet) around the foot and pull the leg across the body until a stretch is felt on the outer side of the leg. (keeping the leg straight).  Hold this for 60 secs and perform 2 times.  Try to do this twice a day.    Abduction: Clam (Eccentric) - Side-Lying    Lie on side with knees bent. Lift top knee, keeping feet together. Keep trunk steady (try not to let your body rotate backwards or forwards). Slowly lower for 3-5 seconds. _10__ reps per set, _2__ sets per day, _5__ days per week.   http://ecce.exer.us/65   Copyright  VHI. All rights reserved.   EXTENSION: Standing (Active)    Stand at counter top with hands on counter for support.  Stand, both feet flat. Draw right leg behind body as far as possible. Keep knee straight.  Complete _1__ sets of _10__ repetitions. Perform __2_ sessions per day.  http://gtsc.exer.us/77   Copyright  VHI. All rights reserved.   FUNCTIONAL MOBILITY: Marching - Standing    Standing beside counter top with one hand on counter for support.  March in place by lifting left leg up, then right. Alternate. __10_ reps per set, _2__ sets per day, __5_ days per week Hold onto a support.  Copyright  VHI. All rights reserved.

## 2015-04-23 ENCOUNTER — Ambulatory Visit: Payer: Medicare Other | Admitting: Rehabilitation

## 2015-04-23 DIAGNOSIS — R269 Unspecified abnormalities of gait and mobility: Secondary | ICD-10-CM | POA: Diagnosis not present

## 2015-04-23 DIAGNOSIS — R2681 Unsteadiness on feet: Secondary | ICD-10-CM | POA: Diagnosis not present

## 2015-04-23 DIAGNOSIS — M25552 Pain in left hip: Secondary | ICD-10-CM | POA: Diagnosis not present

## 2015-04-23 DIAGNOSIS — M6281 Muscle weakness (generalized): Secondary | ICD-10-CM | POA: Diagnosis not present

## 2015-04-23 NOTE — Patient Instructions (Signed)
Feet Together, Arm Motion - Eyes Closed    Stand in corner with chair in front of you for support.  Stand with eyes closed and feet together. Hold for 30 secs. Do __3__ times per session.    Copyright  VHI. All rights reserved.   Feet Apart (Compliant Surface) Arm Motion - Eyes Open    With eyes open, standing on compliant surface: ___dogs pillow or any other old pillow_____, feet shoulder width apart.  Hold for 30 secs.  Repeat __3__ times per session. Do __1__ sessions per day.  Copyright  VHI. All rights reserved.

## 2015-04-23 NOTE — Therapy (Signed)
[facility] [address] [place], [place], [address] Phone: [phone]   Fax:  [phone]  Physical Therapy Treatment  Patient Details  Name: Ann Kaufman MRN: [id number] Date of Birth: [birth year] Referring Provider: Alger Simons, MD  Encounter Date: 04/23/2015      PT End of Session - 04/23/15 1246    Visit Number 3   Number of Visits 17   Date for PT Re-Evaluation 06/15/15   Authorization Type Medicare primary; [employer] secondary - G Codes every 10 visits   PT Start Time 1100   PT Stop Time 1145   PT Time Calculation (min) 45 min   Equipment Utilized During Treatment Gait belt   Activity Tolerance Patient tolerated treatment well   Behavior During Therapy [facility] for tasks assessed/performed      Past Medical History  Diagnosis Date  . Intracerebral hemorrhage ([place])     a. 04/2013 in setting of xarelto therapy.  . Atrial fibrillation ([place]) 2012    a. Dx in 2012;  b. Rhythm controlled - amiodarone, previously anticoagulated with xarelto (d/c'd 04/2013 in setting of [place]);  b. 04/2013 Echo: EF 55-60%, Gr 1 DD, mild MR, mod dil LA, PAsP 82mmhg  . Hypertension   . Depression   . Arthritis   . Presence of permanent cardiac pacemaker     a. 2012 MDT, placed in [place] (probable tachy-brady).  . Pacemaker   . SSS (sick sinus syndrome) (HCC)     s/p dual chamber pacemaker  . PAF (paroxysmal atrial fibrillation) ([place])   . Aphasia     Occasional aphasia & facial numbness from a left frontal lobe intercerebral hemorrhage on Xarelto on 05/18/13.  Marland Kitchen Pericardial effusion   . Mitral valve prolapse   . Hyperlipidemia   . Rheumatoid arthritis ([place])   . SOB (shortness of breath)     Occasional SOB  . Palpitations     Occasional.  . Peripheral edema     Because she has been standing on her feet more than usual   . Headache     Past Surgical History  Procedure Laterality Date  . Pacemaker insertion      MDT  implanted at [facility]  . Abdominal hysterectomy    . Breast lumpectomy    . Appendectomy    . Bowel resection    . Lead revision  09/17/11    s/p atrial lead revision    There were no vitals filed for this visit.  Visit Diagnosis:  Unsteadiness  Abnormality of gait      Subjective Assessment - 04/23/15 1130    Subjective I have a pourer/strainer in the fridge of water that I'm drinking" Reports being busy with family on Saturday.    Pertinent History pacemaker, A-fib, ICH (04/2013), L trochanteric bursitis, OA   Limitations Walking;House hold activities   Patient Stated Goals "1) Be able to bend over and pick stuff up from the ground without falling over; 2) be able to use my left leg better; 3) get rid of my pain."   Currently in Pain? Yes   Pain Score 5    Pain Location Leg   Pain Orientation Left   Pain Descriptors / Indicators Aching   Pain Type Chronic pain   Pain Onset More than a month ago            Gait:  Addressed gait indoors and outdoors with use of SPC.  Ambulated x 100' x 2 reps indoors to better  simulate home environment (and to ambulate to/from restroom).   Pt able to ambulate at S to mod I level during indoor surfaces, however when outdoors would recommend S with use of SPC, esp on grassy surfaces as she did demonstrate instability and intermittent small LOB.  Provided education to pt regarding need for S during these activities, however feel that carryover may be limited due to cognitive deficits.   NMR:  Worked on corner balance tasks to address SLS and challenge vestibular and visual systems.  Performed SLS with single UE support x 2 sets of 15 secs.  Cues to slowly decrease UE support on chair, however this was somewhat difficult also note difficulty re-directing pt during tasks today due to poor attention.  Progressed to standing with feet apart, EC x 2 reps of 30 secs, feet together EO x 30 secs, feet together EC for 2 reps of 30 secs with increased sway  noted.  Progressed to standing on stacked pillows with feet apart EO x 2 reps of 30 secs, feet apart EC for 2 reps of 30 secs and feet apart EO w/ head turns side/side and up/down.  Note marked instability with head turns, therefore did not add to HEP.  Did add two exercises, see pt instruction for full details.                       PT Education - 04/23/15 1131    Education provided Yes   Education Details Education to have children assist with HEP, added balance activiites. Also education for having family with her when ambulating outdoors.  unsure of carry over due to cognitive deficits.    Person(s) Educated Patient   Methods Explanation   Comprehension Verbalized understanding;Need further instruction          PT Short Term Goals - 04/16/15 1937    PT SHORT TERM GOAL #1   Title Pt will perform initialy HEP with mod I using paper handout to maximize functional gains made in PT. Target date: 05/14/15   PT SHORT TERM GOAL #2   Title Perform DGI to assess/address dynamic gait stability. Target date: 05/14/15   PT SHORT TERM GOAL #3   Title Pt will improve gait velocity from 1.62 ft/sec to >1.8 ft/sec to indicate decreased risk of recurrent falls. Target date: 05/14/15   PT SHORT TERM GOAL #4   Title Pt will negotiate 3 steps with L rail and mod I to enable pt to safely use primary entrance of home. Target date: 05/14/15           PT Long Term Goals - 04/16/15 1947    PT LONG TERM GOAL #1   Title Pt will verbalize understanding of fall prevention strategies within home environment to decrease fall risk. Target date: 06/11/15   PT LONG TERM GOAL #2   Title Pt will decrease 5x Sit-to-Stand time from 20.54 seconds to > 15 seconds to indicate decreased fall risk, improved functional LE strength.  Target date: 06/11/15   PT LONG TERM GOAL #3   Title Pt will improve DGI score by 4 points from baseline to indicate significant improvement in dynamic gait stabiliy.  Target  date: 06/11/15   PT LONG TERM GOAL #4   Title Pt will negotiate standard ramp and curb step with mod I using LRAD to indicate safety traversing community obstacles. Target date: 06/11/15   PT LONG TERM GOAL #5   Title Pt will ambulate 500' over unlevel, paved  surfaces with mod I using LRAD to indicate safety with limited community mobility. Target date: 06/11/15   Additional Long Term Goals   Additional Long Term Goals Yes   PT LONG TERM GOAL #6   Title Pt will verbalize understanding of 2 methods of mitigating pain to decrease impact of pain on sleep and improve QoL. Target date: 06/11/15               Plan - 04/23/15 1247    Clinical Impression Statement Skilled session focused on education on HEP given previous session.  Feel that daughter/granddaughter will likely have to assist for adequate carryover.  Also worked on Theatre manager tasks for vestibular and visual challenges as well as outdoor gait to assess safety.  Added two balance exercises to HEP.  Tolerated well, continue POC.    Pt will benefit from skilled therapeutic intervention in order to improve on the following deficits Abnormal gait;Decreased range of motion;Impaired flexibility;Decreased knowledge of use of DME;Decreased strength;Decreased mobility;Decreased endurance;Decreased balance;Decreased safety awareness;Decreased cognition;Decreased coordination;Dizziness;Postural dysfunction   Rehab Potential Good   PT Frequency 2x / week   PT Duration 8 weeks   PT Treatment/Interventions ADLs/Self Care Home Management;Neuromuscular re-education;Gait training;Stair training;Patient/family education;Therapeutic activities;Therapeutic exercise;Balance training;DME Instruction;Manual techniques;Energy conservation;Functional mobility training;Vestibular   PT Next Visit Plan Perform DGI, may not be a bad idea to assess floor transfer, also pt (family) concerned with her bending over to retreive objects, please address   PT  Home Exercise Plan see pt instruction   Consulted and Agree with Plan of Care Patient        Problem List Patient Active Problem List   Diagnosis Date Noted  . Osteoarthritis of right knee 03/19/2015  . Trochanteric bursitis of left hip 03/19/2015  . Gait disorder 03/19/2015  . Hyperlipidemia 12/08/2014  . Acute right-sided weakness 12/02/2014  . Pulmonary HTN ([place]) 12/02/2014  . DOE (dyspnea on exertion) 12/02/2014  . Chronic diastolic heart failure, NYHA class 1 ([place]) 12/02/2014  . TIA (transient ischemic attack) 12/02/2014  . Cerebral infarction due to unspecified mechanism   . Myofascial muscle pain 03/20/2014  . Sick sinus syndrome ([place]) 10/19/2013  . Seizure disorder after stroke 07/24/2013  . Syncope 05/30/2013  . Pacemaker 05/30/2013  . Chronic anticoagulation 05/19/2013  . History of ICH (intracerebral hemorrhage) 05/18/2013  . Atrial fibrillation, chronic ([place]) 04/06/2013  . Atherosclerosis of aorta ([place]) 04/06/2013  . Pleural effusion 11/27/2011  . HTN (hypertension) 11/27/2011    Cameron Sprang, PT, MPT [facility] [address] [place], [place], [address] Phone: [phone]   Fax:  [phone] 04/23/2015, 12:50 PM  Name: Ann Kaufman MRN: [id number] Date of Birth: [birth year]

## 2015-04-25 ENCOUNTER — Telehealth: Payer: Self-pay | Admitting: Cardiology

## 2015-04-25 ENCOUNTER — Ambulatory Visit (INDEPENDENT_AMBULATORY_CARE_PROVIDER_SITE_OTHER): Payer: Medicare Other | Admitting: *Deleted

## 2015-04-25 DIAGNOSIS — I495 Sick sinus syndrome: Secondary | ICD-10-CM

## 2015-04-25 NOTE — Telephone Encounter (Signed)
Confirmed remote transmission w/ pt daughter.   

## 2015-04-26 ENCOUNTER — Ambulatory Visit: Payer: Medicare Other | Admitting: Physical Therapy

## 2015-04-27 ENCOUNTER — Ambulatory Visit: Payer: Medicare Other | Attending: Physical Medicine & Rehabilitation

## 2015-04-27 DIAGNOSIS — R269 Unspecified abnormalities of gait and mobility: Secondary | ICD-10-CM

## 2015-04-27 DIAGNOSIS — M25552 Pain in left hip: Secondary | ICD-10-CM | POA: Diagnosis not present

## 2015-04-27 DIAGNOSIS — R2681 Unsteadiness on feet: Secondary | ICD-10-CM | POA: Diagnosis not present

## 2015-04-27 DIAGNOSIS — M6281 Muscle weakness (generalized): Secondary | ICD-10-CM

## 2015-04-27 NOTE — Patient Instructions (Signed)
Fall Prevention in the Home  Falls can cause injuries and can affect people from all age groups. There are many simple things that you can do to make your home safe and to help prevent falls. WHAT CAN I DO ON THE OUTSIDE OF MY HOME?  Regularly repair the edges of walkways and driveways and fix any cracks.  Remove high doorway thresholds.  Trim any shrubbery on the main path into your home.  Use bright outdoor lighting.  Clear walkways of debris and clutter, including tools and rocks.  Regularly check that handrails are securely fastened and in good repair. Both sides of any steps should have handrails.  Install guardrails along the edges of any raised decks or porches.  Have leaves, snow, and ice cleared regularly.  Use sand or salt on walkways during winter months.  In the garage, clean up any spills right away, including grease or oil spills. WHAT CAN I DO IN THE BATHROOM?  Use night lights.  Install grab bars by the toilet and in the tub and shower. Do not use towel bars as grab bars.  Use non-skid mats or decals on the floor of the tub or shower.  If you need to sit down while you are in the shower, use a plastic, non-slip stool..  Keep the floor dry. Immediately clean up any water that spills on the floor.  Remove soap buildup in the tub or shower on a regular basis.  Attach bath mats securely with double-sided non-slip rug tape.  Remove throw rugs and other tripping hazards from the floor. WHAT CAN I DO IN THE BEDROOM?  Use night lights.  Make sure that a bedside light is easy to reach.  Do not use oversized bedding that drapes onto the floor.  Have a firm chair that has side arms to use for getting dressed.  Remove throw rugs and other tripping hazards from the floor. WHAT CAN I DO IN THE KITCHEN?   Clean up any spills right away.  Avoid walking on wet floors.  Place frequently used items in easy-to-reach places.  If you need to reach for something  above you, use a sturdy step stool that has a grab bar.  Keep electrical cables out of the way.  Do not use floor polish or wax that makes floors slippery. If you have to use wax, make sure that it is non-skid floor wax.  Remove throw rugs and other tripping hazards from the floor. WHAT CAN I DO IN THE STAIRWAYS?  Do not leave any items on the stairs.  Make sure that there are handrails on both sides of the stairs. Fix handrails that are broken or loose. Make sure that handrails are as long as the stairways.  Check any carpeting to make sure that it is firmly attached to the stairs. Fix any carpet that is loose or worn.  Avoid having throw rugs at the top or bottom of stairways, or secure the rugs with carpet tape to prevent them from moving.  Make sure that you have a light switch at the top of the stairs and the bottom of the stairs. If you do not have them, have them installed. WHAT ARE SOME OTHER FALL PREVENTION TIPS?  Wear closed-toe shoes that fit well and support your feet. Wear shoes that have rubber soles or low heels.  When you use a stepladder, make sure that it is completely opened and that the sides are firmly locked. Have someone hold the ladder while you   are using it. Do not climb a closed stepladder.  Add color or contrast paint or tape to grab bars and handrails in your home. Place contrasting color strips on the first and last steps.  Use mobility aids as needed, such as canes, walkers, scooters, and crutches.  Turn on lights if it is dark. Replace any light bulbs that burn out.  Set up furniture so that there are clear paths. Keep the furniture in the same spot.  Fix any uneven floor surfaces.  Choose a carpet design that does not hide the edge of steps of a stairway.  Be aware of any and all pets.  Review your medicines with your healthcare provider. Some medicines can cause dizziness or changes in blood pressure, which increase your risk of falling. Talk  with your health care provider about other ways that you can decrease your risk of falls. This may include working with a physical therapist or trainer to improve your strength, balance, and endurance.   This information is not intended to replace advice given to you by your health care provider. Make sure you discuss any questions you have with your health care provider.   Document Released: 05/30/2002 Document Revised: 10/24/2014 Document Reviewed: 07/14/2014 Elsevier Interactive Patient Education 2016 [employer]  

## 2015-04-27 NOTE — Progress Notes (Signed)
Remote pacemaker transmission.   

## 2015-04-27 NOTE — Therapy (Signed)
[facility] [address] [place], [place], [address] Phone: [phone]   Fax:  [phone]  Physical Therapy Treatment  Patient Details  Name: Ann Kaufman MRN: [id number] Date of Birth: January 28, 1930 Referring Provider: Alger Simons, MD  Encounter Date: 04/27/2015      PT End of Session - 04/27/15 1345    Visit Number 4   Number of Visits 17   Date for PT Re-Evaluation 06/15/15   Authorization Type Medicare primary; [employer] secondary - G Codes every 10 visits   PT Start Time 1301   PT Stop Time 1341   PT Time Calculation (min) 40 min   Equipment Utilized During Treatment Gait belt   Activity Tolerance Patient tolerated treatment well   Behavior During Therapy [facility] for tasks assessed/performed      Past Medical History  Diagnosis Date  . Intracerebral hemorrhage ([place])     a. 04/2013 in setting of xarelto therapy.  . Atrial fibrillation ([place]) 2012    a. Dx in 2012;  b. Rhythm controlled - amiodarone, previously anticoagulated with xarelto (d/c'd 04/2013 in setting of [place]);  b. 04/2013 Echo: EF 55-60%, Gr 1 DD, mild MR, mod dil LA, PAsP 65mmhg  . Hypertension   . Depression   . Arthritis   . Presence of permanent cardiac pacemaker     a. 2012 MDT, placed in [place] (probable tachy-brady).  . Pacemaker   . SSS (sick sinus syndrome) (HCC)     s/p dual chamber pacemaker  . PAF (paroxysmal atrial fibrillation) ([place])   . Aphasia     Occasional aphasia & facial numbness from a left frontal lobe intercerebral hemorrhage on Xarelto on 05/18/13.  Marland Kitchen Pericardial effusion   . Mitral valve prolapse   . Hyperlipidemia   . Rheumatoid arthritis ([place])   . SOB (shortness of breath)     Occasional SOB  . Palpitations     Occasional.  . Peripheral edema     Because she has been standing on her feet more than usual   . Headache     Past Surgical History  Procedure Laterality Date  . Pacemaker insertion      MDT  implanted at [facility]  . Abdominal hysterectomy    . Breast lumpectomy    . Appendectomy    . Bowel resection    . Lead revision  09/17/11    s/p atrial lead revision    There were no vitals filed for this visit.  Visit Diagnosis:  Abnormality of gait  Muscle weakness (generalized)  Unsteadiness      Subjective Assessment - 04/27/15 1305    Subjective Pt denied falls or changes since last visit. Pt reported her L LE still "gives me trouble" since falling about 1.5 months ago.   Pertinent History pacemaker, A-fib, ICH (04/2013), L trochanteric bursitis, OA   Limitations Walking;House hold activities   Patient Stated Goals "1) Be able to bend over and pick stuff up from the ground without falling over; 2) be able to use my left leg better; 3) get rid of my pain."   Currently in Pain? No/denies     Theract: Pt performed floor transfer on red mat with min-mod A with B UE support on mat for assist. Cues and demonstration for technique. Pt would continue to benefit from performing floor transfers for safety and technique.  Therex: B UE support on counter with min guard-S for safety: Pt performed squats with chair behind her to encourage posterior  wt. Shifting vs.  B knees crossing over toes. 2x10. Pt required one seated rest break and B UE support to perform sit to stand from chair. Cues and demonstration for technique.               Neuro re-ed:     [facility] Adult PT Treatment/Exercise - 04/27/15 1307    Dynamic Gait Index   Level Surface Mild Impairment   Change in Gait Speed Moderate Impairment   Gait with Horizontal Head Turns Moderate Impairment   Gait with Vertical Head Turns Mild Impairment   Gait and Pivot Turn Moderate Impairment   Step Over Obstacle Moderate Impairment   Step Around Obstacles Mild Impairment   Steps Moderate Impairment   Total Score 11           Self care:     PT Education - 04/27/15 1324    Education provided Yes   Education  Details Fall prevention handout. DGI results. PT educated pt on safe floor transfer technique. PT encouraged pt to have family pick items off floor as she reports her knees occasionally buckle when bending down to retrieve items.   Person(s) Educated Patient   Methods Explanation   Comprehension Verbalized understanding;Need further instruction          PT Short Term Goals - 04/27/15 1350    PT SHORT TERM GOAL #1   Title Pt will perform initialy HEP with mod I using paper handout to maximize functional gains made in PT. Target date: 05/14/15   Status On-going   PT SHORT TERM GOAL #2   Title Perform DGI to assess/address dynamic gait stability. Target date: 05/14/15   Status On-going   PT SHORT TERM GOAL #3   Title Pt will improve gait velocity from 1.62 ft/sec to >1.8 ft/sec to indicate decreased risk of recurrent falls. Target date: 05/14/15   Status On-going   PT SHORT TERM GOAL #4   Title Pt will negotiate 3 steps with L rail and mod I to enable pt to safely use primary entrance of home. Target date: 05/14/15   Status On-going           PT Long Term Goals - 04/27/15 1350    PT LONG TERM GOAL #1   Title Pt will verbalize understanding of fall prevention strategies within home environment to decrease fall risk. Target date: 06/11/15   Status On-going   PT LONG TERM GOAL #2   Title Pt will decrease 5x Sit-to-Stand time from 20.54 seconds to > 15 seconds to indicate decreased fall risk, improved functional LE strength.  Target date: 06/11/15   Status On-going   PT LONG TERM GOAL #3   Title Pt will improve DGI score by 4 points from baseline to indicate significant improvement in dynamic gait stabiliy.  Target date: 06/11/15   Status On-going   PT LONG TERM GOAL #4   Title Pt will negotiate standard ramp and curb step with mod I using LRAD to indicate safety traversing community obstacles. Target date: 06/11/15   Status On-going   PT LONG TERM GOAL #5   Title Pt will ambulate  500' over unlevel, paved surfaces with mod I using LRAD to indicate safety with limited community mobility. Target date: 06/11/15   Status On-going   PT LONG TERM GOAL #6   Title Pt will verbalize understanding of 2 methods of mitigating pain to decrease impact of pain on sleep and improve QoL. Target date: 06/11/15   Status On-going  Plan - 04/27/15 1345    Clinical Impression Statement Pt scored a 11/24 on DGI, which indicates pt is at a falls risk. Pt required extensive cues for safety and technique during floor transfer. It might be beneficial to have a family member attend sessions to ensure carryover. Pt would continue to benefit from skilled PT to improve safety during functional mobility.    Pt will benefit from skilled therapeutic intervention in order to improve on the following deficits Abnormal gait;Decreased range of motion;Impaired flexibility;Decreased knowledge of use of DME;Decreased strength;Decreased mobility;Decreased endurance;Decreased balance;Decreased safety awareness;Decreased cognition;Decreased coordination;Dizziness;Postural dysfunction   Rehab Potential Good   PT Frequency 2x / week   PT Duration 8 weeks   PT Treatment/Interventions ADLs/Self Care Home Management;Neuromuscular re-education;Gait training;Kaufman training;Patient/family education;Therapeutic activities;Therapeutic exercise;Balance training;DME Instruction;Manual techniques;Energy conservation;Functional mobility training;Vestibular   PT Next Visit Plan Continue to educate/perform floor transfers, sit<>stand without UE assist to improve LE strength as pt reports her knees buckle when she picks articles off of floor.   PT Home Exercise Plan see pt instruction   Consulted and Agree with Plan of Care Patient        Problem List Patient Active Problem List   Diagnosis Date Noted  . Osteoarthritis of right knee 03/19/2015  . Trochanteric bursitis of left hip 03/19/2015  . Gait  disorder 03/19/2015  . Hyperlipidemia 12/08/2014  . Acute right-sided weakness 12/02/2014  . Pulmonary HTN ([place]) 12/02/2014  . DOE (dyspnea on exertion) 12/02/2014  . Chronic diastolic heart failure, NYHA class 1 ([place]) 12/02/2014  . TIA (transient ischemic attack) 12/02/2014  . Cerebral infarction due to unspecified mechanism   . Myofascial muscle pain 03/20/2014  . Sick sinus syndrome ([place]) 10/19/2013  . Seizure disorder after stroke 07/24/2013  . Syncope 05/30/2013  . Pacemaker 05/30/2013  . Chronic anticoagulation 05/19/2013  . History of ICH (intracerebral hemorrhage) 05/18/2013  . Atrial fibrillation, chronic ([place]) 04/06/2013  . Atherosclerosis of aorta ([place]) 04/06/2013  . Pleural effusion 11/27/2011  . HTN (hypertension) 11/27/2011    Jamian Andujo L 04/27/2015, 1:52 PM  [facility] [address] [place], [place], [address] Phone: [phone]   Fax:  [phone]  Name: ADELISE BUSWELL MRN: [id number] Date of Birth: 03-29-1930    Geoffry Paradise, PT,DPT 04/27/2015 1:52 PM Phone: [phone] Fax: [phone]  Geoffry Paradise, PT,DPT 04/27/2015 1:54 PM Phone: [phone] Fax: [phone]

## 2015-05-01 ENCOUNTER — Ambulatory Visit: Payer: Medicare Other | Admitting: Physical Therapy

## 2015-05-01 DIAGNOSIS — R2681 Unsteadiness on feet: Secondary | ICD-10-CM | POA: Diagnosis not present

## 2015-05-01 DIAGNOSIS — M25552 Pain in left hip: Secondary | ICD-10-CM

## 2015-05-01 DIAGNOSIS — M6281 Muscle weakness (generalized): Secondary | ICD-10-CM | POA: Diagnosis not present

## 2015-05-01 DIAGNOSIS — R269 Unspecified abnormalities of gait and mobility: Secondary | ICD-10-CM

## 2015-05-01 NOTE — Therapy (Signed)
[facility] [address] [place], [place], [address] Phone: [phone]   Fax:  [phone]  Physical Therapy Treatment  Patient Details  Name: Ann Kaufman MRN: [id number] Date of Birth: Jul 25, 1929 Referring Provider: Alger Simons, MD  Encounter Date: 05/01/2015      PT End of Session - 05/01/15 1927    Visit Number 5   Number of Visits 17   Date for PT Re-Evaluation 06/15/15   Authorization Type Medicare primary; [employer] secondary - G Codes every 10 visits   PT Start Time 1318   PT Stop Time 1400   PT Time Calculation (min) 42 min   Activity Tolerance Patient tolerated treatment well   Behavior During Therapy [facility] for tasks assessed/performed      Past Medical History  Diagnosis Date  . Intracerebral hemorrhage ([place])     a. 04/2013 in setting of xarelto therapy.  . Atrial fibrillation ([place]) 2012    a. Dx in 2012;  b. Rhythm controlled - amiodarone, previously anticoagulated with xarelto (d/c'd 04/2013 in setting of [place]);  b. 04/2013 Echo: EF 55-60%, Gr 1 DD, mild MR, mod dil LA, PAsP 4mmhg  . Hypertension   . Depression   . Arthritis   . Presence of permanent cardiac pacemaker     a. 2012 MDT, placed in [place] (probable tachy-brady).  . Pacemaker   . SSS (sick sinus syndrome) (HCC)     s/p dual chamber pacemaker  . PAF (paroxysmal atrial fibrillation) ([place])   . Aphasia     Occasional aphasia & facial numbness from a left frontal lobe intercerebral hemorrhage on Xarelto on 05/18/13.  Marland Kitchen Pericardial effusion   . Mitral valve prolapse   . Hyperlipidemia   . Rheumatoid arthritis ([place])   . SOB (shortness of breath)     Occasional SOB  . Palpitations     Occasional.  . Peripheral edema     Because she has been standing on her feet more than usual   . Headache     Past Surgical History  Procedure Laterality Date  . Pacemaker insertion      MDT implanted at [facility]  . Abdominal hysterectomy    .  Breast lumpectomy    . Appendectomy    . Bowel resection    . Lead revision  09/17/11    s/p atrial lead revision    There were no vitals filed for this visit.  Visit Diagnosis:  Abnormality of gait  Muscle weakness (generalized)  Unsteadiness  Hip pain, left      Subjective Assessment - 05/01/15 1322    Subjective Pt reports that no family members are able to attend PT sessions with patient. Pt denies falls and reports no hip pain prior to PT session.   Pertinent History pacemaker, A-fib, ICH (04/2013), L trochanteric bursitis, OA   Limitations Walking;House hold activities   Patient Stated Goals "1) Be able to bend over and pick stuff up from the ground without falling over; 2) be able to use my left leg better; 3) get rid of my pain."   Currently in Pain? No/denies                         [facility] Adult PT Treatment/Exercise - 05/01/15 0001    Bed Mobility   Bed Mobility Supine to Sit;Sit to Supine   Supine to Sit 6: Modified independent (Device/Increase time)   Sit to Supine 6: Modified independent (Device/Increase  time)   Transfers   Transfers Sit to Stand;Stand to Sit   Sit to Stand 6: Modified independent (Device/Increase time)   Five time sit to stand comments  20.54 sec without UE use   Stand to Sit 6: Modified independent (Device/Increase time)   Ambulation/Gait   Ambulation/Gait Yes   Ambulation/Gait Assistance 6: Modified independent (Device/Increase time);5: Supervision   Ambulation/Gait Assistance Details Mod I due to single R LOB with effective self-recovery   Ambulation Distance (Feet) 800 Feet   Assistive device Straight cane   Gait Pattern Step-through pattern;Left foot flat;Lateral hip instability;Wide base of support;Decreased weight shift to right;Right foot flat;Lateral trunk lean to left   Ambulation Surface Level;Unlevel;Indoor;Outdoor;Paved   Gait velocity --   Stairs Yes   Stairs Assistance 5: Supervision   Stairs Assistance  Details (indicate cue type and reason) With verbal/demonstration cueing from this PT, pt negotiated 4 stairs laterally with BUE support on L rail x2 consecutive trials. During final 5 minutes of session, pt exhibited effective within-session carryover of technique with only subtle cueing from this PT.   Stair Management Technique Step to pattern;One rail Left;Sideways   Number of Stairs 12   Height of Stairs 6   Curb 5: Supervision   Curb Details (indicate cue type and reason) using SPC   Posture/Postural Control   Posture/Postural Control Postural limitations   Postural Limitations Rounded Shoulders;Forward head;Increased thoracic kyphosis;Posterior pelvic tilt;Flexed trunk   Exercises   Exercises Other Exercises   Other Exercises  To address onset of 2/10 pain in L hip after ambulation x800', explained and demonstrated L TFL self-stretch in supported long sitting 4 x30-sec holds. Pt reported decreased pain after said stretch.                PT Education - 05/01/15 1922    Education provided Yes   Education Details Stair negotiation technique.   Person(s) Educated Patient   Methods Explanation;Demonstration;Verbal cues;Handout   Comprehension Verbalized understanding;Returned demonstration;Need further instruction          PT Short Term Goals - 05/01/15 1322    PT SHORT TERM GOAL #1   Title Pt will perform initial HEP with mod I using paper handout to maximize functional gains made in PT. Target date: 05/14/15   Status On-going   PT SHORT TERM GOAL #2   Title Perform DGI to assess/address dynamic gait stability. Target date: 05/14/15   Baseline 11/24   Status Achieved   PT SHORT TERM GOAL #3   Title Pt will improve gait velocity from 1.62 ft/sec to >1.8 ft/sec to indicate decreased risk of recurrent falls. Target date: 05/14/15   Status On-going   PT SHORT TERM GOAL #4   Title Pt will negotiate 3 steps with L rail and mod I to enable pt to safely use primary entrance of  home. Target date: 05/14/15   Status On-going   PT SHORT TERM GOAL #5   Title Pt will perform floor transfer with supervision to increase pt safety with fall recovery. Target date: 05/14/15   Baseline      Status On-going           PT Long Term Goals - 04/27/15 1350    PT LONG TERM GOAL #1   Title Pt will verbalize understanding of fall prevention strategies within home environment to decrease fall risk. Target date: 06/11/15   Status On-going   PT LONG TERM GOAL #2   Title Pt will decrease 5x Sit-to-Stand time from 20.54  seconds to > 15 seconds to indicate decreased fall risk, improved functional LE strength.  Target date: 06/11/15   Status On-going   PT LONG TERM GOAL #3   Title Pt will improve DGI score by 4 points from baseline to indicate significant improvement in dynamic gait stabiliy.  Target date: 06/11/15   Status On-going   PT LONG TERM GOAL #4   Title Pt will negotiate standard ramp and curb step with mod I using LRAD to indicate safety traversing community obstacles. Target date: 06/11/15   Status On-going   PT LONG TERM GOAL #5   Title Pt will ambulate 500' over unlevel, paved surfaces with mod I using LRAD to indicate safety with limited community mobility. Target date: 06/11/15   Status On-going   PT LONG TERM GOAL #6   Title Pt will verbalize understanding of 2 methods of mitigating pain to decrease impact of pain on sleep and improve QoL. Target date: 06/11/15   Status On-going               Plan - 05/01/15 1929    Clinical Impression Statement Session focused on increasing stability/independence with stair negotiation and community ambulation. Pt ambulated x800' consecutively with only 2-point increase in L hip pain. When this PT recommended family member accompany pt to PT sessions, pt declined, stating that family is unable. Therefore, with stair training, focused on blocked practice and use of multiple modes of teaching to promote between-session  carryover of lateral technique. STG added for floor transfer.   Pt will benefit from skilled therapeutic intervention in order to improve on the following deficits Abnormal gait;Decreased range of motion;Impaired flexibility;Decreased knowledge of use of DME;Decreased strength;Decreased mobility;Decreased endurance;Decreased balance;Decreased safety awareness;Decreased cognition;Decreased coordination;Dizziness;Postural dysfunction   Rehab Potential Good   PT Frequency 2x / week   PT Duration 8 weeks   PT Treatment/Interventions ADLs/Self Care Home Management;Neuromuscular re-education;Gait training;Stair training;Patient/family education;Therapeutic activities;Therapeutic exercise;Balance training;DME Instruction;Manual techniques;Energy conservation;Functional mobility training;Vestibular   PT Next Visit Plan Assess between-session carryover of lateral stair negotiation technique. Floor transfer. Sit<>stand without UE assist to improve LE strength as pt reports her knees buckle when she picks articles off of floor.   Consulted and Agree with Plan of Care Patient        Problem List Patient Active Problem List   Diagnosis Date Noted  . Osteoarthritis of right knee 03/19/2015  . Trochanteric bursitis of left hip 03/19/2015  . Gait disorder 03/19/2015  . Hyperlipidemia 12/08/2014  . Acute right-sided weakness 12/02/2014  . Pulmonary HTN ([place]) 12/02/2014  . DOE (dyspnea on exertion) 12/02/2014  . Chronic diastolic heart failure, NYHA class 1 ([place]) 12/02/2014  . TIA (transient ischemic attack) 12/02/2014  . Cerebral infarction due to unspecified mechanism   . Myofascial muscle pain 03/20/2014  . Sick sinus syndrome ([place]) 10/19/2013  . Seizure disorder after stroke 07/24/2013  . Syncope 05/30/2013  . Pacemaker 05/30/2013  . Chronic anticoagulation 05/19/2013  . History of ICH (intracerebral hemorrhage) 05/18/2013  . Atrial fibrillation, chronic ([place]) 04/06/2013  . Atherosclerosis of  aorta ([place]) 04/06/2013  . Pleural effusion 11/27/2011  . HTN (hypertension) 11/27/2011    Billie Ruddy, PT, DPT [facility] [address] [place], [place], [address] Phone: [phone]   Fax:  [phone] 05/01/2015, 7:42 PM   Name: Ann Kaufman MRN: [id number] Date of Birth: 15-Sep-1929

## 2015-05-01 NOTE — Patient Instructions (Addendum)
    When you go up the stairs: - Place both hands on the LEFT rail - Face the LEFT rail - Go up the stairs sideways, one step at a time, leading with your RIGHT leg  When you go up the stairs: - Place both hands on the RIGHT rail - Face the RIGHT rail - Go down the stairs sideways, one step at a time, leading with your LEFT leg  *While you're going up and down the stairs, have your daughter or granddaughter hold your cane.

## 2015-05-03 ENCOUNTER — Ambulatory Visit: Payer: Medicare Other | Admitting: Rehabilitation

## 2015-05-03 ENCOUNTER — Ambulatory Visit (INDEPENDENT_AMBULATORY_CARE_PROVIDER_SITE_OTHER): Payer: Medicare Other | Admitting: Neurology

## 2015-05-03 ENCOUNTER — Encounter: Payer: Self-pay | Admitting: Rehabilitation

## 2015-05-03 ENCOUNTER — Encounter: Payer: Self-pay | Admitting: Neurology

## 2015-05-03 VITALS — BP 122/66 | HR 63 | Ht 64.0 in | Wt 175.8 lb

## 2015-05-03 DIAGNOSIS — I639 Cerebral infarction, unspecified: Secondary | ICD-10-CM

## 2015-05-03 DIAGNOSIS — M6281 Muscle weakness (generalized): Secondary | ICD-10-CM | POA: Diagnosis not present

## 2015-05-03 DIAGNOSIS — M25552 Pain in left hip: Secondary | ICD-10-CM | POA: Diagnosis not present

## 2015-05-03 DIAGNOSIS — R269 Unspecified abnormalities of gait and mobility: Secondary | ICD-10-CM

## 2015-05-03 DIAGNOSIS — R2681 Unsteadiness on feet: Secondary | ICD-10-CM | POA: Diagnosis not present

## 2015-05-03 DIAGNOSIS — R42 Dizziness and giddiness: Secondary | ICD-10-CM

## 2015-05-03 NOTE — Progress Notes (Signed)
PATIENT: Ann Kaufman DOB: Feb 01, 1930  REASON FOR VISIT: routine follow up for ICH HISTORY FROM: patient  HISTORY OF PRESENT ILLNESS: Ann Kaufman is an 81 y.o. female with a history of a ICH on 05/18/2013 while on Xarelto for atrial fibrillation. Lives independently @ baseline. Brought by EMS to [facility] ED as code stroke and seizure. Intubated due to AMS with compromised airway and resp distress. CT head revealed Left frontal lobe intraparenchymal hematoma with intraventricular hemorrhage within the left lateral ventricle as above. Hemorrhagic infarct with intraventricular extension is suspected. No midline shift or hydrocephalus identified. Carotid Duplex Findings suggested 1-39% internal carotid artery stenosis bilaterally. She was with her daughter on 07/24/12 and while the two were walking her daughter noted neurological changes. Patient has a mild aphasia at baseline. Acutely though the patient became unable to speak and was having extensive difficulty getting her words out. Was also noted to have right upper extremity weakness as well. Patient remained ambulatory. Was brought to [facility] where a code stroke was called. Daughter reports that the patient's symptoms lasted about an hour and then resolved completely.  Today is her first visit to the office post-hospital. She is feeling well, back to living independently, with no residual deficits other than mild aphasia, which she had at baseline. BP is well controlled, it is 126/76 in office today. She is tolerating daily aspirin well without side effects, and is tolerating Keppra BID without complication. She has had no recurrent seizures. She has had a couple instances with irregularity in her peripheral vision, which resolved within moments. She has an eye evaluation appointment in 2 weeks.   UPDATE 12/12/13 (LL): Since last visit, patient is doing well.  Her eye evaluation was normal after last visit. She is tolerating daily aspirin well without side  effects, and is tolerating Keppra BID without complication. She has had no recurrent seizures.   Blood pressure is well controlled, it is 127/67 in the office today. Update 04/06/2014 : She returns for followup her last visit 4 months ago. She is a complaint by her daughter. She has new complaint of intermittent headaches. She is a poor historian and the daughter has to fill in most of the details. She has been having intermittent headaches for the last 3 months. These occurred a variable frequency but perhaps at least 2 or 3 times per week. The headaches are mostly in the occipital region but bifrontal also occasionally. She describes this as moderate to severe at 9/10 in severity. Mostly aching in nature but occasionally throbbing. There is not associated nausea ,vomiting  But there is slight light and sound sensitivity and she feels better if she turns down the music and the like. She cannot tell me his physical activity has any relationship with the headache. She does have remote history of migraine headaches in the 1970s but she had outgrown them. She denies any recent fall with head injury though she didn't fall and bruised her leg a month ago. She continues to take Keppra and has not had any breakthrough seizures. Her blood pressure is under good control and is 123/6 for an office today she denies any blurred vision, loss of vision, scalp tenderness, jaw claudication or myalgias or arthralgias. Update 07/13/2014 : She returns for follow-up after last visit 3 months ago accompanied by sister and daughter. She states her headaches are significantly better and she rarely has them and takes Tylenol which seems to work. She has not had any further seizures.  She remains on Keppra as she is tolerating well without any side effects. Had a CT scan of the head on 04/11/14 which I have personally reviewed and does not show any acute abnormalities. Remote age encephalomalacia and gliosis from prior intracerebral  hemorrhage is noted in the left frontal region as well as mild changes of small vessel disease and generalized cerebral atrophy. Lab results from 04/06/14 show normal metabolic panel lab , ESR except marginally elevated creatinine of 1.$RemoveBeforeD'19mg'OZBLYmwZjOrSqk$ % Update 10/31/2014 : She returns for f/u after last visit 4 months ago. She still has mild left leg weakness especially when she first gets up. Her balance remains poor but she ha snot had any falls.She also has occasionally word finding difficulties and mild memory difficulties. She ha snot had any seizures and remains on keppra which she is tolerating well without any side effects.Her blood pressure is well controlled and is 128/77 today. Update 05/03/2015 : She returns for follow-up after last visit 6 months ago. She was briefly admitted to [facility] on 12/02/14 for transient episode of unresponsiveness and presyncopal episode which was followed by transient right facial droop and slurred speech. MRI could not be performed due to pacemaker and CT scan of the head done twice showed only chronic left frontal encephalomalacia without acute abnormality. Transthoracic echo showed normal ejection fraction with grade 1 diastolic dysfunction and mild to moderate left atrial enlargement. Carotid ultrasound showed no significant extracranial stenosis. Hemoglobin A1c was 6.2. LDL cholesterol was 85 mg percent with triglycerides 161 and total cholesterol 176 mg percent. She has noticed since then that she gets lightheaded and dizzy particularly when she gets up suddenly. She is tolerating aspirin well but does get bruising. In fact she fell in the bath tub 3 weeks ago and has a bruise on her left shin. She uses a cane while walking mostly. She's not had any seizures for more than 2 years and remains on Keppra 500 mg twice daily which is tolerating well without side effects. REVIEW OF SYSTEMS: Full 14 system review of systems performed and notable only for:  Eye discharge, runny  nose, constipation, easy bruising and bleeding, headache, joint swelling, back pain, leg pain, leg bruise, walking difficulty , wounds and all other systems negative    ALLERGIES: Allergies  Allergen Reactions  . Latex Other (See Comments)    Reaction: unknown    HOME MEDICATIONS: Outpatient Prescriptions Prior to Visit  Medication Sig Dispense Refill  . amiodarone (PACERONE) 200 MG tablet Take 0.5 tablets (100 mg total) by mouth every morning.    Marland Kitchen aspirin 325 MG EC tablet Take 325 mg by mouth every 6 (six) hours as needed (for headaches).     . diclofenac sodium (VOLTAREN) 1 % GEL Apply 1 application topically 3 (three) times daily. Left knee and hip 3 Tube 4  . famotidine (PEPCID) 20 MG tablet Take 1 tablet (20 mg total) by mouth 2 (two) times daily. (Patient taking differently: Take 20 mg by mouth every other day. )    . folic acid (FOLVITE) 481 MCG tablet Take 400 mcg by mouth every evening.    . furosemide (LASIX) 40 MG tablet Take 40 mg by mouth daily.    Marland Kitchen levETIRAcetam (KEPPRA) 500 MG tablet TAKE 1 TABLET TWICE A DAY (Patient taking differently: TAKE 500 MG BY MOUTH TWICE DAILY) 180 tablet 1  . metoprolol tartrate (LOPRESSOR) 25 MG tablet Take 1 tablet (25 mg total) by mouth 2 (two) times daily. 60 tablet 1  .  omeprazole (PRILOSEC) 20 MG capsule Take 1 capsule (20 mg total) by mouth every morning. (Patient taking differently: Take 20 mg by mouth every morning. ) 30 capsule 1  . potassium chloride SA (K-DUR,KLOR-CON) 20 MEQ tablet Take 1 tablet (20 mEq total) by mouth daily. 30 tablet 3  . senna (SENOKOT) 8.6 MG TABS tablet Take 2 tablets (17.2 mg total) by mouth at bedtime. 120 each 0  . sertraline (ZOLOFT) 50 MG tablet TAKE 1 TABLET AT BEDTIME (Patient taking differently: TAKE 50 MG BY MOUTH AT BEDTIME) 90 tablet 3  . simvastatin (ZOCOR) 10 MG tablet TAKE 1 TABLET AT BEDTIME (NEEDS OFFICE VISIT) (Patient taking differently: TAKE 10 MG BY MOUTH AT BEDTIME) 90 tablet 3  . vitamin  B-12 (CYANOCOBALAMIN) 1000 MCG tablet Take 1 tablet (1,000 mcg total) by mouth every evening. 30 tablet 1  . Vitamin D, Ergocalciferol, (DRISDOL) 50000 UNITS CAPS capsule Take 1 capsule (50,000 Units total) by mouth every 14 (fourteen) days. 4 capsule 1   No facility-administered medications prior to visit.     PHYSICAL EXAM  Filed Vitals:   05/03/15 1032  BP: 122/66  Pulse: 63  Height: $Remove'5\' 4"'LJNGEkB$  (1.626 m)  Weight: 175 lb 12.8 oz (79.742 kg)   Body mass index is 30.16 kg/(m^2). No exam data present  Generalized: Pleasant elderly Caucasian lady, in no acute distress  Head: normocephalic and atraumatic.    Neck: Supple, no carotid bruits  Cardiac: Regular rate rhythm, no murmur  Musculoskeletal: No deformity   Neurological examination  Mentation: Alert oriented to time, place, history taking. Follows all commands speech and language with mild expressive aphasia and word-finding difficulties.  Cranial nerve II-XII: Pupils were equal round reactive to light extraocular movements were full, visual field were full on confrontational test. Facial sensation and strength were normal. hearing was intact to finger rubbing bilaterally. Uvula tongue midline. head turning and shoulder shrug and were normal and symmetric.Tongue protrusion into cheek strength was normal.  Motor: The motor testing reveals 5 over 5 strength of all 4 extremities. Good symmetric motor tone is noted throughout. Diminished fine finger movements on right and orbits left over right upper extremity Sensory: Sensory testing is intact to pinprick, soft touch, vibration sensation, and position sense on all 4 extremities. No evidence of extinction is noted.  Coordination: Cerebellar testing reveals good finger-nose-finger and heel-to-shin bilaterally.  Gait and station: Gait is cautious and slow. Tandem gait is  unable to perform Romberg is negative.   Reflexes: Deep tendon reflexes are symmetric and normal bilaterally. Toes are  downgoing bilaterally.   ASSESSMENT AND PLAN Ann Kaufman is a 81 y.o. female presented with a left frontal intracranial hemorrhage on 05/18/13. The patient was on Xarelto prior to admission. Seizure at the time of presentation. She later presented with aphasia and RUE hemiparesis that resolved on 07/24/12.  Recent episode of TIA in June 2016 in the setting of a presyncopal event  PLAN:   I had a long discussion with the patient with regards to her remote intracerebral hemorrhage, seizures, gait and balance problems and answered questions. I counseled her to get up slowly and and avoid certain movements and to do orthostatic tolerance exercises and to use a cane at all times to avoid falls. We also discussed fall and safety precautions. She was advised to stay on aspirin for stroke prevention and Keppra 500 mg twice daily for seizure prevention. She will return for follow-up in one year or call earlier if necessary.  Antony Contras, MD  05/03/2015, 11:06 AM [facility] [address] [place], [place] [address] [phone]  Note: This document was prepared with digital dictation and possible smart phrase technology. Any transcriptional errors that result from this process are unintentional.

## 2015-05-03 NOTE — Therapy (Signed)
[facility] [address] [place], [place], [address] Phone: [phone]   Fax:  [phone]  Physical Therapy Treatment  Patient Details  Name: Ann Kaufman MRN: [id number] Date of Birth: [birth year] Referring Provider: Alger Simons, MD  Encounter Date: 05/03/2015      PT End of Session - 05/03/15 1322    Visit Number 6   Number of Visits 17   Date for PT Re-Evaluation 06/15/15   Authorization Type Medicare primary; [employer] secondary - G Codes every 10 visits   PT Start Time 1315   PT Stop Time 1400   PT Time Calculation (min) 45 min   Activity Tolerance Patient tolerated treatment well   Behavior During Therapy [facility] for tasks assessed/performed      Past Medical History  Diagnosis Date  . Intracerebral hemorrhage ([place])     a. 04/2013 in setting of xarelto therapy.  . Atrial fibrillation ([place]) 2012    a. Dx in 2012;  b. Rhythm controlled - amiodarone, previously anticoagulated with xarelto (d/c'd 04/2013 in setting of [place]);  b. 04/2013 Echo: EF 55-60%, Gr 1 DD, mild MR, mod dil LA, PAsP 59mhg  . Hypertension   . Depression   . Arthritis   . Presence of permanent cardiac pacemaker     a. 2012 MD[place], [place](probable tachy-brady).  . Pacemaker   . SSS (sick sinus syndrome) ([facility])     s/p dual chamber pacemaker  . PAF (paroxysmal atrial fibrillation) ([place]   . Aphasia     Occasional aphasia & facial numbness from a left frontal lobe intercerebral hemorrhage on Xarelto on 05/18/13.  .Marland KitchenPericardial effusion   . Mitral valve prolapse   . Hyperlipidemia   . [place]umatoid [place] ([place]   . SOB (shortness of breath)     Occasional SOB  . Palpitations     Occasional.  . Peripheral edema     Because she has been standing on her feet more than usual   . Headache     Past Surgical History  Procedure Laterality Date  . Pacemaker insertion      [facility] . Abdominal hysterectomy     . Breast lumpectomy    . Appendectomy    . Bowel resection    . Lead revision  09/17/11    s/p atrial lead revision    There were no vitals filed for this visit.  Visit Diagnosis:  Abnormality of gait  Unsteadiness  Muscle weakness (generalized)  Hip pain, left      Subjective Assessment - 05/03/15 1320    Subjective Pt reports that cane was adjusted last time, however seems slightly short, therefore this PT adjusted slightly higher.  States that daughter brought her but was unable to stay as she had to return to work.    Pertinent History pacemaker, A-fib, ICH (04/2013), L trochanteric bursitis, OA   Limitations Walking;House hold activities   Patient Stated Goals "1) Be able to bend over and pick stuff up from the ground without falling over; 2) be able to use my left leg better; 3) get rid of my pain."   Currently in Pain? No/denies             therex:   Reviewed HEP for compliance and carryover at home.  See pt instruction for details.  Performed all as in HEP, however pt requires max to total cues to recall correct way to do exercise and to actually use  handouts to read what to do during exercises.  Feel that carryover is very limited however with more simple exercises (standing at counter) she was able to better recall and read directions on what to do.    Gait:  Also performed stairs up/down 4, 6" steps with L rail ascending/descending sideways in step to fashion with max cues for letting daughter or granddaughter hold cane.  Requires S for safety at this time for task.  Ended session with 2 laps of gait with use of SPC with head turns side/side and up/down.  Note marked difficulty attending to task and would tend to be distracted internally by her own conversations.                      PT Education - 05/03/15 1638    Education provided Yes   Education Details Continue to educate on HEP and compliance, however feel that carryover is poor due to  cognitive deficits.    Person(s) Educated Patient   Methods Explanation;Handout   Comprehension Verbalized understanding          PT Short Term Goals - 05/03/15 1640    PT SHORT TERM GOAL #1   Title Pt will perform initial HEP with mod I using paper handout to maximize functional gains made in PT. Target date: 05/14/15   Baseline Pt requires max to total cues with use of handout in order to perform properly.  05/03/15   Status Not Met   PT SHORT TERM GOAL #2   Title Perform DGI to assess/address dynamic gait stability. Target date: 05/14/15   Baseline 11/24   Status Achieved   PT SHORT TERM GOAL #3   Title Pt will improve gait velocity from 1.62 ft/sec to >1.8 ft/sec to indicate decreased risk of recurrent falls. Target date: 05/14/15   Baseline 2.45 ft/sec on 05/03/15   Status Achieved   PT SHORT TERM GOAL #4   Title Pt will negotiate 3 steps with L rail and mod I to enable pt to safely use primary entrance of home. Target date: 05/14/15   Status On-going   PT SHORT TERM GOAL #5   Title Pt will perform floor transfer with supervision to increase pt safety with fall recovery. Target date: 05/14/15   Baseline      Status On-going           PT Long Term Goals - 04/27/15 1350    PT LONG TERM GOAL #1   Title Pt will verbalize understanding of fall prevention strategies within home environment to decrease fall risk. Target date: 06/11/15   Status On-going   PT LONG TERM GOAL #2   Title Pt will decrease 5x Sit-to-Stand time from 20.54 seconds to > 15 seconds to indicate decreased fall risk, improved functional LE strength.  Target date: 06/11/15   Status On-going   PT LONG TERM GOAL #3   Title Pt will improve DGI score by 4 points from baseline to indicate significant improvement in dynamic gait stabiliy.  Target date: 06/11/15   Status On-going   PT LONG TERM GOAL #4   Title Pt will negotiate standard ramp and curb step with mod I using LRAD to indicate safety traversing  community obstacles. Target date: 06/11/15   Status On-going   PT LONG TERM GOAL #5   Title Pt will ambulate 500' over unlevel, paved surfaces with mod I using LRAD to indicate safety with limited community mobility. Target date: 06/11/15   Status On-going  PT LONG TERM GOAL #6   Title Pt will verbalize understanding of 2 methods of mitigating pain to decrease impact of pain on sleep and improve QoL. Target date: 06/11/15   Status On-going               Plan - 05/03/15 1322    Clinical Impression Statement Skilled session focused on addressing STG of compliance and performance of HEP.  Pt requires max to total cues to recall most exercises as well as technique.  Feel that poor cognition is biggest limiting factor in pts progress at this time.     Pt will benefit from skilled therapeutic intervention in order to improve on the following deficits Abnormal gait;Decreased range of motion;Impaired flexibility;Decreased knowledge of use of DME;Decreased strength;Decreased mobility;Decreased endurance;Decreased balance;Decreased safety awareness;Decreased cognition;Decreased coordination;Dizziness;Postural dysfunction   Rehab Potential Good   PT Frequency 2x / week   PT Duration 8 weeks   PT Treatment/Interventions ADLs/Self Care Home Management;Neuromuscular re-education;Gait training;Stair training;Patient/family education;Therapeutic activities;Therapeutic exercise;Balance training;DME Instruction;Manual techniques;Energy conservation;Functional mobility training;Vestibular   PT Next Visit Plan Assess between-session carryover of lateral stair negotiation technique. Floor transfer. Sit<>stand without UE assist to improve LE strength as pt reports her knees buckle when she picks articles off of floor.   Consulted and Agree with Plan of Care Patient        Problem List Patient Active Problem List   Diagnosis Date Noted  . Osteoarthritis of right knee 03/19/2015  . Trochanteric bursitis  of left hip 03/19/2015  . Gait disorder 03/19/2015  . Hyperlipidemia 12/08/2014  . Acute right-sided weakness 12/02/2014  . Pulmonary HTN ([place]) 12/02/2014  . DOE (dyspnea on exertion) 12/02/2014  . Chronic diastolic heart failure, NYHA class 1 ([place]) 12/02/2014  . TIA (transient ischemic attack) 12/02/2014  . Cerebral infarction due to unspecified mechanism   . Myofascial muscle pain 03/20/2014  . Sick sinus syndrome ([place]) 10/19/2013  . Seizure disorder after stroke 07/24/2013  . Syncope 05/30/2013  . Pacemaker 05/30/2013  . Chronic anticoagulation 05/19/2013  . History of ICH (intracerebral hemorrhage) 05/18/2013  . Atrial fibrillation, chronic ([place]) 04/06/2013  . Atherosclerosis of aorta ([place]) 04/06/2013  . Pleural effusion 11/27/2011  . HTN (hypertension) 11/27/2011    Cameron Sprang, PT, MPT [facility] [address] [place], [place], [address] Phone: [phone]   Fax:  [phone] 05/03/2015, 4:47 PM  Name: Ann Kaufman MRN: [id number] Date of Birth: September 14, 1929

## 2015-05-03 NOTE — Patient Instructions (Signed)
   IT Band Stretch- Supine   While laying on your back, place a strap (belt or sheet) around the foot and pull the leg across the body until a stretch is felt on the outer side of the leg. (keeping the leg straight).  Hold this for 60 secs and perform 2 times.  Try to do this twice a day.    Abduction: Clam (Eccentric) - Side-Lying    Lie on side with knees bent. Lift top knee, keeping feet together. Keep trunk steady (try not to let your body rotate backwards or forwards). Slowly lower for 3-5 seconds. _10__ reps per set, _2__ sets per day, _5__ days per week.   http://ecce.exer.us/65   Copyright  VHI. All rights reserved.   EXTENSION: Standing (Active)    Stand at counter top with hands on counter for support.  Stand, both feet flat. Draw right leg behind body as far as possible. Keep knee straight.  Complete _1__ sets of _10__ repetitions. Perform __2_ sessions per day.  http://gtsc.exer.us/77   Copyright  VHI. All rights reserved.   FUNCTIONAL MOBILITY: Marching - Standing    Standing beside counter top with one hand on counter for support.  March in place by lifting left leg up, then right. Alternate. __10_ reps per set, _2__ sets per day, __5_ days per week Hold onto a support.  Copyright  VHI. All rights reserved.       

## 2015-05-03 NOTE — Patient Instructions (Signed)
I had a long discussion with the patient with regards to her remote intracerebral hemorrhage, seizures, gait and balance problems and answered questions. I counseled her to get up slowly and and avoid certain movements and to do orthostatic tolerance exercises and to use a cane at all times to avoid falls. We also discussed fall and safety precautions. She was advised to stay on aspirin for stroke prevention and Keppra 500 mg twice daily for seizure prevention. She will return for follow-up in one year or call earlier if necessary. Fall Prevention in the Home  Falls can cause injuries and can affect people from all age groups. There are many simple things that you can do to make your home safe and to help prevent falls. WHAT CAN I DO ON THE OUTSIDE OF MY HOME?  Regularly repair the edges of walkways and driveways and fix any cracks.  Remove high doorway thresholds.  Trim any shrubbery on the main path into your home.  Use bright outdoor lighting.  Clear walkways of debris and clutter, including tools and rocks.  Regularly check that handrails are securely fastened and in good repair. Both sides of any steps should have handrails.  Install guardrails along the edges of any raised decks or porches.  Have leaves, snow, and ice cleared regularly.  Use sand or salt on walkways during winter months.  In the garage, clean up any spills right away, including grease or oil spills. WHAT CAN I DO IN THE BATHROOM?  Use night lights.  Install grab bars by the toilet and in the tub and shower. Do not use towel bars as grab bars.  Use non-skid mats or decals on the floor of the tub or shower.  If you need to sit down while you are in the shower, use a plastic, non-slip stool.Marland Kitchen  Keep the floor dry. Immediately clean up any water that spills on the floor.  Remove soap buildup in the tub or shower on a regular basis.  Attach bath mats securely with double-sided non-slip rug tape.  Remove throw  rugs and other tripping hazards from the floor. WHAT CAN I DO IN THE BEDROOM?  Use night lights.  Make sure that a bedside light is easy to reach.  Do not use oversized bedding that drapes onto the floor.  Have a firm chair that has side arms to use for getting dressed.  Remove throw rugs and other tripping hazards from the floor. WHAT CAN I DO IN THE KITCHEN?   Clean up any spills right away.  Avoid walking on wet floors.  Place frequently used items in easy-to-reach places.  If you need to reach for something above you, use a sturdy step stool that has a grab bar.  Keep electrical cables out of the way.  Do not use floor polish or wax that makes floors slippery. If you have to use wax, make sure that it is non-skid floor wax.  Remove throw rugs and other tripping hazards from the floor. WHAT CAN I DO IN THE STAIRWAYS?  Do not leave any items on the stairs.  Make sure that there are handrails on both sides of the stairs. Fix handrails that are broken or loose. Make sure that handrails are as long as the stairways.  Check any carpeting to make sure that it is firmly attached to the stairs. Fix any carpet that is loose or worn.  Avoid having throw rugs at the top or bottom of stairways, or secure the rugs with  carpet tape to prevent them from moving.  Make sure that you have a light switch at the top of the stairs and the bottom of the stairs. If you do not have them, have them installed. WHAT ARE SOME OTHER FALL PREVENTION TIPS?  Wear closed-toe shoes that fit well and support your feet. Wear shoes that have rubber soles or low heels.  When you use a stepladder, make sure that it is completely opened and that the sides are firmly locked. Have someone hold the ladder while you are using it. Do not climb a closed stepladder.  Add color or contrast paint or tape to grab bars and handrails in your home. Place contrasting color strips on the first and last steps.  Use  mobility aids as needed, such as canes, walkers, scooters, and crutches.  Turn on lights if it is dark. Replace any light bulbs that burn out.  Set up furniture so that there are clear paths. Keep the furniture in the same spot.  Fix any uneven floor surfaces.  Choose a carpet design that does not hide the edge of steps of a stairway.  Be aware of any and all pets.  Review your medicines with your healthcare provider. Some medicines can cause dizziness or changes in blood pressure, which increase your risk of falling. Talk with your health care provider about other ways that you can decrease your risk of falls. This may include working with a physical therapist or trainer to improve your strength, balance, and endurance.   This information is not intended to replace advice given to you by your health care provider. Make sure you discuss any questions you have with your health care provider.   Document Released: 05/30/2002 Document Revised: 10/24/2014 Document Reviewed: 07/14/2014 Elsevier Interactive Patient Education [employer].

## 2015-05-07 ENCOUNTER — Ambulatory Visit: Payer: Medicare Other | Admitting: Physical Therapy

## 2015-05-09 ENCOUNTER — Ambulatory Visit: Payer: Medicare Other | Admitting: Physical Therapy

## 2015-05-09 DIAGNOSIS — R2681 Unsteadiness on feet: Secondary | ICD-10-CM | POA: Diagnosis not present

## 2015-05-09 DIAGNOSIS — M25552 Pain in left hip: Secondary | ICD-10-CM | POA: Diagnosis not present

## 2015-05-09 DIAGNOSIS — M6281 Muscle weakness (generalized): Secondary | ICD-10-CM | POA: Diagnosis not present

## 2015-05-09 DIAGNOSIS — R269 Unspecified abnormalities of gait and mobility: Secondary | ICD-10-CM

## 2015-05-09 NOTE — Therapy (Signed)
[facility] [address] [place], [place], [address] Phone: [phone]   Fax:  [phone]  Physical Therapy Treatment  Patient Details  Name: Ann Kaufman MRN: [id number] Date of Birth: 09-Mar-1930 Referring Provider: Alger Simons, MD  Encounter Date: 05/09/2015      PT End of Session - 05/09/15 1239    Visit Number 7   Number of Visits 17   Date for PT Re-Evaluation 06/15/15   Authorization Type Medicare primary; [employer] secondary - G Codes every 10 visits   PT Start Time 1027   PT Stop Time 1225   PT Time Calculation (min) 39 min   Activity Tolerance Patient tolerated treatment well;No increased pain   Behavior During Therapy [facility] for tasks assessed/performed      Past Medical History  Diagnosis Date  . Intracerebral hemorrhage ([place])     a. 04/2013 in setting of xarelto therapy.  . Atrial fibrillation ([place]) 2012    a. Dx in 2012;  b. Rhythm controlled - amiodarone, previously anticoagulated with xarelto (d/c'd 04/2013 in setting of [place]);  b. 04/2013 Echo: EF 55-60%, Gr 1 DD, mild MR, mod dil LA, PAsP 67mhg  . Hypertension   . Depression   . Arthritis   . Presence of permanent cardiac pacemaker     a. [place]  . Pacemaker   . SSS (sick sinus syndrome) (HCC)     s/p dual chamber pacemaker  . PAF (paroxysmal atrial fibrillation) ([place]   . Aphasia     Occasional aphasia & facial numbness from a left frontal lobe intercerebral hemorrhage on Xarelto on 05/18/13.  .Marland KitchenPericardial effusion   . Mitral valve prolapse   . Hyperlipidemia   . Rheumatoid arthritis ([place]   . SOB (shortness of breath)     Occasional SOB  . Palpitations     Occasional.  . Peripheral edema     Because she has been standing on her feet more than usual   . Headache     Past Surgical History  Procedure Laterality Date  . Pacemaker insertion      [facility] . Abdominal  hysterectomy    . Breast lumpectomy    . Appendectomy    . Bowel resection    . Lead revision  09/17/11    s/p atrial lead revision    There were no vitals filed for this visit.  Visit Diagnosis:  Abnormality of gait  Unsteadiness      Subjective Assessment - 05/09/15 1152    Subjective "I think I'm ready to be done with therapy," per pt. Pt reports her hip pain is "much better", per pt. No hip pain at rest. When asked how she prevents falls in the home, pt replied, 11/16: "Use my cane allthe time; turn slowly; stand for a few seconds after standing up; I've put all furniture all on one wall; I don't climb a ladder anymore; and all my kitchen stuff is in a low cabinet."   Pertinent History pacemaker, A-fib, ICH (04/2013), L trochanteric bursitis, OA   Limitations Walking;House hold activities   Patient Stated Goals "1) Be able to bend over and pick stuff up from the ground without falling over; 2) be able to use my left leg better; 3) get rid of my pain."   Currently in Pain? No/denies  [place] Adult PT Treatment/Exercise - 05/09/15 0001    Transfers   Sit to Stand 7: Independent   Five time sit to stand comments  9.57 seconds  without UE support   Stand to Sit 7: Independent   Floor to Transfer 6: Modified independent (Device/Increase time)  x2 trials   Ambulation/Gait   Ambulation/Gait Yes   Ambulation/Gait Assistance 6: Modified independent (Device/Increase time);5: Supervision   Ambulation/Gait Assistance Details Mod I for majority of gait x500' over unlevel, paved surfaces with SPC; pt did require supervision due to single toe drag when pt turning to speak with this PT.   Ambulation Distance (Feet) 650 Feet   Assistive device Straight cane   Gait Pattern Step-through pattern;Left foot flat;Lateral hip instability;Wide base of support;Decreased weight shift to right;Right foot flat;Lateral trunk lean to left   Ambulation Surface  Level;Unlevel;Indoor;Outdoor;Paved   Stairs Yes   Stairs Assistance 6: Modified independent (Device/Increase time)   Stairs Assistance Details (indicate cue type and reason) No cueing required    Stair Management Technique One rail Left;Step to pattern;Sideways   Number of Stairs 4   Height of Stairs 6   Curb 6: Modified independent (Device/increase time)   Curb Details (indicate cue type and reason) using SPC   Dynamic Gait Index   Level Surface Mild Impairment  used SPC during DGI   Change in Gait Speed Mild Impairment   Gait with Horizontal Head Turns Mild Impairment   Gait with Vertical Head Turns Mild Impairment   Gait and Pivot Turn Mild Impairment   Step Over Obstacle Moderate Impairment   Step Around Obstacles Mild Impairment   Steps Moderate Impairment   Total Score 14                  PT Short Term Goals - 05/09/15 1237    PT SHORT TERM GOAL #1   Title Pt will perform initial HEP with mod I using paper handout to maximize functional gains made in PT. Target date: 05/14/15   Baseline Pt requires max to total cues with use of handout in order to perform properly.  05/03/15   Status Not Met   PT SHORT TERM GOAL #2   Title Perform DGI to assess/address dynamic gait stability. Target date: 05/14/15   Baseline 11/24   Status Achieved   PT SHORT TERM GOAL #3   Title Pt will improve gait velocity from 1.62 ft/sec to >1.8 ft/sec to indicate decreased risk of recurrent falls. Target date: 05/14/15   Baseline 2.45 ft/sec on 05/03/15   Status Achieved   PT SHORT TERM GOAL #4   Title Pt will negotiate 3 steps with L rail and mod I to enable pt to safely use primary entrance of home. Target date: 05/14/15   Baseline Met 11/16.   Status Achieved   PT SHORT TERM GOAL #5   Title Pt will perform floor transfer with supervision to increase pt safety with fall recovery. Target date: 05/14/15   Baseline Met 11/16.   Status Achieved           PT Long Term Goals -  05/09/15 1154    PT LONG TERM GOAL #1   Title Pt will verbalize understanding of fall prevention strategies within home environment to decrease fall risk. Target date: 06/11/15   Baseline Met 11/16. See Subjective section.   Status Achieved   PT LONG TERM GOAL #2   Title Pt will decrease 5x Sit-to-Stand time from 20.54 seconds to < 15  seconds to indicate decreased fall risk, improved functional LE strength.  Target date: 06/11/15   Baseline May 19, 2023: 9.57 seconds without UE use.   Status Achieved   PT LONG TERM GOAL #3   Title Pt will improve DGI score by 4 points from baseline to indicate significant improvement in dynamic gait stabiliy.  Target date: 06/11/15   Baseline Baseline: DGI score = 11/24.      May 19, 2023: DGI score = 14/24   Status Partially Met   PT LONG TERM GOAL #4   Title Pt will negotiate standard ramp and curb step with mod I using LRAD to indicate safety traversing community obstacles. Target date: 06/11/15   Baseline Met 05/19/2023.   Status Achieved   PT LONG TERM GOAL #5   Title Pt will ambulate 500' over unlevel, paved surfaces with mod I using LRAD to indicate safety with limited community mobility. Target date: 06/11/15   Baseline Mod I when not dual tasking; supervision due to single toe drag when dual tasking (talking with this PT).   Status Partially Met   PT LONG TERM GOAL #6   Title Pt will verbalize understanding of 2 methods of mitigating pain to decrease impact of pain on sleep and improve QoL. Target date: 06/11/15   Baseline Met 05-19-2023. "I  could use a salve that the doctor gave me; put on a cold compress; and do the stretches I've learned here."   Status Achieved               Plan - 19-May-2015 1240    Clinical Impression Statement Pt has met 4 of 5  STG's and 4 of 6 LTG's and pt is satisfied with her current functional level; therefore, pt will be discharged from outpatient PT. Pt verbalized understanding and was in full agreement with discharge plan.   Pt  will benefit from skilled therapeutic intervention in order to improve on the following deficits Abnormal gait;Decreased range of motion;Impaired flexibility;Decreased knowledge of use of DME;Decreased strength;Decreased mobility;Decreased endurance;Decreased balance;Decreased safety awareness;Decreased cognition;Decreased coordination;Dizziness;Postural dysfunction   Consulted and Agree with Plan of Care Patient          G-Codes - 05-19-2015 1243    Functional Assessment Tool Used gait velocity 2.45 ft/sec   Functional Limitation Mobility: Walking and moving around   Mobility: Walking and Moving Around Goal Status [phone]) At least 1 percent but less than 20 percent impaired, limited or restricted   Mobility: Walking and Moving Around Discharge Status [phone]) At least 1 percent but less than 20 percent impaired, limited or restricted     PHYSICAL THERAPY DISCHARGE SUMMARY  Visits from Start of Care: 7  Current functional level related to goals / functional outcomes: See above.   Remaining deficits: Pt continues to require supervision for community mobility when dual-tasking. Also, pt requires cueing and use of paper handout to correctly perform home exercises. Pt politely declined when PT recommended family member attend PT session to ensure carryover of HEP.   Education / Equipment: HEP, fall prevention strategies in home, methods of mitigating L hip pain. Plan: Patient agrees to discharge.  Patient goals were met. Patient is being discharged due to being pleased with the current functional level.  ?????and due to pt having met majority of goals.        Problem List Patient Active Problem List   Diagnosis Date Noted  . Osteoarthritis of right knee 03/19/2015  . Trochanteric bursitis of left hip 03/19/2015  . Gait disorder 03/19/2015  .  Hyperlipidemia 12/08/2014  . Acute right-sided weakness 12/02/2014  . Pulmonary HTN ([place]) 12/02/2014  . DOE (dyspnea on exertion) 12/02/2014   . Chronic diastolic heart failure, NYHA class 1 ([place]) 12/02/2014  . TIA (transient ischemic attack) 12/02/2014  . Cerebral infarction due to unspecified mechanism   . Myofascial muscle pain 03/20/2014  . Sick sinus syndrome ([place]) 10/19/2013  . Seizure disorder after stroke 07/24/2013  . Syncope 05/30/2013  . Pacemaker 05/30/2013  . Chronic anticoagulation 05/19/2013  . History of ICH (intracerebral hemorrhage) 05/18/2013  . Atrial fibrillation, chronic ([place]) 04/06/2013  . Atherosclerosis of aorta ([place]) 04/06/2013  . Pleural effusion 11/27/2011  . HTN (hypertension) 11/27/2011   Billie Ruddy, PT, DPT [facility] [address] [place], [place], [address] Phone: [phone]   Fax:  [phone] 05/09/2015, 12:45 PM   Name: Ann Kaufman MRN: [id number] Date of Birth: December 09, 1929

## 2015-05-11 LAB — CUP PACEART INCLINIC DEVICE CHECK
Battery Voltage: 2.79 V
Brady Statistic AP VS Percent: 100 %
Brady Statistic AS VP Percent: 0 %
Brady Statistic AS VS Percent: 0 %
Implantable Lead Implant Date: 20130319
Implantable Lead Implant Date: 20130319
Implantable Lead Location: 753859
Implantable Lead Model: 5076
Implantable Lead Model: 5592
Lead Channel Impedance Value: 481 Ohm
Lead Channel Pacing Threshold Amplitude: 1.625 V
Lead Channel Pacing Threshold Pulse Width: 0.4 ms
Lead Channel Pacing Threshold Pulse Width: 0.4 ms
Lead Channel Sensing Intrinsic Amplitude: 5.6 mV
Lead Channel Setting Pacing Amplitude: 1.5 V
Lead Channel Setting Sensing Sensitivity: 2 mV
MDC IDC LEAD LOCATION: 753860
MDC IDC MSMT BATTERY IMPEDANCE: 356 Ohm
MDC IDC MSMT BATTERY REMAINING LONGEVITY: 105 mo
MDC IDC MSMT LEADCHNL RA IMPEDANCE VALUE: 515 Ohm
MDC IDC MSMT LEADCHNL RA PACING THRESHOLD AMPLITUDE: 0.5 V
MDC IDC SESS DTM: 20161102223141
MDC IDC SET LEADCHNL RV PACING AMPLITUDE: 3.25 V
MDC IDC SET LEADCHNL RV PACING PULSEWIDTH: 0.4 ms
MDC IDC STAT BRADY AP VP PERCENT: 0 %

## 2015-05-14 ENCOUNTER — Ambulatory Visit: Payer: Medicare Other | Admitting: Physical Therapy

## 2015-05-15 ENCOUNTER — Encounter: Payer: Self-pay | Admitting: Cardiology

## 2015-05-16 ENCOUNTER — Ambulatory Visit: Payer: Medicare Other | Admitting: Physical Therapy

## 2015-05-21 ENCOUNTER — Ambulatory Visit: Payer: Medicare Other | Admitting: Physical Therapy

## 2015-05-21 ENCOUNTER — Encounter: Payer: Self-pay | Admitting: Physical Medicine & Rehabilitation

## 2015-05-22 NOTE — Telephone Encounter (Signed)
FYI.Marland KitchenMarland KitchenIs it okay to refill Potassium? Vit D has been rx'ed by another provider.Marland KitchenMarland Kitchen

## 2015-05-23 ENCOUNTER — Encounter: Payer: Self-pay | Admitting: Physical Medicine & Rehabilitation

## 2015-05-23 ENCOUNTER — Encounter: Payer: Medicare Other | Attending: Physical Medicine & Rehabilitation | Admitting: Physical Medicine & Rehabilitation

## 2015-05-23 VITALS — BP 109/63 | HR 79

## 2015-05-23 DIAGNOSIS — I611 Nontraumatic intracerebral hemorrhage in hemisphere, cortical: Secondary | ICD-10-CM | POA: Insufficient documentation

## 2015-05-23 DIAGNOSIS — M7918 Myalgia, other site: Secondary | ICD-10-CM

## 2015-05-23 DIAGNOSIS — I639 Cerebral infarction, unspecified: Secondary | ICD-10-CM | POA: Diagnosis not present

## 2015-05-23 DIAGNOSIS — M1711 Unilateral primary osteoarthritis, right knee: Secondary | ICD-10-CM

## 2015-05-23 DIAGNOSIS — M7062 Trochanteric bursitis, left hip: Secondary | ICD-10-CM

## 2015-05-23 DIAGNOSIS — M797 Fibromyalgia: Secondary | ICD-10-CM

## 2015-05-23 MED ORDER — AMIODARONE HCL 200 MG PO TABS: 100.0000 mg | ORAL_TABLET | ORAL | Status: DC

## 2015-05-23 MED ORDER — METOPROLOL TARTRATE 25 MG PO TABS: 25.0000 mg | ORAL_TABLET | Freq: Two times a day (BID) | ORAL | Status: AC

## 2015-05-23 MED ORDER — SIMVASTATIN 10 MG PO TABS: ORAL_TABLET | ORAL | Status: DC

## 2015-05-23 MED ORDER — VITAMIN B-12 1000 MCG PO TABS: 1000.0000 ug | ORAL_TABLET | Freq: Every evening | ORAL | Status: DC

## 2015-05-23 MED ORDER — SERTRALINE HCL 50 MG PO TABS: ORAL_TABLET | ORAL | Status: AC

## 2015-05-23 MED ORDER — VITAMIN D (ERGOCALCIFEROL) 1.25 MG (50000 UNIT) PO CAPS: 50000.0000 [IU] | ORAL_CAPSULE | ORAL | Status: DC

## 2015-05-23 MED ORDER — FOLIC ACID 400 MCG PO TABS: 400.0000 ug | ORAL_TABLET | Freq: Every evening | ORAL | Status: DC

## 2015-05-23 MED ORDER — LEVETIRACETAM 500 MG PO TABS: ORAL_TABLET | ORAL | Status: DC

## 2015-05-23 NOTE — Progress Notes (Signed)
Subjective:    Patient ID: Ann Kaufman, female    DOB: Jul 11, 1929, 81 y.o.   MRN: [id number]  HPI   Ann Kaufman is here in follow up of her gait disorder and chronic pain. She went to outpt therapy and was discharged as she was at goal level. She is using a straight cane for ambulation. She had one fall when getting out of the tub when her left leg got caught. She has a bar in the shower currently.   She is using [employer] for transportation and doing well with that. In fact she used [employer] to come to the office.   From an exercise standpoint, she is doing exercises almost every day. She doesn't do every day because she's "too lazy".   Her pain is under good control at present.    Pain Inventory Average Pain 0 Pain Right Now 0 My pain is NA  In the last 24 hours, has pain interfered with the following? General activity 0 Relation with others 0 Enjoyment of life 0 What TIME of day is your pain at its worst? NA Sleep (in general) NA  Pain is worse with: NA Pain improves with: Na Relief from Meds: NA  Mobility walk without assistance walk with assistance how many minutes can you walk? 120 use a wheelchair needs help with transfers transfers alone Do you have any goals in this area?  yes  Function not employed: date last employed 04/2013 I need assistance with the following:  shopping  Neuro/Psych No problems in this area  Prior Studies Any changes since last visit?  no  Physicians involved in your care Any changes since last visit?  no   Family History  Problem Relation Age of Onset  . Other      negative for premature CAD.  Marland Kitchen Stroke Father   . Aneurysm Mother    Social History   Social History  . Marital Status: Widowed    Spouse Name: Eddie Dibbles  . Number of Children: 3  . Years of Education: 12   Occupational History  . retired    Social History Main Topics  . Smoking status: Former Smoker    Quit date: 11/26/1968  . Smokeless tobacco: None  . Alcohol  Use: Yes     Comment: wine on occasionally  . Drug Use: Yes    Special: Other-see comments  . Sexual Activity: Not Asked   Other Topics Concern  . None   Social History Narrative   Lives in [place] by herself.  Daughter lives in [place].   Past Surgical History  Procedure Laterality Date  . Pacemaker insertion      MDT implanted at [facility]  . Abdominal hysterectomy    . Breast lumpectomy    . Appendectomy    . Bowel resection    . Lead revision  09/17/11    s/p atrial lead revision   Past Medical History  Diagnosis Date  . Intracerebral hemorrhage ([place])     a. 04/2013 in setting of xarelto therapy.  . Atrial fibrillation ([place]) 2012    a. Dx in 2012;  b. Rhythm controlled - amiodarone, previously anticoagulated with xarelto (d/c'd 04/2013 in setting of [place]);  b. 04/2013 Echo: EF 55-60%, Gr 1 DD, mild MR, mod dil LA, PAsP 81mmhg  . Hypertension   . Depression   . Arthritis   . Presence of permanent cardiac pacemaker     a. 2012 MDT, placed in [place] (probable tachy-brady).  . Pacemaker   .  SSS (sick sinus syndrome) (HCC)     s/p dual chamber pacemaker  . PAF (paroxysmal atrial fibrillation) ([place])   . Aphasia     Occasional aphasia & facial numbness from a left frontal lobe intercerebral hemorrhage on Xarelto on 05/18/13.  Marland Kitchen Pericardial effusion   . Mitral valve prolapse   . Hyperlipidemia   . Rheumatoid arthritis ([place])   . SOB (shortness of breath)     Occasional SOB  . Palpitations     Occasional.  . Peripheral edema     Because she has been standing on her feet more than usual   . Headache    BP 109/63 mmHg  Pulse 79  SpO2 98%  Opioid Risk Score:   Fall Risk Score:  `1  Depression screen PHQ 2/9  No flowsheet data found.   Review of Systems  All other systems reviewed and are negative.      Objective:   Physical Exam  Constitutional: She is oriented to person, place, and time. She appears well-developed and well-nourished.  HENT:  Head:  Normocephalic and atraumatic.  Eyes: Conjunctivae and EOM are normal. Pupils are equal, round, and reactive to light. Right eye exhibits no discharge. Left eye exhibits no discharge. No scleral icterus.  Neck: Normal range of motion. Neck supple.  Cardiovascular: Normal rate and regular rhythm. Exam reveals no gallop and no friction rub.  No murmur heard. No obvious edema on exam.  Respiratory: Effort normal. No respiratory distress. She has no wheezes.  GI: Soft. Bowel sounds are normal. She exhibits no distension. There is no tenderness.  Musculoskeletal: thoracic kyphosis, head forward position. Left knee valgus, mild medial joint line pain. Left greater troch tender with palpation, mild low back tenderness,PSIS tendernes.  Neurological:  Right facial weakness improved. Speech clear. Language is less apraxic. Continued word finding deficits. Comprehension is good. alert. Follows simple commands. Improved insight and awareness. Oriented to place, month, year with cues. Moves all 4's fairly equally now. still struggles with full weight bearing on the right. Tends to lean toward the left still to compensate. No risk of fallingf.  Skin: Skin is warm and dry.  A few limb bruises noted  Psychiatric: pleasant, appropriate, carried a conversation despite language issues. Good insight and awareness overall    Assessment/Plan:  1. Functional deficits secondary to left frontal hematoma with seizure activity  -continue HEP. Reinforced importance of daily exercise as possible    2. Lumbar spondylosis--lidoderm patches prn, tylenol,  3. Left Knee pain--mild OA.  -voltaren gel, pacing, improved mechanics---improved 4. Left hip pain, trochanteric bursitis  -ice, stretching. HEP    Follow up with me in 6 months. Thirty minutes of face to face patient care time were spent during this visit. All questions were encouraged and answered.

## 2015-05-23 NOTE — Patient Instructions (Signed)
PLEASE CALL ME WITH ANY PROBLEMS OR QUESTIONS (#[phone]). HAVE A HAPPY HOLIDAY SEASON!!!    

## 2015-05-24 ENCOUNTER — Ambulatory Visit: Payer: Medicare Other | Admitting: Rehabilitation

## 2015-05-28 ENCOUNTER — Ambulatory Visit: Payer: Medicare Other | Admitting: Physical Therapy

## 2015-05-30 ENCOUNTER — Ambulatory Visit: Payer: Medicare Other | Admitting: Physical Therapy

## 2015-06-04 ENCOUNTER — Ambulatory Visit: Payer: Medicare Other | Admitting: Physical Therapy

## 2015-06-06 ENCOUNTER — Ambulatory Visit: Payer: Medicare Other | Admitting: Rehabilitation

## 2015-06-11 ENCOUNTER — Ambulatory Visit: Payer: Medicare Other | Admitting: Physical Therapy

## 2015-06-13 ENCOUNTER — Ambulatory Visit: Payer: Medicare Other | Admitting: Physical Therapy

## 2015-06-28 DIAGNOSIS — R946 Abnormal results of thyroid function studies: Secondary | ICD-10-CM | POA: Diagnosis not present

## 2015-06-28 DIAGNOSIS — E784 Other hyperlipidemia: Secondary | ICD-10-CM | POA: Diagnosis not present

## 2015-06-28 DIAGNOSIS — N390 Urinary tract infection, site not specified: Secondary | ICD-10-CM | POA: Diagnosis not present

## 2015-06-28 DIAGNOSIS — I10 Essential (primary) hypertension: Secondary | ICD-10-CM | POA: Diagnosis not present

## 2015-06-28 DIAGNOSIS — R8290 Unspecified abnormal findings in urine: Secondary | ICD-10-CM | POA: Diagnosis not present

## 2015-07-04 DIAGNOSIS — E038 Other specified hypothyroidism: Secondary | ICD-10-CM | POA: Diagnosis not present

## 2015-07-04 DIAGNOSIS — M25552 Pain in left hip: Secondary | ICD-10-CM | POA: Diagnosis not present

## 2015-07-04 DIAGNOSIS — Z0000 Encounter for general adult medical examination without abnormal findings: Secondary | ICD-10-CM | POA: Diagnosis not present

## 2015-07-04 DIAGNOSIS — N3281 Overactive bladder: Secondary | ICD-10-CM | POA: Diagnosis not present

## 2015-07-04 DIAGNOSIS — N183 Chronic kidney disease, stage 3 (moderate): Secondary | ICD-10-CM | POA: Diagnosis not present

## 2015-07-04 DIAGNOSIS — R3129 Other microscopic hematuria: Secondary | ICD-10-CM | POA: Diagnosis not present

## 2015-07-04 DIAGNOSIS — Z1389 Encounter for screening for other disorder: Secondary | ICD-10-CM | POA: Diagnosis not present

## 2015-07-04 DIAGNOSIS — I509 Heart failure, unspecified: Secondary | ICD-10-CM | POA: Diagnosis not present

## 2015-07-04 DIAGNOSIS — I129 Hypertensive chronic kidney disease with stage 1 through stage 4 chronic kidney disease, or unspecified chronic kidney disease: Secondary | ICD-10-CM | POA: Diagnosis not present

## 2015-07-25 ENCOUNTER — Encounter: Payer: Medicare Other | Admitting: *Deleted

## 2015-07-25 ENCOUNTER — Telehealth: Payer: Self-pay | Admitting: Cardiology

## 2015-07-25 NOTE — Telephone Encounter (Signed)
LMOVM reminding pt to send remote transmission.   

## 2015-07-30 ENCOUNTER — Encounter: Payer: Self-pay | Admitting: Cardiology

## 2015-08-22 ENCOUNTER — Encounter: Payer: Self-pay | Admitting: *Deleted

## 2015-08-24 ENCOUNTER — Other Ambulatory Visit: Payer: Self-pay | Admitting: Physical Medicine & Rehabilitation

## 2015-08-28 ENCOUNTER — Ambulatory Visit (INDEPENDENT_AMBULATORY_CARE_PROVIDER_SITE_OTHER): Payer: Medicare Other | Admitting: *Deleted

## 2015-08-28 DIAGNOSIS — I495 Sick sinus syndrome: Secondary | ICD-10-CM | POA: Diagnosis not present

## 2015-09-01 ENCOUNTER — Encounter: Payer: Self-pay | Admitting: Internal Medicine

## 2015-09-03 NOTE — Progress Notes (Signed)
Remote pacemaker transmission.   

## 2015-09-06 LAB — CUP PACEART REMOTE DEVICE CHECK
Brady Statistic AP VS Percent: 100 %
Brady Statistic AS VS Percent: 0 %
Implantable Lead Implant Date: 20130319
Implantable Lead Implant Date: 20130319
Lead Channel Impedance Value: 500 Ohm
Lead Channel Pacing Threshold Pulse Width: 0.4 ms
Lead Channel Sensing Intrinsic Amplitude: 5.6 mV
Lead Channel Setting Pacing Amplitude: 1.5 V
Lead Channel Setting Sensing Sensitivity: 2 mV
MDC IDC LEAD LOCATION: 753859
MDC IDC LEAD LOCATION: 753860
MDC IDC LEAD MODEL: 5592
MDC IDC MSMT BATTERY IMPEDANCE: 432 Ohm
MDC IDC MSMT BATTERY REMAINING LONGEVITY: 97 mo
MDC IDC MSMT BATTERY VOLTAGE: 2.79 V
MDC IDC MSMT LEADCHNL RA IMPEDANCE VALUE: 499 Ohm
MDC IDC MSMT LEADCHNL RA PACING THRESHOLD AMPLITUDE: 0.5 V
MDC IDC MSMT LEADCHNL RA PACING THRESHOLD PULSEWIDTH: 0.4 ms
MDC IDC MSMT LEADCHNL RV PACING THRESHOLD AMPLITUDE: 1.375 V
MDC IDC SESS DTM: 20170307232822
MDC IDC SET LEADCHNL RV PACING AMPLITUDE: 2.75 V
MDC IDC SET LEADCHNL RV PACING PULSEWIDTH: 0.46 ms
MDC IDC STAT BRADY AP VP PERCENT: 0 %
MDC IDC STAT BRADY AS VP PERCENT: 0 %

## 2015-09-07 ENCOUNTER — Encounter: Payer: Self-pay | Admitting: Cardiology

## 2015-10-02 DIAGNOSIS — L720 Epidermal cyst: Secondary | ICD-10-CM | POA: Diagnosis not present

## 2015-10-02 DIAGNOSIS — L853 Xerosis cutis: Secondary | ICD-10-CM | POA: Diagnosis not present

## 2015-10-02 DIAGNOSIS — L821 Other seborrheic keratosis: Secondary | ICD-10-CM | POA: Diagnosis not present

## 2015-10-02 DIAGNOSIS — D1801 Hemangioma of skin and subcutaneous tissue: Secondary | ICD-10-CM | POA: Diagnosis not present

## 2015-10-02 DIAGNOSIS — L819 Disorder of pigmentation, unspecified: Secondary | ICD-10-CM | POA: Diagnosis not present

## 2015-10-02 DIAGNOSIS — D2261 Melanocytic nevi of right upper limb, including shoulder: Secondary | ICD-10-CM | POA: Diagnosis not present

## 2015-10-02 DIAGNOSIS — D692 Other nonthrombocytopenic purpura: Secondary | ICD-10-CM | POA: Diagnosis not present

## 2015-10-17 ENCOUNTER — Other Ambulatory Visit: Payer: Self-pay | Admitting: Physical Medicine & Rehabilitation

## 2015-11-13 ENCOUNTER — Encounter: Payer: Medicare Other | Attending: Physical Medicine & Rehabilitation | Admitting: Physical Medicine & Rehabilitation

## 2015-11-13 ENCOUNTER — Encounter: Payer: Self-pay | Admitting: Physical Medicine & Rehabilitation

## 2015-11-13 VITALS — BP 115/70 | HR 72 | Resp 14

## 2015-11-13 DIAGNOSIS — M7061 Trochanteric bursitis, right hip: Secondary | ICD-10-CM | POA: Insufficient documentation

## 2015-11-13 DIAGNOSIS — E785 Hyperlipidemia, unspecified: Secondary | ICD-10-CM | POA: Diagnosis not present

## 2015-11-13 DIAGNOSIS — M1711 Unilateral primary osteoarthritis, right knee: Secondary | ICD-10-CM | POA: Diagnosis not present

## 2015-11-13 DIAGNOSIS — M25562 Pain in left knee: Secondary | ICD-10-CM | POA: Diagnosis not present

## 2015-11-13 DIAGNOSIS — Z87891 Personal history of nicotine dependence: Secondary | ICD-10-CM | POA: Diagnosis not present

## 2015-11-13 DIAGNOSIS — I495 Sick sinus syndrome: Secondary | ICD-10-CM | POA: Insufficient documentation

## 2015-11-13 DIAGNOSIS — R609 Edema, unspecified: Secondary | ICD-10-CM | POA: Insufficient documentation

## 2015-11-13 DIAGNOSIS — I618 Other nontraumatic intracerebral hemorrhage: Secondary | ICD-10-CM | POA: Insufficient documentation

## 2015-11-13 DIAGNOSIS — I341 Nonrheumatic mitral (valve) prolapse: Secondary | ICD-10-CM | POA: Insufficient documentation

## 2015-11-13 DIAGNOSIS — R569 Unspecified convulsions: Secondary | ICD-10-CM | POA: Diagnosis not present

## 2015-11-13 DIAGNOSIS — M47816 Spondylosis without myelopathy or radiculopathy, lumbar region: Secondary | ICD-10-CM | POA: Diagnosis not present

## 2015-11-13 DIAGNOSIS — Z950 Presence of cardiac pacemaker: Secondary | ICD-10-CM | POA: Insufficient documentation

## 2015-11-13 DIAGNOSIS — F329 Major depressive disorder, single episode, unspecified: Secondary | ICD-10-CM | POA: Diagnosis not present

## 2015-11-13 DIAGNOSIS — Z90710 Acquired absence of both cervix and uterus: Secondary | ICD-10-CM | POA: Insufficient documentation

## 2015-11-13 DIAGNOSIS — Z79899 Other long term (current) drug therapy: Secondary | ICD-10-CM | POA: Diagnosis not present

## 2015-11-13 DIAGNOSIS — I10 Essential (primary) hypertension: Secondary | ICD-10-CM | POA: Insufficient documentation

## 2015-11-13 DIAGNOSIS — M25551 Pain in right hip: Secondary | ICD-10-CM | POA: Insufficient documentation

## 2015-11-13 DIAGNOSIS — R4701 Aphasia: Secondary | ICD-10-CM | POA: Insufficient documentation

## 2015-11-13 DIAGNOSIS — I480 Paroxysmal atrial fibrillation: Secondary | ICD-10-CM | POA: Insufficient documentation

## 2015-11-13 DIAGNOSIS — M170 Bilateral primary osteoarthritis of knee: Secondary | ICD-10-CM | POA: Diagnosis not present

## 2015-11-13 DIAGNOSIS — M069 Rheumatoid arthritis, unspecified: Secondary | ICD-10-CM | POA: Diagnosis not present

## 2015-11-13 DIAGNOSIS — M25552 Pain in left hip: Secondary | ICD-10-CM | POA: Insufficient documentation

## 2015-11-13 DIAGNOSIS — R51 Headache: Secondary | ICD-10-CM | POA: Diagnosis not present

## 2015-11-13 DIAGNOSIS — M7062 Trochanteric bursitis, left hip: Secondary | ICD-10-CM

## 2015-11-13 NOTE — Patient Instructions (Signed)
  PLEASE CALL ME WITH ANY PROBLEMS OR QUESTIONS (#[phone]).      

## 2015-11-13 NOTE — Progress Notes (Signed)
Subjective:    Patient ID: Ann Kaufman, female    DOB: 03-Apr-1930, 82 y.o.   MRN: [id number]  HPI   Ann Kaufman is here in follow up of her chronic gait disorder. About a month ago, she aggravated her right hip when reaching and twisting to turn on a lamp. She has had 3 massage sessions have gradually improved the pain. She is doing some exercises in the bedroom for strengthening.  She also uses heat and ice which provides some relief.  She generally uses her cane at and around the home. She goes on short walks. She tries to be more sensible about walking on uneven surfaces. She hasn't fallen. For the most part her judgement has improved.   Her daughter who is with her today remains very involved in her life. They help her at home and assist as needed.    Pain Inventory Average Pain 1 Pain Right Now NA My pain is stabbing  In the last 24 hours, has pain interfered with the following? General activity NA Relation with others NA Enjoyment of life NA What TIME of day is your pain at its worst? daytime Sleep (in general) Fair  Pain is worse with: bending and inactivity Pain improves with: heat/ice Relief from Meds: NA  Mobility use a cane do you drive?  no use a wheelchair needs help with transfers  Function not employed: date last employed NA  Neuro/Psych No problems in this area  Prior Studies Any changes since last visit?  no  Physicians involved in your care Any changes since last visit?  no   Family History  Problem Relation Age of Onset  . Other      negative for premature CAD.  Marland Kitchen Stroke Father   . Aneurysm Mother    Social History   Social History  . Marital Status: Widowed    Spouse Name: Eddie Dibbles  . Number of Children: 3  . Years of Education: 12   Occupational History  . retired    Social History Main Topics  . Smoking status: Former Smoker    Quit date: 11/26/1968  . Smokeless tobacco: None  . Alcohol Use: Yes     Comment: wine on  occasionally  . Drug Use: Yes    Special: Other-see comments  . Sexual Activity: Not Asked   Other Topics Concern  . None   Social History Narrative   Lives in [place] by herself.  Daughter lives in [place].   Past Surgical History  Procedure Laterality Date  . Pacemaker insertion      MDT implanted at [facility]  . Abdominal hysterectomy    . Breast lumpectomy    . Appendectomy    . Bowel resection    . Lead revision  09/17/11    s/p atrial lead revision   Past Medical History  Diagnosis Date  . Intracerebral hemorrhage ([place])     a. 04/2013 in setting of xarelto therapy.  . Atrial fibrillation ([place]) 2012    a. Dx in 2012;  b. Rhythm controlled - amiodarone, previously anticoagulated with xarelto (d/c'd 04/2013 in setting of [place]);  b. 04/2013 Echo: EF 55-60%, Gr 1 DD, mild MR, mod dil LA, PAsP 80mmhg  . Hypertension   . Depression   . Arthritis   . Presence of permanent cardiac pacemaker     a. 2012 MDT, placed in [place] (probable tachy-brady).  . Pacemaker   . SSS (sick sinus syndrome) (HCC)     s/p dual  chamber pacemaker  . PAF (paroxysmal atrial fibrillation) ([place])   . Aphasia     Occasional aphasia & facial numbness from a left frontal lobe intercerebral hemorrhage on Xarelto on 05/18/13.  Marland Kitchen Pericardial effusion   . Mitral valve prolapse   . Hyperlipidemia   . Rheumatoid arthritis ([place])   . SOB (shortness of breath)     Occasional SOB  . Palpitations     Occasional.  . Peripheral edema     Because she has been standing on her feet more than usual   . Headache    BP 115/70 mmHg  Pulse 72  Resp 14  SpO2 95%  Opioid Risk Score:   Fall Risk Score:  `1  Depression screen PHQ 2/9  No flowsheet data found.   Review of Systems  Constitutional: Positive for unexpected weight change.  Hematological: Bruises/bleeds easily.  All other systems reviewed and are negative.      Objective:   Physical Exam  Constitutional: She is oriented to person, place, and  time. She appears well-developed and well-nourished.  HENT:  Head: Normocephalic and atraumatic.  Eyes: Conjunctivae and EOM are normal. Pupils are equal, round, and reactive to light. Right eye exhibits no discharge. Left eye exhibits no discharge. No scleral icterus.  Neck: Normal range of motion. Neck supple.  Cardiovascular: Normal rate and regular rhythm. Exam reveals no gallop and no friction rub.  No murmur heard. No obvious edema on exam.  Respiratory: Effort normal. No respiratory distress. She has no wheezes.  GI: Soft. Bowel sounds are normal. She exhibits no distension. There is no tenderness.  Musculoskeletal: thoracic kyphosis, head forward position. Left knee valgus, mild medial joint line pain. Left greater troch minimally tender with palpation, mild low back tenderness,PSIS tendernes. Right greater troch with tenderness upon palpation but not severe.  Neurological:  Right facial weakness improved. Speech clear. Speech more fluent but occasionally has gaps and word finding problems but overall, very easily communicates needs. Comprehension is good. alert. Follows simple commands. Improved insight and awareness. Oriented to place, month, year with cues. Moves all 4's fairly equally now. still struggles with full weight bearing on the right. Tends to lean toward the left still to compensate. No risk of fallingf.  Skin: Skin is warm and dry.     Psychiatric: pleasant, appropriate, carried a conversation despite language issues. Good insight and awareness overall     Assessment/Plan:  1. Functional deficits secondary to left frontal hematoma with seizure activity  -continue HEP. Reinforced importance of daily exercise as possible with appropriate precautions. 2. Lumbar spondylosis--lidoderm patches prn, tylenol,  3. Left Knee pain--mild OA.  -voltaren gel, pacing, improved mechanics---improved  4. bilateral hip pain, trochanteric bursitis  -ice, heat stretching. HEP. Pain is mild  at this point. Massage is fine  Follow up with me prn. 15 minutes of face to face patient care time were spent during this visit. All questions were encouraged and answered.

## 2015-11-18 ENCOUNTER — Other Ambulatory Visit: Payer: Self-pay | Admitting: Physical Medicine & Rehabilitation

## 2015-11-20 ENCOUNTER — Other Ambulatory Visit: Payer: Self-pay | Admitting: *Deleted

## 2015-11-29 ENCOUNTER — Ambulatory Visit: Payer: Medicare Other | Admitting: *Deleted

## 2015-11-30 ENCOUNTER — Ambulatory Visit (INDEPENDENT_AMBULATORY_CARE_PROVIDER_SITE_OTHER): Payer: Medicare Other | Admitting: *Deleted

## 2015-11-30 ENCOUNTER — Encounter: Payer: Self-pay | Admitting: Cardiology

## 2015-11-30 DIAGNOSIS — I495 Sick sinus syndrome: Secondary | ICD-10-CM

## 2015-12-01 LAB — CUP PACEART REMOTE DEVICE CHECK
Battery Impedance: 457 Ohm
Battery Voltage: 2.79 V
Brady Statistic AP VS Percent: 100 %
Implantable Lead Implant Date: 20130319
Implantable Lead Location: 753860
Implantable Lead Model: 5076
Implantable Lead Model: 5592
Lead Channel Impedance Value: 515 Ohm
Lead Channel Impedance Value: 653 Ohm
Lead Channel Pacing Threshold Amplitude: 0.5 V
Lead Channel Setting Pacing Amplitude: 1.5 V
Lead Channel Setting Pacing Amplitude: 2 V
MDC IDC LEAD IMPLANT DT: 20130319
MDC IDC LEAD LOCATION: 753859
MDC IDC MSMT BATTERY REMAINING LONGEVITY: 94 mo
MDC IDC MSMT LEADCHNL RA PACING THRESHOLD PULSEWIDTH: 0.4 ms
MDC IDC MSMT LEADCHNL RV PACING THRESHOLD AMPLITUDE: 1 V
MDC IDC MSMT LEADCHNL RV PACING THRESHOLD PULSEWIDTH: 0.4 ms
MDC IDC MSMT LEADCHNL RV SENSING INTR AMPL: 5.6 mV
MDC IDC SESS DTM: 20170610004102
MDC IDC SET LEADCHNL RV PACING PULSEWIDTH: 0.4 ms
MDC IDC SET LEADCHNL RV SENSING SENSITIVITY: 2 mV
MDC IDC STAT BRADY AP VP PERCENT: 0 %
MDC IDC STAT BRADY AS VP PERCENT: 0 %
MDC IDC STAT BRADY AS VS PERCENT: 0 %

## 2015-12-04 ENCOUNTER — Encounter: Payer: Self-pay | Admitting: Internal Medicine

## 2015-12-05 NOTE — Progress Notes (Signed)
Remote pacemaker transmission.   

## 2015-12-06 LAB — CUP PACEART REMOTE DEVICE CHECK
Brady Statistic AP VS Percent: 100 %
Brady Statistic AS VP Percent: 0 %
Brady Statistic AS VS Percent: 0 %
Date Time Interrogation Session: 20170610004102
Implantable Lead Location: 753859
Implantable Lead Model: 5076
Implantable Lead Model: 5592
Lead Channel Impedance Value: 653 Ohm
Lead Channel Pacing Threshold Amplitude: 1 V
Lead Channel Pacing Threshold Pulse Width: 0.4 ms
Lead Channel Pacing Threshold Pulse Width: 0.4 ms
Lead Channel Sensing Intrinsic Amplitude: 5.6 mV
Lead Channel Setting Pacing Amplitude: 1.5 V
Lead Channel Setting Sensing Sensitivity: 2 mV
MDC IDC LEAD IMPLANT DT: 20130319
MDC IDC LEAD IMPLANT DT: 20130319
MDC IDC LEAD LOCATION: 753860
MDC IDC MSMT BATTERY IMPEDANCE: 457 Ohm
MDC IDC MSMT BATTERY REMAINING LONGEVITY: 94 mo
MDC IDC MSMT BATTERY VOLTAGE: 2.79 V
MDC IDC MSMT LEADCHNL RA IMPEDANCE VALUE: 515 Ohm
MDC IDC MSMT LEADCHNL RA PACING THRESHOLD AMPLITUDE: 0.5 V
MDC IDC SET LEADCHNL RV PACING AMPLITUDE: 2 V
MDC IDC SET LEADCHNL RV PACING PULSEWIDTH: 0.4 ms
MDC IDC STAT BRADY AP VP PERCENT: 0 %

## 2015-12-11 DIAGNOSIS — N183 Chronic kidney disease, stage 3 (moderate): Secondary | ICD-10-CM | POA: Diagnosis not present

## 2015-12-11 DIAGNOSIS — Z6829 Body mass index (BMI) 29.0-29.9, adult: Secondary | ICD-10-CM | POA: Diagnosis not present

## 2015-12-11 DIAGNOSIS — M79604 Pain in right leg: Secondary | ICD-10-CM | POA: Diagnosis not present

## 2015-12-11 DIAGNOSIS — I10 Essential (primary) hypertension: Secondary | ICD-10-CM | POA: Diagnosis not present

## 2015-12-11 DIAGNOSIS — I872 Venous insufficiency (chronic) (peripheral): Secondary | ICD-10-CM | POA: Diagnosis not present

## 2015-12-11 DIAGNOSIS — M79605 Pain in left leg: Secondary | ICD-10-CM | POA: Diagnosis not present

## 2015-12-12 ENCOUNTER — Encounter: Payer: Self-pay | Admitting: Cardiology

## 2015-12-15 ENCOUNTER — Emergency Department (HOSPITAL_BASED_OUTPATIENT_CLINIC_OR_DEPARTMENT_OTHER)
Admit: 2015-12-15 | Discharge: 2015-12-15 | Disposition: A | Discharge status: Home / Self Care | Payer: Medicare Other | Attending: Emergency Medicine | Admitting: Emergency Medicine

## 2015-12-15 ENCOUNTER — Encounter (HOSPITAL_COMMUNITY): Payer: Self-pay | Admitting: Emergency Medicine

## 2015-12-15 ENCOUNTER — Emergency Department (HOSPITAL_COMMUNITY)
Admission: EM | Admit: 2015-12-15 | Discharge: 2015-12-15 | Disposition: A | Discharge status: Home / Self Care | Payer: Medicare Other | Attending: Emergency Medicine | Admitting: Emergency Medicine

## 2015-12-15 DIAGNOSIS — M79609 Pain in unspecified limb: Secondary | ICD-10-CM

## 2015-12-15 DIAGNOSIS — I619 Nontraumatic intracerebral hemorrhage, unspecified: Secondary | ICD-10-CM | POA: Insufficient documentation

## 2015-12-15 DIAGNOSIS — Z87891 Personal history of nicotine dependence: Secondary | ICD-10-CM | POA: Insufficient documentation

## 2015-12-15 DIAGNOSIS — I341 Nonrheumatic mitral (valve) prolapse: Secondary | ICD-10-CM | POA: Insufficient documentation

## 2015-12-15 DIAGNOSIS — Z950 Presence of cardiac pacemaker: Secondary | ICD-10-CM | POA: Diagnosis not present

## 2015-12-15 DIAGNOSIS — Z79899 Other long term (current) drug therapy: Secondary | ICD-10-CM | POA: Insufficient documentation

## 2015-12-15 DIAGNOSIS — M069 Rheumatoid arthritis, unspecified: Secondary | ICD-10-CM | POA: Insufficient documentation

## 2015-12-15 DIAGNOSIS — E785 Hyperlipidemia, unspecified: Secondary | ICD-10-CM | POA: Insufficient documentation

## 2015-12-15 DIAGNOSIS — Z7982 Long term (current) use of aspirin: Secondary | ICD-10-CM | POA: Insufficient documentation

## 2015-12-15 DIAGNOSIS — M79605 Pain in left leg: Secondary | ICD-10-CM | POA: Diagnosis not present

## 2015-12-15 DIAGNOSIS — I480 Paroxysmal atrial fibrillation: Secondary | ICD-10-CM | POA: Diagnosis not present

## 2015-12-15 DIAGNOSIS — I10 Essential (primary) hypertension: Secondary | ICD-10-CM | POA: Diagnosis not present

## 2015-12-15 DIAGNOSIS — F329 Major depressive disorder, single episode, unspecified: Secondary | ICD-10-CM | POA: Insufficient documentation

## 2015-12-15 NOTE — Discharge Instructions (Signed)

## 2015-12-15 NOTE — ED Provider Notes (Signed)
CSN: [id number]     Arrival date & time 12/15/15  1301 History   First MD Initiated Contact with Patient 12/15/15 1307     Chief Complaint  Patient presents with  . Leg Pain     (Consider location/radiation/quality/duration/timing/severity/associated sxs/prior Treatment) Patient is a 82 y.o. female presenting with leg pain. The history is provided by the patient.  Leg Pain Location:  Leg Time since incident:  2 weeks Injury: no   Leg location:  L lower leg Pain details:    Quality:  Aching   Radiates to:  Does not radiate   Severity:  Moderate   Onset quality:  Gradual   Timing:  Constant   Progression:  Unchanged Chronicity:  New Relieved by:  Nothing Worsened by:  Nothing tried Ineffective treatments:  None tried Associated symptoms: swelling   Associated symptoms: no decreased ROM and no fever     Past Medical History  Diagnosis Date  . Intracerebral hemorrhage ([place])     a. 04/2013 in setting of xarelto therapy.  . Atrial fibrillation ([place]) 2012    a. Dx in 2012;  b. Rhythm controlled - amiodarone, previously anticoagulated with xarelto (d/c'd 04/2013 in setting of [place]);  b. 04/2013 Echo: EF 55-60%, Gr 1 DD, mild MR, mod dil LA, PAsP 1mmhg  . Hypertension   . Depression   . Arthritis   . Presence of permanent cardiac pacemaker     a. 2012 MDT, placed in [place] (probable tachy-brady).  . Pacemaker   . SSS (sick sinus syndrome) (HCC)     s/p dual chamber pacemaker  . PAF (paroxysmal atrial fibrillation) ([place])   . Aphasia     Occasional aphasia & facial numbness from a left frontal lobe intercerebral hemorrhage on Xarelto on 05/18/13.  Marland Kitchen Pericardial effusion   . Mitral valve prolapse   . Hyperlipidemia   . Rheumatoid arthritis ([place])   . SOB (shortness of breath)     Occasional SOB  . Palpitations     Occasional.  . Peripheral edema     Because she has been standing on her feet more than usual   . Headache    Past Surgical History  Procedure Laterality  Date  . Pacemaker insertion      MDT implanted at [facility]  . Abdominal hysterectomy    . Breast lumpectomy    . Appendectomy    . Bowel resection    . Lead revision  09/17/11    s/p atrial lead revision   Family History  Problem Relation Age of Onset  . Other      negative for premature CAD.  Marland Kitchen Stroke Father   . Aneurysm Mother    Social History  Substance Use Topics  . Smoking status: Former Smoker    Quit date: 11/26/1968  . Smokeless tobacco: None  . Alcohol Use: Yes     Comment: wine on occasionally   OB History    No data available     Review of Systems  Constitutional: Negative for fever.  All other systems reviewed and are negative.     Allergies  Review of patient's allergies indicates no known allergies.  Home Medications   Prior to Admission medications   Medication Sig Start Date End Date Taking? Authorizing Provider  amiodarone (PACERONE) 200 MG tablet Take 0.5 tablets (100 mg total) by mouth every morning. 05/23/15  Yes Meredith Staggers, MD  aspirin 325 MG EC tablet Take 325 mg by mouth daily.  Yes Historical Provider, MD  diclofenac sodium (VOLTAREN) 1 % GEL Apply 1 application topically 3 (three) times daily. Left knee and hip Patient taking differently: Apply 1 application topically 3 (three) times daily as needed (PAIN). Left knee and hip 03/19/15  Yes Meredith Staggers, MD  famotidine (PEPCID) 20 MG tablet Take 1 tablet (20 mg total) by mouth 2 (two) times daily. Patient taking differently: Take 20 mg by mouth daily as needed for heartburn or indigestion.  06/08/13  Yes Ivan Anchors Love, PA-C  folic acid (FOLVITE) A999333 MCG tablet Take 1 tablet (400 mcg total) by mouth every evening. 05/23/15  Yes Meredith Staggers, MD  furosemide (LASIX) 40 MG tablet Take 40 mg by mouth daily.   Yes Historical Provider, MD  levETIRAcetam (KEPPRA) 500 MG tablet TAKE 1 TABLET TWICE A DAY Patient taking differently: TAKE 500 MG  TWICE A DAY 11/20/15  Yes Meredith Staggers, MD   metoprolol tartrate (LOPRESSOR) 25 MG tablet Take 1 tablet (25 mg total) by mouth 2 (two) times daily. 05/23/15  Yes Meredith Staggers, MD  potassium chloride SA (K-DUR,KLOR-CON) 20 MEQ tablet Take 1 tablet (20 mEq total) by mouth daily. 03/20/14  Yes Meredith Staggers, MD  senna (SENOKOT) 8.6 MG TABS tablet Take 2 tablets (17.2 mg total) by mouth at bedtime. 06/08/13  Yes Ivan Anchors Love, PA-C  sertraline (ZOLOFT) 50 MG tablet TAKE 50 MG BY MOUTH AT BEDTIME 05/23/15  Yes Meredith Staggers, MD  simvastatin (ZOCOR) 10 MG tablet TAKE 10 MG BY MOUTH AT BEDTIME 05/23/15  Yes Meredith Staggers, MD  vitamin B-12 (CYANOCOBALAMIN) 1000 MCG tablet Take 1 tablet (1,000 mcg total) by mouth every evening. 05/23/15  Yes Meredith Staggers, MD  Vitamin D, Ergocalciferol, (DRISDOL) 50000 units CAPS capsule TAKE 1 CAPSULE EVERY 14 DAYS 08/24/15  Yes Meredith Staggers, MD   BP 111/66 mmHg  Pulse 77  Temp(Src) 97.9 F (36.6 C) (Oral)  Resp 18  SpO2 96% Physical Exam  Constitutional: She is oriented to person, place, and time. She appears well-developed and well-nourished. No distress.  HENT:  Head: Normocephalic.  Eyes: Conjunctivae are normal.  Neck: Neck supple. No tracheal deviation present.  Cardiovascular: Normal rate and regular rhythm.   Pulmonary/Chest: Effort normal. No respiratory distress.  Abdominal: Soft. She exhibits no distension.  Musculoskeletal:       Left lower leg: She exhibits tenderness (anteriorly with small area of fibrocystic soft tissue swelling at level of mid-tibia). She exhibits no swelling and no edema.       Left foot: There is tenderness (medially with overlying ecchymosis).  Neurological: She is alert and oriented to person, place, and time. She has normal strength. Gait normal.  Skin: Skin is warm and dry.  Psychiatric: She has a normal mood and affect.    ED Course  Procedures (including critical care time) Labs Review Labs Reviewed - No data to display  Imaging Review No  results found. I have personally reviewed and evaluated these images and lab results as part of my medical decision-making.   EKG Interpretation None      MDM   Final diagnoses:  Left leg pain    82 year old female presents with pain over her anterior left shin with some swollen areas. She was scheduled for a lower extremity Doppler but is flying out of the country before she can have it scheduled as an outpatient. I have low suspicion clinically for DVT and the areas in question  appeared to be more cystic in nature which is reassuring. The patient has a contraindication to anticoagulation because of hemorrhagic stroke on Coumadin for A. fib so she is currently only on aspirin. Lower extremity Doppler was ordered for rule out. Discussed utility of compression stockings for comfort during prolonged travel situation and continuation of aspirin.    Leo Grosser, MD 12/16/15 [phone]

## 2015-12-15 NOTE — Progress Notes (Signed)
VASCULAR LAB PRELIMINARY  PRELIMINARY  PRELIMINARY  PRELIMINARY  VASCULAR LAB PRELIMINARY  PRELIMINARY  PRELIMINARY  PRELIMINARY  Left lower extremity venous duplex has been completed.    Left:  No evidence of DVT, superficial thrombosis, or Baker's cyst.  Gave patient's nurse to results.  Janifer Adie, RVT, RDMS 12/15/2015, 3:19 PM

## 2015-12-15 NOTE — ED Notes (Signed)
Pt states she was seen at [facility] on Tuesday for nodules in her left leg thinking possible DVT. Pt states  they wanted the pt to have a vascular study done but were not able to get an appt. Pt states she is flying on Monday and wants to get this checked out before. Pt states tender to touch.

## 2016-01-30 ENCOUNTER — Encounter: Payer: Self-pay | Admitting: Internal Medicine

## 2016-02-14 ENCOUNTER — Ambulatory Visit (INDEPENDENT_AMBULATORY_CARE_PROVIDER_SITE_OTHER): Payer: Medicare Other | Admitting: Internal Medicine

## 2016-02-14 ENCOUNTER — Encounter: Payer: Self-pay | Admitting: Internal Medicine

## 2016-02-14 VITALS — BP 124/80 | HR 84 | Ht 64.0 in | Wt 159.4 lb

## 2016-02-14 DIAGNOSIS — I480 Paroxysmal atrial fibrillation: Secondary | ICD-10-CM | POA: Diagnosis not present

## 2016-02-14 DIAGNOSIS — I495 Sick sinus syndrome: Secondary | ICD-10-CM

## 2016-02-14 NOTE — Progress Notes (Signed)
Velna Hatchet, MD: PCP  Ann Kaufman is a 82 y.o. female with a h/o sinus bradycardia sp PPM (MDT) by Dr Saralyn Pilar who presents today to follow-up in the Electrophysiology device clinic.   She is doing well at this time.  Recently went on a cruise.  Remains active for her age. Today, she  denies symptoms of palpitations, chest pain,  , orthopnea, PND, lower extremity edema, dizziness, presyncope, syncope, or neurologic sequela.  The patientis tolerating medications without difficulties and is otherwise without complaint today.   Past Medical History:  Diagnosis Date  . Aphasia    Occasional aphasia & facial numbness from a left frontal lobe intercerebral hemorrhage on Xarelto on 05/18/13.  . Arthritis   . Atrial fibrillation ([place]) 2012   a. Dx in 2012;  b. Rhythm controlled - amiodarone, previously anticoagulated with xarelto (d/c'd 04/2013 in setting of [place]);  b. 04/2013 Echo: EF 55-60%, Gr 1 DD, mild MR, mod dil LA, PAsP 101mmhg  . Depression   . Headache   . Hyperlipidemia   . Hypertension   . Intracerebral hemorrhage ([place])    a. 04/2013 in setting of xarelto therapy.  . Mitral valve prolapse   . Pacemaker   . PAF (paroxysmal atrial fibrillation) ([place])   . Palpitations    Occasional.  . Pericardial effusion   . Peripheral edema    Because she has been standing on her feet more than usual   . Presence of permanent cardiac pacemaker    a. 2012 MDT, placed in [place] (probable tachy-brady).  . Rheumatoid arthritis ([place])   . SOB (shortness of breath)    Occasional SOB  . SSS (sick sinus syndrome) (HCC)    s/p dual chamber pacemaker   Past Surgical History:  Procedure Laterality Date  . ABDOMINAL HYSTERECTOMY    . APPENDECTOMY    . BOWEL RESECTION    . BREAST LUMPECTOMY    . LEAD REVISION  09/17/11   s/p atrial lead revision  . PACEMAKER INSERTION     MDT implanted at [place] History  . Marital status: Widowed    Spouse name: Eddie Dibbles  .  Number of children: 3  . Years of education: 12   Occupational History  . retired    Social History Main Topics  . Smoking status: Former Smoker    Quit date: 11/26/1968  . Smokeless tobacco: Never Used  . Alcohol use Yes     Comment: wine on occasionally  . Drug use:     Types: Other-see comments  . Sexual activity: Not on file   Other Topics Concern  . Not on file   Social History Narrative   Lives in [place] by herself.  Daughter lives in [place].    Family History  Problem Relation Age of Onset  . Stroke Father   . Aneurysm Mother   . Other      negative for premature CAD.    No Known Allergies  Current Outpatient Prescriptions  Medication Sig Dispense Refill  . amiodarone (PACERONE) 200 MG tablet Take 0.5 tablets (100 mg total) by mouth every morning.    Marland Kitchen aspirin 325 MG EC tablet Take 325 mg by mouth daily.     . diclofenac sodium (VOLTAREN) 1 % GEL Apply 1 application topically 3 (three) times daily as needed (pain).    Marland Kitchen ergocalciferol (VITAMIN D2) 50000 units capsule TAKE 1 CAPSULE BY MOUTH EVERY 14 DAYS    .  famotidine (PEPCID) 20 MG tablet Take 1 tablet (20 mg total) by mouth 2 (two) times daily.    . folic acid (FOLVITE) A999333 MCG tablet Take 1 tablet (400 mcg total) by mouth every evening. 30 tablet 4  . furosemide (LASIX) 40 MG tablet Take 40 mg by mouth daily.    Marland Kitchen levETIRAcetam (KEPPRA) 500 MG tablet Take 500 mg by mouth 2 (two) times daily.    . metoprolol tartrate (LOPRESSOR) 25 MG tablet Take 1 tablet (25 mg total) by mouth 2 (two) times daily. 60 tablet 1  . potassium chloride (MICRO-K) 10 MEQ CR capsule Take 1 capsule by mouth daily. q    . senna (SENOKOT) 8.6 MG TABS tablet Take 2 tablets (17.2 mg total) by mouth at bedtime. 120 each 0  . sertraline (ZOLOFT) 50 MG tablet TAKE 50 MG BY MOUTH AT BEDTIME 90 tablet 3  . simvastatin (ZOCOR) 10 MG tablet TAKE 10 MG BY MOUTH AT BEDTIME 90 tablet 3  . vitamin B-12 (CYANOCOBALAMIN) 1000 MCG tablet Take 1 tablet  (1,000 mcg total) by mouth every evening. 30 tablet 1   No current facility-administered medications for this visit.     ROS- all systems are reviewed and negative except as per HPI  Physical Exam: Vitals:   02/14/16 1542  BP: 124/80  Pulse: 84  Weight: 159 lb 6.4 oz (72.3 kg)  Height: 5\' 4"  (1.626 m)    GEN- The patient is elderly appearing, alert and oriented x 3 today.   Head- normocephalic, atraumatic Eyes-  Sclera clear, conjunctiva pink Ears- hearing intact Oropharynx- clear Neck- supple  Lungs- Clear to ausculation bilaterally, normal work of breathing Chest- pacemaker pocket is well healed Heart- Regular rate and rhythm, no murmurs, rubs or gallops, PMI not laterally displaced GI- soft, NT, ND, + BS Extremities- no clubbing, cyanosis, or edema MS- no significant deformity or atrophy Skin- no rash or lesion Psych- euthymic mood, full affect Neuro- strength and sensation are intact  Pacemaker interrogation- reviewed in detail today,  See PACEART report  Assessment and Plan:  1. Sick sinus syndrome Normal pacemaker function See Pace Art report No changes today  2. Atrial fibrillation Maintaining sinus rhythm with amiodarone Not a candidate for anticoagulation due to prior ICH on xarelto No afib in over a year on low dose amiodarone. pcp to follow LFTs/TFTs  3. HTN Stable No change required today  carelink I will see in a year  Thompson Grayer MD, [facility] 02/14/2016 4:05 PM

## 2016-02-14 NOTE — Patient Instructions (Signed)
Medication Instructions:  Your physician recommends that you continue on your current medications as directed. Please refer to the Current Medication list given to you today.   Labwork: None ordered   Testing/Procedures: None ordered   Follow-Up: Your physician wants you to follow-up in: 12 months with Dr Rayann Heman Dennis Bast will receive a reminder letter in the mail two months in advance. If you don't receive a letter, please call our office to schedule the follow-up appointment.   Remote monitoring is used to monitor your Pacemaker from home. This monitoring reduces the number of office visits required to check your device to one time per year. It allows [place] to keep an eye on the functioning of your device to ensure it is working properly. You are scheduled for a device check from home on 05/19/16. You may send your transmission at any time that day. If you have a wireless device, the transmission will be sent automatically. After your physician reviews your transmission, you will receive a postcard with your next transmission date.    Any Other Special Instructions Will Be Listed Below (If Applicable).     If you need a refill on your cardiac medications before your next appointment, please call your pharmacy.

## 2016-02-18 ENCOUNTER — Other Ambulatory Visit: Payer: Self-pay | Admitting: Physical Medicine & Rehabilitation

## 2016-02-21 DIAGNOSIS — R140 Abdominal distension (gaseous): Secondary | ICD-10-CM | POA: Diagnosis not present

## 2016-02-21 DIAGNOSIS — I10 Essential (primary) hypertension: Secondary | ICD-10-CM | POA: Diagnosis not present

## 2016-02-21 DIAGNOSIS — Z6828 Body mass index (BMI) 28.0-28.9, adult: Secondary | ICD-10-CM | POA: Diagnosis not present

## 2016-02-21 DIAGNOSIS — R634 Abnormal weight loss: Secondary | ICD-10-CM | POA: Diagnosis not present

## 2016-02-21 DIAGNOSIS — R109 Unspecified abdominal pain: Secondary | ICD-10-CM | POA: Diagnosis not present

## 2016-02-21 DIAGNOSIS — R748 Abnormal levels of other serum enzymes: Secondary | ICD-10-CM | POA: Diagnosis not present

## 2016-02-21 DIAGNOSIS — K5900 Constipation, unspecified: Secondary | ICD-10-CM | POA: Diagnosis not present

## 2016-02-21 DIAGNOSIS — I509 Heart failure, unspecified: Secondary | ICD-10-CM | POA: Diagnosis not present

## 2016-02-21 DIAGNOSIS — E038 Other specified hypothyroidism: Secondary | ICD-10-CM | POA: Diagnosis not present

## 2016-02-26 DIAGNOSIS — N281 Cyst of kidney, acquired: Secondary | ICD-10-CM | POA: Diagnosis not present

## 2016-02-26 DIAGNOSIS — R140 Abdominal distension (gaseous): Secondary | ICD-10-CM | POA: Diagnosis not present

## 2016-02-26 DIAGNOSIS — R748 Abnormal levels of other serum enzymes: Secondary | ICD-10-CM | POA: Diagnosis not present

## 2016-03-05 ENCOUNTER — Other Ambulatory Visit: Payer: Self-pay | Admitting: Internal Medicine

## 2016-03-05 DIAGNOSIS — R748 Abnormal levels of other serum enzymes: Secondary | ICD-10-CM

## 2016-03-06 DIAGNOSIS — Z961 Presence of intraocular lens: Secondary | ICD-10-CM | POA: Diagnosis not present

## 2016-03-06 DIAGNOSIS — Z0100 Encounter for examination of eyes and vision without abnormal findings: Secondary | ICD-10-CM | POA: Diagnosis not present

## 2016-03-12 ENCOUNTER — Encounter (HOSPITAL_COMMUNITY): Payer: Self-pay | Admitting: Emergency Medicine

## 2016-03-12 ENCOUNTER — Emergency Department (HOSPITAL_COMMUNITY)
Admission: EM | Admit: 2016-03-12 | Discharge: 2016-03-12 | Disposition: A | Discharge status: Home / Self Care | Payer: Medicare Other | Attending: Emergency Medicine | Admitting: Emergency Medicine

## 2016-03-12 ENCOUNTER — Emergency Department (HOSPITAL_COMMUNITY): Payer: Medicare Other

## 2016-03-12 ENCOUNTER — Inpatient Hospital Stay: Admission: RE | Admit: 2016-03-12 | Payer: Medicare Other | Source: Ambulatory Visit

## 2016-03-12 DIAGNOSIS — S92909A Unspecified fracture of unspecified foot, initial encounter for closed fracture: Secondary | ICD-10-CM | POA: Diagnosis not present

## 2016-03-12 DIAGNOSIS — Z950 Presence of cardiac pacemaker: Secondary | ICD-10-CM | POA: Insufficient documentation

## 2016-03-12 DIAGNOSIS — R55 Syncope and collapse: Secondary | ICD-10-CM | POA: Diagnosis not present

## 2016-03-12 DIAGNOSIS — Y939 Activity, unspecified: Secondary | ICD-10-CM | POA: Diagnosis not present

## 2016-03-12 DIAGNOSIS — S0990XA Unspecified injury of head, initial encounter: Secondary | ICD-10-CM | POA: Diagnosis not present

## 2016-03-12 DIAGNOSIS — M25571 Pain in right ankle and joints of right foot: Secondary | ICD-10-CM | POA: Diagnosis not present

## 2016-03-12 DIAGNOSIS — I5032 Chronic diastolic (congestive) heart failure: Secondary | ICD-10-CM | POA: Insufficient documentation

## 2016-03-12 DIAGNOSIS — W19XXXA Unspecified fall, initial encounter: Secondary | ICD-10-CM | POA: Insufficient documentation

## 2016-03-12 DIAGNOSIS — S93401A Sprain of unspecified ligament of right ankle, initial encounter: Secondary | ICD-10-CM

## 2016-03-12 DIAGNOSIS — I110 Hypertensive heart disease with heart failure: Secondary | ICD-10-CM | POA: Insufficient documentation

## 2016-03-12 DIAGNOSIS — Z8673 Personal history of transient ischemic attack (TIA), and cerebral infarction without residual deficits: Secondary | ICD-10-CM | POA: Diagnosis not present

## 2016-03-12 DIAGNOSIS — M7989 Other specified soft tissue disorders: Secondary | ICD-10-CM | POA: Diagnosis not present

## 2016-03-12 DIAGNOSIS — Y999 Unspecified external cause status: Secondary | ICD-10-CM | POA: Diagnosis not present

## 2016-03-12 DIAGNOSIS — S99911A Unspecified injury of right ankle, initial encounter: Secondary | ICD-10-CM | POA: Diagnosis present

## 2016-03-12 DIAGNOSIS — Y9289 Other specified places as the place of occurrence of the external cause: Secondary | ICD-10-CM | POA: Diagnosis not present

## 2016-03-12 DIAGNOSIS — Z79899 Other long term (current) drug therapy: Secondary | ICD-10-CM | POA: Diagnosis not present

## 2016-03-12 DIAGNOSIS — Z7982 Long term (current) use of aspirin: Secondary | ICD-10-CM | POA: Diagnosis not present

## 2016-03-12 DIAGNOSIS — T1490 Injury, unspecified: Secondary | ICD-10-CM | POA: Diagnosis not present

## 2016-03-12 DIAGNOSIS — Z87891 Personal history of nicotine dependence: Secondary | ICD-10-CM | POA: Diagnosis not present

## 2016-03-12 LAB — I-STAT CHEM 8, ED
BUN: 26 mg/dL — AB (ref 6–20)
CHLORIDE: 98 mmol/L — AB (ref 101–111)
CREATININE: 1.2 mg/dL — AB (ref 0.44–1.00)
Calcium, Ion: 1.13 mmol/L — ABNORMAL LOW (ref 1.15–1.40)
Glucose, Bld: 72 mg/dL (ref 65–99)
HCT: 44 % (ref 36.0–46.0)
Hemoglobin: 15 g/dL (ref 12.0–15.0)
Potassium: 4.2 mmol/L (ref 3.5–5.1)
SODIUM: 137 mmol/L (ref 135–145)
TCO2: 30 mmol/L (ref 0–100)

## 2016-03-12 LAB — DIFFERENTIAL
Basophils Absolute: 0 10*3/uL (ref 0.0–0.1)
Basophils Relative: 0 %
EOS ABS: 0.1 10*3/uL (ref 0.0–0.7)
EOS PCT: 1 %
LYMPHS ABS: 2.3 10*3/uL (ref 0.7–4.0)
Lymphocytes Relative: 20 %
MONO ABS: 1.5 10*3/uL — AB (ref 0.1–1.0)
MONOS PCT: 13 %
NEUTROS PCT: 66 %
Neutro Abs: 7.5 10*3/uL (ref 1.7–7.7)

## 2016-03-12 LAB — COMPREHENSIVE METABOLIC PANEL
ALK PHOS: 338 U/L — AB (ref 38–126)
ALT: 28 U/L (ref 14–54)
ANION GAP: 9 (ref 5–15)
AST: 42 U/L — ABNORMAL HIGH (ref 15–41)
Albumin: 3.8 g/dL (ref 3.5–5.0)
BILIRUBIN TOTAL: 0.9 mg/dL (ref 0.3–1.2)
BUN: 18 mg/dL (ref 6–20)
CALCIUM: 9.2 mg/dL (ref 8.9–10.3)
CO2: 27 mmol/L (ref 22–32)
Chloride: 100 mmol/L — ABNORMAL LOW (ref 101–111)
Creatinine, Ser: 1.3 mg/dL — ABNORMAL HIGH (ref 0.44–1.00)
GFR calc Af Amer: 43 mL/min — ABNORMAL LOW (ref >60)
GFR, EST NON AFRICAN AMERICAN: 37 mL/min — AB (ref >60)
Glucose, Bld: 75 mg/dL (ref 65–99)
POTASSIUM: 4.2 mmol/L (ref 3.5–5.1)
Sodium: 136 mmol/L (ref 135–145)
TOTAL PROTEIN: 7 g/dL (ref 6.5–8.1)

## 2016-03-12 LAB — CBC
HEMATOCRIT: 42 % (ref 36.0–46.0)
HEMOGLOBIN: 13.4 g/dL (ref 12.0–15.0)
MCH: 27.6 pg (ref 26.0–34.0)
MCHC: 31.9 g/dL (ref 30.0–36.0)
MCV: 86.4 fL (ref 78.0–100.0)
Platelets: 212 10*3/uL (ref 150–400)
RBC: 4.86 MIL/uL (ref 3.87–5.11)
RDW: 15.4 % (ref 11.5–15.5)
WBC: 11.5 10*3/uL — ABNORMAL HIGH (ref 4.0–10.5)

## 2016-03-12 LAB — PROTIME-INR
INR: 1.13
Prothrombin Time: 14.5 seconds (ref 11.4–15.2)

## 2016-03-12 LAB — CK: Total CK: 76 U/L (ref 38–234)

## 2016-03-12 LAB — APTT: aPTT: 28 seconds (ref 24–36)

## 2016-03-12 LAB — I-STAT TROPONIN, ED: TROPONIN I, POC: 0.01 ng/mL (ref 0.00–0.08)

## 2016-03-12 LAB — CBG MONITORING, ED: GLUCOSE-CAPILLARY: 80 mg/dL (ref 65–99)

## 2016-03-12 NOTE — ED Provider Notes (Signed)
[facility] DEPT Provider Note   CSN: [id number] Arrival date & time: 03/12/16  1511     History   Chief Complaint Chief Complaint  Patient presents with  . Fall    HPI Ann Kaufman is a 82 y.o. female.  HPI Pt comes in from home with cc of fall. Pt has hx of afib, not on blood thinners, HL, SSS s/p pacemaker comes in with cc of fall. PT reports that she found herself on the floor in the middle of the night in the living room. She never made it to the bedroom, as her night dress was still on the bed. She thinks that she must have moved, as in the morning, she found herself in a different location in the living room. At the 2nd time around, she noticed pain in her ankle and was unable to get up. Daughter finally arrived and helped. No headaches,  nausea, vomiting, visual complains, seizures, altered mental status, loss of consciousness, new weakness, or numbness. Pt also denies any incontinence, vision complains or dizziness at rest. No hx of strokes or seizures.   Past Medical History:  Diagnosis Date  . Aphasia    Occasional aphasia & facial numbness from a left frontal lobe intercerebral hemorrhage on Xarelto on 05/18/13.  . Arthritis   . Atrial fibrillation ([place]) 2012   a. Dx in 2012;  b. Rhythm controlled - amiodarone, previously anticoagulated with xarelto (d/c'd 04/2013 in setting of [place]);  b. 04/2013 Echo: EF 55-60%, Gr 1 DD, mild MR, mod dil LA, PAsP 36mmhg  . Depression   . Headache   . Hyperlipidemia   . Hypertension   . Intracerebral hemorrhage ([place])    a. 04/2013 in setting of xarelto therapy.  . Mitral valve prolapse   . Pacemaker   . PAF (paroxysmal atrial fibrillation) ([place])   . Palpitations    Occasional.  . Pericardial effusion   . Peripheral edema    Because she has been standing on her feet more than usual   . Presence of permanent cardiac pacemaker    a. 2012 MDT, placed in [place] (probable tachy-brady).  . Rheumatoid arthritis ([place])   .  SOB (shortness of breath)    Occasional SOB  . SSS (sick sinus syndrome) ([facility])    s/p dual chamber pacemaker    Patient Active Problem List   Diagnosis Date Noted  . Osteoarthritis of right knee 03/19/2015  . Trochanteric bursitis of both hips 03/19/2015  . Gait disorder 03/19/2015  . Hyperlipidemia 12/08/2014  . Acute right-sided weakness 12/02/2014  . Pulmonary HTN ([place]) 12/02/2014  . DOE (dyspnea on exertion) 12/02/2014  . Chronic diastolic heart failure, NYHA class 1 ([place]) 12/02/2014  . TIA (transient ischemic attack) 12/02/2014  . Cerebral infarction due to unspecified mechanism   . Myofascial muscle pain 03/20/2014  . Sick sinus syndrome ([place]) 10/19/2013  . Seizure disorder after stroke 07/24/2013  . Syncope 05/30/2013  . Pacemaker 05/30/2013  . Chronic anticoagulation 05/19/2013  . History of ICH (intracerebral hemorrhage) 05/18/2013  . Atrial fibrillation, chronic ([place]) 04/06/2013  . Atherosclerosis of aorta ([place]) 04/06/2013  . Pleural effusion 11/27/2011  . HTN (hypertension) 11/27/2011    Past Surgical History:  Procedure Laterality Date  . ABDOMINAL HYSTERECTOMY    . APPENDECTOMY    . BOWEL RESECTION    . BREAST LUMPECTOMY    . LEAD REVISION  09/17/11   s/p atrial lead revision  . PACEMAKER INSERTION     MDT implanted at  [facility]    OB History    No data available       Home Medications    Prior to Admission medications   Medication Sig Start Date End Date Taking? Authorizing Provider  amiodarone (PACERONE) 200 MG tablet Take 0.5 tablets (100 mg total) by mouth every morning. 05/23/15  Yes Meredith Staggers, MD  aspirin 325 MG EC tablet Take 325 mg by mouth daily.    Yes Historical Provider, MD  diclofenac sodium (VOLTAREN) 1 % GEL Apply 1 application topically 3 (three) times daily as needed (pain).   Yes Historical Provider, MD  ergocalciferol (VITAMIN D2) 50000 units capsule TAKE 1 CAPSULE BY MOUTH EVERY 14 DAYS   Yes Historical Provider, MD    famotidine (PEPCID) 20 MG tablet Take 1 tablet (20 mg total) by mouth 2 (two) times daily. 06/08/13  Yes Ivan Anchors Love, PA-C  folic acid (FOLVITE) A999333 MCG tablet Take 1 tablet (400 mcg total) by mouth every evening. 05/23/15  Yes Meredith Staggers, MD  furosemide (LASIX) 40 MG tablet Take 40 mg by mouth daily.   Yes Historical Provider, MD  levETIRAcetam (KEPPRA) 500 MG tablet TAKE 1 TABLET TWICE A DAY 02/18/16  Yes Meredith Staggers, MD  metoprolol tartrate (LOPRESSOR) 25 MG tablet Take 1 tablet (25 mg total) by mouth 2 (two) times daily. 05/23/15  Yes Meredith Staggers, MD  potassium chloride (MICRO-K) 10 MEQ CR capsule Take 1 capsule by mouth daily. q 01/14/16  Yes Historical Provider, MD  senna (SENOKOT) 8.6 MG TABS tablet Take 2 tablets (17.2 mg total) by mouth at bedtime. 06/08/13  Yes Ivan Anchors Love, PA-C  sertraline (ZOLOFT) 50 MG tablet TAKE 50 MG BY MOUTH AT BEDTIME 05/23/15  Yes Meredith Staggers, MD  simvastatin (ZOCOR) 10 MG tablet TAKE 10 MG BY MOUTH AT BEDTIME 05/23/15  Yes Meredith Staggers, MD  vitamin B-12 (CYANOCOBALAMIN) 1000 MCG tablet Take 1 tablet (1,000 mcg total) by mouth every evening. 05/23/15  Yes Meredith Staggers, MD    Family History Family History  Problem Relation Age of Onset  . Stroke Father   . Aneurysm Mother   . Other      negative for premature CAD.    Social History Social History  Substance Use Topics  . Smoking status: Former Smoker    Quit date: 11/26/1968  . Smokeless tobacco: Never Used  . Alcohol use Yes     Comment: wine on occasionally     Allergies   Review of patient's allergies indicates no known allergies.   Review of Systems Review of Systems   ROS 10 Systems reviewed and are negative for acute change except as noted in the HPI.     Physical Exam Updated Vital Signs BP 109/65   Pulse 85   Temp 97.4 F (36.3 C) (Oral)   Resp 16   SpO2 100%   Physical Exam  Constitutional: She is oriented to person, place, and time. She  appears well-developed.  HENT:  Head: Normocephalic and atraumatic.  Eyes: EOM are normal.  Neck: Normal range of motion. Neck supple.  Cardiovascular: Normal rate and regular rhythm.   No murmur heard. Pulmonary/Chest: Effort normal.  Abdominal: Soft. Bowel sounds are normal. There is no tenderness.  Musculoskeletal:  Right ankle swelling and bruising. Tenderness over the lateral malleoli.  Neurological: She is alert and oriented to person, place, and time. No cranial nerve deficit.  Cerebellar exam is normal (finger to nose) Sensory exam  normal for bilateral upper and lower extremities - and patient is able to discriminate between sharp and dull. Motor exam is 4+/5   Skin: Skin is warm and dry. Rash noted.  Nursing note and vitals reviewed.    ED Treatments / Results  Labs (all labs ordered are listed, but only abnormal results are displayed) Labs Reviewed  CBC - Abnormal; Notable for the following:       Result Value   WBC 11.5 (*)    All other components within normal limits  DIFFERENTIAL - Abnormal; Notable for the following:    Monocytes Absolute 1.5 (*)    All other components within normal limits  COMPREHENSIVE METABOLIC PANEL - Abnormal; Notable for the following:    Chloride 100 (*)    Creatinine, Ser 1.30 (*)    AST 42 (*)    Alkaline Phosphatase 338 (*)    GFR calc non Af Amer 37 (*)    GFR calc Af Amer 43 (*)    All other components within normal limits  I-STAT CHEM 8, ED - Abnormal; Notable for the following:    Chloride 98 (*)    BUN 26 (*)    Creatinine, Ser 1.20 (*)    Calcium, Ion 1.13 (*)    All other components within normal limits  PROTIME-INR  APTT  CK  URINALYSIS, ROUTINE W REFLEX MICROSCOPIC (NOT AT [facility])  I-STAT TROPOININ, ED  CBG MONITORING, ED    EKG  EKG Interpretation  Date/Time:  Wednesday March 12 2016 15:37:12 EDT Ventricular Rate:  63 PR Interval:  204 QRS Duration: 86 QT Interval:  454 QTC Calculation: 464 R  Axis:   -65 Text Interpretation:  Atrial-paced rhythm Left axis deviation Septal infarct , age undetermined Possible Lateral infarct , age undetermined Abnormal ECG Nonspecific T wave abnormality No acute changes Confirmed by Kathrynn Humble, MD, Thel[facility] Comp [phone]) on 03/12/2016 6:28:29 PM       Radiology Dg Ankle Complete Right  Result Date: 03/12/2016 CLINICAL DATA:  Fall with right ankle pain and swelling. Initial encounter. EXAM: RIGHT ANKLE - COMPLETE 3+ VIEW COMPARISON:  05/28/2013 FINDINGS: Soft tissue swelling greatest about the lateral malleolus. On AP view there is subtle lateral malleolus cortical lucency but on the oblique view no fracture is noted. No dislocation. Heel, medial malleolus, and talonavicular spurs.  Osteopenia. IMPRESSION: Soft tissue swelling without acute osseous finding. Electronically Signed   By: Monte Fantasia M.D.   On: 03/12/2016 16:06   Ct Head Wo Contrast  Result Date: 03/12/2016 CLINICAL DATA:  [place].  Hit head. EXAM: CT HEAD WITHOUT CONTRAST TECHNIQUE: Contiguous axial images were obtained from the base of the skull through the vertex without intravenous contrast. COMPARISON:  12/03/2014 FINDINGS: Brain: Stable age related cerebral atrophy, ventriculomegaly and periventricular white matter disease. No extra-axial fluid collections are identified. No CT findings for acute hemispheric infarction or intracranial hemorrhage. Remote encephalomalacia in the left frontal area. No mass lesions. The brainstem and cerebellum are normal. Vascular: Minimal stable vascular calcifications. No hyperdense vessels. Skull: No skull fracture or bone lesion. Mild hyperostosis frontalis interna. Sinuses/Orbits: The paranasal sinuses and mastoid air cells are clear. The globes are intact. Other: No scalp lesions or hematoma. IMPRESSION: Stable age related cerebral atrophy, ventriculomegaly and periventricular white matter disease. Remote area of encephalomalacia in the left frontal area. No acute  intracranial findings or skull fracture. Electronically Signed   By: Marijo Sanes M.D.   On: 03/12/2016 16:17    Procedures Procedures (including critical care  time)  Medications Ordered in ED Medications - No data to display   Initial Impression / Assessment and Plan / ED Course  I have reviewed the triage vital signs and the nursing notes.  Pertinent labs & imaging results that were available during my care of the patient were reviewed by me and considered in my medical decision making (see chart for details).  Clinical Course    DDx includes: Orthostatic hypotension Stroke Vertebral artery dissection/stenosis Dysrhythmia PE Vasovagal/neurocardiogenic syncope Aortic stenosis Valvular disorder/Cardiomyopathy Anemia Seizure TIA  Pt comes in with cc of ankle pain, likely fall, amnesia of the events that happened last night. Pt is ao x 3. She has ankle swelling -which makes [place] think that 1. She had a fall, and 2. The etiology could be seizure/syncope. Doubt TIA - as there would be some recollection of things being normal, and it would be unlikely to cause a fall. CT head ordered - and is neg. Syncope vs. Seizure considered more likely. Syncope - medtronic interrogation yielded no dysrhythmias. If there was no seizure - it is likely first episode, and there is no tx with them.  We will monitor here closely. Get appropriate imaging. Get case management involved - as pt unable to walk.  @7 :40 Case helped with need. Orders placed for home care. Daughter lives across the street and willing to help. Strict ER return precautions have been discussed, and patient is agreeing with the plan and is comfortable with the workup done and the recommendations from the ER.     Final Clinical Impressions(s) / ED Diagnoses   Final diagnoses:  Ankle sprain, right, initial encounter  Syncope and collapse    New Prescriptions New Prescriptions   No medications on file     Varney Biles, MD 03/12/16 1943

## 2016-03-12 NOTE — ED Triage Notes (Signed)
Pt here from home with unwitnessed fall vs syncope; pt does not remember event; per family thinks pt may have had TIA; pt with right ankle pain and swelling noted

## 2016-03-12 NOTE — Progress Notes (Signed)
Orthopedic Tech Progress Note Patient Details:  Ann Kaufman 07-25-1929 [id number]  Ortho Devices Type of Ortho Device: ASO Ortho Device/Splint Location: RLE  Ortho Device/Splint Interventions: Ordered, Application   Braulio Bosch 03/12/2016, 8:03 PM

## 2016-03-12 NOTE — ED Notes (Addendum)
Myself and Janett Billow, nursing student attempted to ambulate patient without success but patient could not bear any weight on her right ankle; also patient is aware of need of urine specimen; patient attempted to provide an urine sample by using a bedpan but was unsuccessful and stated she did not wanted to be in and out cathed; patient stated she will attempt the bedpan again later; daughter at bedside

## 2016-03-12 NOTE — Progress Notes (Signed)
Patient suffers from right ankle sprain which impairs their ability to perform daily activities like ambulating, bathing, dressing in the home. A walker will not resolve  issue with performing activities of daily living. A wheelchair will allow patient to safely perform daily activities. Patient is not able to propel themselves in the home using a standard weight wheelchair due to weakness. Patient can self propel in the lightweight wheelchair.  Accessories: elevating leg rests (ELRs), wheel locks, extensions and anti-tippers.

## 2016-03-12 NOTE — ED Notes (Signed)
Pt provided with d/c instructions at this time. Pt verbalizes understanding of d/c instructions as well as follow up procedure after d/c.  No new RX at time of d/c.  Pt transported home via [facility] at family request.   Pt in no apparent distress at this time.

## 2016-03-12 NOTE — ED Notes (Signed)
Paged ortho tech at this time for pt's right ankle ASO and ace wrap per MD order.

## 2016-03-12 NOTE — Discharge Instructions (Signed)
WE HAVE ARRANGED FOR THE HOME HEALTH. WE HAVE CHECKED WITH [employer] - AND NO IRREGULARITIES WERE FOUND. IF THERE IS ANY RECURRENT FAINTING -RETURN TO THE ER.  We dont think you had a TIA - CT head was normal here, but see your primary doctor in a week to see if any further TIA workup is needed.

## 2016-03-12 NOTE — Care Management Note (Addendum)
Case Management Note  Patient Details  Name: Ann Kaufman MRN: [id number] Date of Birth: 06/07/1930  Subjective/Objective:  Patient presents to Ed with fall with injury to right ankle.                  Action/Plan: Discussed home health services with patient's daughter Santiago Glad.  She reports she has notified Mickel Crow, a private duty nursing agency to come and see patient on Friday morning for consultation of services.  She reports she thinks the patient has had [facility] in the past and is agreeable to have [facility] for services again for patient.  She is interested in renting a wheelchair from [facility] and patient will require a 3 in 1.  She lives across the street from the patient.  She will try to have other members assist patient as well as she works full time, hence the private duty nursing agency.  She reports the patient has a Corporate investment banker and a shower bench.  She reports the patient is going to have a difficult time getting into her bathroom.  EDCM will ask if patient can be discharged with a bed pan.  Patient's daughter is agreeable with discharge plan. EDCM provided patient's daughter with phone number for [facility] for equipment and services.  Patient's daughter offered choice of dme agency but preferred to stay with [facility].  Discussed patient with EDP and EDRN.  No further EDCM needs at this time.   Expected Discharge Date:                  Expected Discharge Plan:  [place]  In-House Referral:     Discharge planning Services  CM Consult  Post Acute Care Choice:  Durable Medical Equipment, Home Health Choice offered to:  Adult Children  DME Arranged:  3-N-1, Programmer, multimedia DME Agency:  [place]:  RN, PT, OT, Nurse's Aide, Social Work [employer] Agency:  [place]  Status of Service:  Completed, signed off  If discussed at [employer], dates discussed:    Additional Comments:EDCM faxed home health orders to [facility] with confirmation of  receipt. Christen Bame, [place] Difranco, RN 03/12/2016, 6:50 PM

## 2016-03-12 NOTE — ED Notes (Signed)
Spoke with Amy in case management at this time regarding pt's home health follow up.  Per Amy pt needs to be d/c'd with a bed pan and transport home but all home health follow up has been arranged.

## 2016-03-12 NOTE — ED Notes (Signed)
Spoke with ortho tech about pt's need for ASO at this time.

## 2016-03-14 DIAGNOSIS — I509 Heart failure, unspecified: Secondary | ICD-10-CM | POA: Diagnosis not present

## 2016-03-14 DIAGNOSIS — Z8673 Personal history of transient ischemic attack (TIA), and cerebral infarction without residual deficits: Secondary | ICD-10-CM | POA: Diagnosis not present

## 2016-03-14 DIAGNOSIS — S93401D Sprain of unspecified ligament of right ankle, subsequent encounter: Secondary | ICD-10-CM | POA: Diagnosis not present

## 2016-03-14 DIAGNOSIS — Z9181 History of falling: Secondary | ICD-10-CM | POA: Diagnosis not present

## 2016-03-14 DIAGNOSIS — E785 Hyperlipidemia, unspecified: Secondary | ICD-10-CM | POA: Diagnosis not present

## 2016-03-14 DIAGNOSIS — R296 Repeated falls: Secondary | ICD-10-CM | POA: Diagnosis not present

## 2016-03-14 DIAGNOSIS — Z950 Presence of cardiac pacemaker: Secondary | ICD-10-CM | POA: Diagnosis not present

## 2016-03-14 DIAGNOSIS — I4891 Unspecified atrial fibrillation: Secondary | ICD-10-CM | POA: Diagnosis not present

## 2016-03-14 DIAGNOSIS — I110 Hypertensive heart disease with heart failure: Secondary | ICD-10-CM | POA: Diagnosis not present

## 2016-03-17 DIAGNOSIS — I509 Heart failure, unspecified: Secondary | ICD-10-CM | POA: Diagnosis not present

## 2016-03-17 DIAGNOSIS — I4891 Unspecified atrial fibrillation: Secondary | ICD-10-CM | POA: Diagnosis not present

## 2016-03-17 DIAGNOSIS — R296 Repeated falls: Secondary | ICD-10-CM | POA: Diagnosis not present

## 2016-03-17 DIAGNOSIS — S93401D Sprain of unspecified ligament of right ankle, subsequent encounter: Secondary | ICD-10-CM | POA: Diagnosis not present

## 2016-03-17 DIAGNOSIS — I110 Hypertensive heart disease with heart failure: Secondary | ICD-10-CM | POA: Diagnosis not present

## 2016-03-17 DIAGNOSIS — S93491A Sprain of other ligament of right ankle, initial encounter: Secondary | ICD-10-CM | POA: Diagnosis not present

## 2016-03-17 DIAGNOSIS — E785 Hyperlipidemia, unspecified: Secondary | ICD-10-CM | POA: Diagnosis not present

## 2016-03-18 ENCOUNTER — Telehealth: Payer: Self-pay | Admitting: General Practice

## 2016-03-18 DIAGNOSIS — E785 Hyperlipidemia, unspecified: Secondary | ICD-10-CM | POA: Diagnosis not present

## 2016-03-18 DIAGNOSIS — S93401D Sprain of unspecified ligament of right ankle, subsequent encounter: Secondary | ICD-10-CM | POA: Diagnosis not present

## 2016-03-18 DIAGNOSIS — I110 Hypertensive heart disease with heart failure: Secondary | ICD-10-CM | POA: Diagnosis not present

## 2016-03-18 DIAGNOSIS — R296 Repeated falls: Secondary | ICD-10-CM | POA: Diagnosis not present

## 2016-03-18 DIAGNOSIS — I509 Heart failure, unspecified: Secondary | ICD-10-CM | POA: Diagnosis not present

## 2016-03-18 DIAGNOSIS — I4891 Unspecified atrial fibrillation: Secondary | ICD-10-CM | POA: Diagnosis not present

## 2016-03-19 DIAGNOSIS — S93401D Sprain of unspecified ligament of right ankle, subsequent encounter: Secondary | ICD-10-CM | POA: Diagnosis not present

## 2016-03-19 DIAGNOSIS — I509 Heart failure, unspecified: Secondary | ICD-10-CM | POA: Diagnosis not present

## 2016-03-19 DIAGNOSIS — R748 Abnormal levels of other serum enzymes: Secondary | ICD-10-CM | POA: Diagnosis not present

## 2016-03-19 DIAGNOSIS — R296 Repeated falls: Secondary | ICD-10-CM | POA: Diagnosis not present

## 2016-03-19 DIAGNOSIS — I951 Orthostatic hypotension: Secondary | ICD-10-CM | POA: Diagnosis not present

## 2016-03-19 DIAGNOSIS — R10817 Generalized abdominal tenderness: Secondary | ICD-10-CM | POA: Diagnosis not present

## 2016-03-19 DIAGNOSIS — I4891 Unspecified atrial fibrillation: Secondary | ICD-10-CM | POA: Diagnosis not present

## 2016-03-19 DIAGNOSIS — E785 Hyperlipidemia, unspecified: Secondary | ICD-10-CM | POA: Diagnosis not present

## 2016-03-19 DIAGNOSIS — R42 Dizziness and giddiness: Secondary | ICD-10-CM | POA: Diagnosis not present

## 2016-03-19 DIAGNOSIS — I110 Hypertensive heart disease with heart failure: Secondary | ICD-10-CM | POA: Diagnosis not present

## 2016-03-20 ENCOUNTER — Encounter: Payer: Self-pay | Admitting: Hematology

## 2016-03-20 ENCOUNTER — Telehealth: Payer: Self-pay | Admitting: Hematology

## 2016-03-20 ENCOUNTER — Ambulatory Visit
Admission: RE | Admit: 2016-03-20 | Discharge: 2016-03-20 | Disposition: A | Discharge status: Home / Self Care | Payer: Medicare Other | Source: Ambulatory Visit | Attending: Internal Medicine | Admitting: Internal Medicine

## 2016-03-20 DIAGNOSIS — S93401D Sprain of unspecified ligament of right ankle, subsequent encounter: Secondary | ICD-10-CM | POA: Diagnosis not present

## 2016-03-20 DIAGNOSIS — K449 Diaphragmatic hernia without obstruction or gangrene: Secondary | ICD-10-CM | POA: Diagnosis not present

## 2016-03-20 DIAGNOSIS — R296 Repeated falls: Secondary | ICD-10-CM | POA: Diagnosis not present

## 2016-03-20 DIAGNOSIS — I4891 Unspecified atrial fibrillation: Secondary | ICD-10-CM | POA: Diagnosis not present

## 2016-03-20 DIAGNOSIS — E785 Hyperlipidemia, unspecified: Secondary | ICD-10-CM | POA: Diagnosis not present

## 2016-03-20 DIAGNOSIS — R748 Abnormal levels of other serum enzymes: Secondary | ICD-10-CM

## 2016-03-20 DIAGNOSIS — I509 Heart failure, unspecified: Secondary | ICD-10-CM | POA: Diagnosis not present

## 2016-03-20 DIAGNOSIS — I110 Hypertensive heart disease with heart failure: Secondary | ICD-10-CM | POA: Diagnosis not present

## 2016-03-20 MED ORDER — IOPAMIDOL (ISOVUE-300) INJECTION 61%
80.0000 mL | Freq: Once | INTRAVENOUS | Status: AC | PRN
  Administered 2016-03-20: 80 mL via INTRAVENOUS

## 2016-03-20 NOTE — Telephone Encounter (Signed)
Appt scheduled with Dr. Burr Medico on 10/3 at [place] to the patient's daughter. Requesting that the word cancer not be mentioned in front of the patient because of her mental status. Location given. Letter mailed.

## 2016-03-21 DIAGNOSIS — I509 Heart failure, unspecified: Secondary | ICD-10-CM | POA: Diagnosis not present

## 2016-03-21 DIAGNOSIS — R296 Repeated falls: Secondary | ICD-10-CM | POA: Diagnosis not present

## 2016-03-21 DIAGNOSIS — E785 Hyperlipidemia, unspecified: Secondary | ICD-10-CM | POA: Diagnosis not present

## 2016-03-21 DIAGNOSIS — I4891 Unspecified atrial fibrillation: Secondary | ICD-10-CM | POA: Diagnosis not present

## 2016-03-21 DIAGNOSIS — S93401D Sprain of unspecified ligament of right ankle, subsequent encounter: Secondary | ICD-10-CM | POA: Diagnosis not present

## 2016-03-21 DIAGNOSIS — I110 Hypertensive heart disease with heart failure: Secondary | ICD-10-CM | POA: Diagnosis not present

## 2016-03-24 DIAGNOSIS — R296 Repeated falls: Secondary | ICD-10-CM | POA: Diagnosis not present

## 2016-03-24 DIAGNOSIS — I509 Heart failure, unspecified: Secondary | ICD-10-CM | POA: Diagnosis not present

## 2016-03-24 DIAGNOSIS — I110 Hypertensive heart disease with heart failure: Secondary | ICD-10-CM | POA: Diagnosis not present

## 2016-03-24 DIAGNOSIS — I4891 Unspecified atrial fibrillation: Secondary | ICD-10-CM | POA: Diagnosis not present

## 2016-03-24 DIAGNOSIS — S93401D Sprain of unspecified ligament of right ankle, subsequent encounter: Secondary | ICD-10-CM | POA: Diagnosis not present

## 2016-03-24 DIAGNOSIS — E785 Hyperlipidemia, unspecified: Secondary | ICD-10-CM | POA: Diagnosis not present

## 2016-03-25 ENCOUNTER — Ambulatory Visit (HOSPITAL_BASED_OUTPATIENT_CLINIC_OR_DEPARTMENT_OTHER): Payer: Medicare Other | Admitting: Hematology

## 2016-03-25 ENCOUNTER — Other Ambulatory Visit (HOSPITAL_BASED_OUTPATIENT_CLINIC_OR_DEPARTMENT_OTHER): Payer: Medicare Other

## 2016-03-25 ENCOUNTER — Telehealth: Payer: Self-pay | Admitting: Hematology

## 2016-03-25 ENCOUNTER — Encounter: Payer: Self-pay | Admitting: Hematology

## 2016-03-25 DIAGNOSIS — I4891 Unspecified atrial fibrillation: Secondary | ICD-10-CM | POA: Diagnosis not present

## 2016-03-25 DIAGNOSIS — E785 Hyperlipidemia, unspecified: Secondary | ICD-10-CM | POA: Diagnosis not present

## 2016-03-25 DIAGNOSIS — C259 Malignant neoplasm of pancreas, unspecified: Secondary | ICD-10-CM | POA: Insufficient documentation

## 2016-03-25 DIAGNOSIS — K869 Disease of pancreas, unspecified: Secondary | ICD-10-CM | POA: Diagnosis not present

## 2016-03-25 DIAGNOSIS — I110 Hypertensive heart disease with heart failure: Secondary | ICD-10-CM | POA: Diagnosis not present

## 2016-03-25 DIAGNOSIS — C251 Malignant neoplasm of body of pancreas: Secondary | ICD-10-CM

## 2016-03-25 DIAGNOSIS — S93401D Sprain of unspecified ligament of right ankle, subsequent encounter: Secondary | ICD-10-CM | POA: Diagnosis not present

## 2016-03-25 DIAGNOSIS — R296 Repeated falls: Secondary | ICD-10-CM | POA: Diagnosis not present

## 2016-03-25 DIAGNOSIS — I509 Heart failure, unspecified: Secondary | ICD-10-CM | POA: Diagnosis not present

## 2016-03-25 LAB — CEA (IN HOUSE-CHCC): CEA (CHCC-IN HOUSE): 8.19 ng/mL — AB (ref 0.00–5.00)

## 2016-03-25 MED ORDER — TRAMADOL HCL 50 MG PO TABS: 50.0000 mg | ORAL_TABLET | Freq: Three times a day (TID) | ORAL | 0 refills | Status: DC | PRN

## 2016-03-25 NOTE — Progress Notes (Signed)
[facility]  [phone]) [phone] Fax:(336) [phone]  Clinic New Consult Note   Patient Care Team: Velna Hatchet, MD as PCP - General (Internal Medicine) Isaias Cowman, MD as Consulting Physician (Internal Medicine) 03/25/2016  Referring physician: Velna Hatchet, MD  CHIEF COMPLAINTS/PURPOSE OF CONSULTATION:  Pancreatic mass  HISTORY OF PRESENTING ILLNESS:  Ann Kaufman 82 y.o. female with past medical history of atrial fibrillation, hypertension, CHF diastolic, hemorrhagic CVA in 2014, and mild dementia is here because of her recent abnormal CT findings. She is accompanied by her daughter to my clinic today. She was referred by her primary care physician.  She had sprained ankle about 2-3 weeks ago, which was managed conservatively. During her physical therapy about 2 weeks ago, she started having dizziness when she stands up, and was found to be orthostatic hypotensive. She also reports intermittent abdominal pain, nausea, worsening fatigue, decreased appetite, and 10-15 lbs weight loss in the past few months. She was seen by her primary care physician Dr. Ardeth Perfect, and underwent CT abdomen and pelvis on 03/20/2016. The scan showed a 3.2 cm mass in the pancreatitis, with direct invasion into the spleen, measuring 4.8 cm. She was referred to [place] for further evaluation.  MEDICAL HISTORY:  Past Medical History:  Diagnosis Date  . Aphasia    Occasional aphasia & facial numbness from a left frontal lobe intercerebral hemorrhage on Xarelto on 05/18/13.  . Arthritis   . Atrial fibrillation ([place]) 2012   a. Dx in 2012;  b. Rhythm controlled - amiodarone, previously anticoagulated with xarelto (d/c'd 04/2013 in setting of [place]);  b. 04/2013 Echo: EF 55-60%, Gr 1 DD, mild MR, mod dil LA, PAsP 101mhg  . Depression   . Headache   . Hyperlipidemia   . Hypertension   . Intracerebral hemorrhage ([place]    a. 04/2013 in setting of xarelto therapy.  . Mitral valve prolapse    . Pacemaker   . PAF ([place]oxysmal atrial fibrillation) ([place]   . Palpitations    Occasional.  . Pericardial effusion   . Peripheral edema    Because she has been standing on her feet more than usual   . Presence of permanent cardiac pacemaker    a. [place]  . Rheumatoid arthritis ([facility]   . SOB (shortness of breath)    Occasional SOB  . SSS (sick sinus syndrome) ([facility])    s/p dual chamber pacemaker    SURGICAL HISTORY: Past Surgical History:  Procedure Laterality Date  . ABDOMINAL HYSTERECTOMY    . APPENDECTOMY    . BOWEL RESECTION    . BREAST LUMPECTOMY    . LEAD REVISION  09/17/11   s/p atrial lead revision  . PACEMAKER INSERTION     [place] [facility] [place]   Social History  . Marital status: Widowed    Spouse name: pEddie Dibbles . Number of children: 3  . Years of education: 12   Occupational History  . retired    Social History Main Topics  . Smoking status: Former Smoker    Packs/day: 0.25    Years: 30.00    Quit date: 11/26/1968  . Smokeless tobacco: Never Used  . Alcohol use Yes     Comment: wine on occasionally  . Drug use:     Types: Other-see comments  . Sexual activity: Not on file   Other Topics Concern  . Not on file   Social History Narrative  Lives in [place] by herself.  Daughter lives in [place].    FAMILY HISTORY: Family History  Problem Relation Age of Onset  . Stroke Father   . Aneurysm Mother   . Other      negative for premature CAD.  Marland Kitchen Cancer Sister 57    esophageal cancer   . Cancer Other     nephew had lung and kidney cancer     ALLERGIES:  has No Known Allergies.  MEDICATIONS:  Current Outpatient Prescriptions  Medication Sig Dispense Refill  . amiodarone (PACERONE) 200 MG tablet Take 0.5 tablets (100 mg total) by mouth every morning.    Marland Kitchen aspirin 325 MG EC tablet Take 325 mg by mouth daily.     . diclofenac sodium (VOLTAREN) 1 % GEL Apply 1  application topically 3 (three) times daily as needed (pain).    Marland Kitchen ergocalciferol (VITAMIN D2) 50000 units capsule TAKE 1 CAPSULE BY MOUTH EVERY 14 DAYS    . famotidine (PEPCID) 20 MG tablet Take 1 tablet (20 mg total) by mouth 2 (two) times daily.    . folic acid (FOLVITE) 355 MCG tablet Take 1 tablet (400 mcg total) by mouth every evening. 30 tablet 4  . levETIRAcetam (KEPPRA) 500 MG tablet TAKE 1 TABLET TWICE A DAY 180 tablet 0  . metoprolol tartrate (LOPRESSOR) 25 MG tablet Take 1 tablet (25 mg total) by mouth 2 (two) times daily. 60 tablet 1  . senna (SENOKOT) 8.6 MG TABS tablet Take 2 tablets (17.2 mg total) by mouth at bedtime. 120 each 0  . sertraline (ZOLOFT) 50 MG tablet TAKE 50 MG BY MOUTH AT BEDTIME 90 tablet 3  . simvastatin (ZOCOR) 10 MG tablet TAKE 10 MG BY MOUTH AT BEDTIME 90 tablet 3  . vitamin B-12 (CYANOCOBALAMIN) 1000 MCG tablet Take 1 tablet (1,000 mcg total) by mouth every evening. 30 tablet 1  . traMADol (ULTRAM) 50 MG tablet Take 1 tablet (50 mg total) by mouth every 8 (eight) hours as needed. 30 tablet 0   No current facility-administered medications for this visit.     REVIEW OF SYSTEMS:   Constitutional: Denies fevers, chills or abnormal night sweats Eyes: Denies blurriness of vision, double vision or watery eyes Ears, nose, mouth, throat, and face: Denies mucositis or sore throat Respiratory: Denies cough, dyspnea or wheezes Cardiovascular: Denies palpitation, chest discomfort or lower extremity swelling Gastrointestinal:  Denies nausea, heartburn or change in bowel habits Skin: Denies abnormal skin rashes Lymphatics: Denies new lymphadenopathy or easy bruising Neurological:Denies numbness, tingling or new weaknesses Behavioral/Psych: Mood is stable, no new changes  All other systems were reviewed with the patient and are negative.  PHYSICAL EXAMINATION: ECOG PERFORMANCE STATUS: 2 - Symptomatic, <50% confined to bed  Vitals:   03/25/16 1109  BP: (!) 101/51   Pulse: 68  Resp: 17  Temp: 98 F (36.7 C)   Filed Weights   03/25/16 1109  Weight: 153 lb 11.2 oz (69.7 kg)    GENERAL:alert, no distress and comfortable SKIN: skin color, texture, turgor are normal, no rashes or significant lesions EYES: normal, conjunctiva are pink and non-injected, sclera clear OROPHARYNX:no exudate, no erythema and lips, buccal mucosa, and tongue normal  NECK: supple, thyroid normal size, non-tender, without nodularity LYMPH:  no palpable lymphadenopathy in the cervical, axillary or inguinal LUNGS: clear to auscultation and percussion with normal breathing effort HEART: regular rate & rhythm and no murmurs and no lower extremity edema ABDOMEN:abdomen soft, Mild tenderness in the epigastric area, no  organomegaly, normal bowel sounds Musculoskeletal:no cyanosis of digits and no clubbing  PSYCH: alert & oriented x 3 with fluent speech NEURO: no focal motor/sensory deficits  LABORATORY DATA:  I have reviewed the data as listed CBC Latest Ref Rng & Units 03/12/2016 03/12/2016 03/14/2015  WBC 4.0 - 10.5 K/uL - 11.5(H) 5.2  Hemoglobin 12.0 - 15.0 g/dL 15.0 13.4 12.0  Hematocrit 36.0 - 46.0 % 44.0 42.0 36.6  Platelets 150 - 400 K/uL - 212 123(L)   CMP Latest Ref Rng & Units 03/12/2016 03/12/2016 03/14/2015  Glucose 65 - 99 mg/dL 72 75 91  BUN 6 - 20 mg/dL 26(H) 18 23(H)  Creatinine 0.44 - 1.00 mg/dL 1.20(H) 1.30(H) 1.15(H)  Sodium 135 - 145 mmol/L 137 136 138  Potassium 3.5 - 5.1 mmol/L 4.2 4.2 4.2  Chloride 101 - 111 mmol/L 98(L) 100(L) 104  CO2 22 - 32 mmol/L - 27 24  Calcium 8.9 - 10.3 mg/dL - 9.2 9.0  Total Protein 6.5 - 8.1 g/dL - 7.0 -  Total Bilirubin 0.3 - 1.2 mg/dL - 0.9 -  Alkaline Phos 38 - 126 U/L - 338(H) -  AST 15 - 41 U/L - 42(H) -  ALT 14 - 54 U/L - 28 -     RADIOGRAPHIC STUDIES: I have personally reviewed the radiological images as listed and agreed with the findings in the report. Dg Ankle Complete Right  Result Date: 03/12/2016 CLINICAL  DATA:  Fall with right ankle pain and swelling. Initial encounter. EXAM: RIGHT ANKLE - COMPLETE 3+ VIEW COMPARISON:  05/28/2013 FINDINGS: Soft tissue swelling greatest about the lateral malleolus. On AP view there is subtle lateral malleolus cortical lucency but on the oblique view no fracture is noted. No dislocation. Heel, medial malleolus, and talonavicular spurs.  Osteopenia. IMPRESSION: Soft tissue swelling without acute osseous finding. Electronically Signed   By: Monte Fantasia M.D.   On: 03/12/2016 16:06   Ct Head Wo Contrast  Result Date: 03/12/2016 CLINICAL DATA:  [place].  Hit head. EXAM: CT HEAD WITHOUT CONTRAST TECHNIQUE: Contiguous axial images were obtained from the base of the skull through the vertex without intravenous contrast. COMPARISON:  12/03/2014 FINDINGS: Brain: Stable age related cerebral atrophy, ventriculomegaly and periventricular white matter disease. No extra-axial fluid collections are identified. No CT findings for acute hemispheric infarction or intracranial hemorrhage. Remote encephalomalacia in the left frontal area. No mass lesions. The brainstem and cerebellum are normal. Vascular: Minimal stable vascular calcifications. No hyperdense vessels. Skull: No skull fracture or bone lesion. Mild hyperostosis frontalis interna. Sinuses/Orbits: The paranasal sinuses and mastoid air cells are clear. The globes are intact. Other: No scalp lesions or hematoma. IMPRESSION: Stable age related cerebral atrophy, ventriculomegaly and periventricular white matter disease. Remote area of encephalomalacia in the left frontal area. No acute intracranial findings or skull fracture. Electronically Signed   By: Marijo Sanes M.D.   On: 03/12/2016 16:17   Ct Abdomen Pelvis W Contrast  Addendum Date: 03/20/2016   ADDENDUM REPORT: 03/20/2016 13:24 ADDENDUM: The findings were called to Dr. Virgina Jock on 03/20/2016 1:15 p.m. Electronically Signed   By: Nolon Nations M.D.   On: 03/20/2016 13:24    Result Date: 03/20/2016 CLINICAL DATA:  Alkaline phosphate elevation, pelvic pain, lower abdominal pain with bloating for 2 weeks. No hx of cancer, teratoma tumor of the abdomen with hysterectomy. Previous: epic.^94m ISOVUE-300 IOPAMIDOL (ISOVUE-300) INJECTION 61% EXAM: CT ABDOMEN AND PELVIS WITH CONTRAST TECHNIQUE: Multidetector CT imaging of the abdomen and pelvis was performed using the standard  protocol following bolus administration of intravenous contrast. CONTRAST:  43m ISOVUE-300 IOPAMIDOL (ISOVUE-300) INJECTION 61% COMPARISON:  08/07/2014, 07/07/2006 FINDINGS: Lower chest: No pulmonary nodules, pleural effusions, or infiltrates. Patient has pacer leads to the right atrium and right ventricle. Mitral annulus repair. Hepatobiliary: Significant intra and extrahepatic biliary duct dilatation. Common bile duct is 11 mm. No definite liver lesions are identified. Within the porta hepatis, there is ill-defined density suspicious for tumor. The gallbladder is present and appears distended. No calcified gallstones are present. The distal common bile duct tapers abruptly in the region of the pancreatic head. Pancreas: Within the region the pancreatic tail, there is low-attenuation possible mass measuring 1.8 x 3.2 cm. This portion of the pancreas is now contiguous with the splenic hilum, a change compared with prior studies. Adjacent to the solid mass, there is a focal area of pancreatic duct dilatation. Spleen: Spleen is enlarged. Within the splenic hilum there is a new low-attenuation lesion measuring 4.1 x 4.8 x 4.5 cm. The lesion is contiguous with the pancreatic tail. Adrenals/Urinary Tract: Adrenal glands are normal. Numerous bilateral renal cysts are identified. No hydronephrosis. Left extrarenal pelvis again noted. Stomach/Bowel: Small hiatal hernia is present. The stomach is normal in appearance. There is partial malrotation of the small bowel with proximal jejunal loops descending in the right mid  abdomen. Otherwise the small bowel are normal in caliber and wall thickness. There is a large amount of stool in the colon. Colonic wall is otherwise normal. Vascular/Lymphatic: There is adenopathy in the region of the porta hepatis and portacaval region. Porta hepatis node is 1.2 cm. The celiac node is 1.4 cm. Portacaval node is 1.5 cm. Reproductive: The uterus is absent. Urinary bladder has a normal appearance. No adnexal mass. Trace free pelvic fluid. Moderate atherosclerotic calcification of the abdominal aorta. Other: Small ill-defined densities are identified within the mesentery. Largest is 1.5 cm on image 48 of series 2. A second is 1.1 cm on image 62 of series 2. These may represent small lymph nodes or mesenteric tumor. Musculoskeletal: Moderate degenerative changes are identified in the lower thoracic and lumbar spine. No suspicious lytic or blastic lesions are identified. IMPRESSION: 1. Significant intra and extrahepatic biliary duct dilatation. Abrupt tapering of the distal common bile duct suspicious for malignant stricture. 2. Low-attenuation lesion in the pancreatic tail suspicious for malignancy. Adjacent splenic lesion suspicious for metastasis. 3. Soft tissue and adenopathy in the porta hepatis. 4. Retroperitoneal and peripancreatic adenopathy. Possible mesenteric lymphadenopathy or tumor. 5. Further evaluation with MRI is recommended. MRI should be performed when the patient is clinically stable and able to follow breath holding instructions (usually best performed on an outpatient basis). 6. Small hiatal hernia. 7.  Aortic atherosclerosis. 8. Status post hysterectomy. Multiple attempts have been made contact the patient's provider. We will continue to try contracting the patient's care team. In the meantime, a radiologist can be reached at [phone]. Electronically Signed: By: ENolon NationsM.D. On: 03/20/2016 13:07    ASSESSMENT & PLAN:  82year old Caucasian female, with multiple  comorbidities, presents with worsening abdominal pain, nausea, fatigue, anorexia, and weight loss, and orthostatic hypotension.  1. Pancreatic tail mass, with direct invasion to spleen, portal hepatis adenopathy  -I reviewed her CT scan images with patient and her daughter in great detail. The pancreatic to mass is highly suspicious for malignancy, especially adenocarcinoma. It appears has directly invading the spleen, possible surrounding adenopathy. -I recommend a PET scan to further evaluate the extent of her disease  -Dependent  on the PET scan findings, I'll recommend a tissue biopsy for diagnosis. I'll present her case in our tumor board, to see if we can do EGD and EUS vs percutaneous biopsy -I'll also obtain tumor markers, CA 19.9, CEA -I discussed with her daughter that this is likely metastatic disease, and is not resectable. Given her advanced age and comorbidities, and aggressive nature of pancreatic cancer, she is unlikely a candidate for surgical resection also. -If biopsy confirms adenocarcinoma, I'll check MSI, to see if she is a candidate for immunotherapy. I do not think she is a candidate for chemotherapy, due to her advanced age and comorbidities. -We also discussed alternative options. Given her advanced age, comorbidities, and very limited option if this is pancreatic adenocarcinoma,  It will be also reasonable not to pursue any further workup or treatment. After a lengthy discussion with patient's daughter, both patient and her daughter wishes to have tissue biopsy.   2. Atrial fibrillation, hypertension, diastolic CHF, please and orthostatic hypotension -She will continue follow-up with her primary care physician -She is not on anticoagulation due to her prior history of hemorrhagic stroke when she was on Xarelto  -Her medication has been adjusted due to her orthostatic hypertension. I encouraged her to drink more fluids.  3. History of TIA and hemorrhagic stroke  4. Mild  dementia   Plan -will obtain PET/CT for further evaluation -I'll present her case in our GI tumor board -I'll decide tissue biopsy after her PET scan -I'll plan to see her back after her biopsy.   All questions were answered. The patient knows to call the clinic with any problems, questions or concerns. I spent 55 minutes counseling the patient face to face. The total time spent in the appointment was 60 minutes and more than 50% was on counseling.     Truitt Merle, MD 03/25/2016    Addendum   her case was discussed in our GI tumor Board. We will wait for the PET scan result. If no other metastatic lesion, Dr. Ardis Hughs can try EGD, EUS and endoscopic biopsy of the pancreatic mass. If she does have metastatic liver or other lesion, IR can try percutaneous biopsy. I called her daughter about the above discussion. I also answered her questions about overall prognosis if she has pancreatic adenocarcinoma.  Truitt Merle  03/27/2016

## 2016-03-25 NOTE — Telephone Encounter (Signed)
GAVE PATIENT AVS REPORT AND APPOINTMENTS FOR October  °

## 2016-03-26 ENCOUNTER — Encounter: Payer: Self-pay | Admitting: Genetic Counselor

## 2016-03-26 DIAGNOSIS — S93401D Sprain of unspecified ligament of right ankle, subsequent encounter: Secondary | ICD-10-CM | POA: Diagnosis not present

## 2016-03-26 DIAGNOSIS — I4891 Unspecified atrial fibrillation: Secondary | ICD-10-CM | POA: Diagnosis not present

## 2016-03-26 DIAGNOSIS — E785 Hyperlipidemia, unspecified: Secondary | ICD-10-CM | POA: Diagnosis not present

## 2016-03-26 DIAGNOSIS — I509 Heart failure, unspecified: Secondary | ICD-10-CM | POA: Diagnosis not present

## 2016-03-26 DIAGNOSIS — I110 Hypertensive heart disease with heart failure: Secondary | ICD-10-CM | POA: Diagnosis not present

## 2016-03-26 DIAGNOSIS — R296 Repeated falls: Secondary | ICD-10-CM | POA: Diagnosis not present

## 2016-03-26 LAB — CANCER ANTIGEN 19-9: CA 19-9: 25773 U/mL — ABNORMAL HIGH (ref 0–35)

## 2016-03-27 ENCOUNTER — Encounter: Payer: Self-pay | Admitting: Hematology

## 2016-03-27 ENCOUNTER — Telehealth: Payer: Self-pay | Admitting: Hematology

## 2016-03-27 DIAGNOSIS — S93401D Sprain of unspecified ligament of right ankle, subsequent encounter: Secondary | ICD-10-CM | POA: Diagnosis not present

## 2016-03-27 DIAGNOSIS — I110 Hypertensive heart disease with heart failure: Secondary | ICD-10-CM | POA: Diagnosis not present

## 2016-03-27 DIAGNOSIS — I509 Heart failure, unspecified: Secondary | ICD-10-CM | POA: Diagnosis not present

## 2016-03-27 DIAGNOSIS — E785 Hyperlipidemia, unspecified: Secondary | ICD-10-CM | POA: Diagnosis not present

## 2016-03-27 DIAGNOSIS — R296 Repeated falls: Secondary | ICD-10-CM | POA: Diagnosis not present

## 2016-03-27 DIAGNOSIS — I4891 Unspecified atrial fibrillation: Secondary | ICD-10-CM | POA: Diagnosis not present

## 2016-03-27 NOTE — Telephone Encounter (Signed)
I called her daughter back, and answered her questions. Her images were reviewed in our GI tumor conference yesterday. Her tumor marker CA 19.9 was significantly elevated, this is very concerning for pancreatic adenocarcinoma.Ann Kaufman to proceed with PET, I'll call her back after PET scan done, and make a decision about her biopsy.  Truitt Merle 03/27/2016

## 2016-03-28 DIAGNOSIS — I509 Heart failure, unspecified: Secondary | ICD-10-CM | POA: Diagnosis not present

## 2016-03-28 DIAGNOSIS — I110 Hypertensive heart disease with heart failure: Secondary | ICD-10-CM | POA: Diagnosis not present

## 2016-03-28 DIAGNOSIS — R296 Repeated falls: Secondary | ICD-10-CM | POA: Diagnosis not present

## 2016-03-28 DIAGNOSIS — I4891 Unspecified atrial fibrillation: Secondary | ICD-10-CM | POA: Diagnosis not present

## 2016-03-28 DIAGNOSIS — S93401D Sprain of unspecified ligament of right ankle, subsequent encounter: Secondary | ICD-10-CM | POA: Diagnosis not present

## 2016-03-28 DIAGNOSIS — E785 Hyperlipidemia, unspecified: Secondary | ICD-10-CM | POA: Diagnosis not present

## 2016-03-30 ENCOUNTER — Encounter: Payer: Self-pay | Admitting: Hematology

## 2016-03-31 ENCOUNTER — Encounter: Payer: Self-pay | Admitting: Hematology

## 2016-04-01 DIAGNOSIS — I509 Heart failure, unspecified: Secondary | ICD-10-CM | POA: Diagnosis not present

## 2016-04-01 DIAGNOSIS — I4891 Unspecified atrial fibrillation: Secondary | ICD-10-CM | POA: Diagnosis not present

## 2016-04-01 DIAGNOSIS — R296 Repeated falls: Secondary | ICD-10-CM | POA: Diagnosis not present

## 2016-04-01 DIAGNOSIS — E785 Hyperlipidemia, unspecified: Secondary | ICD-10-CM | POA: Diagnosis not present

## 2016-04-01 DIAGNOSIS — S93401D Sprain of unspecified ligament of right ankle, subsequent encounter: Secondary | ICD-10-CM | POA: Diagnosis not present

## 2016-04-01 DIAGNOSIS — I110 Hypertensive heart disease with heart failure: Secondary | ICD-10-CM | POA: Diagnosis not present

## 2016-04-02 DIAGNOSIS — I509 Heart failure, unspecified: Secondary | ICD-10-CM | POA: Diagnosis not present

## 2016-04-02 DIAGNOSIS — R296 Repeated falls: Secondary | ICD-10-CM | POA: Diagnosis not present

## 2016-04-02 DIAGNOSIS — S93401D Sprain of unspecified ligament of right ankle, subsequent encounter: Secondary | ICD-10-CM | POA: Diagnosis not present

## 2016-04-02 DIAGNOSIS — I4891 Unspecified atrial fibrillation: Secondary | ICD-10-CM | POA: Diagnosis not present

## 2016-04-02 DIAGNOSIS — I110 Hypertensive heart disease with heart failure: Secondary | ICD-10-CM | POA: Diagnosis not present

## 2016-04-02 DIAGNOSIS — E785 Hyperlipidemia, unspecified: Secondary | ICD-10-CM | POA: Diagnosis not present

## 2016-04-03 ENCOUNTER — Ambulatory Visit (HOSPITAL_COMMUNITY)
Admission: RE | Admit: 2016-04-03 | Discharge: 2016-04-03 | Disposition: A | Discharge status: Home / Self Care | Payer: Medicare Other | Source: Ambulatory Visit | Attending: Hematology | Admitting: Hematology

## 2016-04-03 ENCOUNTER — Encounter (HOSPITAL_COMMUNITY): Payer: Self-pay | Admitting: Radiology

## 2016-04-03 DIAGNOSIS — R918 Other nonspecific abnormal finding of lung field: Secondary | ICD-10-CM | POA: Diagnosis not present

## 2016-04-03 DIAGNOSIS — I110 Hypertensive heart disease with heart failure: Secondary | ICD-10-CM | POA: Diagnosis not present

## 2016-04-03 DIAGNOSIS — S93401D Sprain of unspecified ligament of right ankle, subsequent encounter: Secondary | ICD-10-CM | POA: Diagnosis not present

## 2016-04-03 DIAGNOSIS — I4891 Unspecified atrial fibrillation: Secondary | ICD-10-CM | POA: Diagnosis not present

## 2016-04-03 DIAGNOSIS — R296 Repeated falls: Secondary | ICD-10-CM | POA: Diagnosis not present

## 2016-04-03 DIAGNOSIS — I509 Heart failure, unspecified: Secondary | ICD-10-CM | POA: Diagnosis not present

## 2016-04-03 DIAGNOSIS — R935 Abnormal findings on diagnostic imaging of other abdominal regions, including retroperitoneum: Secondary | ICD-10-CM | POA: Diagnosis not present

## 2016-04-03 DIAGNOSIS — C251 Malignant neoplasm of body of pancreas: Secondary | ICD-10-CM | POA: Insufficient documentation

## 2016-04-03 DIAGNOSIS — E785 Hyperlipidemia, unspecified: Secondary | ICD-10-CM | POA: Diagnosis not present

## 2016-04-03 DIAGNOSIS — C259 Malignant neoplasm of pancreas, unspecified: Secondary | ICD-10-CM | POA: Diagnosis not present

## 2016-04-03 DIAGNOSIS — R599 Enlarged lymph nodes, unspecified: Secondary | ICD-10-CM | POA: Diagnosis not present

## 2016-04-03 LAB — GLUCOSE, CAPILLARY: Glucose-Capillary: 125 mg/dL — ABNORMAL HIGH (ref 65–99)

## 2016-04-03 MED ORDER — FLUDEOXYGLUCOSE F - 18 (FDG) INJECTION
7.6600 | Freq: Once | INTRAVENOUS | Status: AC | PRN
  Administered 2016-04-03: 7.66 via INTRAVENOUS

## 2016-04-04 DIAGNOSIS — I4891 Unspecified atrial fibrillation: Secondary | ICD-10-CM | POA: Diagnosis not present

## 2016-04-04 DIAGNOSIS — E785 Hyperlipidemia, unspecified: Secondary | ICD-10-CM | POA: Diagnosis not present

## 2016-04-04 DIAGNOSIS — S93401D Sprain of unspecified ligament of right ankle, subsequent encounter: Secondary | ICD-10-CM | POA: Diagnosis not present

## 2016-04-04 DIAGNOSIS — I509 Heart failure, unspecified: Secondary | ICD-10-CM | POA: Diagnosis not present

## 2016-04-04 DIAGNOSIS — I110 Hypertensive heart disease with heart failure: Secondary | ICD-10-CM | POA: Diagnosis not present

## 2016-04-04 DIAGNOSIS — R296 Repeated falls: Secondary | ICD-10-CM | POA: Diagnosis not present

## 2016-04-07 ENCOUNTER — Ambulatory Visit (HOSPITAL_BASED_OUTPATIENT_CLINIC_OR_DEPARTMENT_OTHER): Payer: Medicare Other | Admitting: Hematology

## 2016-04-07 ENCOUNTER — Telehealth: Payer: Self-pay | Admitting: Hematology

## 2016-04-07 VITALS — BP 104/56 | HR 69 | Temp 98.5°F | Resp 18 | Ht 64.0 in | Wt 150.0 lb

## 2016-04-07 DIAGNOSIS — R630 Anorexia: Secondary | ICD-10-CM | POA: Diagnosis not present

## 2016-04-07 DIAGNOSIS — R1013 Epigastric pain: Secondary | ICD-10-CM

## 2016-04-07 DIAGNOSIS — C251 Malignant neoplasm of body of pancreas: Secondary | ICD-10-CM

## 2016-04-07 DIAGNOSIS — R110 Nausea: Secondary | ICD-10-CM

## 2016-04-07 DIAGNOSIS — C7989 Secondary malignant neoplasm of other specified sites: Secondary | ICD-10-CM | POA: Diagnosis not present

## 2016-04-07 DIAGNOSIS — I611 Nontraumatic intracerebral hemorrhage in hemisphere, cortical: Secondary | ICD-10-CM

## 2016-04-07 DIAGNOSIS — I10 Essential (primary) hypertension: Secondary | ICD-10-CM

## 2016-04-07 DIAGNOSIS — S93491D Sprain of other ligament of right ankle, subsequent encounter: Secondary | ICD-10-CM | POA: Diagnosis not present

## 2016-04-07 MED ORDER — DRONABINOL 2.5 MG PO CAPS: 2.5000 mg | ORAL_CAPSULE | Freq: Two times a day (BID) | ORAL | 1 refills | Status: DC

## 2016-04-07 NOTE — Progress Notes (Signed)
[facility]  [phone] [phone]  Clinic Follow up Note   Patient Care Team: Velna Hatchet, MD as PCP - General (Internal Medicine) Isaias Cowman, MD as Consulting Physician (Internal Medicine) 04/07/2016  Referring physician: Velna Hatchet, MD  CHIEF COMPLAINTS:  Pancreatic cancer   HISTORY OF PRESENTING ILLNESS:  Ann Kaufman 82 y.o. female with past medical history of atrial fibrillation, hypertension, CHF diastolic, hemorrhagic CVA in 2014, and mild dementia is here because of her recent abnormal CT findings. She is accompanied by her daughter to my clinic today. She was referred by her primary care physician.  She had sprained ankle about 2-3 weeks ago, which was managed conservatively. During her physical therapy about 2 weeks ago, she started having dizziness when she stands up, and was found to be orthostatic hypotensive. She also reports intermittent abdominal pain, nausea, worsening fatigue, decreased appetite, and 10-15 lbs weight loss in the past few months. She was seen by her primary care physician Dr. Ardeth Perfect, and underwent CT abdomen and pelvis on 03/20/2016. The scan showed a 3.2 cm mass in the pancreatitis, with direct invasion into the spleen, measuring 4.8 cm. She was referred to [place] for further evaluation.  INTERIM HISTORY: Ann Kaufman returns for follow-up. She is accompanied by her daughter to the clinic today. She is clinically stable, has mild intermittent epigastric discomfort, mild nausea, and low appetite. She ate small meal. She is still able to function at home, she will complete physical therapy in 2 weeks. Her dizziness has slightly improved lately.  MEDICAL HISTORY:  Past Medical History:  Diagnosis Date  . Aphasia    Occasional aphasia & facial numbness from a left frontal lobe intercerebral hemorrhage on Xarelto on 05/18/13.  . Arthritis   . Atrial fibrillation ([place]) 2012   a. Dx in 2012;  b. Rhythm  controlled - amiodarone, previously anticoagulated with xarelto (d/c'd 04/2013 in setting of [place]);  b. 04/2013 Echo: EF 55-60%, Gr 1 DD, mild MR, mod dil LA, PAsP 90mmhg  . Depression   . Headache   . Hyperlipidemia   . Hypertension   . Intracerebral hemorrhage ([place])    a. 04/2013 in setting of xarelto therapy.  . Mitral valve prolapse   . Pacemaker   . PAF (paroxysmal atrial fibrillation) ([place])   . Palpitations    Occasional.  . Pericardial effusion   . Peripheral edema    Because she has been standing on her feet more than usual   . Presence of permanent cardiac pacemaker    a. 2012 MDT, placed in [place] (probable tachy-brady).  . Rheumatoid arthritis ([place])   . SOB (shortness of breath)    Occasional SOB  . SSS (sick sinus syndrome) ([facility])    s/p dual chamber pacemaker    SURGICAL HISTORY: Past Surgical History:  Procedure Laterality Date  . ABDOMINAL HYSTERECTOMY    . APPENDECTOMY    . BOWEL RESECTION    . BREAST LUMPECTOMY    . LEAD REVISION  09/17/11   s/p atrial lead revision  . PACEMAKER INSERTION     MDT implanted at [place]: Social History   Social History  . Marital status: Widowed    Spouse name: Eddie Dibbles  . Number of children: 3  . Years of education: 12   Occupational History  . retired    Social History Main Topics  . Smoking status: Former Smoker    Packs/day: 0.25    Years: 30.00  Quit date: 11/26/1968  . Smokeless tobacco: Never Used  . Alcohol use Yes     Comment: wine on occasionally  . Drug use:     Types: Other-see comments  . Sexual activity: Not on file   Other Topics Concern  . Not on file   Social History Narrative   Lives in [place] by herself.  Daughter lives in [place].    FAMILY HISTORY: Family History  Problem Relation Age of Onset  . Stroke Father   . Aneurysm Mother   . Other      negative for premature CAD.  Marland Kitchen Cancer Sister 109    esophageal cancer   . Cancer Other     nephew had lung and kidney  cancer     ALLERGIES:  has No Known Allergies.  MEDICATIONS:  Current Outpatient Prescriptions  Medication Sig Dispense Refill  . amiodarone (PACERONE) 200 MG tablet Take 0.5 tablets (100 mg total) by mouth every morning.    Marland Kitchen aspirin 325 MG EC tablet Take 325 mg by mouth daily.     . diclofenac sodium (VOLTAREN) 1 % GEL Apply 1 application topically 3 (three) times daily as needed (pain).    Marland Kitchen ergocalciferol (VITAMIN D2) 50000 units capsule TAKE 1 CAPSULE BY MOUTH EVERY 14 DAYS    . famotidine (PEPCID) 20 MG tablet Take 1 tablet (20 mg total) by mouth 2 (two) times daily.    . folic acid (FOLVITE) A999333 MCG tablet Take 1 tablet (400 mcg total) by mouth every evening. 30 tablet 4  . levETIRAcetam (KEPPRA) 500 MG tablet TAKE 1 TABLET TWICE A DAY 180 tablet 0  . metoprolol tartrate (LOPRESSOR) 25 MG tablet Take 1 tablet (25 mg total) by mouth 2 (two) times daily. 60 tablet 1  . senna (SENOKOT) 8.6 MG TABS tablet Take 2 tablets (17.2 mg total) by mouth at bedtime. 120 each 0  . sertraline (ZOLOFT) 50 MG tablet TAKE 50 MG BY MOUTH AT BEDTIME 90 tablet 3  . simvastatin (ZOCOR) 10 MG tablet TAKE 10 MG BY MOUTH AT BEDTIME 90 tablet 3  . traMADol (ULTRAM) 50 MG tablet Take 1 tablet (50 mg total) by mouth every 8 (eight) hours as needed. 30 tablet 0  . vitamin B-12 (CYANOCOBALAMIN) 1000 MCG tablet Take 1 tablet (1,000 mcg total) by mouth every evening. 30 tablet 1   No current facility-administered medications for this visit.     REVIEW OF SYSTEMS:   Constitutional: Denies fevers, chills or abnormal night sweats Eyes: Denies blurriness of vision, double vision or watery eyes Ears, nose, mouth, throat, and face: Denies mucositis or sore throat Respiratory: Denies cough, dyspnea or wheezes Cardiovascular: Denies palpitation, chest discomfort or lower extremity swelling Gastrointestinal:  Denies nausea, heartburn or change in bowel habits Skin: Denies abnormal skin rashes Lymphatics: Denies new  lymphadenopathy or easy bruising Neurological:Denies numbness, tingling or new weaknesses Behavioral/Psych: Mood is stable, no new changes  All other systems were reviewed with the patient and are negative.  PHYSICAL EXAMINATION: ECOG PERFORMANCE STATUS: 2 - Symptomatic, <50% confined to bed  Vitals:   04/07/16 1432  BP: (!) 104/56  Pulse: 69  Resp: 18  Temp: 98.5 F (36.9 C)   Filed Weights   04/07/16 1432  Weight: 150 lb (68 kg)    GENERAL:alert, no distress and comfortable SKIN: skin color, texture, turgor are normal, no rashes or significant lesions EYES: normal, conjunctiva are pink and non-injected, sclera clear OROPHARYNX:no exudate, no erythema and lips, buccal mucosa,  and tongue normal  NECK: supple, thyroid normal size, non-tender, without nodularity LYMPH:  no palpable lymphadenopathy in the cervical, axillary or inguinal LUNGS: clear to auscultation and percussion with normal breathing effort HEART: regular rate & rhythm and no murmurs and no lower extremity edema ABDOMEN:abdomen soft, Mild tenderness in the epigastric area, no organomegaly, normal bowel sounds Musculoskeletal:no cyanosis of digits and no clubbing  PSYCH: alert & oriented x 3 with fluent speech NEURO: no focal motor/sensory deficits  LABORATORY DATA:  I have reviewed the data as listed CBC Latest Ref Rng & Units 03/12/2016 03/12/2016 03/14/2015  WBC 4.0 - 10.5 K/uL - 11.5(H) 5.2  Hemoglobin 12.0 - 15.0 g/dL 15.0 13.4 12.0  Hematocrit 36.0 - 46.0 % 44.0 42.0 36.6  Platelets 150 - 400 K/uL - 212 123(L)   CMP Latest Ref Rng & Units 03/12/2016 03/12/2016 03/14/2015  Glucose 65 - 99 mg/dL 72 75 91  BUN 6 - 20 mg/dL 26(H) 18 23(H)  Creatinine 0.44 - 1.00 mg/dL 1.20(H) 1.30(H) 1.15(H)  Sodium 135 - 145 mmol/L 137 136 138  Potassium 3.5 - 5.1 mmol/L 4.2 4.2 4.2  Chloride 101 - 111 mmol/L 98(L) 100(L) 104  CO2 22 - 32 mmol/L - 27 24  Calcium 8.9 - 10.3 mg/dL - 9.2 9.0  Total Protein 6.5 - 8.1 g/dL -  7.0 -  Total Bilirubin 0.3 - 1.2 mg/dL - 0.9 -  Alkaline Phos 38 - 126 U/L - 338(H) -  AST 15 - 41 U/L - 42(H) -  ALT 14 - 54 U/L - 28 -   Results for DAILA, KOETTER (MRN [id number]) as of 04/08/2016 23:21  Ref. Range 03/25/2016 12:22 03/25/2016 12:22  CA 19-9 Latest Ref Range: 0 - 35 U/mL 25,773 (H)   CEA ([facility]) Latest Ref Range: 0.00 - 5.00 ng/mL  8.19 (H)     RADIOGRAPHIC STUDIES: I have personally reviewed the radiological images as listed and agreed with the findings in the report. Dg Ankle Complete Right  Result Date: 03/12/2016 CLINICAL DATA:  Fall with right ankle pain and swelling. Initial encounter. EXAM: RIGHT ANKLE - COMPLETE 3+ VIEW COMPARISON:  05/28/2013 FINDINGS: Soft tissue swelling greatest about the lateral malleolus. On AP view there is subtle lateral malleolus cortical lucency but on the oblique view no fracture is noted. No dislocation. Heel, medial malleolus, and talonavicular spurs.  Osteopenia. IMPRESSION: Soft tissue swelling without acute osseous finding. Electronically Signed   By: Monte Fantasia M.D.   On: 03/12/2016 16:06   Ct Head Wo Contrast  Result Date: 03/12/2016 CLINICAL DATA:  [place].  Hit head. EXAM: CT HEAD WITHOUT CONTRAST TECHNIQUE: Contiguous axial images were obtained from the base of the skull through the vertex without intravenous contrast. COMPARISON:  12/03/2014 FINDINGS: Brain: Stable age related cerebral atrophy, ventriculomegaly and periventricular white matter disease. No extra-axial fluid collections are identified. No CT findings for acute hemispheric infarction or intracranial hemorrhage. Remote encephalomalacia in the left frontal area. No mass lesions. The brainstem and cerebellum are normal. Vascular: Minimal stable vascular calcifications. No hyperdense vessels. Skull: No skull fracture or bone lesion. Mild hyperostosis frontalis interna. Sinuses/Orbits: The paranasal sinuses and mastoid air cells are clear. The globes are intact.  Other: No scalp lesions or hematoma. IMPRESSION: Stable age related cerebral atrophy, ventriculomegaly and periventricular white matter disease. Remote area of encephalomalacia in the left frontal area. No acute intracranial findings or skull fracture. Electronically Signed   By: Marijo Sanes M.D.   On: 03/12/2016 16:17  Ct Abdomen Pelvis W Contrast  Addendum Date: 03/20/2016   ADDENDUM REPORT: 03/20/2016 13:24 ADDENDUM: The findings were called to Dr. Virgina Jock on 03/20/2016 1:15 p.m. Electronically Signed   By: Nolon Nations M.D.   On: 03/20/2016 13:24   Result Date: 03/20/2016 CLINICAL DATA:  Alkaline phosphate elevation, pelvic pain, lower abdominal pain with bloating for 2 weeks. No hx of cancer, teratoma tumor of the abdomen with hysterectomy. Previous: epic.^46mL ISOVUE-300 IOPAMIDOL (ISOVUE-300) INJECTION 61% EXAM: CT ABDOMEN AND PELVIS WITH CONTRAST TECHNIQUE: Multidetector CT imaging of the abdomen and pelvis was performed using the standard protocol following bolus administration of intravenous contrast. CONTRAST:  2mL ISOVUE-300 IOPAMIDOL (ISOVUE-300) INJECTION 61% COMPARISON:  08/07/2014, 07/07/2006 FINDINGS: Lower chest: No pulmonary nodules, pleural effusions, or infiltrates. Patient has pacer leads to the right atrium and right ventricle. Mitral annulus repair. Hepatobiliary: Significant intra and extrahepatic biliary duct dilatation. Common bile duct is 11 mm. No definite liver lesions are identified. Within the porta hepatis, there is ill-defined density suspicious for tumor. The gallbladder is present and appears distended. No calcified gallstones are present. The distal common bile duct tapers abruptly in the region of the pancreatic head. Pancreas: Within the region the pancreatic tail, there is low-attenuation possible mass measuring 1.8 x 3.2 cm. This portion of the pancreas is now contiguous with the splenic hilum, a change compared with prior studies. Adjacent to the solid mass,  there is a focal area of pancreatic duct dilatation. Spleen: Spleen is enlarged. Within the splenic hilum there is a new low-attenuation lesion measuring 4.1 x 4.8 x 4.5 cm. The lesion is contiguous with the pancreatic tail. Adrenals/Urinary Tract: Adrenal glands are normal. Numerous bilateral renal cysts are identified. No hydronephrosis. Left extrarenal pelvis again noted. Stomach/Bowel: Small hiatal hernia is present. The stomach is normal in appearance. There is partial malrotation of the small bowel with proximal jejunal loops descending in the right mid abdomen. Otherwise the small bowel are normal in caliber and wall thickness. There is a large amount of stool in the colon. Colonic wall is otherwise normal. Vascular/Lymphatic: There is adenopathy in the region of the porta hepatis and portacaval region. Porta hepatis node is 1.2 cm. The celiac node is 1.4 cm. Portacaval node is 1.5 cm. Reproductive: The uterus is absent. Urinary bladder has a normal appearance. No adnexal mass. Trace free pelvic fluid. Moderate atherosclerotic calcification of the abdominal aorta. Other: Small ill-defined densities are identified within the mesentery. Largest is 1.5 cm on image 48 of series 2. A second is 1.1 cm on image 62 of series 2. These may represent small lymph nodes or mesenteric tumor. Musculoskeletal: Moderate degenerative changes are identified in the lower thoracic and lumbar spine. No suspicious lytic or blastic lesions are identified. IMPRESSION: 1. Significant intra and extrahepatic biliary duct dilatation. Abrupt tapering of the distal common bile duct suspicious for malignant stricture. 2. Low-attenuation lesion in the pancreatic tail suspicious for malignancy. Adjacent splenic lesion suspicious for metastasis. 3. Soft tissue and adenopathy in the porta hepatis. 4. Retroperitoneal and peripancreatic adenopathy. Possible mesenteric lymphadenopathy or tumor. 5. Further evaluation with MRI is recommended. MRI  should be performed when the patient is clinically stable and able to follow breath holding instructions (usually best performed on an outpatient basis). 6. Small hiatal hernia. 7.  Aortic atherosclerosis. 8. Status post hysterectomy. Multiple attempts have been made contact the patient's provider. We will continue to try contracting the patient's care team. In the meantime, a radiologist can be reached at [phone].  Electronically Signed: By: Nolon Nations M.D. On: 03/20/2016 13:07   Nm Pet Image Initial (pi) Skull Base To Thigh  Result Date: 04/03/2016 CLINICAL DATA:  Initial treatment strategy for pancreatic cancer. EXAM: NUCLEAR MEDICINE PET SKULL BASE TO THIGH TECHNIQUE: 7.7 mCi F-18 FDG was injected intravenously. Full-ring PET imaging was performed from the skull base to thigh after the radiotracer. CT data was obtained and used for attenuation correction and anatomic localization. FASTING BLOOD GLUCOSE:  Value: 125 mg/dl COMPARISON:  None. FINDINGS: NECK No hypermetabolic lymph nodes in the neck. CHEST No hypermetabolic mediastinal or hilar nodes. No suspicious pulmonary nodules on the CT scan. Left-sided permanent pacemaker noted. Atherosclerotic calcifications seen in the wall of the thoracic aorta. Lung windows reveal a tubular branching opacity in the posterior right upper lobe (image 23 series 8) measuring roughly 15 x 15 mm. Given the apparent brain seen configuration, this is likely related to airway impaction. Changes of emphysema noted. Otherwise no suspicious pulmonary nodule or mass. ABDOMEN/PELVIS Hypermetabolism is identified in the region of the pancreatic head/ porta hepatis with SUV max = 6.7. Previously described hypo attenuating pancreatic tail lesion is also hypermetabolic with SUV max = 8.6. Pancreatic tail lesion is contiguous with the low-attenuation splenic lesion measured 4.8 cm on the previous diagnostic CT scan. This splenic lesion is hypermetabolic with SUV max = 15. 8  mm short axis retroperitoneal lymph node, just posterior and superior to the pancreas is hypermetabolic with SUV max = 4.5. There is a amorphous soft tissue associated with an apparently encasing the celiac axis (image 97 series 4) which demonstrates hypermetabolism and SUV max = 5.2. 10 mm low left omental nodule (image 144 series 4) is hypermetabolic with SUV max = 4.2. 7 mm short axis left groin lymph node shows low level FDG uptake with SUV max = 2.6 and may be reactive. Gallbladder is distended. Abdominal aortic atherosclerosis noted without aneurysm. SKELETON No focal hypermetabolic activity to suggest skeletal metastasis. IMPRESSION: 1. Hypermetabolic hypo attenuating mass in the tail the pancreas with contiguous associated hypermetabolic lesion involving the spleen. 2. Focal hypermetabolism identified in the region of the pancreatic head/hepatoduodenal ligament. 3. Hypermetabolic amorphous soft tissue encasing the celiac axis. 4. Hypermetabolic low left omental nodule associated with a hypermetabolic left periaortic abdominal lymph node. 5. Right upper lobe nodular opacity has a branching configuration. This shows very low level FDG uptake and suggests airway impaction. Attention on follow-up recommended. Electronically Signed   By: Misty Stanley M.D.   On: 04/03/2016 14:13    ASSESSMENT & PLAN:  82 year old Caucasian female, with multiple comorbidities, presents with worsening abdominal pain, nausea, fatigue, anorexia, and weight loss, and orthostatic hypotension.  1. Pancreatic tail mass, with direct invasion to spleen, likely pancreatic cancer, with node and possible peritoneal metastasis -I reviewed her CT scan images with patient and her daughter in great detail. The pancreatic to mass is highly suspicious for malignancy, especially adenocarcinoma. It appears has directly invading the spleen, possible surrounding adenopathy. -I reviewed her PET scan findings which showed hypermetabolic pancreatic  tail mass with spleen invasion, hypermetabolic soft tissue versus lymph node in the pancreatic head and and hepatoduodenal ligament, and a possible peritoneum or lymph node metastasis -Her CA 19.9 is significantly elevated, with the above scan findings, this is most consistent with pancreatic adenocarcinoma -We discussed the options of biopsy. Unfortunately percutaneous biopsy may not be feasible, endoscopy with EUS biopsy is needed to get tissue diagnosis, which may require general anesthesia. Given her advanced  age, comorbidities, and really no treatment options even after diagnosis, I do not strongly feel she needs the tissue biopsy to confirm the diagnosis. Patient and her daughter agrees  -The patient is not a candidate for chemotherapy. We discussed the goal of therapy is palliative, to preserve or improve her quality of life  -I recommend her to consider hospice, she declined.  -I'll continue manage her symptoms and follow-up.   2. Nausea, anorexia, and epigastric Discomfort  -We discussed different options for appetite stimulator. Given her multiple GI symptoms, especially her concern of low appetite, I recommend her to try Marinol 2.5 mg twice daily, and titrated dose as needed. Prescription was given to her today  -She will continue tramadol as needed for pain   2. Atrial fibrillation, hypertension, diastolic CHF, please and orthostatic hypotension -She will continue follow-up with her primary care physician -She is not on anticoagulation due to her prior history of hemorrhagic stroke when she was on Xarelto  -Her medication has been adjusted due to her orthostatic hypertension. I encouraged her to drink more fluids.  3. History of TIA and hemorrhagic stroke  4. Mild dementia   Plan -We decided not to pursue tissue biopsy given the risk of procedure  -She will try Marinol for her nausea and anorexia  -I'll see her back in 3 weeks for follow-up    All questions were answered. The  patient knows to call the clinic with any problems, questions or concerns. I spent 30 minutes counseling the patient face to face. The total time spent in the appointment was 35 minutes and more than 50% was on counseling.     Truitt Merle, MD 04/07/2016

## 2016-04-07 NOTE — Telephone Encounter (Signed)
Gave patient avs report and appointments for November.  °

## 2016-04-08 ENCOUNTER — Encounter: Payer: Self-pay | Admitting: Hematology

## 2016-04-08 DIAGNOSIS — I509 Heart failure, unspecified: Secondary | ICD-10-CM | POA: Diagnosis not present

## 2016-04-08 DIAGNOSIS — E785 Hyperlipidemia, unspecified: Secondary | ICD-10-CM | POA: Diagnosis not present

## 2016-04-08 DIAGNOSIS — I4891 Unspecified atrial fibrillation: Secondary | ICD-10-CM | POA: Diagnosis not present

## 2016-04-08 DIAGNOSIS — R296 Repeated falls: Secondary | ICD-10-CM | POA: Diagnosis not present

## 2016-04-08 DIAGNOSIS — I110 Hypertensive heart disease with heart failure: Secondary | ICD-10-CM | POA: Diagnosis not present

## 2016-04-08 DIAGNOSIS — S93401D Sprain of unspecified ligament of right ankle, subsequent encounter: Secondary | ICD-10-CM | POA: Diagnosis not present

## 2016-04-09 ENCOUNTER — Encounter: Payer: Self-pay | Admitting: Hematology

## 2016-04-09 ENCOUNTER — Emergency Department (HOSPITAL_COMMUNITY)
Admission: EM | Admit: 2016-04-09 | Discharge: 2016-04-09 | Disposition: A | Discharge status: Home / Self Care | Payer: Medicare Other | Attending: Emergency Medicine | Admitting: Emergency Medicine

## 2016-04-09 ENCOUNTER — Emergency Department (HOSPITAL_COMMUNITY): Payer: Medicare Other

## 2016-04-09 ENCOUNTER — Encounter: Payer: Self-pay | Admitting: Internal Medicine

## 2016-04-09 ENCOUNTER — Encounter (HOSPITAL_COMMUNITY): Payer: Self-pay | Admitting: Emergency Medicine

## 2016-04-09 DIAGNOSIS — S0093XA Contusion of unspecified part of head, initial encounter: Secondary | ICD-10-CM | POA: Diagnosis not present

## 2016-04-09 DIAGNOSIS — Y939 Activity, unspecified: Secondary | ICD-10-CM | POA: Diagnosis not present

## 2016-04-09 DIAGNOSIS — R296 Repeated falls: Secondary | ICD-10-CM | POA: Diagnosis not present

## 2016-04-09 DIAGNOSIS — I110 Hypertensive heart disease with heart failure: Secondary | ICD-10-CM | POA: Insufficient documentation

## 2016-04-09 DIAGNOSIS — Y999 Unspecified external cause status: Secondary | ICD-10-CM | POA: Insufficient documentation

## 2016-04-09 DIAGNOSIS — I5032 Chronic diastolic (congestive) heart failure: Secondary | ICD-10-CM | POA: Diagnosis not present

## 2016-04-09 DIAGNOSIS — Z87891 Personal history of nicotine dependence: Secondary | ICD-10-CM | POA: Diagnosis not present

## 2016-04-09 DIAGNOSIS — S93401D Sprain of unspecified ligament of right ankle, subsequent encounter: Secondary | ICD-10-CM | POA: Diagnosis not present

## 2016-04-09 DIAGNOSIS — S0990XA Unspecified injury of head, initial encounter: Secondary | ICD-10-CM | POA: Insufficient documentation

## 2016-04-09 DIAGNOSIS — W1830XA Fall on same level, unspecified, initial encounter: Secondary | ICD-10-CM | POA: Insufficient documentation

## 2016-04-09 DIAGNOSIS — Z950 Presence of cardiac pacemaker: Secondary | ICD-10-CM | POA: Insufficient documentation

## 2016-04-09 DIAGNOSIS — Z7982 Long term (current) use of aspirin: Secondary | ICD-10-CM | POA: Diagnosis not present

## 2016-04-09 DIAGNOSIS — Z7901 Long term (current) use of anticoagulants: Secondary | ICD-10-CM | POA: Insufficient documentation

## 2016-04-09 DIAGNOSIS — I509 Heart failure, unspecified: Secondary | ICD-10-CM | POA: Diagnosis not present

## 2016-04-09 DIAGNOSIS — S299XXA Unspecified injury of thorax, initial encounter: Secondary | ICD-10-CM | POA: Diagnosis not present

## 2016-04-09 DIAGNOSIS — Y92009 Unspecified place in unspecified non-institutional (private) residence as the place of occurrence of the external cause: Secondary | ICD-10-CM | POA: Diagnosis not present

## 2016-04-09 DIAGNOSIS — S6991XA Unspecified injury of right wrist, hand and finger(s), initial encounter: Secondary | ICD-10-CM | POA: Diagnosis not present

## 2016-04-09 DIAGNOSIS — R55 Syncope and collapse: Secondary | ICD-10-CM | POA: Diagnosis not present

## 2016-04-09 DIAGNOSIS — I951 Orthostatic hypotension: Secondary | ICD-10-CM

## 2016-04-09 DIAGNOSIS — I4891 Unspecified atrial fibrillation: Secondary | ICD-10-CM | POA: Diagnosis not present

## 2016-04-09 DIAGNOSIS — Z8673 Personal history of transient ischemic attack (TIA), and cerebral infarction without residual deficits: Secondary | ICD-10-CM | POA: Diagnosis not present

## 2016-04-09 DIAGNOSIS — Z8507 Personal history of malignant neoplasm of pancreas: Secondary | ICD-10-CM | POA: Insufficient documentation

## 2016-04-09 DIAGNOSIS — S098XXA Other specified injuries of head, initial encounter: Secondary | ICD-10-CM | POA: Diagnosis not present

## 2016-04-09 DIAGNOSIS — E785 Hyperlipidemia, unspecified: Secondary | ICD-10-CM | POA: Diagnosis not present

## 2016-04-09 LAB — I-STAT TROPONIN, ED: Troponin i, poc: 0.01 ng/mL (ref 0.00–0.08)

## 2016-04-09 LAB — I-STAT CHEM 8, ED
BUN: 17 mg/dL (ref 6–20)
CREATININE: 1.2 mg/dL — AB (ref 0.44–1.00)
Calcium, Ion: 1.15 mmol/L (ref 1.15–1.40)
Chloride: 96 mmol/L — ABNORMAL LOW (ref 101–111)
GLUCOSE: 82 mg/dL (ref 65–99)
HCT: 40 % (ref 36.0–46.0)
HEMOGLOBIN: 13.6 g/dL (ref 12.0–15.0)
POTASSIUM: 3.7 mmol/L (ref 3.5–5.1)
Sodium: 135 mmol/L (ref 135–145)
TCO2: 28 mmol/L (ref 0–100)

## 2016-04-09 NOTE — ED Notes (Addendum)
Pt became agitated when I asked her to put on gown. She is refusing to allow me to take any vitals, stating, "you just want people to think that you're good at your job". Pt is not making much sense and saying random phrases and words.

## 2016-04-09 NOTE — ED Triage Notes (Signed)
Pt from home via [facility] with c/o head injury and right hand skin tear s/p falling into her dresser after being dizzy.  Pt at baseline per advanced home care staff with hx of dementia.  Pt daughter reports pt has a hx of hypotention with dizziness.  Pt was orthostatic 100/60 laying, 90/64 sitting, and 70 palpated standing.  Pt refused IV with EMS.  Per tech pt is refusing to have vital signs be taken on arrival.  NAD, A&O.

## 2016-04-09 NOTE — ED Provider Notes (Signed)
[facility] DEPT Provider Note   CSN: [id number] Arrival date & time: 04/09/16  1328     History   Chief Complaint Chief Complaint  Patient presents with  . Fall  . Head Injury  . Dizziness    HPI Ann Kaufman is a 82 y.o. female.  Patient is an 82 year old female with a history of atrial fibrillation who is no longer taking anticoagulation, hypertension, hyperlipidemia, pacemaker for sick sinus syndrome who presents today after a syncopal episode and fall. Patient states she stood up quickly and started feeling lightheaded and dizzy and then woke up on the floor. Patient's home health aide called 911. Patient states she just wants to go home she has no headache, neck pain, chest pain, palpitations or shortness of breath. She denies any unilateral leg pain or swelling. She states she has had syncope in the past and often will feel lightheaded with standing quickly.   The history is provided by the patient and the EMS personnel.  Fall  This is a recurrent problem. Episode onset: earlier today but unsure what time. Episode frequency: brief. The problem has been resolved. Pertinent negatives include no chest pain, no abdominal pain, no headaches and no shortness of breath. Associated symptoms comments: No palpitations. The symptoms are aggravated by standing and walking. The symptoms are relieved by lying down. She has tried nothing for the symptoms.  Head Injury    Dizziness  Associated symptoms: no chest pain, no headaches and no shortness of breath     Past Medical History:  Diagnosis Date  . Aphasia    Occasional aphasia & facial numbness from a left frontal lobe intercerebral hemorrhage on Xarelto on 05/18/13.  . Arthritis   . Atrial fibrillation ([place]) 2012   a. Dx in 2012;  b. Rhythm controlled - amiodarone, previously anticoagulated with xarelto (d/c'd 04/2013 in setting of [place]);  b. 04/2013 Echo: EF 55-60%, Gr 1 DD, mild MR, mod dil LA, PAsP 66mmhg  . Depression   .  Headache   . Hyperlipidemia   . Hypertension   . Intracerebral hemorrhage ([place])    a. 04/2013 in setting of xarelto therapy.  . Mitral valve prolapse   . Pacemaker   . PAF (paroxysmal atrial fibrillation) ([place])   . Palpitations    Occasional.  . Pericardial effusion   . Peripheral edema    Because she has been standing on her feet more than usual   . Presence of permanent cardiac pacemaker    a. 2012 MDT, placed in [place] (probable tachy-brady).  . Rheumatoid arthritis ([place])   . SOB (shortness of breath)    Occasional SOB  . SSS (sick sinus syndrome) ([facility])    s/p dual chamber pacemaker    Patient Active Problem List   Diagnosis Date Noted  . Pancreatic cancer ([place]) 03/25/2016  . Osteoarthritis of right knee 03/19/2015  . Trochanteric bursitis of both hips 03/19/2015  . Gait disorder 03/19/2015  . Hyperlipidemia 12/08/2014  . Acute right-sided weakness 12/02/2014  . Pulmonary HTN 12/02/2014  . DOE (dyspnea on exertion) 12/02/2014  . Chronic diastolic heart failure, NYHA class 1 ([place]) 12/02/2014  . TIA (transient ischemic attack) 12/02/2014  . Cerebral infarction due to unspecified mechanism   . Myofascial muscle pain 03/20/2014  . Sick sinus syndrome ([place]) 10/19/2013  . Seizure disorder after stroke 07/24/2013  . Syncope 05/30/2013  . Pacemaker 05/30/2013  . Chronic anticoagulation 05/19/2013  . History of ICH (intracerebral hemorrhage) 05/18/2013  . Atrial fibrillation, chronic (  [place]) 04/06/2013  . Atherosclerosis of aorta ([place]) 04/06/2013  . Pleural effusion 11/27/2011  . HTN (hypertension) 11/27/2011    Past Surgical History:  Procedure Laterality Date  . ABDOMINAL HYSTERECTOMY    . APPENDECTOMY    . BOWEL RESECTION    . BREAST LUMPECTOMY    . LEAD REVISION  09/17/11   s/p atrial lead revision  . PACEMAKER INSERTION     MDT implanted at [facility]    OB History    No data available       Home Medications    Prior to Admission medications   Medication  Sig Start Date End Date Taking? Authorizing Provider  amiodarone (PACERONE) 200 MG tablet Take 0.5 tablets (100 mg total) by mouth every morning. 05/23/15   Meredith Staggers, MD  aspirin 325 MG EC tablet Take 325 mg by mouth daily.     Historical Provider, MD  diclofenac sodium (VOLTAREN) 1 % GEL Apply 1 application topically 3 (three) times daily as needed (pain).    Historical Provider, MD  dronabinol (MARINOL) 2.5 MG capsule Take 1 capsule (2.5 mg total) by mouth 2 (two) times daily before a meal. 04/07/16   Truitt Merle, MD  ergocalciferol (VITAMIN D2) 50000 units capsule TAKE 1 CAPSULE BY MOUTH EVERY 14 DAYS    Historical Provider, MD  famotidine (PEPCID) 20 MG tablet Take 1 tablet (20 mg total) by mouth 2 (two) times daily. 06/08/13   Bary Leriche, PA-C  folic acid (FOLVITE) A999333 MCG tablet Take 1 tablet (400 mcg total) by mouth every evening. 05/23/15   Meredith Staggers, MD  levETIRAcetam (KEPPRA) 500 MG tablet TAKE 1 TABLET TWICE A DAY 02/18/16   Meredith Staggers, MD  metoprolol tartrate (LOPRESSOR) 25 MG tablet Take 1 tablet (25 mg total) by mouth 2 (two) times daily. 05/23/15   Meredith Staggers, MD  senna (SENOKOT) 8.6 MG TABS tablet Take 2 tablets (17.2 mg total) by mouth at bedtime. 06/08/13   Ivan Anchors Love, PA-C  sertraline (ZOLOFT) 50 MG tablet TAKE 50 MG BY MOUTH AT BEDTIME 05/23/15   Meredith Staggers, MD  simvastatin (ZOCOR) 10 MG tablet TAKE 10 MG BY MOUTH AT BEDTIME 05/23/15   Meredith Staggers, MD  traMADol (ULTRAM) 50 MG tablet Take 1 tablet (50 mg total) by mouth every 8 (eight) hours as needed. 03/25/16   Truitt Merle, MD  vitamin B-12 (CYANOCOBALAMIN) 1000 MCG tablet Take 1 tablet (1,000 mcg total) by mouth every evening. 05/23/15   Meredith Staggers, MD    Family History Family History  Problem Relation Age of Onset  . Stroke Father   . Aneurysm Mother   . Other      negative for premature CAD.  Marland Kitchen Cancer Sister 35    esophageal cancer   . Cancer Other     nephew had lung and  kidney cancer     Social History Social History  Substance Use Topics  . Smoking status: Former Smoker    Packs/day: 0.25    Years: 30.00    Quit date: 11/26/1968  . Smokeless tobacco: Never Used  . Alcohol use Yes     Comment: wine on occasionally     Allergies   Review of patient's allergies indicates no known allergies.   Review of Systems Review of Systems  Respiratory: Negative for shortness of breath.   Cardiovascular: Negative for chest pain.  Gastrointestinal: Negative for abdominal pain.  Neurological: Positive for dizziness. Negative for  headaches.  All other systems reviewed and are negative.    Physical Exam Updated Vital Signs BP 129/56   Pulse 64   Resp 18   SpO2 99%   Physical Exam  Constitutional: She appears well-developed and well-nourished. No distress.  HENT:  Head: Normocephalic and atraumatic.  Mouth/Throat: Oropharynx is clear and moist.  Eyes: Conjunctivae and EOM are normal. Pupils are equal, round, and reactive to light.  Neck: Normal range of motion. Neck supple.  Cardiovascular: Normal rate, regular rhythm and intact distal pulses.   No murmur heard. Pulmonary/Chest: Effort normal and breath sounds normal. No respiratory distress. She has no wheezes. She has no rales.  Abdominal: Soft. She exhibits no distension. There is no tenderness. There is no rebound and no guarding.  Musculoskeletal: Normal range of motion. She exhibits no edema or tenderness.  Neurological: She is alert. She has normal strength. No cranial nerve deficit or sensory deficit.  Oriented to person and place. Mild dementia present (some of her stories and timelines do not line up)  Skin: Skin is warm and dry. No rash noted. No erythema.  Psychiatric: She has a normal mood and affect. Her behavior is normal.  Nursing note and vitals reviewed.    ED Treatments / Results  Labs (all labs ordered are listed, but only abnormal results are displayed) Labs Reviewed    I-STAT CHEM 8, ED - Abnormal; Notable for the following:       Result Value   Chloride 96 (*)    Creatinine, Ser 1.20 (*)    All other components within normal limits  I-STAT TROPOININ, ED    EKG  EKG Interpretation  Date/Time:  Wednesday April 09 2016 14:12:57 EDT Ventricular Rate:  64 PR Interval:    QRS Duration: 91 QT Interval:  481 QTC Calculation: 497 R Axis:   -60 Text Interpretation:  Atrial-paced rhythm Inferior infarct, old Anterior infarct, old No significant change since last tracing Confirmed by Maryan Rued  MD, Loree Fee ([id number]) on 04/09/2016 2:23:22 PM Also confirmed by Maryan Rued  MD, Lizanne Erker ([id number]), editor [place], Joelene Millin [id number])  on 04/09/2016 2:35:21 PM       Radiology Dg Chest Port 1 View  Result Date: 04/09/2016 CLINICAL DATA:  82 year old female status post fall at home. Syncope. Head injury. Initial encounter. EXAM: PORTABLE CHEST 1 VIEW COMPARISON:  03/14/2015 and earlier. FINDINGS: Portable AP semi upright view at 1420 hours. Lower lung volumes. Allowing for portable technique the lungs are clear. No pneumothorax or pleural effusion. Stable left chest cardiac pacemaker. Normal cardiac size and mediastinal contours. Calcified aortic atherosclerosis. No acute osseous abnormality identified. IMPRESSION: No acute cardiopulmonary abnormality or acute traumatic injury identified. Electronically Signed   By: Genevie Ann M.D.   On: 04/09/2016 14:28    Procedures Procedures (including critical care time)  Medications Ordered in ED Medications - No data to display   Initial Impression / Assessment and Plan / ED Course  I have reviewed the triage vital signs and the nursing notes.  Pertinent labs & imaging results that were available during my care of the patient were reviewed by me and considered in my medical decision making (see chart for details).  Clinical Course    Patient is a 82 year old female with a syncopal episode today and fall. She describes it as an  orthostatic event where she stood up quickly felt lightheaded and then woke up on the floor. She currently denies any symptoms. This has been something that has been ongoing for some  time. She was actually seen at the end of September for similar event. At that time evaluation was negative. EKG today shows a paced rhythm. Chest x-ray without acute findings. Exam is within normal limits. Hemoglobin, electrolytes and renal function are within normal limits. Pacemaker was interrogated during patient's last visit less than a month ago with no signs of dysrhythmia. Patient did have orthostasis when EMS checked orthostatics. This time feel the patient is safe for discharge home.  3:29 PM Labs without significant findings. Patient's daughter arrives and states this is been an ongoing issue for a while. She has known orthostatic hypotension. She recently stopped her Lasix however symptoms are not improved. Patient's blood pressure runs on the low side between 101 13 over 70s. Heart rates are typically in the 60s. Discussed with him calling the cardiologist and PCP about potentially having her metoprolol to see if that improves with her symptoms. She is also working with physical therapy. This time feel patient is safe for discharge. Final Clinical Impressions(s) / ED Diagnoses   Final diagnoses:  Orthostatic hypotension  Syncope, unspecified syncope type    New Prescriptions New Prescriptions   No medications on file     Blanchie Dessert, MD 04/09/16 1531

## 2016-04-09 NOTE — ED Notes (Signed)
Pt discharged home. Pt daughter at bedside. Pt in NAD.

## 2016-04-10 ENCOUNTER — Emergency Department (HOSPITAL_COMMUNITY)
Admission: EM | Admit: 2016-04-10 | Discharge: 2016-04-10 | Disposition: A | Discharge status: Home / Self Care | Payer: Medicare Other | Attending: Emergency Medicine | Admitting: Emergency Medicine

## 2016-04-10 ENCOUNTER — Encounter (HOSPITAL_COMMUNITY): Payer: Self-pay | Admitting: Emergency Medicine

## 2016-04-10 DIAGNOSIS — Z87891 Personal history of nicotine dependence: Secondary | ICD-10-CM | POA: Insufficient documentation

## 2016-04-10 DIAGNOSIS — R031 Nonspecific low blood-pressure reading: Secondary | ICD-10-CM | POA: Diagnosis not present

## 2016-04-10 DIAGNOSIS — E860 Dehydration: Secondary | ICD-10-CM | POA: Diagnosis not present

## 2016-04-10 DIAGNOSIS — Z7982 Long term (current) use of aspirin: Secondary | ICD-10-CM | POA: Insufficient documentation

## 2016-04-10 DIAGNOSIS — Z8507 Personal history of malignant neoplasm of pancreas: Secondary | ICD-10-CM | POA: Diagnosis not present

## 2016-04-10 DIAGNOSIS — I110 Hypertensive heart disease with heart failure: Secondary | ICD-10-CM | POA: Insufficient documentation

## 2016-04-10 DIAGNOSIS — R531 Weakness: Secondary | ICD-10-CM | POA: Insufficient documentation

## 2016-04-10 DIAGNOSIS — R55 Syncope and collapse: Secondary | ICD-10-CM | POA: Insufficient documentation

## 2016-04-10 DIAGNOSIS — I5032 Chronic diastolic (congestive) heart failure: Secondary | ICD-10-CM | POA: Diagnosis not present

## 2016-04-10 DIAGNOSIS — Z950 Presence of cardiac pacemaker: Secondary | ICD-10-CM | POA: Diagnosis not present

## 2016-04-10 DIAGNOSIS — Z8673 Personal history of transient ischemic attack (TIA), and cerebral infarction without residual deficits: Secondary | ICD-10-CM | POA: Insufficient documentation

## 2016-04-10 LAB — CBC WITH DIFFERENTIAL/PLATELET
BASOS ABS: 0 10*3/uL (ref 0.0–0.1)
Basophils Relative: 1 %
EOS PCT: 1 %
Eosinophils Absolute: 0.1 10*3/uL (ref 0.0–0.7)
HCT: 35.9 % — ABNORMAL LOW (ref 36.0–46.0)
HEMOGLOBIN: 11.8 g/dL — AB (ref 12.0–15.0)
Lymphocytes Relative: 17 %
Lymphs Abs: 0.7 10*3/uL (ref 0.7–4.0)
MCH: 28.1 pg (ref 26.0–34.0)
MCHC: 32.9 g/dL (ref 30.0–36.0)
MCV: 85.5 fL (ref 78.0–100.0)
Monocytes Absolute: 0.6 10*3/uL (ref 0.1–1.0)
Monocytes Relative: 14 %
NEUTROS ABS: 2.9 10*3/uL (ref 1.7–7.7)
NEUTROS PCT: 67 %
PLATELETS: 112 10*3/uL — AB (ref 150–400)
RBC: 4.2 MIL/uL (ref 3.87–5.11)
RDW: 15.2 % (ref 11.5–15.5)
WBC: 4.4 10*3/uL (ref 4.0–10.5)

## 2016-04-10 LAB — COMPREHENSIVE METABOLIC PANEL
ALK PHOS: 221 U/L — AB (ref 38–126)
ALT: 35 U/L (ref 14–54)
ANION GAP: 6 (ref 5–15)
AST: 47 U/L — ABNORMAL HIGH (ref 15–41)
Albumin: 3 g/dL — ABNORMAL LOW (ref 3.5–5.0)
BILIRUBIN TOTAL: 0.9 mg/dL (ref 0.3–1.2)
BUN: 15 mg/dL (ref 6–20)
CALCIUM: 8.4 mg/dL — AB (ref 8.9–10.3)
CO2: 26 mmol/L (ref 22–32)
Chloride: 102 mmol/L (ref 101–111)
Creatinine, Ser: 1.2 mg/dL — ABNORMAL HIGH (ref 0.44–1.00)
GFR calc non Af Amer: 41 mL/min — ABNORMAL LOW (ref >60)
GFR, EST AFRICAN AMERICAN: 47 mL/min — AB (ref >60)
Glucose, Bld: 119 mg/dL — ABNORMAL HIGH (ref 65–99)
POTASSIUM: 3.7 mmol/L (ref 3.5–5.1)
Sodium: 134 mmol/L — ABNORMAL LOW (ref 135–145)
TOTAL PROTEIN: 5.8 g/dL — AB (ref 6.5–8.1)

## 2016-04-10 LAB — URINALYSIS, ROUTINE W REFLEX MICROSCOPIC
Bilirubin Urine: NEGATIVE
Glucose, UA: NEGATIVE mg/dL
KETONES UR: NEGATIVE mg/dL
NITRITE: NEGATIVE
PH: 6 (ref 5.0–8.0)
PROTEIN: NEGATIVE mg/dL
Specific Gravity, Urine: 1.004 — ABNORMAL LOW (ref 1.005–1.030)

## 2016-04-10 LAB — I-STAT CHEM 8, ED
BUN: 16 mg/dL (ref 6–20)
CHLORIDE: 98 mmol/L — AB (ref 101–111)
Calcium, Ion: 1.12 mmol/L — ABNORMAL LOW (ref 1.15–1.40)
Creatinine, Ser: 1 mg/dL (ref 0.44–1.00)
GLUCOSE: 113 mg/dL — AB (ref 65–99)
HEMATOCRIT: 36 % (ref 36.0–46.0)
HEMOGLOBIN: 12.2 g/dL (ref 12.0–15.0)
POTASSIUM: 3.6 mmol/L (ref 3.5–5.1)
SODIUM: 136 mmol/L (ref 135–145)
TCO2: 25 mmol/L (ref 0–100)

## 2016-04-10 LAB — CBG MONITORING, ED: Glucose-Capillary: 123 mg/dL — ABNORMAL HIGH (ref 65–99)

## 2016-04-10 LAB — URINE MICROSCOPIC-ADD ON: RBC / HPF: NONE SEEN RBC/hpf (ref 0–5)

## 2016-04-10 LAB — I-STAT CG4 LACTIC ACID, ED: Lactic Acid, Venous: 1.22 mmol/L (ref 0.5–1.9)

## 2016-04-10 MED ORDER — SODIUM CHLORIDE 0.9 % IV BOLUS (SEPSIS)
500.0000 mL | Freq: Once | INTRAVENOUS | Status: AC
  Administered 2016-04-10: 500 mL via INTRAVENOUS

## 2016-04-10 NOTE — Discharge Instructions (Signed)
You must return immediately to the emergency department if new symptoms develop, he become worse or for any concerns at all.

## 2016-04-10 NOTE — Discharge Planning (Signed)
Pt currently active with [facility] for PT/OT services.  Resumption of care requested with addition of NA for pm services. Edwinna Areola of [facility] notified.  No DME needs identified at this time.

## 2016-04-10 NOTE — ED Triage Notes (Signed)
Pt had syncopal episode at home. Pt did not hit head. Pt sat on toilet no injury. Pt had two syncopal episodes with EMS. Pt seen here yesterday for low BP. Pt's BP [id number] systolic initially. EMS gave 537mL NS. Pt BP improved to 98/56. Pt alert and oriented.

## 2016-04-10 NOTE — ED Notes (Signed)
MD speaking with family

## 2016-04-10 NOTE — ED Provider Notes (Signed)
[facility] DEPT Provider Note  CSN: [id number] Arrival Date & Time: 04/10/16 @ 1100  History    Chief Complaint Chief Complaint  Patient presents with  . Loss of Consciousness    History of Present Illness  Patient Identification Ann Kaufman is a 82 y.o. female.  Patient information was obtained from patient and daughter and EMS. History/Exam limitations: none. Patient presented to the Emergency Department by ambulance where the patient received IVF due to hypotension prior to arrival.  Chief Complaint  Loss of Consciousness   Patient complains of dizziness, lightheadedness, near syncope and syncope. Onset was 3 weeks ago, with intermittent course since that time. Patient was moving from sitting to standing position and heading to the bathroom to relieve self at time of onset. The duration of the episode few seconds. Patient denies vertigo, Vomiting, Chest pain, Palpitations, Weakness, back pain, abdominal pain and seizure activity. Symptoms are exacerbated by standing. Symptoms are relieved by position and fluids. Associated symptoms include decreased appetite or thrist. The patient denies diaphoresis, fever, nasal congestion, headache, chest pain, palpitations, vertigo, dizziness, blurred vision, diplopia, loss of vision, slurred speech, focal weakness, focal sensory loss, clumsiness, nausea, vomiting, neck pain, incontinence, seizure activity.  However per the daughter and aid that events occurred with this AM, patient took her tramadol for aggressive pancreatic cancer which per the daughter and the aid cause the patient to be "goofy and uninhibited."      Past Medical & Surgical History    Past Medical History:  Diagnosis Date  . Aphasia    Occasional aphasia & facial numbness from a left frontal lobe intercerebral hemorrhage on Xarelto on 05/18/13.  . Arthritis   . Atrial fibrillation ([place]) 2012   a. Dx in 2012;  b. Rhythm controlled - amiodarone, previously  anticoagulated with xarelto (d/c'd 04/2013 in setting of [place]);  b. 04/2013 Echo: EF 55-60%, Gr 1 DD, mild MR, mod dil LA, PAsP 34mmhg  . Depression   . Headache   . Hyperlipidemia   . Hypertension   . Intracerebral hemorrhage ([place])    a. 04/2013 in setting of xarelto therapy.  . Mitral valve prolapse   . Pacemaker   . PAF (paroxysmal atrial fibrillation) ([place])   . Palpitations    Occasional.  . Pericardial effusion   . Peripheral edema    Because she has been standing on her feet more than usual   . Presence of permanent cardiac pacemaker    a. 2012 MDT, placed in [place] (probable tachy-brady).  . Rheumatoid arthritis ([place])   . SOB (shortness of breath)    Occasional SOB  . SSS (sick sinus syndrome) ([facility])    s/p dual chamber pacemaker   Patient Active Problem List   Diagnosis Date Noted  . Pancreatic cancer ([place]) 03/25/2016  . Osteoarthritis of right knee 03/19/2015  . Trochanteric bursitis of both hips 03/19/2015  . Gait disorder 03/19/2015  . Hyperlipidemia 12/08/2014  . Acute right-sided weakness 12/02/2014  . Pulmonary HTN 12/02/2014  . DOE (dyspnea on exertion) 12/02/2014  . Chronic diastolic heart failure, NYHA class 1 ([place]) 12/02/2014  . TIA (transient ischemic attack) 12/02/2014  . Cerebral infarction due to unspecified mechanism   . Myofascial muscle pain 03/20/2014  . Sick sinus syndrome ([place]) 10/19/2013  . Seizure disorder after stroke 07/24/2013  . Syncope 05/30/2013  . Pacemaker 05/30/2013  . Chronic anticoagulation 05/19/2013  . History of ICH (intracerebral hemorrhage) 05/18/2013  . Atrial fibrillation, chronic ([place]) 04/06/2013  . Atherosclerosis of  aorta ([place]) 04/06/2013  . Pleural effusion 11/27/2011  . HTN (hypertension) 11/27/2011   Past Surgical History:  Procedure Laterality Date  . ABDOMINAL HYSTERECTOMY    . APPENDECTOMY    . BOWEL RESECTION    . BREAST LUMPECTOMY    . LEAD REVISION  09/17/11   s/p atrial lead revision  . PACEMAKER  INSERTION     MDT implanted at [place] History    Family History  Problem Relation Age of Onset  . Stroke Father   . Aneurysm Mother   . Other      negative for premature CAD.  Marland Kitchen Cancer Sister 37    esophageal cancer   . Cancer Other     nephew had lung and kidney cancer    Social History  Substance Use Topics  . Smoking status: Former Smoker    Packs/day: 0.25    Years: 30.00    Quit date: 11/26/1968  . Smokeless tobacco: Never Used  . Alcohol use Yes     Comment: wine on occasionally    Home Medications    Prior to Admission medications   Medication Sig Start Date End Date Taking? Authorizing Provider  amiodarone (PACERONE) 200 MG tablet Take 0.5 tablets (100 mg total) by mouth every morning. 05/23/15   Meredith Staggers, MD  aspirin 325 MG EC tablet Take 325 mg by mouth daily.     Historical Provider, MD  diclofenac sodium (VOLTAREN) 1 % GEL Apply 1 application topically 3 (three) times daily as needed (pain).    Historical Provider, MD  dronabinol (MARINOL) 2.5 MG capsule Take 1 capsule (2.5 mg total) by mouth 2 (two) times daily before a meal. 04/07/16   Truitt Merle, MD  ergocalciferol (VITAMIN D2) 50000 units capsule TAKE 1 CAPSULE BY MOUTH EVERY 14 DAYS    Historical Provider, MD  famotidine (PEPCID) 20 MG tablet Take 1 tablet (20 mg total) by mouth 2 (two) times daily. 06/08/13   Bary Leriche, PA-C  folic acid (FOLVITE) A999333 MCG tablet Take 1 tablet (400 mcg total) by mouth every evening. 05/23/15   Meredith Staggers, MD  levETIRAcetam (KEPPRA) 500 MG tablet TAKE 1 TABLET TWICE A DAY 02/18/16   Meredith Staggers, MD  metoprolol tartrate (LOPRESSOR) 25 MG tablet Take 1 tablet (25 mg total) by mouth 2 (two) times daily. 05/23/15   Meredith Staggers, MD  senna (SENOKOT) 8.6 MG TABS tablet Take 2 tablets (17.2 mg total) by mouth at bedtime. 06/08/13   Ivan Anchors Love, PA-C  sertraline (ZOLOFT) 50 MG tablet TAKE 50 MG BY MOUTH AT BEDTIME 05/23/15   Meredith Staggers,  MD  simvastatin (ZOCOR) 10 MG tablet TAKE 10 MG BY MOUTH AT BEDTIME 05/23/15   Meredith Staggers, MD  traMADol (ULTRAM) 50 MG tablet Take 1 tablet (50 mg total) by mouth every 8 (eight) hours as needed. 03/25/16   Truitt Merle, MD  vitamin B-12 (CYANOCOBALAMIN) 1000 MCG tablet Take 1 tablet (1,000 mcg total) by mouth every evening. 05/23/15   Meredith Staggers, MD    Allergies    Review of patient's allergies indicates no known allergies.  I reviewed & agree with nursing's documentation on the patient's past medical, surgical, social & family histories as well as their allergies.  Review of Systems  Complete ROS obtained, and is negative except as stated in HPI.  Physical Exam  Updated Vital Signs BP 107/56 (BP Location: Right Arm)  Pulse 89   Temp 97.8 F (36.6 C) (Oral)   Resp 14   Ht 5\' 4"  (1.626 m)   Wt 58.5 kg   SpO2 97%   BMI 22.14 kg/m  I have reviewed the triage vital signs and the nursing notes. Physical Exam CONST: Patient alert, well appearing, in no apparent distress, oriented to person, place and time, dehydrated.  EYES: PERRLA. EOMI. Conjunctiva w/o d/c. Lids AT w/o swelling.  ENMT: External Nares & Ears AT w/o swelling. Oropharynx patent. MM dry.  NECK: ROM full w/o rigidity. Trachea midline. JVD absent.  CVS: +S1/S2 w/o obvious murmur. Lower extremities w/o pitting edema.  RESP: Respiratory effort unlabored w/o retractions & accessory muscle use. BS clear bilaterally.  GI: Soft & ND. +BS x 4. TTP absent. Hernia absent. Guarding & Rebound absent.  BACK: CVA TTP absent bilaterally.  SKIN: Skin warm & dry. Turgor poor. No rash.  PSYCH: Alert. Oriented. Affect and mood appropriate.  NEURO: CN II-XII grossly intact. Motor exam symmetric w/ upper & lower extremities 5/5 bilaterally. Sensation grossly intact.  MSK: Joints located & stable, w/o obvious dislocation & obvious deformity or crepitus absent w/ Cap refill < 2 sec. Peripheral pulses 2+ & equal in all extremities.    ED Treatments & Results   Labs (only abnormal results are displayed) Labs Reviewed  COMPREHENSIVE METABOLIC PANEL - Abnormal; Notable for the following:       Result Value   Sodium 134 (*)    Glucose, Bld 119 (*)    Creatinine, Ser 1.20 (*)    Calcium 8.4 (*)    Total Protein 5.8 (*)    Albumin 3.0 (*)    AST 47 (*)    Alkaline Phosphatase 221 (*)    GFR calc non Af Amer 41 (*)    GFR calc Af Amer 47 (*)    All other components within normal limits  CBC WITH DIFFERENTIAL/PLATELET - Abnormal; Notable for the following:    Hemoglobin 11.8 (*)    HCT 35.9 (*)    Platelets 112 (*)    All other components within normal limits  URINALYSIS, ROUTINE W REFLEX MICROSCOPIC (NOT AT [facility]) - Abnormal; Notable for the following:    Specific Gravity, Urine 1.004 (*)    Hgb urine dipstick MODERATE (*)    Leukocytes, UA TRACE (*)    All other components within normal limits  URINE MICROSCOPIC-ADD ON - Abnormal; Notable for the following:    Squamous Epithelial / LPF 0-5 (*)    Bacteria, UA FEW (*)    Crystals CA OXALATE CRYSTALS (*)    All other components within normal limits  CBG MONITORING, ED - Abnormal; Notable for the following:    Glucose-Capillary 123 (*)    All other components within normal limits  I-STAT CHEM 8, ED - Abnormal; Notable for the following:    Chloride 98 (*)    Glucose, Bld 113 (*)    Calcium, Ion 1.12 (*)    All other components within normal limits  URINE CULTURE  I-STAT CG4 LACTIC ACID, ED    EKG    EKG Interpretation  Date/Time:  Thursday April 10 2016 11:05:35 EDT Ventricular Rate:  63 PR Interval:    QRS Duration: 98 QT Interval:  491 QTC Calculation: 503 R Axis:   -55 Text Interpretation:  Atrial-paced rhythm Left anterior fascicular block Consider anterior infarct Repol abnrm suggests ischemia, lateral leads Prolonged QT interval No significant change since last tracing Confirmed by [facility]  MD, Loree Fee ([id number]) on 04/10/2016 11:11:09 AM  Also confirmed by Maryan Rued  MD, WHITNEY ([id number]), editor [place]  CCT, BEVERLY ([id number])  on 04/10/2016 11:52:04 AM       Radiology Dg Chest Port 1 View  Result Date: 04/09/2016 CLINICAL DATA:  82 year old female status post fall at home. Syncope. Head injury. Initial encounter. EXAM: PORTABLE CHEST 1 VIEW COMPARISON:  03/14/2015 and earlier. FINDINGS: Portable AP semi upright view at 1420 hours. Lower lung volumes. Allowing for portable technique the lungs are clear. No pneumothorax or pleural effusion. Stable left chest cardiac pacemaker. Normal cardiac size and mediastinal contours. Calcified aortic atherosclerosis. No acute osseous abnormality identified. IMPRESSION: No acute cardiopulmonary abnormality or acute traumatic injury identified. Electronically Signed   By: Genevie Ann M.D.   On: 04/09/2016 14:28    Pertinent labs & imaging results that were available during my care of the patient were independently visualized by me and considered in my medical decision making, please see chart for details. Formal interpretation provided by Radiology.  Procedures (including critical care time) Procedures  Medications Ordered in ED Medications  sodium chloride 0.9 % bolus 500 mL (0 mLs Intravenous Stopped 04/10/16 1253)    Initial Impression & Plan / ED Course & Results / Final Disposition   Initial Impression & Plan Patient presents via EMS to the ED for recurrent near vs syncope earlier today. With EMS "appeared to nod off" and hypotensive to MAPs upper 50s-lower 60s which improved w/ IVF however arrives MAPs of 62. Pre review of chart patient has had several visits recently for similar events during which no cause found and pacemaker interrogated and found to be normal.  Past medical history includes paroxysmal Atrial fibrillation, Depression, Intracerebral hemorrhage, Mitral valve prolapse, Rheumatoid arthritis and SSS (sick sinus syndrome).  New oxygen requirement absent. Core Temperature  reveals patient is afebrile. Patient appears alert. Orientation baseline. Blood glucose upon arrival WNL. Takes no oral Hypoglycemic medications. Not consistent w/ Hypoglycemic episode. Patient denies significant alcohol or illicit drug use. Does not appear intoxicated.  Patient is well appearing w/o evidence of toxicity or acute distress. I have considered and doubt Sepsis to be likely given patient does not meet SIRS criteria in setting of suspected infection.  Per H&P & the patient's vague symptoms, is most consistent w/ syncope. Return to baseline was sudden.  Upon LOC, fall not present and patient able to maintain tone therefore no traumatic concerns and obvious trauma absent.  I have considered and doubt Intracranial Bleed/Hematoma & Spinal Injury, therefore I did not obtain CT Head & CT Cervical Spine.  Acute or focal Neurologic deficit absent. Vision, strength and sensation endorsed to be baseline. Not consistent w/ Post-Ictal Period or Todd's Paralysis. Not consistent w/ vertigo. Cerebellar Exam reveals dysmetria & dysdiadochokinesia absent. I have considered and doubt Seizure or CVA/TIA. No headache w/ neck pain or meningeal signs. I have considered and doubt Meningitis/Encephalitis.  Ambulation Evaluation completed in the ED w/o assistance. Gait baseline. Able to bear weight & transfer weight appropriately  w/o concerns for ataxia, significant instability or discomfort, or near syncope.  I have considered and doubt Toxicologic or Environmental Exposure. Specifically not consistent w/ Carbon Monoxide Poisoning given time of year and of those exposed, only patient is symptomatic.  Severe or sudden headache that reached maximal intensity shortly after onset absent. History of know Intracranial Aneurysm absent. I have considered and doubt SAH.  Endorses no recent change or increase blood pressure treatment. Exam reveals no acute murmur or  evidence of Shock or Volume Overload. Pacer recently  interrogated, will not perform today.  I obtained and reviewed ECG which revealed atrial paced rhythm. Evidence of Acute STEMI, LVH, AV Block, & BBB absent. QT/QTc mildly prolonged therefore checked electrolytes which were WNL. Evidence of Brugada Syndrome, Long QT Syndrome, Preexcitation Syndrome, or Obstructive Structural Disease absent. I have considered and doubt Critical Aortic Stenosis, Tamponade, Unstable Arrhythmia/Cardiomyopathy & STEMI.  No tearing Back pain. Pulse deficit or Blood Pressure deficit absent. Abdomen benign. Pulsatile Abdominal mass & bruit absent. Bloody emesis & stools absent. Concerning degree of vaginal bleeding absent. Ecchymosis, petechiae & purpura absent. Clinical evidence of active bleeding absent. I have considered and doubt coagulopathy or symptomatic anemia, and Hgb stable. I have considered and doubt Aortic Aneurysm or Aortic Dissection.  No endorsed SOB, doubt PE.   Patient however obviously dehydrated. Mucous membranes are dry, but without pallor or cyanosis. Orthostatic Testing performed and positive. IVF provided and MAPs rechecked, improved, MAPs mid to upper 80s.  I reviewed labs and appreciate no acute metabolic, electrolyte or hematologic concern.  Since patient has close follow up with primary care team, daughter able to provide close care w/ home aid, and patient not agreeable to staying I consulted social work who helped me set up and order full time home care, therefore in light of the patient's reassuring evaluation above, I consider discharge disposition reasonable. They are in agreement. I gave my typical, strict return precautions in simple, non-technical language. We also discussed symptoms that are most concerning & would necessitate emergent return. I explicitly told them to immediately return to the ED if new symptoms develop, if worse, or for ANY concern. Treatments & follow up plan agreed upon & I confirmed all concerns & questions were addressed  prior to discharge.  Follow Up Velna Hatchet, MD [place] [address] [phone]  Schedule an appointment as soon as possible for a visit in 1 day For symptomatic reassessment  [facility] [address] [id number] [place] [phone] Go to  [place] IF WORSE   New Prescriptions Discharge Medication List as of 04/10/2016  4:03 PM       Final Clinical Impression & ED Diagnoses   1. Near syncope   2. Dehydration    Patient care discussed with the attending physician, Maryan Rued, who oversaw their evaluation & treatment & voiced agreement.  Note: This document was prepared using Dragon voice recognition software and may include unintentional dictation errors.  House Officer: Voncille Lo, MD, Emergency Medicine Resident.   Voncille Lo, MD 04/11/16 [id number]    Blanchie Dessert, MD 04/13/16 2044

## 2016-04-11 ENCOUNTER — Telehealth: Payer: Self-pay | Admitting: *Deleted

## 2016-04-11 LAB — URINE CULTURE

## 2016-04-11 NOTE — Telephone Encounter (Signed)
Received call from [facility] @ [place] re:   Pt's daughter had called Hospice about referral as mentioned by Dr. Burr Medico. Dewaine Oats wanted to know if Dr. Burr Medico would be the attending for hospice services. Y[facility]      Phone     [phone].

## 2016-04-13 ENCOUNTER — Encounter: Payer: Self-pay | Admitting: Neurology

## 2016-04-14 ENCOUNTER — Telehealth: Payer: Self-pay | Admitting: *Deleted

## 2016-04-14 ENCOUNTER — Encounter: Payer: Self-pay | Admitting: Hematology

## 2016-04-14 DIAGNOSIS — I951 Orthostatic hypotension: Secondary | ICD-10-CM | POA: Diagnosis not present

## 2016-04-14 DIAGNOSIS — F418 Other specified anxiety disorders: Secondary | ICD-10-CM | POA: Diagnosis not present

## 2016-04-14 DIAGNOSIS — Z6826 Body mass index (BMI) 26.0-26.9, adult: Secondary | ICD-10-CM | POA: Diagnosis not present

## 2016-04-14 DIAGNOSIS — I509 Heart failure, unspecified: Secondary | ICD-10-CM | POA: Diagnosis not present

## 2016-04-14 DIAGNOSIS — C253 Malignant neoplasm of pancreatic duct: Secondary | ICD-10-CM | POA: Diagnosis not present

## 2016-04-14 NOTE — Telephone Encounter (Signed)
Spoke with Elmo Putt @ [facility] to make referral for hospice.  Informed Elmo Putt that Dr. Burr Medico will be the hospice attending.  Asked Elmo Putt to activate standing orders for hospice as per protocol.  Elmo Putt stated she would relay message to nurse on call tonight. [facility]    Phone     [phone].

## 2016-04-15 DIAGNOSIS — I4891 Unspecified atrial fibrillation: Secondary | ICD-10-CM | POA: Diagnosis not present

## 2016-04-15 DIAGNOSIS — E785 Hyperlipidemia, unspecified: Secondary | ICD-10-CM | POA: Diagnosis not present

## 2016-04-15 DIAGNOSIS — R296 Repeated falls: Secondary | ICD-10-CM | POA: Diagnosis not present

## 2016-04-15 DIAGNOSIS — I110 Hypertensive heart disease with heart failure: Secondary | ICD-10-CM | POA: Diagnosis not present

## 2016-04-15 DIAGNOSIS — S93401D Sprain of unspecified ligament of right ankle, subsequent encounter: Secondary | ICD-10-CM | POA: Diagnosis not present

## 2016-04-15 DIAGNOSIS — I509 Heart failure, unspecified: Secondary | ICD-10-CM | POA: Diagnosis not present

## 2016-04-16 DIAGNOSIS — C7889 Secondary malignant neoplasm of other digestive organs: Secondary | ICD-10-CM | POA: Diagnosis not present

## 2016-04-16 DIAGNOSIS — R296 Repeated falls: Secondary | ICD-10-CM | POA: Diagnosis not present

## 2016-04-16 DIAGNOSIS — I5030 Unspecified diastolic (congestive) heart failure: Secondary | ICD-10-CM | POA: Diagnosis not present

## 2016-04-16 DIAGNOSIS — I951 Orthostatic hypotension: Secondary | ICD-10-CM | POA: Diagnosis not present

## 2016-04-16 DIAGNOSIS — C259 Malignant neoplasm of pancreas, unspecified: Secondary | ICD-10-CM | POA: Diagnosis not present

## 2016-04-16 DIAGNOSIS — I110 Hypertensive heart disease with heart failure: Secondary | ICD-10-CM | POA: Diagnosis not present

## 2016-04-16 DIAGNOSIS — R110 Nausea: Secondary | ICD-10-CM | POA: Diagnosis not present

## 2016-04-16 DIAGNOSIS — R251 Tremor, unspecified: Secondary | ICD-10-CM | POA: Diagnosis not present

## 2016-04-16 DIAGNOSIS — S93401D Sprain of unspecified ligament of right ankle, subsequent encounter: Secondary | ICD-10-CM | POA: Diagnosis not present

## 2016-04-16 DIAGNOSIS — I672 Cerebral atherosclerosis: Secondary | ICD-10-CM | POA: Diagnosis not present

## 2016-04-16 DIAGNOSIS — I10 Essential (primary) hypertension: Secondary | ICD-10-CM | POA: Diagnosis not present

## 2016-04-16 DIAGNOSIS — R630 Anorexia: Secondary | ICD-10-CM | POA: Diagnosis not present

## 2016-04-16 DIAGNOSIS — F339 Major depressive disorder, recurrent, unspecified: Secondary | ICD-10-CM | POA: Diagnosis not present

## 2016-04-16 DIAGNOSIS — I4891 Unspecified atrial fibrillation: Secondary | ICD-10-CM | POA: Diagnosis not present

## 2016-04-16 DIAGNOSIS — K219 Gastro-esophageal reflux disease without esophagitis: Secondary | ICD-10-CM | POA: Diagnosis not present

## 2016-04-16 DIAGNOSIS — R109 Unspecified abdominal pain: Secondary | ICD-10-CM | POA: Diagnosis not present

## 2016-04-16 DIAGNOSIS — E785 Hyperlipidemia, unspecified: Secondary | ICD-10-CM | POA: Diagnosis not present

## 2016-04-16 DIAGNOSIS — I509 Heart failure, unspecified: Secondary | ICD-10-CM | POA: Diagnosis not present

## 2016-04-17 DIAGNOSIS — R8299 Other abnormal findings in urine: Secondary | ICD-10-CM | POA: Diagnosis not present

## 2016-04-17 DIAGNOSIS — C259 Malignant neoplasm of pancreas, unspecified: Secondary | ICD-10-CM | POA: Diagnosis not present

## 2016-04-17 DIAGNOSIS — R300 Dysuria: Secondary | ICD-10-CM | POA: Diagnosis not present

## 2016-04-17 DIAGNOSIS — I951 Orthostatic hypotension: Secondary | ICD-10-CM | POA: Diagnosis not present

## 2016-04-17 DIAGNOSIS — R630 Anorexia: Secondary | ICD-10-CM | POA: Diagnosis not present

## 2016-04-17 DIAGNOSIS — R110 Nausea: Secondary | ICD-10-CM | POA: Diagnosis not present

## 2016-04-17 DIAGNOSIS — R109 Unspecified abdominal pain: Secondary | ICD-10-CM | POA: Diagnosis not present

## 2016-04-17 DIAGNOSIS — N390 Urinary tract infection, site not specified: Secondary | ICD-10-CM | POA: Diagnosis not present

## 2016-04-17 DIAGNOSIS — C7889 Secondary malignant neoplasm of other digestive organs: Secondary | ICD-10-CM | POA: Diagnosis not present

## 2016-04-22 DIAGNOSIS — R110 Nausea: Secondary | ICD-10-CM | POA: Diagnosis not present

## 2016-04-22 DIAGNOSIS — R630 Anorexia: Secondary | ICD-10-CM | POA: Diagnosis not present

## 2016-04-22 DIAGNOSIS — R109 Unspecified abdominal pain: Secondary | ICD-10-CM | POA: Diagnosis not present

## 2016-04-22 DIAGNOSIS — I951 Orthostatic hypotension: Secondary | ICD-10-CM | POA: Diagnosis not present

## 2016-04-22 DIAGNOSIS — C7889 Secondary malignant neoplasm of other digestive organs: Secondary | ICD-10-CM | POA: Diagnosis not present

## 2016-04-22 DIAGNOSIS — C259 Malignant neoplasm of pancreas, unspecified: Secondary | ICD-10-CM | POA: Diagnosis not present

## 2016-04-23 DIAGNOSIS — E785 Hyperlipidemia, unspecified: Secondary | ICD-10-CM | POA: Diagnosis not present

## 2016-04-23 DIAGNOSIS — I10 Essential (primary) hypertension: Secondary | ICD-10-CM | POA: Diagnosis not present

## 2016-04-23 DIAGNOSIS — I672 Cerebral atherosclerosis: Secondary | ICD-10-CM | POA: Diagnosis not present

## 2016-04-23 DIAGNOSIS — I4891 Unspecified atrial fibrillation: Secondary | ICD-10-CM | POA: Diagnosis not present

## 2016-04-23 DIAGNOSIS — K219 Gastro-esophageal reflux disease without esophagitis: Secondary | ICD-10-CM | POA: Diagnosis not present

## 2016-04-23 DIAGNOSIS — F339 Major depressive disorder, recurrent, unspecified: Secondary | ICD-10-CM | POA: Diagnosis not present

## 2016-04-23 DIAGNOSIS — C259 Malignant neoplasm of pancreas, unspecified: Secondary | ICD-10-CM | POA: Diagnosis not present

## 2016-04-23 DIAGNOSIS — R251 Tremor, unspecified: Secondary | ICD-10-CM | POA: Diagnosis not present

## 2016-04-23 DIAGNOSIS — I5030 Unspecified diastolic (congestive) heart failure: Secondary | ICD-10-CM | POA: Diagnosis not present

## 2016-04-23 DIAGNOSIS — C7889 Secondary malignant neoplasm of other digestive organs: Secondary | ICD-10-CM | POA: Diagnosis not present

## 2016-04-23 DIAGNOSIS — R110 Nausea: Secondary | ICD-10-CM | POA: Diagnosis not present

## 2016-04-23 DIAGNOSIS — R630 Anorexia: Secondary | ICD-10-CM | POA: Diagnosis not present

## 2016-04-23 DIAGNOSIS — R109 Unspecified abdominal pain: Secondary | ICD-10-CM | POA: Diagnosis not present

## 2016-04-23 DIAGNOSIS — I951 Orthostatic hypotension: Secondary | ICD-10-CM | POA: Diagnosis not present

## 2016-04-28 ENCOUNTER — Ambulatory Visit: Payer: Medicare Other | Admitting: Hematology

## 2016-04-30 ENCOUNTER — Encounter: Payer: Self-pay | Admitting: Hematology

## 2016-04-30 ENCOUNTER — Ambulatory Visit (HOSPITAL_BASED_OUTPATIENT_CLINIC_OR_DEPARTMENT_OTHER): Payer: Medicare Other | Admitting: Hematology

## 2016-04-30 VITALS — BP 133/63 | HR 71 | Resp 18 | Ht 64.0 in | Wt 141.7 lb

## 2016-04-30 DIAGNOSIS — R630 Anorexia: Secondary | ICD-10-CM | POA: Diagnosis not present

## 2016-04-30 DIAGNOSIS — C251 Malignant neoplasm of body of pancreas: Secondary | ICD-10-CM

## 2016-04-30 DIAGNOSIS — R1013 Epigastric pain: Secondary | ICD-10-CM

## 2016-04-30 DIAGNOSIS — J90 Pleural effusion, not elsewhere classified: Secondary | ICD-10-CM

## 2016-04-30 DIAGNOSIS — R110 Nausea: Secondary | ICD-10-CM | POA: Diagnosis not present

## 2016-04-30 DIAGNOSIS — C7989 Secondary malignant neoplasm of other specified sites: Secondary | ICD-10-CM

## 2016-04-30 DIAGNOSIS — I482 Chronic atrial fibrillation, unspecified: Secondary | ICD-10-CM

## 2016-04-30 DIAGNOSIS — I10 Essential (primary) hypertension: Secondary | ICD-10-CM

## 2016-04-30 MED ORDER — ALPRAZOLAM 0.25 MG PO TABS: 0.2500 mg | ORAL_TABLET | Freq: Two times a day (BID) | ORAL | 0 refills | Status: DC | PRN

## 2016-04-30 NOTE — Progress Notes (Signed)
[facility]  [phone] [phone]  Clinic Follow up Note   Patient Care Team: Velna Hatchet, MD as PCP - General (Internal Medicine) Isaias Cowman, MD as Consulting Physician (Internal Medicine) 04/30/2016  CHIEF COMPLAINTS:  Follow up pancreatic cancer   HISTORY OF PRESENTING ILLNESS:  Ann Kaufman 82 y.o. female with past medical history of atrial fibrillation, hypertension, CHF diastolic, hemorrhagic CVA in 2014, and mild dementia is here because of her recent abnormal CT findings. She is accompanied by her daughter to my clinic today. She was referred by her primary care physician.  She had sprained ankle about 2-3 weeks ago, which was managed conservatively. During her physical therapy about 2 weeks ago, she started having dizziness when she stands up, and was found to be orthostatic hypotensive. She also reports intermittent abdominal pain, nausea, worsening fatigue, decreased appetite, and 10-15 lbs weight loss in the past few months. She was seen by her primary care physician Dr. Ardeth Perfect, and underwent CT abdomen and pelvis on 03/20/2016. The scan showed a 3.2 cm mass in the pancreatitis, with direct invasion into the spleen, measuring 4.8 cm. She was referred to [place] for further evaluation.  CURRENT THERAPY: hospice care   INTERIM HISTORY: Kim returns for follow-up. She is accompanied by her daughter to the clinic today. She has enrolled into hospice a few weeks ago, she and her daughter are moving to a new house to live together in a few weeks. She is clinically stable, has intermittent epigastric pain, for which she takes Tylenol 2 tablets 2-4 times a day. She did not tolerate tramadol well, which made her dizzy and lethargic. Appetite is moderate to low, she is on Marinol twice daily, no signal nausea or other complaints. She is able to move around with a walker at home, no reasonable for him a no dizziness. Her right ankle sprain has  recovered well.  MEDICAL HISTORY:  Past Medical History:  Diagnosis Date  . Aphasia    Occasional aphasia & facial numbness from a left frontal lobe intercerebral hemorrhage on Xarelto on 05/18/13.  . Arthritis   . Atrial fibrillation ([place]) 2012   a. Dx in 2012;  b. Rhythm controlled - amiodarone, previously anticoagulated with xarelto (d/c'd 04/2013 in setting of [place]);  b. 04/2013 Echo: EF 55-60%, Gr 1 DD, mild MR, mod dil LA, PAsP 82mmhg  . Depression   . Headache   . Hyperlipidemia   . Hypertension   . Intracerebral hemorrhage ([place])    a. 04/2013 in setting of xarelto therapy.  . Mitral valve prolapse   . Pacemaker   . PAF (paroxysmal atrial fibrillation) ([place])   . Palpitations    Occasional.  . Pericardial effusion   . Peripheral edema    Because she has been standing on her feet more than usual   . Presence of permanent cardiac pacemaker    a. 2012 MDT, placed in [place] (probable tachy-brady).  . Rheumatoid arthritis ([place])   . SOB (shortness of breath)    Occasional SOB  . SSS (sick sinus syndrome) ([facility])    s/p dual chamber pacemaker    SURGICAL HISTORY: Past Surgical History:  Procedure Laterality Date  . ABDOMINAL HYSTERECTOMY    . APPENDECTOMY    . BOWEL RESECTION    . BREAST LUMPECTOMY    . LEAD REVISION  09/17/11   s/p atrial lead revision  . PACEMAKER INSERTION     MDT implanted at [place]:  Social History   Social History  . Marital status: Widowed    Spouse name: Eddie Dibbles  . Number of children: 3  . Years of education: 12   Occupational History  . retired    Social History Main Topics  . Smoking status: Former Smoker    Packs/day: 0.25    Years: 30.00    Quit date: 11/26/1968  . Smokeless tobacco: Never Used  . Alcohol use Yes     Comment: wine on occasionally  . Drug use:     Types: Other-see comments  . Sexual activity: Not on file   Other Topics Concern  . Not on file   Social History Narrative   Lives in [place]  by herself.  Daughter lives in [place].    FAMILY HISTORY: Family History  Problem Relation Age of Onset  . Stroke Father   . Aneurysm Mother   . Other      negative for premature CAD.  Marland Kitchen Cancer Sister 70    esophageal cancer   . Cancer Other     nephew had lung and kidney cancer     ALLERGIES:  has No Known Allergies.  MEDICATIONS:  Current Outpatient Prescriptions  Medication Sig Dispense Refill  . amiodarone (PACERONE) 200 MG tablet Take 0.5 tablets (100 mg total) by mouth every morning.    Marland Kitchen aspirin 325 MG EC tablet Take 325 mg by mouth daily.     . diclofenac sodium (VOLTAREN) 1 % GEL Apply 1 application topically 3 (three) times daily as needed (pain).    Marland Kitchen dronabinol (MARINOL) 2.5 MG capsule Take 1 capsule (2.5 mg total) by mouth 2 (two) times daily before a meal. 60 capsule 1  . ergocalciferol (VITAMIN D2) 50000 units capsule TAKE 1 CAPSULE BY MOUTH EVERY 14 DAYS    . famotidine (PEPCID) 20 MG tablet Take 1 tablet (20 mg total) by mouth 2 (two) times daily.    . folic acid (FOLVITE) A999333 MCG tablet Take 1 tablet (400 mcg total) by mouth every evening. 30 tablet 4  . levETIRAcetam (KEPPRA) 500 MG tablet TAKE 1 TABLET TWICE A DAY 180 tablet 0  . metoprolol tartrate (LOPRESSOR) 25 MG tablet Take 1 tablet (25 mg total) by mouth 2 (two) times daily. (Patient taking differently: Take 12.5 mg by mouth 2 (two) times daily. ) 60 tablet 1  . senna (SENOKOT) 8.6 MG TABS tablet Take 2 tablets (17.2 mg total) by mouth at bedtime. 120 each 0  . sertraline (ZOLOFT) 50 MG tablet TAKE 50 MG BY MOUTH AT BEDTIME 90 tablet 3  . simvastatin (ZOCOR) 10 MG tablet TAKE 10 MG BY MOUTH AT BEDTIME 90 tablet 3  . vitamin B-12 (CYANOCOBALAMIN) 1000 MCG tablet Take 1 tablet (1,000 mcg total) by mouth every evening. 30 tablet 1  . ALPRAZolam (XANAX) 0.25 MG tablet Take 1 tablet (0.25 mg total) by mouth 2 (two) times daily as needed for anxiety or sleep. 30 tablet 0  . traMADol (ULTRAM) 50 MG tablet Take 1  tablet (50 mg total) by mouth every 8 (eight) hours as needed. (Patient not taking: Reported on 04/30/2016) 30 tablet 0   No current facility-administered medications for this visit.     REVIEW OF SYSTEMS:   Constitutional: Denies fevers, chills or abnormal night sweats Eyes: Denies blurriness of vision, double vision or watery eyes Ears, nose, mouth, throat, and face: Denies mucositis or sore throat Respiratory: Denies cough, dyspnea or wheezes Cardiovascular: Denies palpitation, chest discomfort or lower extremity  swelling Gastrointestinal:  Denies nausea, heartburn or change in bowel habits Skin: Denies abnormal skin rashes Lymphatics: Denies new lymphadenopathy or easy bruising Neurological:Denies numbness, tingling or new weaknesses Behavioral/Psych: Mood is stable, no new changes  All other systems were reviewed with the patient and are negative.  PHYSICAL EXAMINATION: ECOG PERFORMANCE STATUS: 3  Vitals:   04/30/16 1246  BP: 133/63  Pulse: 71  Resp: 18   Filed Weights   04/30/16 1246  Weight: 141 lb 11.2 oz (64.3 kg)    GENERAL:alert, no distress and comfortable SKIN: skin color, texture, turgor are normal, no rashes or significant lesions EYES: normal, conjunctiva are pink and non-injected, sclera clear OROPHARYNX:no exudate, no erythema and lips, buccal mucosa, and tongue normal  NECK: supple, thyroid normal size, non-tender, without nodularity LYMPH:  no palpable lymphadenopathy in the cervical, axillary or inguinal LUNGS: clear to auscultation and percussion with normal breathing effort HEART: regular rate & rhythm and no murmurs and no lower extremity edema ABDOMEN:abdomen soft, Mild tenderness in the epigastric area, no organomegaly, normal bowel sounds Musculoskeletal:no cyanosis of digits and no clubbing  PSYCH: alert & oriented x 3 with fluent speech NEURO: no focal motor/sensory deficits  LABORATORY DATA:  I have reviewed the data as listed CBC Latest Ref  Rng & Units 04/10/2016 04/10/2016 04/09/2016  WBC 4.0 - 10.5 K/uL - 4.4 -  Hemoglobin 12.0 - 15.0 g/dL 12.2 11.8(L) 13.6  Hematocrit 36.0 - 46.0 % 36.0 35.9(L) 40.0  Platelets 150 - 400 K/uL - 112(L) -   CMP Latest Ref Rng & Units 04/10/2016 04/10/2016 04/09/2016  Glucose 65 - 99 mg/dL 113(H) 119(H) 82  BUN 6 - 20 mg/dL 16 15 17   Creatinine 0.44 - 1.00 mg/dL 1.00 1.20(H) 1.20(H)  Sodium 135 - 145 mmol/L 136 134(L) 135  Potassium 3.5 - 5.1 mmol/L 3.6 3.7 3.7  Chloride 101 - 111 mmol/L 98(L) 102 96(L)  CO2 22 - 32 mmol/L - 26 -  Calcium 8.9 - 10.3 mg/dL - 8.4(L) -  Total Protein 6.5 - 8.1 g/dL - 5.8(L) -  Total Bilirubin 0.3 - 1.2 mg/dL - 0.9 -  Alkaline Phos 38 - 126 U/L - 221(H) -  AST 15 - 41 U/L - 47(H) -  ALT 14 - 54 U/L - 35 -   Results for RHYLA, HEELAN (MRN [id number]) as of 04/08/2016 23:21  Ref. Range 03/25/2016 12:22 03/25/2016 12:22  CA 19-9 Latest Ref Range: 0 - 35 U/mL 25,773 (H)   CEA ([facility]) Latest Ref Range: 0.00 - 5.00 ng/mL  8.19 (H)     RADIOGRAPHIC STUDIES: I have personally reviewed the radiological images as listed and agreed with the findings in the report. Nm Pet Image Initial (pi) Skull Base To Thigh  Result Date: 04/03/2016 CLINICAL DATA:  Initial treatment strategy for pancreatic cancer. EXAM: NUCLEAR MEDICINE PET SKULL BASE TO THIGH TECHNIQUE: 7.7 mCi F-18 FDG was injected intravenously. Full-ring PET imaging was performed from the skull base to thigh after the radiotracer. CT data was obtained and used for attenuation correction and anatomic localization. FASTING BLOOD GLUCOSE:  Value: 125 mg/dl COMPARISON:  None. FINDINGS: NECK No hypermetabolic lymph nodes in the neck. CHEST No hypermetabolic mediastinal or hilar nodes. No suspicious pulmonary nodules on the CT scan. Left-sided permanent pacemaker noted. Atherosclerotic calcifications seen in the wall of the thoracic aorta. Lung windows reveal a tubular branching opacity in the posterior right  upper lobe (image 23 series 8) measuring roughly 15 x 15 mm. Given the  apparent brain seen configuration, this is likely related to airway impaction. Changes of emphysema noted. Otherwise no suspicious pulmonary nodule or mass. ABDOMEN/PELVIS Hypermetabolism is identified in the region of the pancreatic head/ porta hepatis with SUV max = 6.7. Previously described hypo attenuating pancreatic tail lesion is also hypermetabolic with SUV max = 8.6. Pancreatic tail lesion is contiguous with the low-attenuation splenic lesion measured 4.8 cm on the previous diagnostic CT scan. This splenic lesion is hypermetabolic with SUV max = 15. 8 mm short axis retroperitoneal lymph node, just posterior and superior to the pancreas is hypermetabolic with SUV max = 4.5. There is a amorphous soft tissue associated with an apparently encasing the celiac axis (image 97 series 4) which demonstrates hypermetabolism and SUV max = 5.2. 10 mm low left omental nodule (image 144 series 4) is hypermetabolic with SUV max = 4.2. 7 mm short axis left groin lymph node shows low level FDG uptake with SUV max = 2.6 and may be reactive. Gallbladder is distended. Abdominal aortic atherosclerosis noted without aneurysm. SKELETON No focal hypermetabolic activity to suggest skeletal metastasis. IMPRESSION: 1. Hypermetabolic hypo attenuating mass in the tail the pancreas with contiguous associated hypermetabolic lesion involving the spleen. 2. Focal hypermetabolism identified in the region of the pancreatic head/hepatoduodenal ligament. 3. Hypermetabolic amorphous soft tissue encasing the celiac axis. 4. Hypermetabolic low left omental nodule associated with a hypermetabolic left periaortic abdominal lymph node. 5. Right upper lobe nodular opacity has a branching configuration. This shows very low level FDG uptake and suggests airway impaction. Attention on follow-up recommended. Electronically Signed   By: Misty Stanley M.D.   On: 04/03/2016 14:13   Dg  Chest Port 1 View  Result Date: 04/09/2016 CLINICAL DATA:  82 year old female status post fall at home. Syncope. Head injury. Initial encounter. EXAM: PORTABLE CHEST 1 VIEW COMPARISON:  03/14/2015 and earlier. FINDINGS: Portable AP semi upright view at 1420 hours. Lower lung volumes. Allowing for portable technique the lungs are clear. No pneumothorax or pleural effusion. Stable left chest cardiac pacemaker. Normal cardiac size and mediastinal contours. Calcified aortic atherosclerosis. No acute osseous abnormality identified. IMPRESSION: No acute cardiopulmonary abnormality or acute traumatic injury identified. Electronically Signed   By: Genevie Ann M.D.   On: 04/09/2016 14:28    ASSESSMENT & PLAN:  82 year old Caucasian female, with multiple comorbidities, presents with worsening abdominal pain, nausea, fatigue, anorexia, and weight loss, and orthostatic hypotension.  1. Pancreatic tail mass, with direct invasion to spleen, likely pancreatic cancer, with node and possible peritoneal metastasis -I reviewed her CT scan images with patient and her daughter in great detail. The pancreatic to mass is highly suspicious for malignancy, especially adenocarcinoma. It appears has directly invading the spleen, possible surrounding adenopathy. -I reviewed her PET scan findings which showed hypermetabolic pancreatic tail mass with spleen invasion, hypermetabolic soft tissue versus lymph node in the pancreatic head and and hepatoduodenal ligament, and a possible peritoneum or lymph node metastasis -Her CA 19.9 is significantly elevated, with the above scan findings, this is most consistent with pancreatic adenocarcinoma -We discussed the options of biopsy. Unfortunately percutaneous biopsy may not be feasible, endoscopy with EUS biopsy is needed to get tissue diagnosis, which may require general anesthesia. Given her advanced age, comorbidities, and really no treatment options even after diagnosis, I do not strongly  feel she needs the tissue biopsy to confirm the diagnosis. Patient and her daughter agrees  -The patient is not a candidate for chemotherapy. We discussed the goal of therapy is  palliative, to preserve or improve her quality of life  -She is under hospice care, will continue. -We discussed her symptom management. -We again discussed possible worsening symptoms related to her cancer progression, such as pain, anorexia, nausea, and management strategy. We again discussed the option of management her symptoms and dehydration at home by hospice, or come to our clinic for iv fluids (I discouraged that, but her daughter wants her to get IVF if needed), instead of going to emergency room or hospital.  2. Nausea, anorexia, and epigastric Discomfort  -We discussed different options for appetite stimulator. Given her multiple GI symptoms, especially her concern of low appetite, I recommend her to try Marinol 2.5 mg twice daily, she is tolerating well, for nausea and appetite has improved, may consider increasing her dose if needed. -She did not tolerate tramadol well, due to dizziness and sedation. I recommend her to try half pill, and start at bedtime, as needed for pain. -She will continue nutrition supplement, her daughter is interested in nutrition consult, I'll ask our dietitian to give her a call.  2. Atrial fibrillation, hypertension, diastolic CHF, please and orthostatic hypotension -She will continue follow-up with her primary care physician -She is not on anticoagulation due to her prior history of hemorrhagic stroke when she was on Xarelto  -Her medication has been adjusted due to her orthostatic hypertension. I encouraged her to drink more fluids.  3. History of TIA and hemorrhagic stroke  4. Moderate dementia   5. Goal of care and code status  -The goal of care is palliative, preserve record of life, her daughter agrees -She is DO NOT RESUSCITATE and DO NOT INTUBATE  Plan -Continue  hospice care -She will continue Marinol for nausea and anorexia, may increase her dose if needed -She will try tramadol half tablet for her pain as needed, -I'll see her as needed in the future  All questions were answered. The patient knows to call the clinic with any problems, questions or concerns. I spent 20 minutes counseling the patient face to face. The total time spent in the appointment was 25 minutes and more than 50% was on counseling.     Truitt Merle, MD 04/30/2016

## 2016-05-01 ENCOUNTER — Ambulatory Visit (INDEPENDENT_AMBULATORY_CARE_PROVIDER_SITE_OTHER): Admitting: Neurology

## 2016-05-01 ENCOUNTER — Encounter: Payer: Self-pay | Admitting: Neurology

## 2016-05-01 VITALS — BP 130/72 | HR 68 | Ht 64.0 in | Wt 141.0 lb

## 2016-05-01 DIAGNOSIS — R569 Unspecified convulsions: Secondary | ICD-10-CM

## 2016-05-01 NOTE — Progress Notes (Signed)
PATIENT: Ann Kaufman DOB: Feb 01, 1930  REASON FOR VISIT: routine follow up for ICH HISTORY FROM: patient  HISTORY OF PRESENT ILLNESS: [name] is an 82 y.o. female with a history of a ICH on 05/18/2013 while on Xarelto for atrial fibrillation. Lives independently @ baseline. Brought by EMS to [facility] ED as code stroke and seizure. Intubated due to AMS with compromised airway and resp distress. CT head revealed Left frontal lobe intraparenchymal hematoma with intraventricular hemorrhage within the left lateral ventricle as above. Hemorrhagic infarct with intraventricular extension is suspected. No midline shift or hydrocephalus identified. Carotid Duplex Findings suggested 1-39% internal carotid artery stenosis bilaterally. She was with her daughter on 07/24/12 and while the two were walking her daughter noted neurological changes. Patient has a mild aphasia at baseline. Acutely though the patient became unable to speak and was having extensive difficulty getting her words out. Was also noted to have right upper extremity weakness as well. Patient remained ambulatory. Was brought to [facility] where a code stroke was called. Daughter reports that the patient's symptoms lasted about an hour and then resolved completely.  Today is her first visit to the office post-hospital. She is feeling well, back to living independently, with no residual deficits other than mild aphasia, which she had at baseline. BP is well controlled, it is 126/76 in office today. She is tolerating daily aspirin well without side effects, and is tolerating Keppra BID without complication. She has had no recurrent seizures. She has had a couple instances with irregularity in her peripheral vision, which resolved within moments. She has an eye evaluation appointment in 2 weeks.   UPDATE 12/12/13 (LL): Since last visit, patient is doing well.  Her eye evaluation was normal after last visit. She is tolerating daily aspirin well without side  effects, and is tolerating Keppra BID without complication. She has had no recurrent seizures.   Blood pressure is well controlled, it is 127/67 in the office today. Update 04/06/2014 : She returns for followup her last visit 4 months ago. She is a complaint by her daughter. She has new complaint of intermittent headaches. She is a poor historian and the daughter has to fill in most of the details. She has been having intermittent headaches for the last 3 months. These occurred a variable frequency but perhaps at least 2 or 3 times per week. The headaches are mostly in the occipital region but bifrontal also occasionally. She describes this as moderate to severe at 9/10 in severity. Mostly aching in nature but occasionally throbbing. There is not associated nausea ,vomiting  But there is slight light and sound sensitivity and she feels better if she turns down the music and the like. She cannot tell me his physical activity has any relationship with the headache. She does have remote history of migraine headaches in the 1970s but she had outgrown them. She denies any recent fall with head injury though she didn't fall and bruised her leg a month ago. She continues to take Keppra and has not had any breakthrough seizures. Her blood pressure is under good control and is 123/6 for an office today she denies any blurred vision, loss of vision, scalp tenderness, jaw claudication or myalgias or arthralgias. Update 07/13/2014 : She returns for follow-up after last visit 3 months ago accompanied by sister and daughter. She states her headaches are significantly better and she rarely has them and takes Tylenol which seems to work. She has not had any further seizures.  She remains on Keppra as she is tolerating well without any side effects. Had a CT scan of the head on 04/11/14 which I have personally reviewed and does not show any acute abnormalities. Remote age encephalomalacia and gliosis from prior intracerebral  hemorrhage is noted in the left frontal region as well as mild changes of small vessel disease and generalized cerebral atrophy. Lab results from 04/06/14 show normal metabolic panel lab , ESR except marginally elevated creatinine of 1.67m% Update 10/31/2014 : She returns for f/u after last visit 4 months ago. She still has mild left leg weakness especially when she first gets up. Her balance remains poor but she ha snot had any falls.She also has occasionally word finding difficulties and mild memory difficulties. She ha snot had any seizures and remains on keppra which she is tolerating well without any side effects.Her blood pressure is well controlled and is 128/77 today. Update 05/03/2015 : She returns for follow-up after last visit 6 months ago. [facility] for transient episode of unresponsiveness and presyncopal episode which was followed by transient right facial droop and slurred speech. MRI could not be performed due to pacemaker and CT scan of the head done twice showed only chronic left frontal encephalomalacia without acute abnormality. Transthoracic echo showed normal ejection fraction with grade 1 diastolic dysfunction and mild to moderate left atrial enlargement. Carotid ultrasound showed no significant extracranial stenosis. Hemoglobin A1c was 6.2. LDL cholesterol was 85 mg percent with triglycerides 161 and total cholesterol 176 mg percent. She has noticed since then that she gets lightheaded and dizzy particularly when she gets up suddenly. She is tolerating aspirin well but does get bruising. In fact she fell in the bath tub 3 weeks ago and has a bruise on her left shin. She uses a cane while walking mostly. She's not had any seizures for more than 2 years and remains on Keppra 500 mg twice daily which is tolerating well without side effects. Update 05/01/2016 ; She returns for follow-up after last visit a year ago. She is accompanied by her  daughter. Unfortunately she has been diagnosed with advanced pancreatic cancer which is inoperable and family has made decision for hospice care only. Patient and daughter want to reduce her medications to the bed necessary and to target only her comfort. They are here today to discuss the need for seizure medication and aspirin. Patient has gotten weak all over with reduced stamina. She is able to walk with a walker at home but gets tired after walking for 5-10 minutes. She is using a wheelchair for long distances. She has not had any recurrent TIA stroke or seizures not nearly 2 years. She is on aspirin for stroke prevention and on Keppra 500 mg  twice daily for seizures. REVIEW OF SYSTEMS: Full 14 system review of systems performed and notable only for:   Activity change, appetite change, fatigue, weight change, runny nose, and drooling, shortness of breath, murmur, nausea, swollen abdomen, frequent waking, daytime sleepiness, memory loss, dizziness, headache, numbness, weakness, tremors, agitation, and behavioral problem and all other systems negative and all other systems negative    ALLERGIES: No Known Allergies  HOME MEDICATIONS: Outpatient Medications Prior to Visit  Medication Sig Dispense Refill  . ALPRAZolam (XANAX) 0.25 MG tablet Take 1 tablet (0.25 mg total) by mouth 2 (two) times daily as needed for anxiety or sleep. 30 tablet 0  . amiodarone (PACERONE) 200 MG tablet Take 0.5 tablets (100  mg total) by mouth every morning.    Marland Kitchen aspirin 325 MG EC tablet Take 325 mg by mouth daily.     . diclofenac sodium (VOLTAREN) 1 % GEL Apply 1 application topically 3 (three) times daily as needed (pain).    Marland Kitchen dronabinol (MARINOL) 2.5 MG capsule Take 1 capsule (2.5 mg total) by mouth 2 (two) times daily before a meal. 60 capsule 1  . ergocalciferol (VITAMIN D2) 50000 units capsule TAKE 1 CAPSULE BY MOUTH EVERY 14 DAYS    . famotidine (PEPCID) 20 MG tablet Take 1 tablet (20 mg total) by mouth 2 (two)  times daily.    . folic acid (FOLVITE) 732 MCG tablet Take 1 tablet (400 mcg total) by mouth every evening. 30 tablet 4  . levETIRAcetam (KEPPRA) 500 MG tablet TAKE 1 TABLET TWICE A DAY 180 tablet 0  . metoprolol tartrate (LOPRESSOR) 25 MG tablet Take 1 tablet (25 mg total) by mouth 2 (two) times daily. (Patient taking differently: Take 12.5 mg by mouth 2 (two) times daily. ) 60 tablet 1  . senna (SENOKOT) 8.6 MG TABS tablet Take 2 tablets (17.2 mg total) by mouth at bedtime. 120 each 0  . sertraline (ZOLOFT) 50 MG tablet TAKE 50 MG BY MOUTH AT BEDTIME 90 tablet 3  . simvastatin (ZOCOR) 10 MG tablet TAKE 10 MG BY MOUTH AT BEDTIME 90 tablet 3  . traMADol (ULTRAM) 50 MG tablet Take 1 tablet (50 mg total) by mouth every 8 (eight) hours as needed. 30 tablet 0  . vitamin B-12 (CYANOCOBALAMIN) 1000 MCG tablet Take 1 tablet (1,000 mcg total) by mouth every evening. 30 tablet 1   No facility-administered medications prior to visit.      PHYSICAL EXAM  Vitals:   05/01/16 1052  BP: 130/72  BP Location: Right Arm  Patient Position: Sitting  Cuff Size: Normal  Pulse: 68  Weight: 141 lb (64 kg)  Height: _0  (1.626 m)   Body mass index is 24.2 kg/m. No exam data present  Generalized: Pleasant elderly Caucasian lady, in no acute distress  Head: normocephalic and atraumatic.    Neck: Supple, no carotid bruits  Cardiac: Regular rate rhythm, no murmur  Musculoskeletal: No deformity   Neurological examination  Mentation: Alert oriented to time, place, history taking. Follows all commands speech and language with mild expressive aphasia and word-finding difficulties.  Cranial nerve II-XII: Pupils were equal round reactive to light extraocular movements were full, visual field were full on confrontational test. Facial sensation and strength were normal. hearing was intact to finger rubbing bilaterally. Uvula tongue midline. head turning and shoulder shrug and were normal and symmetric.Tongue  protrusion into cheek strength was normal.  Motor: The motor testing reveals 5 over 5 strength of all 4 extremities. Good symmetric motor tone is noted throughout. Diminished fine finger movements on right and orbits left over right upper extremity Sensory: Sensory testing is intact to pinprick, soft touch, vibration sensation, and position sense on all 4 extremities. No evidence of extinction is noted.  Coordination: Cerebellar testing reveals good finger-nose-finger and heel-to-shin bilaterally.  Gait and station: Deferred as patient sitting in wheelchair  Reflexes: Deep tendon reflexes are symmetric and normal bilaterally. Toes are downgoing bilaterally.   ASSESSMENT AND PLAN Ms. ESHAAL DUBY is a 82 y.o. female presented with a left frontal intracranial hemorrhage on 05/18/13. The patient was on Xarelto prior to admission. Seizure at the time of presentation. She later presented with aphasia and RUE hemiparesis that  resolved on 07/24/12.  Recent episode of TIA in June 2016 in the setting of a presyncopal event. Recent diagnosis of advanced pancreatic cancer and decision to do hospice care only  PLAN:  I had a long discussion with the patient and her daughter regarding her unfortunate diagnosis of pancreatic cancer and decision for hospice care. They're interested in reducing and stopping her medications. I recommend reducing the dose of Keppra 500 mg  to half a tablet in the morning and 1 tablet at night for 2 weeks and then half a tablet twice daily but not stopping it completely because of seizure risk. She will continue on aspirin for now but if she has nausea or vomiting may consider stopping it. Greater than 50% time during this 25 minute visit was spent on counseling and coordination of care up for seizures and stroke She will return for follow-up in the future only as necessary Antony Contras, MD  05/01/2016, 11:32 AM [facility] [address] [place], [place]  [address] [phone]  Note: This document was prepared with digital dictation and possible smart phrase technology. Any transcriptional errors that result from this process are unintentional.

## 2016-05-05 DIAGNOSIS — I10 Essential (primary) hypertension: Secondary | ICD-10-CM | POA: Diagnosis not present

## 2016-05-05 DIAGNOSIS — C253 Malignant neoplasm of pancreatic duct: Secondary | ICD-10-CM | POA: Diagnosis not present

## 2016-05-05 DIAGNOSIS — I951 Orthostatic hypotension: Secondary | ICD-10-CM | POA: Diagnosis not present

## 2016-05-05 DIAGNOSIS — F418 Other specified anxiety disorders: Secondary | ICD-10-CM | POA: Diagnosis not present

## 2016-05-05 DIAGNOSIS — Z6824 Body mass index (BMI) 24.0-24.9, adult: Secondary | ICD-10-CM | POA: Diagnosis not present

## 2016-05-05 DIAGNOSIS — I619 Nontraumatic intracerebral hemorrhage, unspecified: Secondary | ICD-10-CM | POA: Diagnosis not present

## 2016-05-19 ENCOUNTER — Telehealth: Payer: Self-pay

## 2016-05-19 ENCOUNTER — Telehealth: Payer: Self-pay | Admitting: Cardiology

## 2016-05-19 ENCOUNTER — Ambulatory Visit (INDEPENDENT_AMBULATORY_CARE_PROVIDER_SITE_OTHER): Admitting: *Deleted

## 2016-05-19 DIAGNOSIS — I495 Sick sinus syndrome: Secondary | ICD-10-CM

## 2016-05-19 NOTE — Telephone Encounter (Signed)
Confirmed remote transmission w/ pt daughter.   

## 2016-05-19 NOTE — Telephone Encounter (Signed)
Please call her daughter and hospice to find out who manage her dementia and contact them for directions of her behavior issue. I don't think this is related to her cancer, I will be OK to give haldol 0.5mg  every 6 hours as needed for agitation.   Truitt Merle MD

## 2016-05-19 NOTE — Telephone Encounter (Signed)
Erin from hospice called with an update on medication change and behavioral changes.  Recently daughter moved pt from across street into basement appt in daughters house. Wed the pt had a fall, stooled on herself, and the family helped her back up. The pt stopped taking her meds approx 4 days ago.. Friday morning she was angry, packing her bags, and went outside and would not come back inside. Hospice MD rx Haldol 2-4 mg q 4 hrs. Her daughter could not get her to take haldol but the grandson did. Took 2 hours to go to sleep with that. Today angry and upset. Daughter collected urine. Hx UTI a month ago. Urine sent for ua/culture. Care giver was going to try get her to take her zoloft this morning. Any other suggestions from Dr Burr Medico?  Erin from hospice is off at 86, so call the office [phone].

## 2016-05-20 ENCOUNTER — Other Ambulatory Visit: Payer: Self-pay | Admitting: *Deleted

## 2016-05-20 MED ORDER — AMOXICILLIN-POT CLAVULANATE 875-125 MG PO TABS: 1.0000 | ORAL_TABLET | Freq: Two times a day (BID) | ORAL | 0 refills | Status: DC

## 2016-05-20 NOTE — Telephone Encounter (Signed)
Called erin to f/u on yesterday's call. Dr Burr Medico had spoken with hospice last night. Junie Panning did mention urine report is back and looks pretty bad. LVM with [facility] to look for urine report on printer.

## 2016-05-20 NOTE — Telephone Encounter (Signed)
Hospice RN, Junie Panning had called earlier today about urine results that looked bad & asked if we received.  We had not seen so she refaxed & Dr Burr Medico ordered ATB.  Augmentin script sent to [facility] RN & pt's daughter informed via vm.  Erin's ph # is [phone]

## 2016-05-21 NOTE — Progress Notes (Signed)
Remote pacemaker transmission.   

## 2016-05-22 ENCOUNTER — Encounter: Payer: Self-pay | Admitting: Cardiology

## 2016-05-23 DIAGNOSIS — E785 Hyperlipidemia, unspecified: Secondary | ICD-10-CM | POA: Diagnosis not present

## 2016-05-23 DIAGNOSIS — R251 Tremor, unspecified: Secondary | ICD-10-CM | POA: Diagnosis not present

## 2016-05-23 DIAGNOSIS — I951 Orthostatic hypotension: Secondary | ICD-10-CM | POA: Diagnosis not present

## 2016-05-23 DIAGNOSIS — F339 Major depressive disorder, recurrent, unspecified: Secondary | ICD-10-CM | POA: Diagnosis not present

## 2016-05-23 DIAGNOSIS — C7889 Secondary malignant neoplasm of other digestive organs: Secondary | ICD-10-CM | POA: Diagnosis not present

## 2016-05-23 DIAGNOSIS — C259 Malignant neoplasm of pancreas, unspecified: Secondary | ICD-10-CM | POA: Diagnosis not present

## 2016-05-23 DIAGNOSIS — I5030 Unspecified diastolic (congestive) heart failure: Secondary | ICD-10-CM | POA: Diagnosis not present

## 2016-05-23 DIAGNOSIS — R630 Anorexia: Secondary | ICD-10-CM | POA: Diagnosis not present

## 2016-05-23 DIAGNOSIS — I672 Cerebral atherosclerosis: Secondary | ICD-10-CM | POA: Diagnosis not present

## 2016-05-23 DIAGNOSIS — R110 Nausea: Secondary | ICD-10-CM | POA: Diagnosis not present

## 2016-05-23 DIAGNOSIS — I4891 Unspecified atrial fibrillation: Secondary | ICD-10-CM | POA: Diagnosis not present

## 2016-05-23 DIAGNOSIS — K219 Gastro-esophageal reflux disease without esophagitis: Secondary | ICD-10-CM | POA: Diagnosis not present

## 2016-05-23 DIAGNOSIS — I10 Essential (primary) hypertension: Secondary | ICD-10-CM | POA: Diagnosis not present

## 2016-05-23 DIAGNOSIS — R109 Unspecified abdominal pain: Secondary | ICD-10-CM | POA: Diagnosis not present

## 2016-05-27 ENCOUNTER — Telehealth: Payer: Self-pay

## 2016-05-27 DIAGNOSIS — R110 Nausea: Secondary | ICD-10-CM | POA: Diagnosis not present

## 2016-05-27 DIAGNOSIS — C7889 Secondary malignant neoplasm of other digestive organs: Secondary | ICD-10-CM | POA: Diagnosis not present

## 2016-05-27 DIAGNOSIS — I951 Orthostatic hypotension: Secondary | ICD-10-CM | POA: Diagnosis not present

## 2016-05-27 DIAGNOSIS — C259 Malignant neoplasm of pancreas, unspecified: Secondary | ICD-10-CM | POA: Diagnosis not present

## 2016-05-27 DIAGNOSIS — R630 Anorexia: Secondary | ICD-10-CM | POA: Diagnosis not present

## 2016-05-27 DIAGNOSIS — R109 Unspecified abdominal pain: Secondary | ICD-10-CM | POA: Diagnosis not present

## 2016-05-27 MED ORDER — TRAMADOL HCL 50 MG PO TABS: 50.0000 mg | ORAL_TABLET | Freq: Three times a day (TID) | ORAL | 0 refills | Status: DC | PRN

## 2016-05-27 NOTE — Telephone Encounter (Signed)
Ann Kaufman with hospice has spoken with family. The pt is out of tramadol. Ann Kaufman is going to home for initial visit this afternoon. She may be calling with more questions at that time. Tramadol refilled for pt comfort this morning. Lindsey's number [phone]

## 2016-05-28 DIAGNOSIS — R109 Unspecified abdominal pain: Secondary | ICD-10-CM | POA: Diagnosis not present

## 2016-05-28 DIAGNOSIS — R630 Anorexia: Secondary | ICD-10-CM | POA: Diagnosis not present

## 2016-05-28 DIAGNOSIS — R110 Nausea: Secondary | ICD-10-CM | POA: Diagnosis not present

## 2016-05-28 DIAGNOSIS — C7889 Secondary malignant neoplasm of other digestive organs: Secondary | ICD-10-CM | POA: Diagnosis not present

## 2016-05-28 DIAGNOSIS — I951 Orthostatic hypotension: Secondary | ICD-10-CM | POA: Diagnosis not present

## 2016-05-28 DIAGNOSIS — C259 Malignant neoplasm of pancreas, unspecified: Secondary | ICD-10-CM | POA: Diagnosis not present

## 2016-06-03 DIAGNOSIS — R109 Unspecified abdominal pain: Secondary | ICD-10-CM | POA: Diagnosis not present

## 2016-06-03 DIAGNOSIS — R630 Anorexia: Secondary | ICD-10-CM | POA: Diagnosis not present

## 2016-06-03 DIAGNOSIS — I951 Orthostatic hypotension: Secondary | ICD-10-CM | POA: Diagnosis not present

## 2016-06-03 DIAGNOSIS — C259 Malignant neoplasm of pancreas, unspecified: Secondary | ICD-10-CM | POA: Diagnosis not present

## 2016-06-03 DIAGNOSIS — C7889 Secondary malignant neoplasm of other digestive organs: Secondary | ICD-10-CM | POA: Diagnosis not present

## 2016-06-03 DIAGNOSIS — R110 Nausea: Secondary | ICD-10-CM | POA: Diagnosis not present

## 2016-06-04 ENCOUNTER — Telehealth: Payer: Self-pay | Admitting: *Deleted

## 2016-06-04 DIAGNOSIS — I951 Orthostatic hypotension: Secondary | ICD-10-CM | POA: Diagnosis not present

## 2016-06-04 DIAGNOSIS — R630 Anorexia: Secondary | ICD-10-CM | POA: Diagnosis not present

## 2016-06-04 DIAGNOSIS — C7889 Secondary malignant neoplasm of other digestive organs: Secondary | ICD-10-CM | POA: Diagnosis not present

## 2016-06-04 DIAGNOSIS — R110 Nausea: Secondary | ICD-10-CM | POA: Diagnosis not present

## 2016-06-04 DIAGNOSIS — R109 Unspecified abdominal pain: Secondary | ICD-10-CM | POA: Diagnosis not present

## 2016-06-04 DIAGNOSIS — C259 Malignant neoplasm of pancreas, unspecified: Secondary | ICD-10-CM | POA: Diagnosis not present

## 2016-06-04 MED ORDER — ACETAMINOPHEN-CODEINE #3 300-30 MG PO TABS: 1.0000 | ORAL_TABLET | Freq: Four times a day (QID) | ORAL | 0 refills | Status: DC | PRN

## 2016-06-04 MED ORDER — PROCHLORPERAZINE MALEATE 5 MG PO TABS: 5.0000 mg | ORAL_TABLET | Freq: Four times a day (QID) | ORAL | 0 refills | Status: DC | PRN

## 2016-06-04 NOTE — Telephone Encounter (Signed)
Received call from Lindsey/RN/Hospice/[facility] stating pt has had some nausea & vomiting this week & seems to have increased pain per daughter noticing that pt is grimacing & holding abdomen.  Pt has dementia & is challenging to evaluate.  She would like something for pain & nausea.  Discussed with Dr Burr Medico & scripts sent to pharmacy.

## 2016-06-05 DIAGNOSIS — I951 Orthostatic hypotension: Secondary | ICD-10-CM | POA: Diagnosis not present

## 2016-06-05 DIAGNOSIS — R110 Nausea: Secondary | ICD-10-CM | POA: Diagnosis not present

## 2016-06-05 DIAGNOSIS — R109 Unspecified abdominal pain: Secondary | ICD-10-CM | POA: Diagnosis not present

## 2016-06-05 DIAGNOSIS — C259 Malignant neoplasm of pancreas, unspecified: Secondary | ICD-10-CM | POA: Diagnosis not present

## 2016-06-05 DIAGNOSIS — C7889 Secondary malignant neoplasm of other digestive organs: Secondary | ICD-10-CM | POA: Diagnosis not present

## 2016-06-05 DIAGNOSIS — R630 Anorexia: Secondary | ICD-10-CM | POA: Diagnosis not present

## 2016-06-06 DIAGNOSIS — I951 Orthostatic hypotension: Secondary | ICD-10-CM | POA: Diagnosis not present

## 2016-06-06 DIAGNOSIS — R630 Anorexia: Secondary | ICD-10-CM | POA: Diagnosis not present

## 2016-06-06 DIAGNOSIS — R109 Unspecified abdominal pain: Secondary | ICD-10-CM | POA: Diagnosis not present

## 2016-06-06 DIAGNOSIS — R110 Nausea: Secondary | ICD-10-CM | POA: Diagnosis not present

## 2016-06-06 DIAGNOSIS — C7889 Secondary malignant neoplasm of other digestive organs: Secondary | ICD-10-CM | POA: Diagnosis not present

## 2016-06-06 DIAGNOSIS — C259 Malignant neoplasm of pancreas, unspecified: Secondary | ICD-10-CM | POA: Diagnosis not present

## 2016-06-06 LAB — CUP PACEART REMOTE DEVICE CHECK
Battery Impedance: 612 Ohm
Brady Statistic AP VS Percent: 97 %
Brady Statistic AS VS Percent: 3 %
Date Time Interrogation Session: 20171128003935
Implantable Lead Implant Date: 20130319
Implantable Lead Location: 753859
Implantable Lead Model: 5076
Lead Channel Impedance Value: 1167 Ohm
Lead Channel Pacing Threshold Pulse Width: 0.4 ms
Lead Channel Pacing Threshold Pulse Width: 0.4 ms
MDC IDC LEAD IMPLANT DT: 20130319
MDC IDC LEAD LOCATION: 753860
MDC IDC LEAD MODEL: 5592
MDC IDC MSMT BATTERY REMAINING LONGEVITY: 78 mo
MDC IDC MSMT BATTERY VOLTAGE: 2.79 V
MDC IDC MSMT LEADCHNL RA IMPEDANCE VALUE: 595 Ohm
MDC IDC MSMT LEADCHNL RA PACING THRESHOLD AMPLITUDE: 0.75 V
MDC IDC MSMT LEADCHNL RV PACING THRESHOLD AMPLITUDE: 2.5 V
MDC IDC PG IMPLANT DT: 20130319
MDC IDC SET LEADCHNL RA PACING AMPLITUDE: 1.5 V
MDC IDC SET LEADCHNL RV PACING AMPLITUDE: 5 V
MDC IDC SET LEADCHNL RV PACING PULSEWIDTH: 0.4 ms
MDC IDC SET LEADCHNL RV SENSING SENSITIVITY: 2 mV
MDC IDC STAT BRADY AP VP PERCENT: 0 %
MDC IDC STAT BRADY AS VP PERCENT: 0 %

## 2016-06-09 DIAGNOSIS — C259 Malignant neoplasm of pancreas, unspecified: Secondary | ICD-10-CM | POA: Diagnosis not present

## 2016-06-09 DIAGNOSIS — R630 Anorexia: Secondary | ICD-10-CM | POA: Diagnosis not present

## 2016-06-09 DIAGNOSIS — C7889 Secondary malignant neoplasm of other digestive organs: Secondary | ICD-10-CM | POA: Diagnosis not present

## 2016-06-09 DIAGNOSIS — I951 Orthostatic hypotension: Secondary | ICD-10-CM | POA: Diagnosis not present

## 2016-06-09 DIAGNOSIS — R110 Nausea: Secondary | ICD-10-CM | POA: Diagnosis not present

## 2016-06-09 DIAGNOSIS — R109 Unspecified abdominal pain: Secondary | ICD-10-CM | POA: Diagnosis not present

## 2016-06-10 DIAGNOSIS — C7889 Secondary malignant neoplasm of other digestive organs: Secondary | ICD-10-CM | POA: Diagnosis not present

## 2016-06-10 DIAGNOSIS — R109 Unspecified abdominal pain: Secondary | ICD-10-CM | POA: Diagnosis not present

## 2016-06-10 DIAGNOSIS — R110 Nausea: Secondary | ICD-10-CM | POA: Diagnosis not present

## 2016-06-10 DIAGNOSIS — C259 Malignant neoplasm of pancreas, unspecified: Secondary | ICD-10-CM | POA: Diagnosis not present

## 2016-06-10 DIAGNOSIS — I951 Orthostatic hypotension: Secondary | ICD-10-CM | POA: Diagnosis not present

## 2016-06-10 DIAGNOSIS — R630 Anorexia: Secondary | ICD-10-CM | POA: Diagnosis not present

## 2016-06-12 DIAGNOSIS — C7889 Secondary malignant neoplasm of other digestive organs: Secondary | ICD-10-CM | POA: Diagnosis not present

## 2016-06-12 DIAGNOSIS — R110 Nausea: Secondary | ICD-10-CM | POA: Diagnosis not present

## 2016-06-12 DIAGNOSIS — I951 Orthostatic hypotension: Secondary | ICD-10-CM | POA: Diagnosis not present

## 2016-06-12 DIAGNOSIS — R630 Anorexia: Secondary | ICD-10-CM | POA: Diagnosis not present

## 2016-06-12 DIAGNOSIS — C259 Malignant neoplasm of pancreas, unspecified: Secondary | ICD-10-CM | POA: Diagnosis not present

## 2016-06-12 DIAGNOSIS — R109 Unspecified abdominal pain: Secondary | ICD-10-CM | POA: Diagnosis not present

## 2016-06-13 DIAGNOSIS — C259 Malignant neoplasm of pancreas, unspecified: Secondary | ICD-10-CM | POA: Diagnosis not present

## 2016-06-13 DIAGNOSIS — R630 Anorexia: Secondary | ICD-10-CM | POA: Diagnosis not present

## 2016-06-13 DIAGNOSIS — I951 Orthostatic hypotension: Secondary | ICD-10-CM | POA: Diagnosis not present

## 2016-06-13 DIAGNOSIS — R110 Nausea: Secondary | ICD-10-CM | POA: Diagnosis not present

## 2016-06-13 DIAGNOSIS — R109 Unspecified abdominal pain: Secondary | ICD-10-CM | POA: Diagnosis not present

## 2016-06-13 DIAGNOSIS — C7889 Secondary malignant neoplasm of other digestive organs: Secondary | ICD-10-CM | POA: Diagnosis not present

## 2016-06-17 DIAGNOSIS — I951 Orthostatic hypotension: Secondary | ICD-10-CM | POA: Diagnosis not present

## 2016-06-17 DIAGNOSIS — R109 Unspecified abdominal pain: Secondary | ICD-10-CM | POA: Diagnosis not present

## 2016-06-17 DIAGNOSIS — R630 Anorexia: Secondary | ICD-10-CM | POA: Diagnosis not present

## 2016-06-17 DIAGNOSIS — C7889 Secondary malignant neoplasm of other digestive organs: Secondary | ICD-10-CM | POA: Diagnosis not present

## 2016-06-17 DIAGNOSIS — R110 Nausea: Secondary | ICD-10-CM | POA: Diagnosis not present

## 2016-06-17 DIAGNOSIS — C259 Malignant neoplasm of pancreas, unspecified: Secondary | ICD-10-CM | POA: Diagnosis not present

## 2016-06-19 ENCOUNTER — Telehealth: Payer: Self-pay

## 2016-06-19 ENCOUNTER — Telehealth: Payer: Self-pay | Admitting: *Deleted

## 2016-06-19 ENCOUNTER — Ambulatory Visit (INDEPENDENT_AMBULATORY_CARE_PROVIDER_SITE_OTHER): Admitting: *Deleted

## 2016-06-19 ENCOUNTER — Ambulatory Visit
Admission: RE | Admit: 2016-06-19 | Discharge: 2016-06-19 | Disposition: A | Discharge status: Home / Self Care | Source: Ambulatory Visit | Attending: Internal Medicine | Admitting: Internal Medicine

## 2016-06-19 ENCOUNTER — Other Ambulatory Visit: Payer: Self-pay | Admitting: *Deleted

## 2016-06-19 ENCOUNTER — Other Ambulatory Visit: Payer: Self-pay | Admitting: Hematology

## 2016-06-19 DIAGNOSIS — C7889 Secondary malignant neoplasm of other digestive organs: Secondary | ICD-10-CM | POA: Diagnosis not present

## 2016-06-19 DIAGNOSIS — I495 Sick sinus syndrome: Secondary | ICD-10-CM

## 2016-06-19 DIAGNOSIS — M25551 Pain in right hip: Secondary | ICD-10-CM

## 2016-06-19 DIAGNOSIS — C259 Malignant neoplasm of pancreas, unspecified: Secondary | ICD-10-CM

## 2016-06-19 DIAGNOSIS — R630 Anorexia: Secondary | ICD-10-CM | POA: Diagnosis not present

## 2016-06-19 DIAGNOSIS — R109 Unspecified abdominal pain: Secondary | ICD-10-CM | POA: Diagnosis not present

## 2016-06-19 DIAGNOSIS — R110 Nausea: Secondary | ICD-10-CM | POA: Diagnosis not present

## 2016-06-19 DIAGNOSIS — I951 Orthostatic hypotension: Secondary | ICD-10-CM | POA: Diagnosis not present

## 2016-06-19 MED ORDER — ACETAMINOPHEN-CODEINE #3 300-30 MG PO TABS: 1.0000 | ORAL_TABLET | Freq: Four times a day (QID) | ORAL | 0 refills | Status: DC | PRN

## 2016-06-19 NOTE — Telephone Encounter (Signed)
Received call from [place], [place] @ [facility] re:  Pt fell over Christmas.  Pt can still able to ambulate.  However, daughter wanted to know if Dr. Burr Medico could order hip xray when pt goes to [place] today for heart lead checked.  Instructed Ria Comment that pt needs to go to ER for further evaluation of the fall.  Per Ria Comment, hospice nurse did not think pt hurt her hip since pt is still able to ambulate. Informed Ria Comment that Dr. Burr Medico was not in office for the rest of the week.  Asked Ria Comment to consult with hospice physician for assistance with symptom management in Dr. Ernestina Penna absence.  Ria Comment voiced understanding. Ria Comment also requested refill of Tylenol #3 for pt.  Jenny Reichmann, NP notified.  Script sent to pt's pharmacy as ok by NP.

## 2016-06-19 NOTE — Telephone Encounter (Signed)
Called pts daughter to have pt come in for a device interrogation per JA due to RV threshold and impedance increase. Pts daughter agreeable to bring pt to apt today at 1:00pm.

## 2016-06-19 NOTE — Progress Notes (Signed)
Pt seen per JA d/t high RV threshold on remote, RA sensitivity and threshold WNL. RV threshold noted 2.00V@0 .46ms and 1.50V@1 .53ms. RV output programmed at 4.50V@1 .71ms for safety margin. Chest x-ray ordered, pt to obtain today.

## 2016-06-20 DIAGNOSIS — R109 Unspecified abdominal pain: Secondary | ICD-10-CM | POA: Diagnosis not present

## 2016-06-20 DIAGNOSIS — R630 Anorexia: Secondary | ICD-10-CM | POA: Diagnosis not present

## 2016-06-20 DIAGNOSIS — I951 Orthostatic hypotension: Secondary | ICD-10-CM | POA: Diagnosis not present

## 2016-06-20 DIAGNOSIS — C259 Malignant neoplasm of pancreas, unspecified: Secondary | ICD-10-CM | POA: Diagnosis not present

## 2016-06-20 DIAGNOSIS — R110 Nausea: Secondary | ICD-10-CM | POA: Diagnosis not present

## 2016-06-20 DIAGNOSIS — C7889 Secondary malignant neoplasm of other digestive organs: Secondary | ICD-10-CM | POA: Diagnosis not present

## 2016-06-21 DIAGNOSIS — R630 Anorexia: Secondary | ICD-10-CM | POA: Diagnosis not present

## 2016-06-21 DIAGNOSIS — R110 Nausea: Secondary | ICD-10-CM | POA: Diagnosis not present

## 2016-06-21 DIAGNOSIS — I951 Orthostatic hypotension: Secondary | ICD-10-CM | POA: Diagnosis not present

## 2016-06-21 DIAGNOSIS — R109 Unspecified abdominal pain: Secondary | ICD-10-CM | POA: Diagnosis not present

## 2016-06-21 DIAGNOSIS — C259 Malignant neoplasm of pancreas, unspecified: Secondary | ICD-10-CM | POA: Diagnosis not present

## 2016-06-21 DIAGNOSIS — C7889 Secondary malignant neoplasm of other digestive organs: Secondary | ICD-10-CM | POA: Diagnosis not present

## 2016-06-23 ENCOUNTER — Other Ambulatory Visit: Payer: Self-pay | Admitting: Hematology

## 2016-06-23 DIAGNOSIS — C7889 Secondary malignant neoplasm of other digestive organs: Secondary | ICD-10-CM | POA: Diagnosis not present

## 2016-06-23 DIAGNOSIS — I5030 Unspecified diastolic (congestive) heart failure: Secondary | ICD-10-CM | POA: Diagnosis not present

## 2016-06-23 DIAGNOSIS — I10 Essential (primary) hypertension: Secondary | ICD-10-CM | POA: Diagnosis not present

## 2016-06-23 DIAGNOSIS — R109 Unspecified abdominal pain: Secondary | ICD-10-CM | POA: Diagnosis not present

## 2016-06-23 DIAGNOSIS — I4891 Unspecified atrial fibrillation: Secondary | ICD-10-CM | POA: Diagnosis not present

## 2016-06-23 DIAGNOSIS — K219 Gastro-esophageal reflux disease without esophagitis: Secondary | ICD-10-CM | POA: Diagnosis not present

## 2016-06-23 DIAGNOSIS — E785 Hyperlipidemia, unspecified: Secondary | ICD-10-CM | POA: Diagnosis not present

## 2016-06-23 DIAGNOSIS — F339 Major depressive disorder, recurrent, unspecified: Secondary | ICD-10-CM | POA: Diagnosis not present

## 2016-06-23 DIAGNOSIS — R251 Tremor, unspecified: Secondary | ICD-10-CM | POA: Diagnosis not present

## 2016-06-23 DIAGNOSIS — R110 Nausea: Secondary | ICD-10-CM | POA: Diagnosis not present

## 2016-06-23 DIAGNOSIS — C259 Malignant neoplasm of pancreas, unspecified: Secondary | ICD-10-CM | POA: Diagnosis not present

## 2016-06-23 DIAGNOSIS — R630 Anorexia: Secondary | ICD-10-CM | POA: Diagnosis not present

## 2016-06-23 DIAGNOSIS — I951 Orthostatic hypotension: Secondary | ICD-10-CM | POA: Diagnosis not present

## 2016-06-23 DIAGNOSIS — I672 Cerebral atherosclerosis: Secondary | ICD-10-CM | POA: Diagnosis not present

## 2016-06-24 DIAGNOSIS — R630 Anorexia: Secondary | ICD-10-CM | POA: Diagnosis not present

## 2016-06-24 DIAGNOSIS — R109 Unspecified abdominal pain: Secondary | ICD-10-CM | POA: Diagnosis not present

## 2016-06-24 DIAGNOSIS — R110 Nausea: Secondary | ICD-10-CM | POA: Diagnosis not present

## 2016-06-24 DIAGNOSIS — C7889 Secondary malignant neoplasm of other digestive organs: Secondary | ICD-10-CM | POA: Diagnosis not present

## 2016-06-24 DIAGNOSIS — I951 Orthostatic hypotension: Secondary | ICD-10-CM | POA: Diagnosis not present

## 2016-06-24 DIAGNOSIS — C259 Malignant neoplasm of pancreas, unspecified: Secondary | ICD-10-CM | POA: Diagnosis not present

## 2016-06-25 DIAGNOSIS — C259 Malignant neoplasm of pancreas, unspecified: Secondary | ICD-10-CM | POA: Diagnosis not present

## 2016-06-25 DIAGNOSIS — I951 Orthostatic hypotension: Secondary | ICD-10-CM | POA: Diagnosis not present

## 2016-06-25 DIAGNOSIS — R109 Unspecified abdominal pain: Secondary | ICD-10-CM | POA: Diagnosis not present

## 2016-06-25 DIAGNOSIS — R630 Anorexia: Secondary | ICD-10-CM | POA: Diagnosis not present

## 2016-06-25 DIAGNOSIS — R110 Nausea: Secondary | ICD-10-CM | POA: Diagnosis not present

## 2016-06-25 DIAGNOSIS — C7889 Secondary malignant neoplasm of other digestive organs: Secondary | ICD-10-CM | POA: Diagnosis not present

## 2016-06-26 DIAGNOSIS — C259 Malignant neoplasm of pancreas, unspecified: Secondary | ICD-10-CM | POA: Diagnosis not present

## 2016-06-26 DIAGNOSIS — I951 Orthostatic hypotension: Secondary | ICD-10-CM | POA: Diagnosis not present

## 2016-06-26 DIAGNOSIS — R110 Nausea: Secondary | ICD-10-CM | POA: Diagnosis not present

## 2016-06-26 DIAGNOSIS — R109 Unspecified abdominal pain: Secondary | ICD-10-CM | POA: Diagnosis not present

## 2016-06-26 DIAGNOSIS — C7889 Secondary malignant neoplasm of other digestive organs: Secondary | ICD-10-CM | POA: Diagnosis not present

## 2016-06-26 DIAGNOSIS — R630 Anorexia: Secondary | ICD-10-CM | POA: Diagnosis not present

## 2016-06-27 ENCOUNTER — Telehealth: Payer: Self-pay | Admitting: *Deleted

## 2016-06-27 DIAGNOSIS — C7889 Secondary malignant neoplasm of other digestive organs: Secondary | ICD-10-CM | POA: Diagnosis not present

## 2016-06-27 DIAGNOSIS — I951 Orthostatic hypotension: Secondary | ICD-10-CM | POA: Diagnosis not present

## 2016-06-27 DIAGNOSIS — R109 Unspecified abdominal pain: Secondary | ICD-10-CM | POA: Diagnosis not present

## 2016-06-27 DIAGNOSIS — R630 Anorexia: Secondary | ICD-10-CM | POA: Diagnosis not present

## 2016-06-27 DIAGNOSIS — C259 Malignant neoplasm of pancreas, unspecified: Secondary | ICD-10-CM | POA: Diagnosis not present

## 2016-06-27 DIAGNOSIS — R110 Nausea: Secondary | ICD-10-CM | POA: Diagnosis not present

## 2016-06-27 NOTE — Telephone Encounter (Signed)
Received call from [facility]/Hospice/[facility] stating that daughter is interested in pt getting another CT to help with prognosis & to see if any changes.  She states that Dr Lyman Speller felt this would be appropriate since area never biopsied.  Message to Dr Burr Medico to see if she would like to order.

## 2016-06-30 ENCOUNTER — Other Ambulatory Visit: Payer: Self-pay | Admitting: Hematology

## 2016-06-30 DIAGNOSIS — R630 Anorexia: Secondary | ICD-10-CM | POA: Diagnosis not present

## 2016-06-30 DIAGNOSIS — C251 Malignant neoplasm of body of pancreas: Secondary | ICD-10-CM

## 2016-06-30 DIAGNOSIS — I951 Orthostatic hypotension: Secondary | ICD-10-CM | POA: Diagnosis not present

## 2016-06-30 DIAGNOSIS — C7889 Secondary malignant neoplasm of other digestive organs: Secondary | ICD-10-CM | POA: Diagnosis not present

## 2016-06-30 DIAGNOSIS — C259 Malignant neoplasm of pancreas, unspecified: Secondary | ICD-10-CM | POA: Diagnosis not present

## 2016-06-30 DIAGNOSIS — R110 Nausea: Secondary | ICD-10-CM | POA: Diagnosis not present

## 2016-06-30 DIAGNOSIS — R109 Unspecified abdominal pain: Secondary | ICD-10-CM | POA: Diagnosis not present

## 2016-06-30 NOTE — Telephone Encounter (Signed)
Spoke with daughter Lady Deutscher and informed her of Dr. Ernestina Penna instructions.  Santiago Glad voiced understanding.

## 2016-06-30 NOTE — Telephone Encounter (Signed)
  OK with me. I will order and schedule her f/u appointment.   Truitt Merle MD

## 2016-07-01 ENCOUNTER — Other Ambulatory Visit: Payer: Self-pay | Admitting: *Deleted

## 2016-07-01 DIAGNOSIS — C7889 Secondary malignant neoplasm of other digestive organs: Secondary | ICD-10-CM | POA: Diagnosis not present

## 2016-07-01 DIAGNOSIS — R110 Nausea: Secondary | ICD-10-CM | POA: Diagnosis not present

## 2016-07-01 DIAGNOSIS — C259 Malignant neoplasm of pancreas, unspecified: Secondary | ICD-10-CM | POA: Diagnosis not present

## 2016-07-01 DIAGNOSIS — I951 Orthostatic hypotension: Secondary | ICD-10-CM | POA: Diagnosis not present

## 2016-07-01 DIAGNOSIS — R630 Anorexia: Secondary | ICD-10-CM | POA: Diagnosis not present

## 2016-07-01 DIAGNOSIS — R109 Unspecified abdominal pain: Secondary | ICD-10-CM | POA: Diagnosis not present

## 2016-07-01 MED ORDER — PROCHLORPERAZINE MALEATE 5 MG PO TABS: 5.0000 mg | ORAL_TABLET | Freq: Four times a day (QID) | ORAL | 1 refills | Status: AC | PRN

## 2016-07-01 MED ORDER — ALPRAZOLAM 0.25 MG PO TABS: 0.2500 mg | ORAL_TABLET | Freq: Two times a day (BID) | ORAL | 1 refills | Status: AC | PRN

## 2016-07-01 MED ORDER — ACETAMINOPHEN-CODEINE #3 300-30 MG PO TABS: 1.0000 | ORAL_TABLET | Freq: Four times a day (QID) | ORAL | 0 refills | Status: DC | PRN

## 2016-07-02 DIAGNOSIS — R630 Anorexia: Secondary | ICD-10-CM | POA: Diagnosis not present

## 2016-07-02 DIAGNOSIS — R109 Unspecified abdominal pain: Secondary | ICD-10-CM | POA: Diagnosis not present

## 2016-07-02 DIAGNOSIS — I951 Orthostatic hypotension: Secondary | ICD-10-CM | POA: Diagnosis not present

## 2016-07-02 DIAGNOSIS — R110 Nausea: Secondary | ICD-10-CM | POA: Diagnosis not present

## 2016-07-02 DIAGNOSIS — C7889 Secondary malignant neoplasm of other digestive organs: Secondary | ICD-10-CM | POA: Diagnosis not present

## 2016-07-02 DIAGNOSIS — C259 Malignant neoplasm of pancreas, unspecified: Secondary | ICD-10-CM | POA: Diagnosis not present

## 2016-07-03 ENCOUNTER — Telehealth: Payer: Self-pay | Admitting: Hematology

## 2016-07-03 DIAGNOSIS — R630 Anorexia: Secondary | ICD-10-CM | POA: Diagnosis not present

## 2016-07-03 DIAGNOSIS — R110 Nausea: Secondary | ICD-10-CM | POA: Diagnosis not present

## 2016-07-03 DIAGNOSIS — R109 Unspecified abdominal pain: Secondary | ICD-10-CM | POA: Diagnosis not present

## 2016-07-03 DIAGNOSIS — C7889 Secondary malignant neoplasm of other digestive organs: Secondary | ICD-10-CM | POA: Diagnosis not present

## 2016-07-03 DIAGNOSIS — I951 Orthostatic hypotension: Secondary | ICD-10-CM | POA: Diagnosis not present

## 2016-07-03 DIAGNOSIS — C259 Malignant neoplasm of pancreas, unspecified: Secondary | ICD-10-CM | POA: Diagnosis not present

## 2016-07-03 NOTE — Telephone Encounter (Signed)
Appointments scheduled per scheduling message. New schedule mailed out to patient.

## 2016-07-04 DIAGNOSIS — R630 Anorexia: Secondary | ICD-10-CM | POA: Diagnosis not present

## 2016-07-04 DIAGNOSIS — R109 Unspecified abdominal pain: Secondary | ICD-10-CM | POA: Diagnosis not present

## 2016-07-04 DIAGNOSIS — C259 Malignant neoplasm of pancreas, unspecified: Secondary | ICD-10-CM | POA: Diagnosis not present

## 2016-07-04 DIAGNOSIS — I951 Orthostatic hypotension: Secondary | ICD-10-CM | POA: Diagnosis not present

## 2016-07-04 DIAGNOSIS — C7889 Secondary malignant neoplasm of other digestive organs: Secondary | ICD-10-CM | POA: Diagnosis not present

## 2016-07-04 DIAGNOSIS — R110 Nausea: Secondary | ICD-10-CM | POA: Diagnosis not present

## 2016-07-07 DIAGNOSIS — C259 Malignant neoplasm of pancreas, unspecified: Secondary | ICD-10-CM | POA: Diagnosis not present

## 2016-07-07 DIAGNOSIS — R109 Unspecified abdominal pain: Secondary | ICD-10-CM | POA: Diagnosis not present

## 2016-07-07 DIAGNOSIS — R630 Anorexia: Secondary | ICD-10-CM | POA: Diagnosis not present

## 2016-07-07 DIAGNOSIS — C7889 Secondary malignant neoplasm of other digestive organs: Secondary | ICD-10-CM | POA: Diagnosis not present

## 2016-07-07 DIAGNOSIS — I951 Orthostatic hypotension: Secondary | ICD-10-CM | POA: Diagnosis not present

## 2016-07-07 DIAGNOSIS — R110 Nausea: Secondary | ICD-10-CM | POA: Diagnosis not present

## 2016-07-08 DIAGNOSIS — R630 Anorexia: Secondary | ICD-10-CM | POA: Diagnosis not present

## 2016-07-08 DIAGNOSIS — R110 Nausea: Secondary | ICD-10-CM | POA: Diagnosis not present

## 2016-07-08 DIAGNOSIS — R109 Unspecified abdominal pain: Secondary | ICD-10-CM | POA: Diagnosis not present

## 2016-07-08 DIAGNOSIS — I951 Orthostatic hypotension: Secondary | ICD-10-CM | POA: Diagnosis not present

## 2016-07-08 DIAGNOSIS — C7889 Secondary malignant neoplasm of other digestive organs: Secondary | ICD-10-CM | POA: Diagnosis not present

## 2016-07-08 DIAGNOSIS — C259 Malignant neoplasm of pancreas, unspecified: Secondary | ICD-10-CM | POA: Diagnosis not present

## 2016-07-10 DIAGNOSIS — R630 Anorexia: Secondary | ICD-10-CM | POA: Diagnosis not present

## 2016-07-10 DIAGNOSIS — I951 Orthostatic hypotension: Secondary | ICD-10-CM | POA: Diagnosis not present

## 2016-07-10 DIAGNOSIS — R110 Nausea: Secondary | ICD-10-CM | POA: Diagnosis not present

## 2016-07-10 DIAGNOSIS — R109 Unspecified abdominal pain: Secondary | ICD-10-CM | POA: Diagnosis not present

## 2016-07-10 DIAGNOSIS — C7889 Secondary malignant neoplasm of other digestive organs: Secondary | ICD-10-CM | POA: Diagnosis not present

## 2016-07-10 DIAGNOSIS — C259 Malignant neoplasm of pancreas, unspecified: Secondary | ICD-10-CM | POA: Diagnosis not present

## 2016-07-14 ENCOUNTER — Telehealth: Payer: Self-pay | Admitting: *Deleted

## 2016-07-14 ENCOUNTER — Other Ambulatory Visit

## 2016-07-14 DIAGNOSIS — R110 Nausea: Secondary | ICD-10-CM | POA: Diagnosis not present

## 2016-07-14 DIAGNOSIS — I951 Orthostatic hypotension: Secondary | ICD-10-CM | POA: Diagnosis not present

## 2016-07-14 DIAGNOSIS — R109 Unspecified abdominal pain: Secondary | ICD-10-CM | POA: Diagnosis not present

## 2016-07-14 DIAGNOSIS — R630 Anorexia: Secondary | ICD-10-CM | POA: Diagnosis not present

## 2016-07-14 DIAGNOSIS — C259 Malignant neoplasm of pancreas, unspecified: Secondary | ICD-10-CM | POA: Diagnosis not present

## 2016-07-14 DIAGNOSIS — C7889 Secondary malignant neoplasm of other digestive organs: Secondary | ICD-10-CM | POA: Diagnosis not present

## 2016-07-14 NOTE — Telephone Encounter (Signed)
Please call pt's daughter and let her know that. She initially requested the CT, tell her it will not change the management course. Thanks  Truitt Merle MD

## 2016-07-14 NOTE — Telephone Encounter (Signed)
Spoke with daughter Lady Deutscher, and informed her that Hospice will not pay for CT scan.  Santiago Glad voiced understanding , and would like to cancel lab and scan for 07/15/16.

## 2016-07-14 NOTE — Telephone Encounter (Signed)
"  Dr. Burr Medico asked if Hospice pays for scans.  Hospice does not pay for scans.  CT Abdomen Pelvis scheduled on tomorrow but they were unable to tell us the reason for the scan.  Could you give me the reason to provide to Dr. Lyman Speller." Requisition reads 'restaging, has not received any chemotherapy.'  "We do not pay for restaging.  If we don't pay, Medicare won't pay and family will have to pay out of pocket.  Will notify Dr. Lyman Speller and call back before I say a definite no."  Return call received with orders per Dr. Karie Georges.  CT for staging will not be paid for by Hospice.  Family will need to pay out of pocket or she will not be able to remain under Hospice Care.

## 2016-07-15 ENCOUNTER — Ambulatory Visit (HOSPITAL_COMMUNITY): Admission: RE | Admit: 2016-07-15 | Payer: Medicare Other | Source: Ambulatory Visit | Admitting: Hematology

## 2016-07-15 ENCOUNTER — Other Ambulatory Visit

## 2016-07-15 DIAGNOSIS — R109 Unspecified abdominal pain: Secondary | ICD-10-CM | POA: Diagnosis not present

## 2016-07-15 DIAGNOSIS — R110 Nausea: Secondary | ICD-10-CM | POA: Diagnosis not present

## 2016-07-15 DIAGNOSIS — I951 Orthostatic hypotension: Secondary | ICD-10-CM | POA: Diagnosis not present

## 2016-07-15 DIAGNOSIS — C259 Malignant neoplasm of pancreas, unspecified: Secondary | ICD-10-CM | POA: Diagnosis not present

## 2016-07-15 DIAGNOSIS — R630 Anorexia: Secondary | ICD-10-CM | POA: Diagnosis not present

## 2016-07-15 DIAGNOSIS — C7889 Secondary malignant neoplasm of other digestive organs: Secondary | ICD-10-CM | POA: Diagnosis not present

## 2016-07-17 DIAGNOSIS — I951 Orthostatic hypotension: Secondary | ICD-10-CM | POA: Diagnosis not present

## 2016-07-17 DIAGNOSIS — R109 Unspecified abdominal pain: Secondary | ICD-10-CM | POA: Diagnosis not present

## 2016-07-17 DIAGNOSIS — R110 Nausea: Secondary | ICD-10-CM | POA: Diagnosis not present

## 2016-07-17 DIAGNOSIS — C259 Malignant neoplasm of pancreas, unspecified: Secondary | ICD-10-CM | POA: Diagnosis not present

## 2016-07-17 DIAGNOSIS — R630 Anorexia: Secondary | ICD-10-CM | POA: Diagnosis not present

## 2016-07-17 DIAGNOSIS — C7889 Secondary malignant neoplasm of other digestive organs: Secondary | ICD-10-CM | POA: Diagnosis not present

## 2016-07-21 ENCOUNTER — Ambulatory Visit: Admitting: Hematology

## 2016-07-21 DIAGNOSIS — C7889 Secondary malignant neoplasm of other digestive organs: Secondary | ICD-10-CM | POA: Diagnosis not present

## 2016-07-21 DIAGNOSIS — R630 Anorexia: Secondary | ICD-10-CM | POA: Diagnosis not present

## 2016-07-21 DIAGNOSIS — R109 Unspecified abdominal pain: Secondary | ICD-10-CM | POA: Diagnosis not present

## 2016-07-21 DIAGNOSIS — I951 Orthostatic hypotension: Secondary | ICD-10-CM | POA: Diagnosis not present

## 2016-07-21 DIAGNOSIS — R110 Nausea: Secondary | ICD-10-CM | POA: Diagnosis not present

## 2016-07-21 DIAGNOSIS — C259 Malignant neoplasm of pancreas, unspecified: Secondary | ICD-10-CM | POA: Diagnosis not present

## 2016-07-22 ENCOUNTER — Other Ambulatory Visit: Payer: Self-pay | Admitting: *Deleted

## 2016-07-22 ENCOUNTER — Telehealth: Payer: Self-pay | Admitting: *Deleted

## 2016-07-22 DIAGNOSIS — I951 Orthostatic hypotension: Secondary | ICD-10-CM | POA: Diagnosis not present

## 2016-07-22 DIAGNOSIS — C259 Malignant neoplasm of pancreas, unspecified: Secondary | ICD-10-CM | POA: Diagnosis not present

## 2016-07-22 DIAGNOSIS — R109 Unspecified abdominal pain: Secondary | ICD-10-CM | POA: Diagnosis not present

## 2016-07-22 DIAGNOSIS — R630 Anorexia: Secondary | ICD-10-CM | POA: Diagnosis not present

## 2016-07-22 DIAGNOSIS — C251 Malignant neoplasm of body of pancreas: Secondary | ICD-10-CM

## 2016-07-22 DIAGNOSIS — C7889 Secondary malignant neoplasm of other digestive organs: Secondary | ICD-10-CM | POA: Diagnosis not present

## 2016-07-22 DIAGNOSIS — R110 Nausea: Secondary | ICD-10-CM | POA: Diagnosis not present

## 2016-07-22 NOTE — Telephone Encounter (Signed)
Received vm call from [place] RN/Hosp[facility] stating that there has been some confusion about the CT since she has been out of town.  Per Dr Lyman Speller, Hospice will pay for CT scan w/o contrast for comparison & would like to have Dr Burr Medico order this.   Discussed with Dr Burr Medico & called Mendel Ryder back & order placed.

## 2016-07-24 DIAGNOSIS — C259 Malignant neoplasm of pancreas, unspecified: Secondary | ICD-10-CM | POA: Diagnosis not present

## 2016-07-24 DIAGNOSIS — R109 Unspecified abdominal pain: Secondary | ICD-10-CM | POA: Diagnosis not present

## 2016-07-24 DIAGNOSIS — I4891 Unspecified atrial fibrillation: Secondary | ICD-10-CM | POA: Diagnosis not present

## 2016-07-24 DIAGNOSIS — K219 Gastro-esophageal reflux disease without esophagitis: Secondary | ICD-10-CM | POA: Diagnosis not present

## 2016-07-24 DIAGNOSIS — I5030 Unspecified diastolic (congestive) heart failure: Secondary | ICD-10-CM | POA: Diagnosis not present

## 2016-07-24 DIAGNOSIS — R110 Nausea: Secondary | ICD-10-CM | POA: Diagnosis not present

## 2016-07-24 DIAGNOSIS — R630 Anorexia: Secondary | ICD-10-CM | POA: Diagnosis not present

## 2016-07-24 DIAGNOSIS — E785 Hyperlipidemia, unspecified: Secondary | ICD-10-CM | POA: Diagnosis not present

## 2016-07-24 DIAGNOSIS — I10 Essential (primary) hypertension: Secondary | ICD-10-CM | POA: Diagnosis not present

## 2016-07-24 DIAGNOSIS — I951 Orthostatic hypotension: Secondary | ICD-10-CM | POA: Diagnosis not present

## 2016-07-24 DIAGNOSIS — I672 Cerebral atherosclerosis: Secondary | ICD-10-CM | POA: Diagnosis not present

## 2016-07-24 DIAGNOSIS — C7889 Secondary malignant neoplasm of other digestive organs: Secondary | ICD-10-CM | POA: Diagnosis not present

## 2016-07-24 DIAGNOSIS — F339 Major depressive disorder, recurrent, unspecified: Secondary | ICD-10-CM | POA: Diagnosis not present

## 2016-07-24 DIAGNOSIS — R251 Tremor, unspecified: Secondary | ICD-10-CM | POA: Diagnosis not present

## 2016-07-25 ENCOUNTER — Telehealth: Payer: Self-pay | Admitting: *Deleted

## 2016-07-25 DIAGNOSIS — C259 Malignant neoplasm of pancreas, unspecified: Secondary | ICD-10-CM

## 2016-07-25 DIAGNOSIS — I951 Orthostatic hypotension: Secondary | ICD-10-CM | POA: Diagnosis not present

## 2016-07-25 DIAGNOSIS — C7889 Secondary malignant neoplasm of other digestive organs: Secondary | ICD-10-CM | POA: Diagnosis not present

## 2016-07-25 DIAGNOSIS — R110 Nausea: Secondary | ICD-10-CM | POA: Diagnosis not present

## 2016-07-25 DIAGNOSIS — R630 Anorexia: Secondary | ICD-10-CM | POA: Diagnosis not present

## 2016-07-25 DIAGNOSIS — R109 Unspecified abdominal pain: Secondary | ICD-10-CM | POA: Diagnosis not present

## 2016-07-25 MED ORDER — ACETAMINOPHEN-CODEINE #3 300-30 MG PO TABS: 1.0000 | ORAL_TABLET | Freq: Four times a day (QID) | ORAL | 0 refills | Status: AC | PRN

## 2016-07-25 NOTE — Telephone Encounter (Signed)
Received call from [facility]osp[facility] RN/[facility] asking for refill on Tylenol # 3.  OK per Dr Burr Medico & script will be faxed to [facility].

## 2016-07-28 DIAGNOSIS — R630 Anorexia: Secondary | ICD-10-CM | POA: Diagnosis not present

## 2016-07-28 DIAGNOSIS — I951 Orthostatic hypotension: Secondary | ICD-10-CM | POA: Diagnosis not present

## 2016-07-28 DIAGNOSIS — C7889 Secondary malignant neoplasm of other digestive organs: Secondary | ICD-10-CM | POA: Diagnosis not present

## 2016-07-28 DIAGNOSIS — R109 Unspecified abdominal pain: Secondary | ICD-10-CM | POA: Diagnosis not present

## 2016-07-28 DIAGNOSIS — C259 Malignant neoplasm of pancreas, unspecified: Secondary | ICD-10-CM | POA: Diagnosis not present

## 2016-07-28 DIAGNOSIS — R110 Nausea: Secondary | ICD-10-CM | POA: Diagnosis not present

## 2016-07-29 ENCOUNTER — Other Ambulatory Visit: Payer: Self-pay | Admitting: Internal Medicine

## 2016-07-29 DIAGNOSIS — R630 Anorexia: Secondary | ICD-10-CM | POA: Diagnosis not present

## 2016-07-29 DIAGNOSIS — R109 Unspecified abdominal pain: Secondary | ICD-10-CM | POA: Diagnosis not present

## 2016-07-29 DIAGNOSIS — R110 Nausea: Secondary | ICD-10-CM | POA: Diagnosis not present

## 2016-07-29 DIAGNOSIS — C7889 Secondary malignant neoplasm of other digestive organs: Secondary | ICD-10-CM | POA: Diagnosis not present

## 2016-07-29 DIAGNOSIS — C259 Malignant neoplasm of pancreas, unspecified: Secondary | ICD-10-CM | POA: Diagnosis not present

## 2016-07-29 DIAGNOSIS — I951 Orthostatic hypotension: Secondary | ICD-10-CM | POA: Diagnosis not present

## 2016-07-31 DIAGNOSIS — C7889 Secondary malignant neoplasm of other digestive organs: Secondary | ICD-10-CM | POA: Diagnosis not present

## 2016-07-31 DIAGNOSIS — R110 Nausea: Secondary | ICD-10-CM | POA: Diagnosis not present

## 2016-07-31 DIAGNOSIS — C259 Malignant neoplasm of pancreas, unspecified: Secondary | ICD-10-CM | POA: Diagnosis not present

## 2016-07-31 DIAGNOSIS — R109 Unspecified abdominal pain: Secondary | ICD-10-CM | POA: Diagnosis not present

## 2016-07-31 DIAGNOSIS — R630 Anorexia: Secondary | ICD-10-CM | POA: Diagnosis not present

## 2016-07-31 DIAGNOSIS — I951 Orthostatic hypotension: Secondary | ICD-10-CM | POA: Diagnosis not present

## 2016-08-04 DIAGNOSIS — R110 Nausea: Secondary | ICD-10-CM | POA: Diagnosis not present

## 2016-08-04 DIAGNOSIS — C259 Malignant neoplasm of pancreas, unspecified: Secondary | ICD-10-CM | POA: Diagnosis not present

## 2016-08-04 DIAGNOSIS — R630 Anorexia: Secondary | ICD-10-CM | POA: Diagnosis not present

## 2016-08-04 DIAGNOSIS — R109 Unspecified abdominal pain: Secondary | ICD-10-CM | POA: Diagnosis not present

## 2016-08-04 DIAGNOSIS — I951 Orthostatic hypotension: Secondary | ICD-10-CM | POA: Diagnosis not present

## 2016-08-04 DIAGNOSIS — C7889 Secondary malignant neoplasm of other digestive organs: Secondary | ICD-10-CM | POA: Diagnosis not present

## 2016-08-05 ENCOUNTER — Encounter (HOSPITAL_COMMUNITY): Payer: Self-pay

## 2016-08-05 ENCOUNTER — Ambulatory Visit (HOSPITAL_COMMUNITY)
Admission: RE | Admit: 2016-08-05 | Discharge: 2016-08-05 | Disposition: A | Discharge status: Home / Self Care | Source: Ambulatory Visit | Attending: Hematology | Admitting: Hematology

## 2016-08-05 DIAGNOSIS — R110 Nausea: Secondary | ICD-10-CM | POA: Diagnosis not present

## 2016-08-05 DIAGNOSIS — N200 Calculus of kidney: Secondary | ICD-10-CM | POA: Diagnosis not present

## 2016-08-05 DIAGNOSIS — C259 Malignant neoplasm of pancreas, unspecified: Secondary | ICD-10-CM | POA: Diagnosis not present

## 2016-08-05 DIAGNOSIS — C7889 Secondary malignant neoplasm of other digestive organs: Secondary | ICD-10-CM | POA: Diagnosis not present

## 2016-08-05 DIAGNOSIS — R109 Unspecified abdominal pain: Secondary | ICD-10-CM | POA: Diagnosis not present

## 2016-08-05 DIAGNOSIS — R188 Other ascites: Secondary | ICD-10-CM | POA: Insufficient documentation

## 2016-08-05 DIAGNOSIS — J90 Pleural effusion, not elsewhere classified: Secondary | ICD-10-CM | POA: Diagnosis not present

## 2016-08-05 DIAGNOSIS — R630 Anorexia: Secondary | ICD-10-CM | POA: Diagnosis not present

## 2016-08-05 DIAGNOSIS — I951 Orthostatic hypotension: Secondary | ICD-10-CM | POA: Diagnosis not present

## 2016-08-05 DIAGNOSIS — C251 Malignant neoplasm of body of pancreas: Secondary | ICD-10-CM | POA: Insufficient documentation

## 2016-08-06 ENCOUNTER — Telehealth: Payer: Self-pay | Admitting: *Deleted

## 2016-08-06 DIAGNOSIS — C7889 Secondary malignant neoplasm of other digestive organs: Secondary | ICD-10-CM | POA: Diagnosis not present

## 2016-08-06 DIAGNOSIS — C259 Malignant neoplasm of pancreas, unspecified: Secondary | ICD-10-CM | POA: Diagnosis not present

## 2016-08-06 DIAGNOSIS — R109 Unspecified abdominal pain: Secondary | ICD-10-CM | POA: Diagnosis not present

## 2016-08-06 DIAGNOSIS — R630 Anorexia: Secondary | ICD-10-CM | POA: Diagnosis not present

## 2016-08-06 DIAGNOSIS — R110 Nausea: Secondary | ICD-10-CM | POA: Diagnosis not present

## 2016-08-06 DIAGNOSIS — I951 Orthostatic hypotension: Secondary | ICD-10-CM | POA: Diagnosis not present

## 2016-08-06 NOTE — Telephone Encounter (Signed)
Received call from [facility] RN/Hospice/[facility] stating that pt had CT yesterday & Dr Lyman Speller wants Dr Ernestina Penna recommendations regarding results.  He thinks that there is some improvements.  Informed Mendel Ryder that Dr Burr Medico out of office until next Tues & message will be forwarded to her.

## 2016-08-07 DIAGNOSIS — R109 Unspecified abdominal pain: Secondary | ICD-10-CM | POA: Diagnosis not present

## 2016-08-07 DIAGNOSIS — C259 Malignant neoplasm of pancreas, unspecified: Secondary | ICD-10-CM | POA: Diagnosis not present

## 2016-08-07 DIAGNOSIS — R630 Anorexia: Secondary | ICD-10-CM | POA: Diagnosis not present

## 2016-08-07 DIAGNOSIS — I951 Orthostatic hypotension: Secondary | ICD-10-CM | POA: Diagnosis not present

## 2016-08-07 DIAGNOSIS — R110 Nausea: Secondary | ICD-10-CM | POA: Diagnosis not present

## 2016-08-07 DIAGNOSIS — C7889 Secondary malignant neoplasm of other digestive organs: Secondary | ICD-10-CM | POA: Diagnosis not present

## 2016-08-09 DIAGNOSIS — R110 Nausea: Secondary | ICD-10-CM | POA: Diagnosis not present

## 2016-08-09 DIAGNOSIS — R630 Anorexia: Secondary | ICD-10-CM | POA: Diagnosis not present

## 2016-08-09 DIAGNOSIS — R109 Unspecified abdominal pain: Secondary | ICD-10-CM | POA: Diagnosis not present

## 2016-08-09 DIAGNOSIS — C259 Malignant neoplasm of pancreas, unspecified: Secondary | ICD-10-CM | POA: Diagnosis not present

## 2016-08-09 DIAGNOSIS — I951 Orthostatic hypotension: Secondary | ICD-10-CM | POA: Diagnosis not present

## 2016-08-09 DIAGNOSIS — C7889 Secondary malignant neoplasm of other digestive organs: Secondary | ICD-10-CM | POA: Diagnosis not present

## 2016-08-10 DIAGNOSIS — C259 Malignant neoplasm of pancreas, unspecified: Secondary | ICD-10-CM | POA: Diagnosis not present

## 2016-08-10 DIAGNOSIS — R110 Nausea: Secondary | ICD-10-CM | POA: Diagnosis not present

## 2016-08-10 DIAGNOSIS — R630 Anorexia: Secondary | ICD-10-CM | POA: Diagnosis not present

## 2016-08-10 DIAGNOSIS — R109 Unspecified abdominal pain: Secondary | ICD-10-CM | POA: Diagnosis not present

## 2016-08-10 DIAGNOSIS — C7889 Secondary malignant neoplasm of other digestive organs: Secondary | ICD-10-CM | POA: Diagnosis not present

## 2016-08-10 DIAGNOSIS — I951 Orthostatic hypotension: Secondary | ICD-10-CM | POA: Diagnosis not present

## 2016-08-11 DIAGNOSIS — R110 Nausea: Secondary | ICD-10-CM | POA: Diagnosis not present

## 2016-08-11 DIAGNOSIS — I951 Orthostatic hypotension: Secondary | ICD-10-CM | POA: Diagnosis not present

## 2016-08-11 DIAGNOSIS — C7889 Secondary malignant neoplasm of other digestive organs: Secondary | ICD-10-CM | POA: Diagnosis not present

## 2016-08-11 DIAGNOSIS — R109 Unspecified abdominal pain: Secondary | ICD-10-CM | POA: Diagnosis not present

## 2016-08-11 DIAGNOSIS — R630 Anorexia: Secondary | ICD-10-CM | POA: Diagnosis not present

## 2016-08-11 DIAGNOSIS — C259 Malignant neoplasm of pancreas, unspecified: Secondary | ICD-10-CM | POA: Diagnosis not present

## 2016-08-12 NOTE — Telephone Encounter (Signed)
I have called her daughter and left her a detailed message on her cell phone regarding pt's recent CT scan findings. I encouraged her to call me back if she has any questions.   Truitt Merle MD

## 2016-08-13 DIAGNOSIS — R630 Anorexia: Secondary | ICD-10-CM | POA: Diagnosis not present

## 2016-08-13 DIAGNOSIS — R110 Nausea: Secondary | ICD-10-CM | POA: Diagnosis not present

## 2016-08-13 DIAGNOSIS — I951 Orthostatic hypotension: Secondary | ICD-10-CM | POA: Diagnosis not present

## 2016-08-13 DIAGNOSIS — R109 Unspecified abdominal pain: Secondary | ICD-10-CM | POA: Diagnosis not present

## 2016-08-13 DIAGNOSIS — C7889 Secondary malignant neoplasm of other digestive organs: Secondary | ICD-10-CM | POA: Diagnosis not present

## 2016-08-13 DIAGNOSIS — C259 Malignant neoplasm of pancreas, unspecified: Secondary | ICD-10-CM | POA: Diagnosis not present

## 2016-08-14 DIAGNOSIS — C7889 Secondary malignant neoplasm of other digestive organs: Secondary | ICD-10-CM | POA: Diagnosis not present

## 2016-08-14 DIAGNOSIS — R630 Anorexia: Secondary | ICD-10-CM | POA: Diagnosis not present

## 2016-08-14 DIAGNOSIS — C259 Malignant neoplasm of pancreas, unspecified: Secondary | ICD-10-CM | POA: Diagnosis not present

## 2016-08-14 DIAGNOSIS — R110 Nausea: Secondary | ICD-10-CM | POA: Diagnosis not present

## 2016-08-14 DIAGNOSIS — I951 Orthostatic hypotension: Secondary | ICD-10-CM | POA: Diagnosis not present

## 2016-08-14 DIAGNOSIS — R109 Unspecified abdominal pain: Secondary | ICD-10-CM | POA: Diagnosis not present

## 2016-08-18 ENCOUNTER — Encounter: Payer: Self-pay | Admitting: Hematology

## 2016-08-18 ENCOUNTER — Ambulatory Visit (INDEPENDENT_AMBULATORY_CARE_PROVIDER_SITE_OTHER): Admitting: *Deleted

## 2016-08-18 ENCOUNTER — Telehealth: Payer: Self-pay | Admitting: Cardiology

## 2016-08-18 DIAGNOSIS — I495 Sick sinus syndrome: Secondary | ICD-10-CM

## 2016-08-18 DIAGNOSIS — C7889 Secondary malignant neoplasm of other digestive organs: Secondary | ICD-10-CM | POA: Diagnosis not present

## 2016-08-18 DIAGNOSIS — R630 Anorexia: Secondary | ICD-10-CM | POA: Diagnosis not present

## 2016-08-18 DIAGNOSIS — C259 Malignant neoplasm of pancreas, unspecified: Secondary | ICD-10-CM | POA: Diagnosis not present

## 2016-08-18 DIAGNOSIS — I951 Orthostatic hypotension: Secondary | ICD-10-CM | POA: Diagnosis not present

## 2016-08-18 DIAGNOSIS — R109 Unspecified abdominal pain: Secondary | ICD-10-CM | POA: Diagnosis not present

## 2016-08-18 DIAGNOSIS — R110 Nausea: Secondary | ICD-10-CM | POA: Diagnosis not present

## 2016-08-18 NOTE — Telephone Encounter (Signed)
Confirmed remote transmission w/ pt daughter.  Pt daughter concerned that pt is retaining fluid. She is not sure how much. Pt daughter stated that pt has some shortness of breath, wheezing, and ankle edema where pt socks are. She will weigh pt tonight and call tomorrow. Pt has been off lasix since 03-2016. Pt daughter is un sure if the d/c of lasix was a MD recommendation or if she took pt off b/c pt was having difficulty taking medicines due to pancreatic cancer. Informed pt that I would send MD a note now and again when she calls back w/ pt weight. Pt daughter verbalized understanding and agreed w/ this plan.

## 2016-08-19 ENCOUNTER — Encounter: Payer: Self-pay | Admitting: Cardiology

## 2016-08-19 NOTE — Progress Notes (Signed)
Remote pacemaker transmission.   

## 2016-08-20 DIAGNOSIS — C7889 Secondary malignant neoplasm of other digestive organs: Secondary | ICD-10-CM | POA: Diagnosis not present

## 2016-08-20 DIAGNOSIS — C259 Malignant neoplasm of pancreas, unspecified: Secondary | ICD-10-CM | POA: Diagnosis not present

## 2016-08-20 DIAGNOSIS — R110 Nausea: Secondary | ICD-10-CM | POA: Diagnosis not present

## 2016-08-20 DIAGNOSIS — I951 Orthostatic hypotension: Secondary | ICD-10-CM | POA: Diagnosis not present

## 2016-08-20 DIAGNOSIS — R109 Unspecified abdominal pain: Secondary | ICD-10-CM | POA: Diagnosis not present

## 2016-08-20 DIAGNOSIS — R630 Anorexia: Secondary | ICD-10-CM | POA: Diagnosis not present

## 2016-08-20 LAB — CUP PACEART REMOTE DEVICE CHECK
Battery Voltage: 2.78 V
Brady Statistic AP VS Percent: 90 %
Brady Statistic AS VP Percent: 0 %
Brady Statistic AS VS Percent: 9 %
Date Time Interrogation Session: 20180227004820
Implantable Lead Implant Date: 20130319
Implantable Lead Location: 753860
Implantable Lead Model: 5076
Lead Channel Impedance Value: 1514 Ohm
Lead Channel Impedance Value: 539 Ohm
Lead Channel Setting Pacing Amplitude: 5 V
Lead Channel Setting Sensing Sensitivity: 2 mV
MDC IDC LEAD IMPLANT DT: 20130319
MDC IDC LEAD LOCATION: 753859
MDC IDC MSMT BATTERY IMPEDANCE: 718 Ohm
MDC IDC MSMT BATTERY REMAINING LONGEVITY: 73 mo
MDC IDC MSMT LEADCHNL RA PACING THRESHOLD AMPLITUDE: 0.75 V
MDC IDC MSMT LEADCHNL RA PACING THRESHOLD PULSEWIDTH: 0.4 ms
MDC IDC PG IMPLANT DT: 20130319
MDC IDC SET LEADCHNL RA PACING AMPLITUDE: 1.5 V
MDC IDC SET LEADCHNL RV PACING PULSEWIDTH: 1 ms
MDC IDC STAT BRADY AP VP PERCENT: 1 %

## 2016-08-20 NOTE — Telephone Encounter (Signed)
Please call patient to follow-up on symptoms Offer follow-up with APP in the office.

## 2016-08-21 DIAGNOSIS — R109 Unspecified abdominal pain: Secondary | ICD-10-CM | POA: Diagnosis not present

## 2016-08-21 DIAGNOSIS — I4891 Unspecified atrial fibrillation: Secondary | ICD-10-CM | POA: Diagnosis not present

## 2016-08-21 DIAGNOSIS — C259 Malignant neoplasm of pancreas, unspecified: Secondary | ICD-10-CM | POA: Diagnosis not present

## 2016-08-21 DIAGNOSIS — R251 Tremor, unspecified: Secondary | ICD-10-CM | POA: Diagnosis not present

## 2016-08-21 DIAGNOSIS — R110 Nausea: Secondary | ICD-10-CM | POA: Diagnosis not present

## 2016-08-21 DIAGNOSIS — F339 Major depressive disorder, recurrent, unspecified: Secondary | ICD-10-CM | POA: Diagnosis not present

## 2016-08-21 DIAGNOSIS — K219 Gastro-esophageal reflux disease without esophagitis: Secondary | ICD-10-CM | POA: Diagnosis not present

## 2016-08-21 DIAGNOSIS — C7889 Secondary malignant neoplasm of other digestive organs: Secondary | ICD-10-CM | POA: Diagnosis not present

## 2016-08-21 DIAGNOSIS — I672 Cerebral atherosclerosis: Secondary | ICD-10-CM | POA: Diagnosis not present

## 2016-08-21 DIAGNOSIS — I951 Orthostatic hypotension: Secondary | ICD-10-CM | POA: Diagnosis not present

## 2016-08-21 DIAGNOSIS — R630 Anorexia: Secondary | ICD-10-CM | POA: Diagnosis not present

## 2016-08-21 DIAGNOSIS — I5030 Unspecified diastolic (congestive) heart failure: Secondary | ICD-10-CM | POA: Diagnosis not present

## 2016-08-21 DIAGNOSIS — E785 Hyperlipidemia, unspecified: Secondary | ICD-10-CM | POA: Diagnosis not present

## 2016-08-21 DIAGNOSIS — I10 Essential (primary) hypertension: Secondary | ICD-10-CM | POA: Diagnosis not present

## 2016-08-24 DIAGNOSIS — R110 Nausea: Secondary | ICD-10-CM | POA: Diagnosis not present

## 2016-08-24 DIAGNOSIS — C259 Malignant neoplasm of pancreas, unspecified: Secondary | ICD-10-CM | POA: Diagnosis not present

## 2016-08-24 DIAGNOSIS — R630 Anorexia: Secondary | ICD-10-CM | POA: Diagnosis not present

## 2016-08-24 DIAGNOSIS — I951 Orthostatic hypotension: Secondary | ICD-10-CM | POA: Diagnosis not present

## 2016-08-24 DIAGNOSIS — R109 Unspecified abdominal pain: Secondary | ICD-10-CM | POA: Diagnosis not present

## 2016-08-24 DIAGNOSIS — C7889 Secondary malignant neoplasm of other digestive organs: Secondary | ICD-10-CM | POA: Diagnosis not present

## 2016-08-25 DIAGNOSIS — I951 Orthostatic hypotension: Secondary | ICD-10-CM | POA: Diagnosis not present

## 2016-08-25 DIAGNOSIS — R630 Anorexia: Secondary | ICD-10-CM | POA: Diagnosis not present

## 2016-08-25 DIAGNOSIS — R110 Nausea: Secondary | ICD-10-CM | POA: Diagnosis not present

## 2016-08-25 DIAGNOSIS — C7889 Secondary malignant neoplasm of other digestive organs: Secondary | ICD-10-CM | POA: Diagnosis not present

## 2016-08-25 DIAGNOSIS — C259 Malignant neoplasm of pancreas, unspecified: Secondary | ICD-10-CM | POA: Diagnosis not present

## 2016-08-25 DIAGNOSIS — R109 Unspecified abdominal pain: Secondary | ICD-10-CM | POA: Diagnosis not present

## 2016-08-26 ENCOUNTER — Telehealth: Payer: Self-pay | Admitting: Hematology

## 2016-08-26 NOTE — Telephone Encounter (Signed)
Ann Kaufman called from Hospice and said that patient had been discharged and wanted to start aggressive treatment.

## 2016-08-27 ENCOUNTER — Telehealth: Payer: Self-pay | Admitting: Hematology

## 2016-08-27 NOTE — Telephone Encounter (Signed)
lvm to return call to office if pt would like to schedule f/u appt with Dr. Burr Medico per LOS

## 2016-08-28 ENCOUNTER — Telehealth: Payer: Self-pay | Admitting: Hematology

## 2016-08-28 NOTE — Telephone Encounter (Signed)
sw pt dtr to confirm 3/19 appt date/timeper LOS

## 2016-08-28 NOTE — Telephone Encounter (Signed)
Ann Kaufman is going to call the patient to schedule an appointment

## 2016-09-01 NOTE — Progress Notes (Signed)
[facility]  [phone] [phone]) [phone]  Clinic Follow up Note   Patient Care Team: Velna Hatchet, MD as PCP - General (Internal Medicine) Isaias Cowman, MD as Consulting Physician (Internal Medicine) 09/08/2016  CHIEF COMPLAINTS:  Follow up pancreatic cancer   HISTORY OF PRESENTING ILLNESS:  Ann Kaufman 84 y.o. female with past medical history of atrial fibrillation, hypertension, CHF diastolic, hemorrhagic CVA in 2014, and mild dementia is here because of her recent abnormal CT findings. She is accompanied by her daughter to my clinic today. She was referred by her primary care physician.  She had sprained ankle about 2-3 weeks ago, which was managed conservatively. During her physical therapy about 2 weeks ago, she started having dizziness when she stands up, and was found to be orthostatic hypotensive. She also reports intermittent abdominal pain, nausea, worsening fatigue, decreased appetite, and 10-15 lbs weight loss in the past few months. She was seen by her primary care physician Dr. Ardeth Perfect, and underwent CT abdomen and pelvis on 03/20/2016. The scan showed a 3.2 cm mass in the pancreatitis, with direct invasion into the spleen, measuring 4.8 cm. She was referred to [place] for further evaluation.  CURRENT THERAPY: palliative care   INTERIM HISTORY: Ann Kaufman returns for follow-up. Pt has dementia, cognitive issues, and speech issues, so her daughter does most of the talking. She was previously on hospice, but has been doing well and revoked hospice. She now lives in an assisted living facility. Her daughter would like to hear what options she has other than hospice. Pt says that she has been doing better. Her daughter would like PT and OT because the pt is falling very often, most recent fall being yesterday. She has also lost a lot of strength in her legs and can no longer walk well. She has not vomited or felt sick since January. She still doesn't  eat much, but she eats more regularly. She continues to lose weight, but not at the rapid weight she did initially. She does have some abdominal pain, as well as some leg pain from her fall. Denies any other concerns.    MEDICAL HISTORY:  Past Medical History:  Diagnosis Date  . Aphasia    Occasional aphasia & facial numbness from a left frontal lobe intercerebral hemorrhage on Xarelto on 05/18/13.  . Arthritis   . Atrial fibrillation ([place]) 2012   a. Dx in 2012;  b. Rhythm controlled - amiodarone, previously anticoagulated with xarelto (d/c'd 04/2013 in setting of [place]);  b. 04/2013 Echo: EF 55-60%, Gr 1 DD, mild MR, mod dil LA, PAsP 47mmhg  . Depression   . Headache   . Hyperlipidemia   . Hypertension   . Intracerebral hemorrhage ([place])    a. 04/2013 in setting of xarelto therapy.  . Mitral valve prolapse   . Pacemaker   . PAF (paroxysmal atrial fibrillation) ([place])   . Palpitations    Occasional.  . Pericardial effusion   . Peripheral edema    Because she has been standing on her feet more than usual   . Presence of permanent cardiac pacemaker    a. 2012 MDT, placed in [place] (probable tachy-brady).  . Rheumatoid arthritis ([place])   . SOB (shortness of breath)    Occasional SOB  . SSS (sick sinus syndrome) ([facility])    s/p dual chamber pacemaker  . Stroke [facility])     SURGICAL HISTORY: Past Surgical History:  Procedure Laterality Date  . ABDOMINAL HYSTERECTOMY    .  APPENDECTOMY    . BOWEL RESECTION    . BREAST LUMPECTOMY    . LEAD REVISION  09/17/11   s/p atrial lead revision  . PACEMAKER INSERTION     MDT implanted at [place]: Social History   Social History  . Marital status: Widowed    Spouse name: Eddie Dibbles  . Number of children: 3  . Years of education: 12   Occupational History  . retired    Social History Main Topics  . Smoking status: Former Smoker    Packs/day: 0.25    Years: 30.00    Quit date: 11/26/1968  . Smokeless tobacco: Never Used  .  Alcohol use Yes     Comment: wine on occasionally  . Drug use: Yes    Types: Other-see comments  . Sexual activity: Not on file   Other Topics Concern  . Not on file   Social History Narrative   Lives in [place] by herself.  Daughter lives in [place].    FAMILY HISTORY: Family History  Problem Relation Age of Onset  . Stroke Father   . Aneurysm Mother   . Other      negative for premature CAD.  Marland Kitchen Cancer Sister 55    esophageal cancer   . Cancer Other     nephew had lung and kidney cancer     ALLERGIES:  has No Known Allergies.  MEDICATIONS:  Current Outpatient Prescriptions  Medication Sig Dispense Refill  . ALPRAZolam (XANAX) 0.25 MG tablet Take 1 tablet (0.25 mg total) by mouth 2 (two) times daily as needed for anxiety or sleep. 30 tablet 1  . diclofenac sodium (VOLTAREN) 1 % GEL Apply 1 application topically 3 (three) times daily as needed (pain).    Marland Kitchen ergocalciferol (VITAMIN D2) 50000 units capsule TAKE 1 CAPSULE BY MOUTH EVERY 14 DAYS    . famotidine (PEPCID) 20 MG tablet Take 1 tablet (20 mg total) by mouth 2 (two) times daily.    . haloperidol (HALDOL) 2 MG tablet Take 2 mg by mouth every 8 (eight) hours as needed for agitation.    . metoprolol tartrate (LOPRESSOR) 25 MG tablet Take 1 tablet (25 mg total) by mouth 2 (two) times daily. (Patient taking differently: Take 12.5 mg by mouth 2 (two) times daily. ) 60 tablet 1  . nitrofurantoin (MACRODANTIN) 25 MG capsule Take by mouth 2 (two) times daily.    Marland Kitchen senna (SENOKOT) 8.6 MG TABS tablet Take 2 tablets (17.2 mg total) by mouth at bedtime. 120 each 0  . sertraline (ZOLOFT) 50 MG tablet TAKE 50 MG BY MOUTH AT BEDTIME (Patient taking differently: TAKE 50 MG BY MOUTH every morning) 90 tablet 3  . acetaminophen-codeine (TYLENOL #3) 300-30 MG tablet Take 1 tablet by mouth every 6 (six) hours as needed for moderate pain. (Patient not taking: Reported on 09/08/2016) 60 tablet 0  . prochlorperazine (COMPAZINE) 5 MG tablet Take 1  tablet (5 mg total) by mouth every 6 (six) hours as needed for nausea or vomiting. (Patient not taking: Reported on 09/08/2016) 30 tablet 1   No current facility-administered medications for this visit.     REVIEW OF SYSTEMS:   Constitutional: Denies fevers, chills or abnormal night sweats (+) loss of appetite (+) weight loss Eyes: Denies blurriness of vision, double vision or watery eyes Ears, nose, mouth, throat, and face: Denies mucositis or sore throat Respiratory: Denies cough, dyspnea or wheezes Cardiovascular: Denies palpitation, chest discomfort or lower extremity swelling  Gastrointestinal:  Denies nausea, heartburn or change in bowel habits (+) abdominal pain Skin: Denies abnormal skin rashes Lymphatics: Denies new lymphadenopathy or easy bruising Neurological:Denies numbness, tingling or new weaknesses (+) dementia (+) cognitive/speech problems  Behavioral/Psych: Mood is stable, no new changes  Musculoskeletal: (+) falls (+) leg pain from fall All other systems were reviewed with the patient and are negative.  PHYSICAL EXAMINATION: ECOG PERFORMANCE STATUS: 3  Vitals:   09/08/16 1517  BP: 140/67  Pulse: 71  Resp: 17  Temp: 98.2 F (36.8 C)   Filed Weights   GENERAL:alert, no distress and comfortable SKIN: skin color, texture, turgor are normal, no rashes or significant lesions EYES: normal, conjunctiva are pink and non-injected, sclera clear OROPHARYNX:no exudate, no erythema and lips, buccal mucosa, and tongue normal  NECK: supple, thyroid normal size, non-tender, without nodularity LYMPH:  no palpable lymphadenopathy in the cervical, axillary or inguinal LUNGS: clear to auscultation and percussion with normal breathing effort HEART: regular rate & rhythm and no murmurs and no lower extremity edema ABDOMEN:abdomen soft, Mild tenderness in the epigastric area, no organomegaly, normal bowel sounds Musculoskeletal:no cyanosis of digits and no clubbing  PSYCH: alert &  oriented x 3 with fluent speech NEURO: no focal motor/sensory deficits  LABORATORY DATA:  I have reviewed the data as listed CBC Latest Ref Rng & Units 04/10/2016 04/10/2016 04/09/2016  WBC 4.0 - 10.5 K/uL - 4.4 -  Hemoglobin 12.0 - 15.0 g/dL 12.2 11.8(L) 13.6  Hematocrit 36.0 - 46.0 % 36.0 35.9(L) 40.0  Platelets 150 - 400 K/uL - 112(L) -   CMP Latest Ref Rng & Units 04/10/2016 04/10/2016 04/09/2016  Glucose 65 - 99 mg/dL 113(H) 119(H) 82  BUN 6 - 20 mg/dL 16 15 17   Creatinine 0.44 - 1.00 mg/dL 1.00 1.20(H) 1.20(H)  Sodium 135 - 145 mmol/L 136 134(L) 135  Potassium 3.5 - 5.1 mmol/L 3.6 3.7 3.7  Chloride 101 - 111 mmol/L 98(L) 102 96(L)  CO2 22 - 32 mmol/L - 26 -  Calcium 8.9 - 10.3 mg/dL - 8.4(L) -  Total Protein 6.5 - 8.1 g/dL - 5.8(L) -  Total Bilirubin 0.3 - 1.2 mg/dL - 0.9 -  Alkaline Phos 38 - 126 U/L - 221(H) -  AST 15 - 41 U/L - 47(H) -  ALT 14 - 54 U/L - 35 -   Results for ERIK, NESSEL (MRN [id number]) as of 04/08/2016 23:21  Ref. Range 03/25/2016 12:22 03/25/2016 12:22  CA 19-9 Latest Ref Range: 0 - 35 U/mL 25,773 (H)   CEA ([facility]) Latest Ref Range: 0.00 - 5.00 ng/mL  8.19 (H)   RADIOGRAPHIC STUDIES: I have personally reviewed the radiological images as listed and agreed with the findings in the report. Ct Head Wo Contrast  Result Date: 09/07/2016 CLINICAL DATA:  Head injury status post fall. EXAM: CT HEAD WITHOUT CONTRAST CT CERVICAL SPINE WITHOUT CONTRAST TECHNIQUE: Multidetector CT imaging of the head and cervical spine was performed following the standard protocol without intravenous contrast. Multiplanar CT image reconstructions of the cervical spine were also generated. COMPARISON:  12/03/2014 FINDINGS: CT HEAD FINDINGS Brain: No evidence of acute infarction, hemorrhage, hydrocephalus, extra-axial collection or mass lesion/mass effect. Chronic encephalomalacia in the inferior left frontal lobe is unchanged, with mild associated ex vacuo dilatation of the left  frontal horn of the third ventricle. Stable brain parenchymal volume loss and periventricular white matter hypodensities, usually associated with chronic small vessel ischemic disease. Vascular: No hyperdense vessel or unexpected calcification. Skull: Normal. Negative  for fracture or focal lesion. Sinuses/Orbits: No acute finding. Other: Prior bilateral cataract extraction. CT CERVICAL SPINE FINDINGS Alignment: Minimal anterolisthesis of C4 on C5 and posterior listhesis of C5 on C6, and anterolisthesis of C6 on C7, likely degenerative. Skull base and vertebrae: No acute fracture. No primary bone lesion or focal pathologic process. Soft tissues and spinal canal: No prevertebral fluid or swelling. No visible canal hematoma. Disc levels: Multilevel osteoarthritic changes with disc space narrowing, mild vertebral body remodeling and osteophyte formation. Associated moderate to severe posterior facet arthropathy. Upper chest: Negative. Other: None. IMPRESSION: No acute intracranial abnormality. Stable brain parenchymal atrophy and chronic microvascular disease. No evidence of acute injury to the cervical spine. Multilevel osteoarthritic changes with associated mild alignment abnormalities, likely due to severe posterior facet arthropathy. Electronically Signed   By: Fidela Salisbury M.D.   On: 09/07/2016 14:49   Ct Cervical Spine Wo Contrast  Result Date: 09/07/2016 CLINICAL DATA:  Head injury status post fall. EXAM: CT HEAD WITHOUT CONTRAST CT CERVICAL SPINE WITHOUT CONTRAST TECHNIQUE: Multidetector CT imaging of the head and cervical spine was performed following the standard protocol without intravenous contrast. Multiplanar CT image reconstructions of the cervical spine were also generated. COMPARISON:  12/03/2014 FINDINGS: CT HEAD FINDINGS Brain: No evidence of acute infarction, hemorrhage, hydrocephalus, extra-axial collection or mass lesion/mass effect. Chronic encephalomalacia in the inferior left frontal  lobe is unchanged, with mild associated ex vacuo dilatation of the left frontal horn of the third ventricle. Stable brain parenchymal volume loss and periventricular white matter hypodensities, usually associated with chronic small vessel ischemic disease. Vascular: No hyperdense vessel or unexpected calcification. Skull: Normal. Negative for fracture or focal lesion. Sinuses/Orbits: No acute finding. Other: Prior bilateral cataract extraction. CT CERVICAL SPINE FINDINGS Alignment: Minimal anterolisthesis of C4 on C5 and posterior listhesis of C5 on C6, and anterolisthesis of C6 on C7, likely degenerative. Skull base and vertebrae: No acute fracture. No primary bone lesion or focal pathologic process. Soft tissues and spinal canal: No prevertebral fluid or swelling. No visible canal hematoma. Disc levels: Multilevel osteoarthritic changes with disc space narrowing, mild vertebral body remodeling and osteophyte formation. Associated moderate to severe posterior facet arthropathy. Upper chest: Negative. Other: None. IMPRESSION: No acute intracranial abnormality. Stable brain parenchymal atrophy and chronic microvascular disease. No evidence of acute injury to the cervical spine. Multilevel osteoarthritic changes with associated mild alignment abnormalities, likely due to severe posterior facet arthropathy. Electronically Signed   By: Fidela Salisbury M.D.   On: 09/07/2016 14:49   Dg Hip Unilat W Or Wo Pelvis 2-3 Views Right  Result Date: 09/07/2016 CLINICAL DATA:  83 year old female with history of right-sided hip pain after a fall. EXAM: DG HIP (WITH OR WITHOUT PELVIS) 2-3V RIGHT COMPARISON:  06/19/2016. FINDINGS: There is no evidence of hip fracture or dislocation. Mild bilateral hip joint osteoarthritis. There is no other focal bone abnormality. IMPRESSION: 1. No acute radiographic abnormality of the bony pelvis or right hip. Electronically Signed   By: Vinnie Langton M.D.   On: 09/07/2016 15:00     ASSESSMENT & PLAN:  83 y.o. Caucasian female, with multiple comorbidities, presents with worsening abdominal pain, nausea, fatigue, anorexia, and weight loss, and orthostatic hypotension.  1. Pancreatic tail mass, with direct invasion to spleen, likely pancreatic cancer, with node and possible peritoneal metastasis -I previously reviewed her CT scan images with patient and her daughter in great detail. The pancreatic to mass is highly suspicious for malignancy, especially adenocarcinoma. It appears has directly invading  the spleen, possible surrounding adenopathy. -I previously reviewed her PET scan findings which showed hypermetabolic pancreatic tail mass with spleen invasion, hypermetabolic soft tissue versus lymph node in the pancreatic head and and hepatoduodenal ligament, and a possible peritoneum or lymph node metastasis -Her CA 19.9 is significantly elevated, with the above scan findings, this is most consistent with pancreatic adenocarcinoma -We previously discussed the options of biopsy. Unfortunately percutaneous biopsy may not be feasible, endoscopy with EUS biopsy is needed to get tissue diagnosis, which may require general anesthesia. Given her advanced age, comorbidities, and really no treatment options even after diagnosis, I do not strongly feel she needs the tissue biopsy to confirm the diagnosis. Patient and her daughter agrees  -The patient is not a candidate for chemotherapy. We discussed the goal of therapy is palliative, to preserve or improve her quality of life  -We again discussed possible worsening symptoms related to her cancer progression, such as pain, anorexia, nausea, and management strategy. We again discussed the option of management her symptoms and dehydration at her nursing facility -she was under Hospice care previously, her daughter revoked it because she wants her to have physical therapy and occupational therapy. She was moved to an assisted ndependent living  facility with a caregiver. Her daughter is very involved in her care.  2. Nausea, anorexia, and epigastric Discomfort  -We previously discussed different options for appetite stimulator. Given her multiple GI symptoms, especially her concern of low appetite, I recommend her to try Marinol 2.5 mg twice daily, she is tolerating well, for nausea and appetite has improved, may consider increasing her dose if needed. -She did not tolerate tramadol well, due to dizziness and sedation. I recommend her to try half pill, and start at bedtime, as needed for pain. -She will continue nutrition supplement, her daughter is interested in nutrition consult, I'll ask our dietitian to give her a call.  3. Atrial fibrillation, hypertension, diastolic CHF, please and orthostatic hypotension -She will continue follow-up with her primary care physician -She is not on anticoagulation due to her prior history of hemorrhagic stroke when she was on Xarelto  -Her medication has been adjusted due to her orthostatic hypertension. I encouraged her to drink more fluids.  4. History of TIA and hemorrhagic stroke  5. Moderate dementia   6. Goal of care and code status  -The goal of care is palliative, preserve record of life, her daughter agrees -She is DO NOT RESUSCITATE and DO NOT INTUBATE  7. Falls -due to leg muscle weakness -Most recent was 09/07/2016 -Daughter would like her to have PT and OT at the assistant living     Plan -I recommend her to have lab test including CBC and CMP, to see if she is dehydrated or need blood transfusion -she will get PT/OT at the facility.  -Return as needed   All questions were answered. The patient knows to call the clinic with any problems, questions or concerns . I spent 20 minutes counseling the patient face to face. The total time spent in the appointment was 25 minutes and more than 50% was on counseling.  This document serves as a record of services personally performed  by Truitt Merle, MD. It was created on her behalf by Martinique Casey, a trained medical scribe. The creation of this record is based on the scribe's personal observations and the provider's statements to them. This document has been checked and approved by the attending provider.  I have reviewed the above documentation for  accuracy and completeness and I agree with the above.   Truitt Merle, MD 09/08/2016

## 2016-09-04 ENCOUNTER — Ambulatory Visit (INDEPENDENT_AMBULATORY_CARE_PROVIDER_SITE_OTHER): Payer: Medicare Other | Admitting: Physician Assistant

## 2016-09-04 VITALS — BP 124/64 | HR 62 | Ht 64.0 in | Wt 112.0 lb

## 2016-09-04 DIAGNOSIS — Z66 Do not resuscitate: Secondary | ICD-10-CM | POA: Diagnosis not present

## 2016-09-04 DIAGNOSIS — Z950 Presence of cardiac pacemaker: Secondary | ICD-10-CM | POA: Diagnosis not present

## 2016-09-04 DIAGNOSIS — I10 Essential (primary) hypertension: Secondary | ICD-10-CM | POA: Diagnosis not present

## 2016-09-04 DIAGNOSIS — I480 Paroxysmal atrial fibrillation: Secondary | ICD-10-CM

## 2016-09-04 DIAGNOSIS — E785 Hyperlipidemia, unspecified: Secondary | ICD-10-CM | POA: Diagnosis not present

## 2016-09-04 DIAGNOSIS — G8929 Other chronic pain: Secondary | ICD-10-CM | POA: Diagnosis not present

## 2016-09-04 DIAGNOSIS — I482 Chronic atrial fibrillation: Secondary | ICD-10-CM | POA: Diagnosis not present

## 2016-09-04 DIAGNOSIS — F419 Anxiety disorder, unspecified: Secondary | ICD-10-CM | POA: Diagnosis not present

## 2016-09-04 DIAGNOSIS — I5032 Chronic diastolic (congestive) heart failure: Secondary | ICD-10-CM

## 2016-09-04 NOTE — Progress Notes (Signed)
Cardiology Office Note Date:  09/04/2016  Patient ID:  Ann Kaufman, Ann Kaufman Feb 09, 1930, MRN [id number] PCP:  Velna Hatchet, MD  Cardiologist:  Dr. Rayann Heman    Chief Complaint: daughter is concerned about possible fluid retention  History of Present Illness: Ann Kaufman is a 83 y.o. female with history of sick sinus syndrome w/PPM, AFib w/hx of ICH on Xarelto, HTN, HLD, RA.  She comes to the office to be seen for Dr. Rayann Heman.  She was last seen by him in August, at that time doing well without changes made to her regime.  Since that visit, she was found with pancreatic cancer (pancreatic mass with direct invasion into the spleen, possible surrounding adenopathy, possible peritoneal or lymph mets), not felt a candidate for chemo and recommended palliative measures, was on Hosice until recently.  The patient comes today accompanied by her daughter. The daughter helps with HPI of her recent health issues.  The daughter had requested a f/u CT scan to establish the rate of progression of her Mom's cancer, she reports being told that there had been no progression.  Given this she felt like the original dx of cancer may be questionable since no biopsy was done and has read that pancreatic cancer is rapidly progressive and if + would/should have.  They meed with oncology Monday to discuss further.  They have opted out of Hospice and the patient now living in an ALF where she will start PT in hopes of re strengthening and improvement from her functional decline.  She has unintentionally lost 50 pounds, and has had marked functional decline requiring assist with most of her activities.  They come here today concerned about some swelling noted by her ankles.  The patient denies ad the daughter concurs she has not had c/o any kind of CP, palpitations, and while very weak, no syncope, though suffers from significant orthostatic dizziness with poor PO intake, and occasional bouts of N/V.  She was weaned off most of  her medicines in Hospice, and while she had previsouly had some SOB wioth exertion, feels stopping the amiodarone made her breathing better and declines any ongoing feelings of SOB, unleess really exerting herself.  Device information: MDT dual chamber PPM, implanted 2013, Dr. Saralyn Pilar  AF history Off a/c 2/2 ICH on xarelto AAD: amiodarone  Past Medical History:  Diagnosis Date  . Aphasia    Occasional aphasia & facial numbness from a left frontal lobe intercerebral hemorrhage on Xarelto on 05/18/13.  . Arthritis   . Atrial fibrillation ([place]) 2012   a. Dx in 2012;  b. Rhythm controlled - amiodarone, previously anticoagulated with xarelto (d/c'd 04/2013 in setting of [place]);  b. 04/2013 Echo: EF 55-60%, Gr 1 DD, mild MR, mod dil LA, PAsP 82mmhg  . Depression   . Headache   . Hyperlipidemia   . Hypertension   . Intracerebral hemorrhage ([place])    a. 04/2013 in setting of xarelto therapy.  . Mitral valve prolapse   . Pacemaker   . PAF (paroxysmal atrial fibrillation) ([place])   . Palpitations    Occasional.  . Pericardial effusion   . Peripheral edema    Because she has been standing on her feet more than usual   . Presence of permanent cardiac pacemaker    a. 2012 MDT, placed in [place] (probable tachy-brady).  . Rheumatoid arthritis ([place])   . SOB (shortness of breath)    Occasional SOB  . SSS (sick sinus syndrome) ([place])  s/p dual chamber pacemaker  . Stroke [facility])     Past Surgical History:  Procedure Laterality Date  . ABDOMINAL HYSTERECTOMY    . APPENDECTOMY    . BOWEL RESECTION    . BREAST LUMPECTOMY    . LEAD REVISION  09/17/11   s/p atrial lead revision  . PACEMAKER INSERTION     MDT implanted at [facility]    Current Outpatient Prescriptions  Medication Sig Dispense Refill  . acetaminophen-codeine (TYLENOL #3) 300-30 MG tablet Take 1 tablet by mouth every 6 (six) hours as needed for moderate pain. 60 tablet 0  . ALPRAZolam (XANAX) 0.25 MG tablet Take 1 tablet (0.25  mg total) by mouth 2 (two) times daily as needed for anxiety or sleep. 30 tablet 1  . amoxicillin-clavulanate (AUGMENTIN) 875-125 MG tablet Take 1 tablet by mouth 2 (two) times daily. 6 tablet 0  . diclofenac sodium (VOLTAREN) 1 % GEL Apply 1 application topically 3 (three) times daily as needed (pain).    Marland Kitchen ergocalciferol (VITAMIN D2) 50000 units capsule TAKE 1 CAPSULE BY MOUTH EVERY 14 DAYS    . famotidine (PEPCID) 20 MG tablet Take 1 tablet (20 mg total) by mouth 2 (two) times daily.    . haloperidol (HALDOL) 2 MG tablet Take 2 mg by mouth every 8 (eight) hours as needed for agitation.    . levETIRAcetam (KEPPRA) 500 MG tablet TAKE 1 TABLET TWICE A DAY 180 tablet 0  . metoprolol tartrate (LOPRESSOR) 25 MG tablet Take 1 tablet (25 mg total) by mouth 2 (two) times daily. (Patient taking differently: Take 12.5 mg by mouth 2 (two) times daily. ) 60 tablet 1  . nitrofurantoin (MACRODANTIN) 25 MG capsule Take by mouth 2 (two) times daily.    . prochlorperazine (COMPAZINE) 5 MG tablet Take 1 tablet (5 mg total) by mouth every 6 (six) hours as needed for nausea or vomiting. 30 tablet 1  . senna (SENOKOT) 8.6 MG TABS tablet Take 2 tablets (17.2 mg total) by mouth at bedtime. 120 each 0  . sertraline (ZOLOFT) 50 MG tablet TAKE 50 MG BY MOUTH AT BEDTIME 90 tablet 3  . vitamin B-12 (CYANOCOBALAMIN) 1000 MCG tablet Take 1 tablet (1,000 mcg total) by mouth every evening. 30 tablet 1   No current facility-administered medications for this visit.     Allergies:   Patient has no known allergies.   Social History:  The patient  reports that she quit smoking about 47 years ago. She has a 7.50 pack-year smoking history. She has never used smokeless tobacco. She reports that she drinks alcohol. She reports that she uses drugs, including Other-see comments.   Family History:  The patient's family history includes Aneurysm in her mother; Cancer in her other; Cancer (age of onset: 20) in her sister; Stroke in her  father.  ROS:  Please see the history of present illness.  All other systems are reviewed and otherwise negative.   PHYSICAL EXAM:  VS:  BP 124/64   Pulse 62   Ht 5\' 4"  (1.626 m)   Wt 112 lb (50.8 kg)   BMI 19.22 kg/m  BMI: Body mass index is 19.22 kg/m.  Weight may be inaccurate given the patient required much support to stand Very thin, frail appearing female, in no acute distress  HEENT: normocephalic, atraumatic  Neck: no JVD, carotid bruits or masses Cardiac:  RRR; no significant murmurs, no rubs, or gallops Lungs:  CTA b/l  no wheezing, rhonchi or rales  Abd: soft,  nontender MS: no deformity, significant atrophy Ext: trace ankle edema  Skin: warm and dry, no rash Neuro:  No gross deficits appreciated Psych: euthymic mood, full affect The patient was observed getting weighed today, she arrives in a wheelchair and required 2 person support to stand on the scale.  PPM site is stable, no tethering or discomfort   EKG:  Done 04/10/16 was A paced, V sensed rhythm Device interrogation done today by industry, reviewed by myself w/Dr. Caryl Comes who is in clinic: battery and lead measurements consistent with previous.  RV threshold/impedence are elevated, AMS episodes noted, AF burden 0.9%, V pacing 1%, RV lead output was programmed  to 4.000V/1.11ms, capture management to monitor only.  12/03/14: TTE Study Conclusions - Left ventricle: The cavity size was normal. Wall thickness was   increased in a pattern of mild LVH. Systolic function was normal.   The estimated ejection fraction was in the range of 60% to 65%.   Wall motion was normal; there were no regional wall motion   abnormalities. Doppler parameters are consistent with abnormal   left ventricular relaxation (grade 1 diastolic dysfunction). - Aortic valve: Mildly calcified annulus. Trileaflet. There was   trivial regurgitation. - Mitral valve: Calcified annulus. There was trivial regurgitation. - Left atrium: The atrium  was mildly to moderately dilated. - Right ventricle: Pacer wire or catheter noted in right ventricle. - Right atrium: Pacer wire or catheter noted in right atrium.   Central venous pressure (est): 3 mm Hg. - Atrial septum: No defect or patent foramen ovale was identified. - Tricuspid valve: There was moderate regurgitation. - Pulmonary arteries: PA peak pressure: 29 mm Hg (S). - Pericardium, extracardiac: There was no pericardial effusion. Impressions - Mild LVH with LVEF 16-10%, grade 1 diastolic dysfunction. Mild to   moderate left atrial enlargement. Device wire in right heart.   Moderate tricuspid regurgitation with PASP 29 mmHg.  Recent Labs: 04/10/2016: ALT 35; BUN 16; Creatinine, Ser 1.00; Hemoglobin 12.2; Platelets 112; Potassium 3.6; Sodium 136  No results found for requested labs within last 8760 hours.   CrCl cannot be calculated (Patient's most recent lab result is older than the maximum 21 days allowed.).   Wt Readings from Last 3 Encounters:  09/04/16 112 lb (50.8 kg)  05/01/16 141 lb (64 kg)  04/30/16 141 lb 11.2 oz (64.3 kg)     Other studies reviewed: Additional studies/records reviewed today include: summarized above  ASSESSMENT AND PLAN:  1. PPM   stable device function, programming as noted above, in review of most recent device checks, RV lead finding not new, and notes that Dr. Rayann Heman is aware and given not device dependent, only V paces 1%, will continue to monitor    The patient/daughter were aware of the lead findings    Will continue Q 12month remote checks     2. Mild ankle edema     Likely dependent, poor PO intake likely low albumin as well     No other findings to suggest fluid OL, lungs are clear, no c/o SOB  Elevate legs when seated, she has known problems with orthostatic dizziness/falls, would not suggest diuretic, but recommended to try compression stockings to aid in bother her mild swelling and othostasis.  3. Paroxysmal AFib      CHA2Ds2Vasc is 4, off a/c with ICH on Xarelto     She was taken off her amiodarone months ago when entered into Hospice.       She feels like her breathing  is improved without it (though was taken off several medicines) her daughter after doing reading is pretty certain the amiodarone was making her SOB     Will not resume  4. Working Diagnosis of pancreatic cancer w/possible mets     Discontinued Hospice to re-evaluate cancer diagnosis and current efforts in plan for PT      f/u with oncology planned for next week    Disposition: 3 month remote pacer check, 6 months with Dr. Rayann Heman, sooner if needed.  Current medicines are reviewed at length with the patient today.  The patient did not have any concerns regarding medicines.  Haywood Lasso, PA-C 09/04/2016 12:14 PM     [address] [place] [phone] (office)  [phone] (fax)

## 2016-09-04 NOTE — Patient Instructions (Addendum)
Medication Instructions:   Your physician recommends that you continue on your current medications as directed. Please refer to the Current Medication list given to you today.   If you need a refill on your cardiac medications before your next appointment, please call your pharmacy.  Labwork: NONE ORDERED  TODAY    Testing/Procedures: NONE ORDERED  TODAY   Follow-Up: Your physician wants you to follow-up in:  IN 6  MONTHS WITH DR  Rayann Heman  You will receive a reminder letter in the mail two months in advance. If you don't receive a letter, please call our office to schedule the follow-up appointment.  Remote monitoring is used to monitor your Pacemaker of ICD from home. This monitoring reduces the number of office visits required to check your device to one time per year. It allows [place] to keep an eye on the functioning of your device to ensure it is working properly. You are scheduled for a device check from home on . [id number] may send your transmission at any time that day. If you have a wireless device, the transmission will be sent automatically. After your physician reviews your transmission, you will receive a postcard with your next transmission date.       Any Other Special Instructions Will Be Listed Below (If Applicable).

## 2016-09-07 ENCOUNTER — Emergency Department (HOSPITAL_COMMUNITY)
Admission: EM | Admit: 2016-09-07 | Discharge: 2016-09-07 | Disposition: A | Discharge status: Home / Self Care | Payer: Medicare Other | Attending: Emergency Medicine | Admitting: Emergency Medicine

## 2016-09-07 ENCOUNTER — Emergency Department (HOSPITAL_COMMUNITY): Payer: Medicare Other

## 2016-09-07 ENCOUNTER — Encounter (HOSPITAL_COMMUNITY): Payer: Self-pay

## 2016-09-07 DIAGNOSIS — W050XXA Fall from non-moving wheelchair, initial encounter: Secondary | ICD-10-CM | POA: Insufficient documentation

## 2016-09-07 DIAGNOSIS — M25551 Pain in right hip: Secondary | ICD-10-CM | POA: Diagnosis not present

## 2016-09-07 DIAGNOSIS — Y999 Unspecified external cause status: Secondary | ICD-10-CM | POA: Diagnosis not present

## 2016-09-07 DIAGNOSIS — Z79899 Other long term (current) drug therapy: Secondary | ICD-10-CM | POA: Diagnosis not present

## 2016-09-07 DIAGNOSIS — Z87891 Personal history of nicotine dependence: Secondary | ICD-10-CM | POA: Diagnosis not present

## 2016-09-07 DIAGNOSIS — Y929 Unspecified place or not applicable: Secondary | ICD-10-CM | POA: Diagnosis not present

## 2016-09-07 DIAGNOSIS — Z950 Presence of cardiac pacemaker: Secondary | ICD-10-CM | POA: Diagnosis not present

## 2016-09-07 DIAGNOSIS — F0390 Unspecified dementia without behavioral disturbance: Secondary | ICD-10-CM | POA: Diagnosis present

## 2016-09-07 DIAGNOSIS — I110 Hypertensive heart disease with heart failure: Secondary | ICD-10-CM | POA: Insufficient documentation

## 2016-09-07 DIAGNOSIS — W19XXXA Unspecified fall, initial encounter: Secondary | ICD-10-CM | POA: Diagnosis not present

## 2016-09-07 DIAGNOSIS — S79911A Unspecified injury of right hip, initial encounter: Secondary | ICD-10-CM | POA: Diagnosis not present

## 2016-09-07 DIAGNOSIS — T148XXA Other injury of unspecified body region, initial encounter: Secondary | ICD-10-CM | POA: Diagnosis not present

## 2016-09-07 DIAGNOSIS — F333 Major depressive disorder, recurrent, severe with psychotic symptoms: Secondary | ICD-10-CM | POA: Diagnosis not present

## 2016-09-07 DIAGNOSIS — Y939 Activity, unspecified: Secondary | ICD-10-CM | POA: Diagnosis not present

## 2016-09-07 DIAGNOSIS — S0990XA Unspecified injury of head, initial encounter: Secondary | ICD-10-CM | POA: Diagnosis not present

## 2016-09-07 DIAGNOSIS — I5032 Chronic diastolic (congestive) heart failure: Secondary | ICD-10-CM | POA: Insufficient documentation

## 2016-09-07 DIAGNOSIS — Z8673 Personal history of transient ischemic attack (TIA), and cerebral infarction without residual deficits: Secondary | ICD-10-CM | POA: Insufficient documentation

## 2016-09-07 DIAGNOSIS — S199XXA Unspecified injury of neck, initial encounter: Secondary | ICD-10-CM | POA: Diagnosis not present

## 2016-09-07 DIAGNOSIS — R4182 Altered mental status, unspecified: Secondary | ICD-10-CM | POA: Diagnosis not present

## 2016-09-07 NOTE — ED Provider Notes (Signed)
[facility] DEPT Provider Note   CSN: [id number] Arrival date & time: 09/07/16  1317     History   Chief Complaint Chief Complaint  Patient presents with  . Fall    HPI Ann Kaufman is a 83 y.o. female.  HPI  Level V caveat due to dementia 83 year old female who presents after fall. From [facility]. Per facility, she was eating lunch and slid out of her wheelchair onto the ground. Complains of right hip pain. States she did hit her head, but does not recall how she fell. History of dementia, PAF, h/o ICH, MVP, PPM. Not on blood thinners. Past Medical History:  Diagnosis Date  . Aphasia    Occasional aphasia & facial numbness from a left frontal lobe intercerebral hemorrhage on Xarelto on 05/18/13.  . Arthritis   . Atrial fibrillation ([place]) 2012   a. Dx in 2012;  b. Rhythm controlled - amiodarone, previously anticoagulated with xarelto (d/c'd 04/2013 in setting of [place]);  b. 04/2013 Echo: EF 55-60%, Gr 1 DD, mild MR, mod dil LA, PAsP 71mmhg  . Depression   . Headache   . Hyperlipidemia   . Hypertension   . Intracerebral hemorrhage ([place])    a. 04/2013 in setting of xarelto therapy.  . Mitral valve prolapse   . Pacemaker   . PAF (paroxysmal atrial fibrillation) ([place])   . Palpitations    Occasional.  . Pericardial effusion   . Peripheral edema    Because she has been standing on her feet more than usual   . Presence of permanent cardiac pacemaker    a. 2012 MDT, placed in [place] (probable tachy-brady).  . Rheumatoid arthritis ([place])   . SOB (shortness of breath)    Occasional SOB  . SSS (sick sinus syndrome) (HCC)    s/p dual chamber pacemaker  . Stroke [facility])     Patient Active Problem List   Diagnosis Date Noted  . Pancreatic cancer ([place]) 03/25/2016  . Osteoarthritis of right knee 03/19/2015  . Trochanteric bursitis of both hips 03/19/2015  . Gait disorder 03/19/2015  . Hyperlipidemia 12/08/2014  . Acute right-sided weakness 12/02/2014  .  Pulmonary HTN 12/02/2014  . DOE (dyspnea on exertion) 12/02/2014  . Chronic diastolic heart failure, NYHA class 1 ([place]) 12/02/2014  . TIA (transient ischemic attack) 12/02/2014  . Cerebral infarction due to unspecified mechanism   . Myofascial muscle pain 03/20/2014  . Sick sinus syndrome ([place]) 10/19/2013  . Seizure disorder after stroke 07/24/2013  . Syncope 05/30/2013  . Pacemaker 05/30/2013  . Chronic anticoagulation 05/19/2013  . History of ICH (intracerebral hemorrhage) 05/18/2013  . Atrial fibrillation, chronic ([place]) 04/06/2013  . Atherosclerosis of aorta ([place]) 04/06/2013  . Pleural effusion 11/27/2011  . HTN (hypertension) 11/27/2011    Past Surgical History:  Procedure Laterality Date  . ABDOMINAL HYSTERECTOMY    . APPENDECTOMY    . BOWEL RESECTION    . BREAST LUMPECTOMY    . LEAD REVISION  09/17/11   s/p atrial lead revision  . PACEMAKER INSERTION     MDT implanted at [facility]    OB History    No data available       Home Medications    Prior to Admission medications   Medication Sig Start Date End Date Taking? Authorizing Provider  acetaminophen-codeine (TYLENOL #3) 300-30 MG tablet Take 1 tablet by mouth every 6 (six) hours as needed for moderate pain. 07/25/16   Truitt Merle, MD  ALPRAZolam Duanne Moron) 0.25  MG tablet Take 1 tablet (0.25 mg total) by mouth 2 (two) times daily as needed for anxiety or sleep. 07/01/16   Truitt Merle, MD  amoxicillin-clavulanate (AUGMENTIN) 875-125 MG tablet Take 1 tablet by mouth 2 (two) times daily. 05/20/16   Truitt Merle, MD  diclofenac sodium (VOLTAREN) 1 % GEL Apply 1 application topically 3 (three) times daily as needed (pain).    Historical Provider, MD  ergocalciferol (VITAMIN D2) 50000 units capsule TAKE 1 CAPSULE BY MOUTH EVERY 14 DAYS    Historical Provider, MD  famotidine (PEPCID) 20 MG tablet Take 1 tablet (20 mg total) by mouth 2 (two) times daily. 06/08/13   Ivan Anchors Love, PA-C  haloperidol (HALDOL) 2 MG tablet Take 2 mg by mouth every 8  (eight) hours as needed for agitation.    Historical Provider, MD  levETIRAcetam (KEPPRA) 500 MG tablet TAKE 1 TABLET TWICE A DAY 02/18/16   Meredith Staggers, MD  metoprolol tartrate (LOPRESSOR) 25 MG tablet Take 1 tablet (25 mg total) by mouth 2 (two) times daily. Patient taking differently: Take 12.5 mg by mouth 2 (two) times daily.  05/23/15   Meredith Staggers, MD  nitrofurantoin (MACRODANTIN) 25 MG capsule Take by mouth 2 (two) times daily.    Historical Provider, MD  prochlorperazine (COMPAZINE) 5 MG tablet Take 1 tablet (5 mg total) by mouth every 6 (six) hours as needed for nausea or vomiting. 07/01/16   Truitt Merle, MD  senna (SENOKOT) 8.6 MG TABS tablet Take 2 tablets (17.2 mg total) by mouth at bedtime. 06/08/13   Bary Leriche, PA-C  sertraline (ZOLOFT) 50 MG tablet TAKE 50 MG BY MOUTH AT BEDTIME 05/23/15   Meredith Staggers, MD  vitamin B-12 (CYANOCOBALAMIN) 1000 MCG tablet Take 1 tablet (1,000 mcg total) by mouth every evening. 05/23/15   Meredith Staggers, MD    Family History Family History  Problem Relation Age of Onset  . Stroke Father   . Aneurysm Mother   . Other      negative for premature CAD.  Marland Kitchen Cancer Sister 9    esophageal cancer   . Cancer Other     nephew had lung and kidney cancer     Social History Social History  Substance Use Topics  . Smoking status: Former Smoker    Packs/day: 0.25    Years: 30.00    Quit date: 11/26/1968  . Smokeless tobacco: Never Used  . Alcohol use Yes     Comment: wine on occasionally     Allergies   Patient has no known allergies.   Review of Systems Review of Systems Unable to obtain due to dementia  Physical Exam Updated Vital Signs BP (!) 116/58 (BP Location: Right Arm)   Pulse 62   Resp (!) 24   SpO2 100%   Physical Exam Physical Exam  Nursing note and vitals reviewed. Constitutional: thin, frail appearing elderly woman, non-toxic, and in no acute distress Head: Normocephalic and atraumatic. Old bruise over left  cheek Mouth/Throat: Oropharynx is clear and dry  Neck: Normal range of motion. Neck supple. no cervical spine tenderness Cardiovascular: Normal rate and regular rhythm.   Pulmonary/Chest: Effort normal and breath sounds normal. no chest wall tenderness Abdominal: Soft. There is no tenderness. There is no rebound and no guarding.  Musculoskeletal: Normal range of motion of all 4 extremities. No obvious deformities. Bruising and scab of various stages over arms and legs.  Neurological: Alert, no facial droop, moves all extremities symmetrically,  bilateral hand grip normal strength, obeys simple commands Skin: Skin is warm and dry.  Psychiatric: Cooperative   ED Treatments / Results  Labs (all labs ordered are listed, but only abnormal results are displayed) Labs Reviewed - No data to display  EKG  EKG Interpretation None       Radiology Ct Head Wo Contrast  Result Date: 09/07/2016 CLINICAL DATA:  Head injury status post fall. EXAM: CT HEAD WITHOUT CONTRAST CT CERVICAL SPINE WITHOUT CONTRAST TECHNIQUE: Multidetector CT imaging of the head and cervical spine was performed following the standard protocol without intravenous contrast. Multiplanar CT image reconstructions of the cervical spine were also generated. COMPARISON:  12/03/2014 FINDINGS: CT HEAD FINDINGS Brain: No evidence of acute infarction, hemorrhage, hydrocephalus, extra-axial collection or mass lesion/mass effect. Chronic encephalomalacia in the inferior left frontal lobe is unchanged, with mild associated ex vacuo dilatation of the left frontal horn of the third ventricle. Stable brain parenchymal volume loss and periventricular white matter hypodensities, usually associated with chronic small vessel ischemic disease. Vascular: No hyperdense vessel or unexpected calcification. Skull: Normal. Negative for fracture or focal lesion. Sinuses/Orbits: No acute finding. Other: Prior bilateral cataract extraction. CT CERVICAL SPINE  FINDINGS Alignment: Minimal anterolisthesis of C4 on C5 and posterior listhesis of C5 on C6, and anterolisthesis of C6 on C7, likely degenerative. Skull base and vertebrae: No acute fracture. No primary bone lesion or focal pathologic process. Soft tissues and spinal canal: No prevertebral fluid or swelling. No visible canal hematoma. Disc levels: Multilevel osteoarthritic changes with disc space narrowing, mild vertebral body remodeling and osteophyte formation. Associated moderate to severe posterior facet arthropathy. Upper chest: Negative. Other: None. IMPRESSION: No acute intracranial abnormality. Stable brain parenchymal atrophy and chronic microvascular disease. No evidence of acute injury to the cervical spine. Multilevel osteoarthritic changes with associated mild alignment abnormalities, likely due to severe posterior facet arthropathy. Electronically Signed   By: Fidela Salisbury M.D.   On: 09/07/2016 14:49   Ct Cervical Spine Wo Contrast  Result Date: 09/07/2016 CLINICAL DATA:  Head injury status post fall. EXAM: CT HEAD WITHOUT CONTRAST CT CERVICAL SPINE WITHOUT CONTRAST TECHNIQUE: Multidetector CT imaging of the head and cervical spine was performed following the standard protocol without intravenous contrast. Multiplanar CT image reconstructions of the cervical spine were also generated. COMPARISON:  12/03/2014 FINDINGS: CT HEAD FINDINGS Brain: No evidence of acute infarction, hemorrhage, hydrocephalus, extra-axial collection or mass lesion/mass effect. Chronic encephalomalacia in the inferior left frontal lobe is unchanged, with mild associated ex vacuo dilatation of the left frontal horn of the third ventricle. Stable brain parenchymal volume loss and periventricular white matter hypodensities, usually associated with chronic small vessel ischemic disease. Vascular: No hyperdense vessel or unexpected calcification. Skull: Normal. Negative for fracture or focal lesion. Sinuses/Orbits: No acute  finding. Other: Prior bilateral cataract extraction. CT CERVICAL SPINE FINDINGS Alignment: Minimal anterolisthesis of C4 on C5 and posterior listhesis of C5 on C6, and anterolisthesis of C6 on C7, likely degenerative. Skull base and vertebrae: No acute fracture. No primary bone lesion or focal pathologic process. Soft tissues and spinal canal: No prevertebral fluid or swelling. No visible canal hematoma. Disc levels: Multilevel osteoarthritic changes with disc space narrowing, mild vertebral body remodeling and osteophyte formation. Associated moderate to severe posterior facet arthropathy. Upper chest: Negative. Other: None. IMPRESSION: No acute intracranial abnormality. Stable brain parenchymal atrophy and chronic microvascular disease. No evidence of acute injury to the cervical spine. Multilevel osteoarthritic changes with associated mild alignment abnormalities, likely due to severe  posterior facet arthropathy. Electronically Signed   By: Fidela Salisbury M.D.   On: 09/07/2016 14:49   Dg Hip Unilat W Or Wo Pelvis 2-3 Views Right  Result Date: 09/07/2016 CLINICAL DATA:  83 year old female with history of right-sided hip pain after a fall. EXAM: DG HIP (WITH OR WITHOUT PELVIS) 2-3V RIGHT COMPARISON:  06/19/2016. FINDINGS: There is no evidence of hip fracture or dislocation. Mild bilateral hip joint osteoarthritis. There is no other focal bone abnormality. IMPRESSION: 1. No acute radiographic abnormality of the bony pelvis or right hip. Electronically Signed   By: Vinnie Langton M.D.   On: 09/07/2016 15:00    Procedures Procedures (including critical care time)  Medications Ordered in ED Medications - No data to display   Initial Impression / Assessment and Plan / ED Course  I have reviewed the triage vital signs and the nursing notes.  Pertinent labs & imaging results that were available during my care of the patient were reviewed by me and considered in my medical decision making (see chart  for details).     83 year old female who presents after fall from wheelchair. Seems like baseline mental status. Vitals stable. No focal neuro deficits. No obvious s/s of injury, but multiple old bruises from prior falls. EMS reports hip pain on right, but she has normal ROM and no deformity/tenderness. XR hip visualized and w/o fracture. Ct head and c-spine visualized and negative for injury. No other injuries noted on exam. Felt stable for discharge back to facility.   Final Clinical Impressions(s) / ED Diagnoses   Final diagnoses:  Fall, initial encounter    New Prescriptions New Prescriptions   No medications on file     Forde Dandy, MD 09/07/16 1529

## 2016-09-07 NOTE — Discharge Instructions (Signed)
Your X-rays and CTs does not show serious injury of head/neck or hip.   Return without fail for worsening symptoms, including fever, confusion, worsening pain or any other symptoms concerning to you

## 2016-09-07 NOTE — ED Triage Notes (Signed)
Pt coming from [facility] with a fall by ems. Pt was sitting eating lunch slide out of wheelchair. Pt c/o right side hip pain. Pt alert to her self only. Pt have hx dementia. Fall was witness by nursing staff and they state pt is not on any blood thinners.

## 2016-09-07 NOTE — ED Notes (Signed)
Bed: [facility] Expected date:  Expected time:  Means of arrival:  Comments: Fall from assisted living

## 2016-09-07 NOTE — ED Notes (Signed)
Morning view call and made aware of pt returning to the facility.

## 2016-09-07 NOTE — ED Notes (Signed)
PTAR already be call for pt transportation back to [facility].

## 2016-09-07 NOTE — ED Notes (Signed)
Patient transported to CT 

## 2016-09-08 ENCOUNTER — Ambulatory Visit (HOSPITAL_BASED_OUTPATIENT_CLINIC_OR_DEPARTMENT_OTHER): Payer: Medicare Other | Admitting: Hematology

## 2016-09-08 VITALS — BP 140/67 | HR 71 | Temp 98.2°F | Resp 17 | Ht 64.0 in

## 2016-09-08 DIAGNOSIS — I4891 Unspecified atrial fibrillation: Secondary | ICD-10-CM

## 2016-09-08 DIAGNOSIS — Z8673 Personal history of transient ischemic attack (TIA), and cerebral infarction without residual deficits: Secondary | ICD-10-CM

## 2016-09-08 DIAGNOSIS — R110 Nausea: Secondary | ICD-10-CM

## 2016-09-08 DIAGNOSIS — I951 Orthostatic hypotension: Secondary | ICD-10-CM

## 2016-09-08 DIAGNOSIS — R630 Anorexia: Secondary | ICD-10-CM

## 2016-09-08 DIAGNOSIS — C251 Malignant neoplasm of body of pancreas: Secondary | ICD-10-CM | POA: Diagnosis not present

## 2016-09-08 DIAGNOSIS — R1013 Epigastric pain: Secondary | ICD-10-CM

## 2016-09-08 DIAGNOSIS — F0390 Unspecified dementia without behavioral disturbance: Secondary | ICD-10-CM

## 2016-09-08 DIAGNOSIS — Z9181 History of falling: Secondary | ICD-10-CM | POA: Diagnosis not present

## 2016-09-09 ENCOUNTER — Encounter: Payer: Self-pay | Admitting: Hematology

## 2016-09-09 DIAGNOSIS — F09 Unspecified mental disorder due to known physiological condition: Secondary | ICD-10-CM | POA: Diagnosis not present

## 2016-09-09 DIAGNOSIS — S81802A Unspecified open wound, left lower leg, initial encounter: Secondary | ICD-10-CM | POA: Diagnosis not present

## 2016-09-09 DIAGNOSIS — I4891 Unspecified atrial fibrillation: Secondary | ICD-10-CM | POA: Diagnosis not present

## 2016-09-09 DIAGNOSIS — I10 Essential (primary) hypertension: Secondary | ICD-10-CM | POA: Diagnosis not present

## 2016-09-09 DIAGNOSIS — G3184 Mild cognitive impairment, so stated: Secondary | ICD-10-CM | POA: Diagnosis not present

## 2016-09-09 DIAGNOSIS — K219 Gastro-esophageal reflux disease without esophagitis: Secondary | ICD-10-CM | POA: Diagnosis not present

## 2016-09-09 DIAGNOSIS — C259 Malignant neoplasm of pancreas, unspecified: Secondary | ICD-10-CM | POA: Diagnosis not present

## 2016-09-09 DIAGNOSIS — I6930 Unspecified sequelae of cerebral infarction: Secondary | ICD-10-CM | POA: Diagnosis not present

## 2016-09-10 ENCOUNTER — Telehealth: Payer: Self-pay | Admitting: *Deleted

## 2016-09-10 ENCOUNTER — Telehealth: Payer: Self-pay | Admitting: Hematology

## 2016-09-10 NOTE — Telephone Encounter (Signed)
"  Ulice Dash with [facility] calling for Dr. Ernestina Penna patient.  We have a form Dr. Burr Medico needs to complete.  Can you verify this is a patient, what is the fax number and how long will it take to complete.  We'd like forms returned within a week."  Provided POD fax number.  Will notify staff of this request.

## 2016-09-10 NOTE — Telephone Encounter (Signed)
Received LTD paperwork from [email or web address] to be completed

## 2016-09-11 NOTE — Telephone Encounter (Signed)
"  This is Ulice Dash calling to confirm receipt of form faxed to office for this patient."   Yes, Dr. Burr Medico documented receipt of fax.  No further questions.

## 2016-09-16 ENCOUNTER — Telehealth: Payer: Self-pay | Admitting: Hematology

## 2016-09-16 NOTE — Telephone Encounter (Signed)
Faxed disability paperwork to [email or web address] fax# [phone]

## 2016-09-18 DIAGNOSIS — F0391 Unspecified dementia with behavioral disturbance: Secondary | ICD-10-CM | POA: Diagnosis not present

## 2016-09-19 NOTE — Progress Notes (Signed)
Nutrition Assessment   Reason for Assessment:   Patient identified on Malnutrition Screening Report for poor appetite and weight loss and MD asked to call daughter for nutrition concerns.  ASSESSMENT:  83 year old female with pancreatic cancer.  Past medical history of afib, CHF, CVA, HTN, mild dementia. Patient currently lives in assisted living facility and was on hospice but daughter has revoked this so patient can received PT/OT  Spoke with daughter via phone this am.  Daughter reports patient with little appetite and must be prompted on encouraged to eat.  Daughter reports patient is currently not taking marinol. Concerned that patient is not getting enough protein to be able to participate in PT  Daughter reports no issues with nausea, has regular bowel movements, no trouble chewing or swallowing.    Nutrition Focused Physical Exam: deferred phone call  Medications: reviewed  Labs: reviewed  Anthropometrics:   Height: 64 inches Weight: 112 pounds UBW: 150 pounds in October 2017 BMI: 19  25% weight loss in the last 5 months, significant   NUTRITION DIAGNOSIS: Unintentional weight loss related to cancer and possibly dementia as evidenced by 25% weight loss in the last 5 months and eating < or equal to 75% of energy needs for > or equal to 1 month    MALNUTRITION DIAGNOSIS: Severe malnutrition in context of chronic illness (cancer and possibly mild dementia) as evidenced by 25% weight loss in last 5 months and eating < or equal to 75% of energy needs for > or equal to 1 month.   INTERVENTION:   Discussed strategies to increase calories and protein (examples of food items discussed). Daughter to stock patient refrigerator with these items.  Encouraged daughter to speak with facility regarding offering snacks during the day, encouraging patient to eat.    Patient does not really like oral nutrition supplement drinks.  Discussed if facility could make milkshake using  supplement drink or homemade milkshake for patient to consume for additional calories and protein.   Contact information provided to daughter and appreciative of information.  MONITORING, EVALUATION, GOAL: Patient to consume adequate calories and protein to maintain lean muscle mass   NEXT VISIT: as needed  Marien Manship B. Zenia Resides, [place], [place] Registered Dietitian [phone] (pager)

## 2016-09-27 DIAGNOSIS — R41841 Cognitive communication deficit: Secondary | ICD-10-CM | POA: Diagnosis not present

## 2016-09-27 DIAGNOSIS — F0390 Unspecified dementia without behavioral disturbance: Secondary | ICD-10-CM | POA: Diagnosis not present

## 2016-09-28 DIAGNOSIS — R41841 Cognitive communication deficit: Secondary | ICD-10-CM | POA: Diagnosis not present

## 2016-09-28 DIAGNOSIS — F0390 Unspecified dementia without behavioral disturbance: Secondary | ICD-10-CM | POA: Diagnosis not present

## 2016-09-29 DIAGNOSIS — R41841 Cognitive communication deficit: Secondary | ICD-10-CM | POA: Diagnosis not present

## 2016-09-29 DIAGNOSIS — F0390 Unspecified dementia without behavioral disturbance: Secondary | ICD-10-CM | POA: Diagnosis not present

## 2016-09-30 DIAGNOSIS — R41841 Cognitive communication deficit: Secondary | ICD-10-CM | POA: Diagnosis not present

## 2016-09-30 DIAGNOSIS — F0390 Unspecified dementia without behavioral disturbance: Secondary | ICD-10-CM | POA: Diagnosis not present

## 2016-10-01 DIAGNOSIS — R41841 Cognitive communication deficit: Secondary | ICD-10-CM | POA: Diagnosis not present

## 2016-10-01 DIAGNOSIS — I4891 Unspecified atrial fibrillation: Secondary | ICD-10-CM | POA: Diagnosis not present

## 2016-10-01 DIAGNOSIS — C259 Malignant neoplasm of pancreas, unspecified: Secondary | ICD-10-CM | POA: Diagnosis not present

## 2016-10-01 DIAGNOSIS — F0390 Unspecified dementia without behavioral disturbance: Secondary | ICD-10-CM | POA: Diagnosis not present

## 2016-10-01 DIAGNOSIS — R441 Visual hallucinations: Secondary | ICD-10-CM | POA: Diagnosis not present

## 2016-10-01 DIAGNOSIS — F0391 Unspecified dementia with behavioral disturbance: Secondary | ICD-10-CM | POA: Diagnosis not present

## 2016-10-02 DIAGNOSIS — R309 Painful micturition, unspecified: Secondary | ICD-10-CM | POA: Diagnosis not present

## 2016-10-07 DIAGNOSIS — I952 Hypotension due to drugs: Secondary | ICD-10-CM | POA: Diagnosis not present

## 2016-10-07 DIAGNOSIS — C259 Malignant neoplasm of pancreas, unspecified: Secondary | ICD-10-CM | POA: Diagnosis not present

## 2016-10-07 DIAGNOSIS — F0391 Unspecified dementia with behavioral disturbance: Secondary | ICD-10-CM | POA: Diagnosis not present

## 2016-10-07 DIAGNOSIS — F329 Major depressive disorder, single episode, unspecified: Secondary | ICD-10-CM | POA: Diagnosis not present

## 2016-10-07 DIAGNOSIS — F0390 Unspecified dementia without behavioral disturbance: Secondary | ICD-10-CM | POA: Diagnosis not present

## 2016-10-07 DIAGNOSIS — R41841 Cognitive communication deficit: Secondary | ICD-10-CM | POA: Diagnosis not present

## 2016-10-07 DIAGNOSIS — I10 Essential (primary) hypertension: Secondary | ICD-10-CM | POA: Diagnosis not present

## 2016-10-08 DIAGNOSIS — F0390 Unspecified dementia without behavioral disturbance: Secondary | ICD-10-CM | POA: Diagnosis not present

## 2016-10-08 DIAGNOSIS — R41841 Cognitive communication deficit: Secondary | ICD-10-CM | POA: Diagnosis not present

## 2016-10-09 DIAGNOSIS — F0390 Unspecified dementia without behavioral disturbance: Secondary | ICD-10-CM | POA: Diagnosis not present

## 2016-10-09 DIAGNOSIS — R41841 Cognitive communication deficit: Secondary | ICD-10-CM | POA: Diagnosis not present

## 2016-10-11 DIAGNOSIS — I10 Essential (primary) hypertension: Secondary | ICD-10-CM | POA: Diagnosis not present

## 2016-10-11 DIAGNOSIS — F419 Anxiety disorder, unspecified: Secondary | ICD-10-CM | POA: Diagnosis not present

## 2016-10-11 DIAGNOSIS — K219 Gastro-esophageal reflux disease without esophagitis: Secondary | ICD-10-CM | POA: Diagnosis not present

## 2016-10-11 DIAGNOSIS — F0391 Unspecified dementia with behavioral disturbance: Secondary | ICD-10-CM | POA: Diagnosis not present

## 2016-10-11 DIAGNOSIS — I482 Chronic atrial fibrillation: Secondary | ICD-10-CM | POA: Diagnosis not present

## 2016-10-11 DIAGNOSIS — E785 Hyperlipidemia, unspecified: Secondary | ICD-10-CM | POA: Diagnosis not present

## 2016-10-13 DIAGNOSIS — F0390 Unspecified dementia without behavioral disturbance: Secondary | ICD-10-CM | POA: Diagnosis not present

## 2016-10-13 DIAGNOSIS — R41841 Cognitive communication deficit: Secondary | ICD-10-CM | POA: Diagnosis not present

## 2016-10-14 DIAGNOSIS — F0390 Unspecified dementia without behavioral disturbance: Secondary | ICD-10-CM | POA: Diagnosis not present

## 2016-10-14 DIAGNOSIS — R41841 Cognitive communication deficit: Secondary | ICD-10-CM | POA: Diagnosis not present

## 2016-10-15 DIAGNOSIS — R41841 Cognitive communication deficit: Secondary | ICD-10-CM | POA: Diagnosis not present

## 2016-10-15 DIAGNOSIS — F0390 Unspecified dementia without behavioral disturbance: Secondary | ICD-10-CM | POA: Diagnosis not present

## 2016-10-16 DIAGNOSIS — F0390 Unspecified dementia without behavioral disturbance: Secondary | ICD-10-CM | POA: Diagnosis not present

## 2016-10-16 DIAGNOSIS — R41841 Cognitive communication deficit: Secondary | ICD-10-CM | POA: Diagnosis not present

## 2016-10-20 DIAGNOSIS — R41841 Cognitive communication deficit: Secondary | ICD-10-CM | POA: Diagnosis not present

## 2016-10-20 DIAGNOSIS — Z79899 Other long term (current) drug therapy: Secondary | ICD-10-CM | POA: Diagnosis not present

## 2016-10-20 DIAGNOSIS — F0390 Unspecified dementia without behavioral disturbance: Secondary | ICD-10-CM | POA: Diagnosis not present

## 2016-10-22 DIAGNOSIS — S81802A Unspecified open wound, left lower leg, initial encounter: Secondary | ICD-10-CM | POA: Diagnosis not present

## 2016-10-22 DIAGNOSIS — R269 Unspecified abnormalities of gait and mobility: Secondary | ICD-10-CM | POA: Diagnosis not present

## 2016-10-22 DIAGNOSIS — C259 Malignant neoplasm of pancreas, unspecified: Secondary | ICD-10-CM | POA: Diagnosis not present

## 2016-10-22 DIAGNOSIS — I4891 Unspecified atrial fibrillation: Secondary | ICD-10-CM | POA: Diagnosis not present

## 2016-10-22 DIAGNOSIS — Z9114 Patient's other noncompliance with medication regimen: Secondary | ICD-10-CM | POA: Diagnosis not present

## 2016-10-22 DIAGNOSIS — F0391 Unspecified dementia with behavioral disturbance: Secondary | ICD-10-CM | POA: Diagnosis not present

## 2016-10-22 DIAGNOSIS — R451 Restlessness and agitation: Secondary | ICD-10-CM | POA: Diagnosis not present

## 2016-10-22 DIAGNOSIS — F0390 Unspecified dementia without behavioral disturbance: Secondary | ICD-10-CM | POA: Diagnosis not present

## 2016-10-22 DIAGNOSIS — R41841 Cognitive communication deficit: Secondary | ICD-10-CM | POA: Diagnosis not present

## 2016-10-22 DIAGNOSIS — R609 Edema, unspecified: Secondary | ICD-10-CM | POA: Diagnosis not present

## 2016-10-24 DIAGNOSIS — R41841 Cognitive communication deficit: Secondary | ICD-10-CM | POA: Diagnosis not present

## 2016-10-24 DIAGNOSIS — F0390 Unspecified dementia without behavioral disturbance: Secondary | ICD-10-CM | POA: Diagnosis not present

## 2016-10-25 DIAGNOSIS — R41841 Cognitive communication deficit: Secondary | ICD-10-CM | POA: Diagnosis not present

## 2016-10-25 DIAGNOSIS — F0390 Unspecified dementia without behavioral disturbance: Secondary | ICD-10-CM | POA: Diagnosis not present

## 2016-10-28 DIAGNOSIS — F0390 Unspecified dementia without behavioral disturbance: Secondary | ICD-10-CM | POA: Diagnosis not present

## 2016-10-28 DIAGNOSIS — R41841 Cognitive communication deficit: Secondary | ICD-10-CM | POA: Diagnosis not present

## 2016-10-29 DIAGNOSIS — Z79899 Other long term (current) drug therapy: Secondary | ICD-10-CM | POA: Diagnosis not present

## 2016-10-31 DIAGNOSIS — R41841 Cognitive communication deficit: Secondary | ICD-10-CM | POA: Diagnosis not present

## 2016-10-31 DIAGNOSIS — F0390 Unspecified dementia without behavioral disturbance: Secondary | ICD-10-CM | POA: Diagnosis not present

## 2016-11-03 DIAGNOSIS — R41841 Cognitive communication deficit: Secondary | ICD-10-CM | POA: Diagnosis not present

## 2016-11-03 DIAGNOSIS — F0390 Unspecified dementia without behavioral disturbance: Secondary | ICD-10-CM | POA: Diagnosis not present

## 2016-11-04 DIAGNOSIS — C259 Malignant neoplasm of pancreas, unspecified: Secondary | ICD-10-CM | POA: Diagnosis not present

## 2016-11-04 DIAGNOSIS — F0390 Unspecified dementia without behavioral disturbance: Secondary | ICD-10-CM | POA: Diagnosis not present

## 2016-11-04 DIAGNOSIS — R451 Restlessness and agitation: Secondary | ICD-10-CM | POA: Diagnosis not present

## 2016-11-04 DIAGNOSIS — R05 Cough: Secondary | ICD-10-CM | POA: Diagnosis not present

## 2016-11-04 DIAGNOSIS — R41841 Cognitive communication deficit: Secondary | ICD-10-CM | POA: Diagnosis not present

## 2016-11-04 DIAGNOSIS — F0391 Unspecified dementia with behavioral disturbance: Secondary | ICD-10-CM | POA: Diagnosis not present

## 2016-11-04 DIAGNOSIS — N390 Urinary tract infection, site not specified: Secondary | ICD-10-CM | POA: Diagnosis not present

## 2016-11-05 DIAGNOSIS — R05 Cough: Secondary | ICD-10-CM | POA: Diagnosis not present

## 2016-11-06 DIAGNOSIS — R41841 Cognitive communication deficit: Secondary | ICD-10-CM | POA: Diagnosis not present

## 2016-11-06 DIAGNOSIS — F0390 Unspecified dementia without behavioral disturbance: Secondary | ICD-10-CM | POA: Diagnosis not present

## 2016-11-07 DIAGNOSIS — R41841 Cognitive communication deficit: Secondary | ICD-10-CM | POA: Diagnosis not present

## 2016-11-07 DIAGNOSIS — F0390 Unspecified dementia without behavioral disturbance: Secondary | ICD-10-CM | POA: Diagnosis not present

## 2016-11-10 ENCOUNTER — Other Ambulatory Visit: Payer: Self-pay | Admitting: Nurse Practitioner

## 2016-11-10 ENCOUNTER — Ambulatory Visit
Admission: RE | Admit: 2016-11-10 | Discharge: 2016-11-10 | Disposition: A | Discharge status: Home / Self Care | Payer: Medicare Other | Source: Ambulatory Visit | Attending: Nurse Practitioner | Admitting: Nurse Practitioner

## 2016-11-10 ENCOUNTER — Other Ambulatory Visit: Payer: Medicare Other

## 2016-11-10 DIAGNOSIS — F0390 Unspecified dementia without behavioral disturbance: Secondary | ICD-10-CM | POA: Diagnosis not present

## 2016-11-10 DIAGNOSIS — S0990XA Unspecified injury of head, initial encounter: Secondary | ICD-10-CM

## 2016-11-10 DIAGNOSIS — R41841 Cognitive communication deficit: Secondary | ICD-10-CM | POA: Diagnosis not present

## 2016-11-12 DIAGNOSIS — C259 Malignant neoplasm of pancreas, unspecified: Secondary | ICD-10-CM | POA: Diagnosis not present

## 2016-11-12 DIAGNOSIS — R451 Restlessness and agitation: Secondary | ICD-10-CM | POA: Diagnosis not present

## 2016-11-12 DIAGNOSIS — S0990XA Unspecified injury of head, initial encounter: Secondary | ICD-10-CM | POA: Diagnosis not present

## 2016-11-12 DIAGNOSIS — F0391 Unspecified dementia with behavioral disturbance: Secondary | ICD-10-CM | POA: Diagnosis not present

## 2016-11-12 DIAGNOSIS — S0100XA Unspecified open wound of scalp, initial encounter: Secondary | ICD-10-CM | POA: Diagnosis not present

## 2016-11-12 DIAGNOSIS — F09 Unspecified mental disorder due to known physiological condition: Secondary | ICD-10-CM | POA: Diagnosis not present

## 2016-11-13 DIAGNOSIS — F0390 Unspecified dementia without behavioral disturbance: Secondary | ICD-10-CM | POA: Diagnosis not present

## 2016-11-13 DIAGNOSIS — R41841 Cognitive communication deficit: Secondary | ICD-10-CM | POA: Diagnosis not present

## 2016-11-18 DIAGNOSIS — R41841 Cognitive communication deficit: Secondary | ICD-10-CM | POA: Diagnosis not present

## 2016-11-18 DIAGNOSIS — R197 Diarrhea, unspecified: Secondary | ICD-10-CM | POA: Diagnosis not present

## 2016-11-18 DIAGNOSIS — K644 Residual hemorrhoidal skin tags: Secondary | ICD-10-CM | POA: Diagnosis not present

## 2016-11-18 DIAGNOSIS — K625 Hemorrhage of anus and rectum: Secondary | ICD-10-CM | POA: Diagnosis not present

## 2016-11-18 DIAGNOSIS — F0390 Unspecified dementia without behavioral disturbance: Secondary | ICD-10-CM | POA: Diagnosis not present

## 2016-11-19 DIAGNOSIS — K645 Perianal venous thrombosis: Secondary | ICD-10-CM | POA: Diagnosis not present

## 2016-11-20 DIAGNOSIS — F333 Major depressive disorder, recurrent, severe with psychotic symptoms: Secondary | ICD-10-CM | POA: Diagnosis not present

## 2016-11-20 DIAGNOSIS — G8929 Other chronic pain: Secondary | ICD-10-CM | POA: Diagnosis not present

## 2016-11-20 DIAGNOSIS — I10 Essential (primary) hypertension: Secondary | ICD-10-CM | POA: Diagnosis not present

## 2016-11-20 DIAGNOSIS — F419 Anxiety disorder, unspecified: Secondary | ICD-10-CM | POA: Diagnosis not present

## 2016-11-20 DIAGNOSIS — R269 Unspecified abnormalities of gait and mobility: Secondary | ICD-10-CM | POA: Diagnosis not present

## 2016-11-20 DIAGNOSIS — F0391 Unspecified dementia with behavioral disturbance: Secondary | ICD-10-CM | POA: Diagnosis not present

## 2016-11-20 DIAGNOSIS — I482 Chronic atrial fibrillation: Secondary | ICD-10-CM | POA: Diagnosis not present

## 2016-11-24 DIAGNOSIS — R41841 Cognitive communication deficit: Secondary | ICD-10-CM | POA: Diagnosis not present

## 2016-11-24 DIAGNOSIS — F0390 Unspecified dementia without behavioral disturbance: Secondary | ICD-10-CM | POA: Diagnosis not present

## 2016-12-02 DIAGNOSIS — R441 Visual hallucinations: Secondary | ICD-10-CM | POA: Diagnosis not present

## 2016-12-02 DIAGNOSIS — I10 Essential (primary) hypertension: Secondary | ICD-10-CM | POA: Diagnosis not present

## 2016-12-02 DIAGNOSIS — K644 Residual hemorrhoidal skin tags: Secondary | ICD-10-CM | POA: Diagnosis not present

## 2016-12-02 DIAGNOSIS — R451 Restlessness and agitation: Secondary | ICD-10-CM | POA: Diagnosis not present

## 2016-12-02 DIAGNOSIS — F0391 Unspecified dementia with behavioral disturbance: Secondary | ICD-10-CM | POA: Diagnosis not present

## 2016-12-02 DIAGNOSIS — F09 Unspecified mental disorder due to known physiological condition: Secondary | ICD-10-CM | POA: Diagnosis not present

## 2016-12-04 ENCOUNTER — Encounter: Payer: Medicare Other | Admitting: *Deleted

## 2016-12-04 ENCOUNTER — Telehealth: Payer: Self-pay | Admitting: Cardiology

## 2016-12-04 NOTE — Telephone Encounter (Signed)
Confirmed remote transmission w/ pt daughter.   

## 2016-12-05 ENCOUNTER — Encounter: Payer: Self-pay | Admitting: Cardiology

## 2016-12-05 ENCOUNTER — Encounter: Payer: Self-pay | Admitting: Hematology

## 2016-12-05 ENCOUNTER — Encounter: Payer: Self-pay | Admitting: Internal Medicine

## 2016-12-09 DIAGNOSIS — Z79899 Other long term (current) drug therapy: Secondary | ICD-10-CM | POA: Diagnosis not present

## 2016-12-11 DIAGNOSIS — I482 Chronic atrial fibrillation: Secondary | ICD-10-CM | POA: Diagnosis not present

## 2016-12-11 DIAGNOSIS — I10 Essential (primary) hypertension: Secondary | ICD-10-CM | POA: Diagnosis not present

## 2016-12-11 DIAGNOSIS — G8929 Other chronic pain: Secondary | ICD-10-CM | POA: Diagnosis not present

## 2016-12-11 DIAGNOSIS — I952 Hypotension due to drugs: Secondary | ICD-10-CM | POA: Diagnosis not present

## 2016-12-11 DIAGNOSIS — K219 Gastro-esophageal reflux disease without esophagitis: Secondary | ICD-10-CM | POA: Diagnosis not present

## 2016-12-11 DIAGNOSIS — F419 Anxiety disorder, unspecified: Secondary | ICD-10-CM | POA: Diagnosis not present

## 2016-12-11 DIAGNOSIS — R269 Unspecified abnormalities of gait and mobility: Secondary | ICD-10-CM | POA: Diagnosis not present

## 2016-12-12 ENCOUNTER — Encounter: Payer: Self-pay | Admitting: Internal Medicine

## 2016-12-12 NOTE — Telephone Encounter (Signed)
800# given to patient's daughter.

## 2016-12-13 DIAGNOSIS — F333 Major depressive disorder, recurrent, severe with psychotic symptoms: Secondary | ICD-10-CM | POA: Diagnosis not present

## 2016-12-13 DIAGNOSIS — Z9114 Patient's other noncompliance with medication regimen: Secondary | ICD-10-CM | POA: Diagnosis not present

## 2016-12-13 DIAGNOSIS — I482 Chronic atrial fibrillation: Secondary | ICD-10-CM | POA: Diagnosis not present

## 2016-12-13 DIAGNOSIS — F419 Anxiety disorder, unspecified: Secondary | ICD-10-CM | POA: Diagnosis not present

## 2016-12-13 DIAGNOSIS — R41841 Cognitive communication deficit: Secondary | ICD-10-CM | POA: Diagnosis not present

## 2016-12-25 ENCOUNTER — Emergency Department (HOSPITAL_COMMUNITY): Payer: Medicare Other

## 2016-12-25 ENCOUNTER — Encounter (HOSPITAL_COMMUNITY): Payer: Self-pay | Admitting: Emergency Medicine

## 2016-12-25 ENCOUNTER — Ambulatory Visit (INDEPENDENT_AMBULATORY_CARE_PROVIDER_SITE_OTHER): Payer: Medicare Other | Admitting: *Deleted

## 2016-12-25 ENCOUNTER — Emergency Department (HOSPITAL_COMMUNITY)
Admission: EM | Admit: 2016-12-25 | Discharge: 2016-12-25 | Disposition: A | Discharge status: Skilled Nursing Facility | Payer: Medicare Other | Attending: Emergency Medicine | Admitting: Emergency Medicine

## 2016-12-25 DIAGNOSIS — Z87891 Personal history of nicotine dependence: Secondary | ICD-10-CM | POA: Insufficient documentation

## 2016-12-25 DIAGNOSIS — R55 Syncope and collapse: Secondary | ICD-10-CM | POA: Diagnosis not present

## 2016-12-25 DIAGNOSIS — G40909 Epilepsy, unspecified, not intractable, without status epilepticus: Secondary | ICD-10-CM | POA: Diagnosis not present

## 2016-12-25 DIAGNOSIS — I69992 Facial weakness following unspecified cerebrovascular disease: Secondary | ICD-10-CM | POA: Diagnosis not present

## 2016-12-25 DIAGNOSIS — I495 Sick sinus syndrome: Secondary | ICD-10-CM

## 2016-12-25 DIAGNOSIS — Z950 Presence of cardiac pacemaker: Secondary | ICD-10-CM | POA: Insufficient documentation

## 2016-12-25 DIAGNOSIS — L231 Allergic contact dermatitis due to adhesives: Secondary | ICD-10-CM | POA: Insufficient documentation

## 2016-12-25 DIAGNOSIS — F0390 Unspecified dementia without behavioral disturbance: Secondary | ICD-10-CM | POA: Diagnosis not present

## 2016-12-25 DIAGNOSIS — I110 Hypertensive heart disease with heart failure: Secondary | ICD-10-CM | POA: Insufficient documentation

## 2016-12-25 DIAGNOSIS — I5032 Chronic diastolic (congestive) heart failure: Secondary | ICD-10-CM | POA: Diagnosis not present

## 2016-12-25 DIAGNOSIS — R569 Unspecified convulsions: Secondary | ICD-10-CM | POA: Insufficient documentation

## 2016-12-25 DIAGNOSIS — C259 Malignant neoplasm of pancreas, unspecified: Secondary | ICD-10-CM | POA: Insufficient documentation

## 2016-12-25 DIAGNOSIS — R4182 Altered mental status, unspecified: Secondary | ICD-10-CM | POA: Diagnosis not present

## 2016-12-25 DIAGNOSIS — I6789 Other cerebrovascular disease: Secondary | ICD-10-CM | POA: Diagnosis not present

## 2016-12-25 DIAGNOSIS — Z8673 Personal history of transient ischemic attack (TIA), and cerebral infarction without residual deficits: Secondary | ICD-10-CM | POA: Diagnosis not present

## 2016-12-25 DIAGNOSIS — Z79899 Other long term (current) drug therapy: Secondary | ICD-10-CM | POA: Insufficient documentation

## 2016-12-25 DIAGNOSIS — R29818 Other symptoms and signs involving the nervous system: Secondary | ICD-10-CM | POA: Diagnosis not present

## 2016-12-25 LAB — COMPREHENSIVE METABOLIC PANEL
ALBUMIN: 2.3 g/dL — AB (ref 3.5–5.0)
ALT: 47 U/L (ref 14–54)
ANION GAP: 7 (ref 5–15)
AST: 104 U/L — ABNORMAL HIGH (ref 15–41)
Alkaline Phosphatase: 674 U/L — ABNORMAL HIGH (ref 38–126)
BUN: 17 mg/dL (ref 6–20)
CHLORIDE: 101 mmol/L (ref 101–111)
CO2: 27 mmol/L (ref 22–32)
Calcium: 8.5 mg/dL — ABNORMAL LOW (ref 8.9–10.3)
Creatinine, Ser: 0.82 mg/dL (ref 0.44–1.00)
GFR calc non Af Amer: >60 mL/min (ref >60)
GLUCOSE: 88 mg/dL (ref 65–99)
POTASSIUM: 5.1 mmol/L (ref 3.5–5.1)
Sodium: 135 mmol/L (ref 135–145)
Total Bilirubin: 1.3 mg/dL — ABNORMAL HIGH (ref 0.3–1.2)
Total Protein: 5.8 g/dL — ABNORMAL LOW (ref 6.5–8.1)

## 2016-12-25 LAB — I-STAT CHEM 8, ED
BUN: 19 mg/dL (ref 6–20)
Calcium, Ion: 1.12 mmol/L — ABNORMAL LOW (ref 1.15–1.40)
Chloride: 99 mmol/L — ABNORMAL LOW (ref 101–111)
Creatinine, Ser: 0.8 mg/dL (ref 0.44–1.00)
GLUCOSE: 87 mg/dL (ref 65–99)
HCT: 33 % — ABNORMAL LOW (ref 36.0–46.0)
HEMOGLOBIN: 11.2 g/dL — AB (ref 12.0–15.0)
POTASSIUM: 4.6 mmol/L (ref 3.5–5.1)
SODIUM: 138 mmol/L (ref 135–145)
TCO2: 28 mmol/L (ref 0–100)

## 2016-12-25 LAB — URINALYSIS, ROUTINE W REFLEX MICROSCOPIC
BACTERIA UA: NONE SEEN
BILIRUBIN URINE: NEGATIVE
Glucose, UA: NEGATIVE mg/dL
Ketones, ur: NEGATIVE mg/dL
Leukocytes, UA: NEGATIVE
NITRITE: NEGATIVE
Protein, ur: NEGATIVE mg/dL
SPECIFIC GRAVITY, URINE: 1.01 (ref 1.005–1.030)
Squamous Epithelial / LPF: NONE SEEN
pH: 7 (ref 5.0–8.0)

## 2016-12-25 LAB — CBC WITH DIFFERENTIAL/PLATELET
Basophils Absolute: 0 10*3/uL (ref 0.0–0.1)
Basophils Relative: 0 %
Eosinophils Absolute: 0.1 10*3/uL (ref 0.0–0.7)
Eosinophils Relative: 1 %
HEMATOCRIT: 32.7 % — AB (ref 36.0–46.0)
HEMOGLOBIN: 10.4 g/dL — AB (ref 12.0–15.0)
LYMPHS ABS: 1.7 10*3/uL (ref 0.7–4.0)
LYMPHS PCT: 29 %
MCH: 33 pg (ref 26.0–34.0)
MCHC: 31.8 g/dL (ref 30.0–36.0)
MCV: 103.8 fL — ABNORMAL HIGH (ref 78.0–100.0)
MONOS PCT: 12 %
Monocytes Absolute: 0.7 10*3/uL (ref 0.1–1.0)
NEUTROS ABS: 3.4 10*3/uL (ref 1.7–7.7)
NEUTROS PCT: 58 %
Platelets: 87 10*3/uL — ABNORMAL LOW (ref 150–400)
RBC: 3.15 MIL/uL — ABNORMAL LOW (ref 3.87–5.11)
RDW: 15.5 % (ref 11.5–15.5)
WBC: 5.8 10*3/uL (ref 4.0–10.5)

## 2016-12-25 LAB — VALPROIC ACID LEVEL: VALPROIC ACID LVL: 16 ug/mL — AB (ref 50.0–100.0)

## 2016-12-25 LAB — CBG MONITORING, ED
GLUCOSE-CAPILLARY: 63 mg/dL — AB (ref 65–99)
GLUCOSE-CAPILLARY: 71 mg/dL (ref 65–99)

## 2016-12-25 LAB — I-STAT TROPONIN, ED: Troponin i, poc: 0.01 ng/mL (ref 0.00–0.08)

## 2016-12-25 LAB — APTT: APTT: 26 s (ref 24–36)

## 2016-12-25 LAB — I-STAT CG4 LACTIC ACID, ED: Lactic Acid, Venous: 1.62 mmol/L (ref 0.5–1.9)

## 2016-12-25 LAB — PROTIME-INR
INR: 1.04
PROTHROMBIN TIME: 13.6 s (ref 11.4–15.2)

## 2016-12-25 MED ORDER — DIVALPROEX SODIUM 125 MG PO CSDR: 500.0000 mg | DELAYED_RELEASE_CAPSULE | Freq: Two times a day (BID) | ORAL | 0 refills | Status: AC

## 2016-12-25 MED ORDER — VALPROATE SODIUM 500 MG/5ML IV SOLN
1000.0000 mg | Freq: Once | INTRAVENOUS | Status: AC
  Administered 2016-12-25: 1000 mg via INTRAVENOUS
  Filled 2016-12-25: qty 10

## 2016-12-25 MED ORDER — DIVALPROEX SODIUM 250 MG PO DR TAB
500.0000 mg | DELAYED_RELEASE_TABLET | Freq: Two times a day (BID) | ORAL | Status: DC
  Administered 2016-12-25: 500 mg via ORAL
  Filled 2016-12-25: qty 2

## 2016-12-25 NOTE — ED Notes (Signed)
Attempted IV without success.

## 2016-12-25 NOTE — Consult Note (Signed)
Neurology Consultation Reason for Consult: Unresponsiveness Referring Physician: Evlyn Courier  CC: Unresponsiveness  History is obtained from: Staff at the nursing home, daughter  HPI: Ann Kaufman is a 83 y.o. female with a history of atrial fibrillation, cranial hemorrhage, dementia who presents with abrupt loss of consciousness earlier today. I spoke with a caregiver who is with her at the time. She was complaining of abdominal pain, and the caregiver asked the nurse to bring her something for pain. She was apparently normal and speaking when she suddenly "fell asleep." There is no shaking, jerking, abnormal vocalizations, or other evidence of seizure activity. By the time the nurse brought back medications, they tried to rouse her and she was unarousable.  There is question of low blood pressure at the nursing home, but the by the time fire arrived her blood pressure was 867 systolic.  En route, she began to become slightly more responsive and regained consciousness in our emergency department.  Of note, the patient has been enrolled in hospice due to pancreatic cancer. Due to this(and I suspect due to agitation), her previous Keppra was stopped. Her daughter notes that she has intermittent staring spells that last for 30 seconds to a minute consisting of abrupt behavioral arrest and are followed by confusion. She is not certain how often they're happening now, but they have been happening. She was recent restarted on Depakote for behavioral control.   ROS:  Unable to obtain due to altered mental status.   Past Medical History:  Diagnosis Date  . Aphasia    Occasional aphasia & facial numbness from a left frontal lobe intercerebral hemorrhage on Xarelto on 05/18/13.  . Arthritis   . Atrial fibrillation ([place]) 2012   a. Dx in 2012;  b. Rhythm controlled - amiodarone, previously anticoagulated with xarelto (d/c'd 04/2013 in setting of [place]);  b. 04/2013 Echo: EF 55-60%, Gr 1 DD, mild MR, mod  dil LA, PAsP 56mmhg  . Depression   . Headache   . Hyperlipidemia   . Hypertension   . Intracerebral hemorrhage ([place])    a. 04/2013 in setting of xarelto therapy.  . Mitral valve prolapse   . Pacemaker   . PAF (paroxysmal atrial fibrillation) ([place])   . Palpitations    Occasional.  . Pericardial effusion   . Peripheral edema    Because she has been standing on her feet more than usual   . Presence of permanent cardiac pacemaker    a. 2012 MDT, placed in [place] (probable tachy-brady).  . Rheumatoid arthritis ([place])   . SOB (shortness of breath)    Occasional SOB  . SSS (sick sinus syndrome) (HCC)    s/p dual chamber pacemaker  . Stroke [facility])      Family History  Problem Relation Age of Onset  . Stroke Father   . Aneurysm Mother   . Other Unknown        negative for premature CAD.  Marland Kitchen Cancer Sister 20       esophageal cancer   . Cancer Other        nephew had lung and kidney cancer      Social History:  reports that she quit smoking about 48 years ago. She has a 7.50 pack-year smoking history. She has never used smokeless tobacco. She reports that she drinks alcohol. She reports that she uses drugs, including Other-see comments.   Exam: Current vital signs: BP 109/72   Pulse (!) 59   Temp (!) 96.7 F (35.9  C) (Rectal)   Resp 14   Wt 47.6 kg (104 lb 15 oz)   SpO2 99%   BMI 18.01 kg/m  Vital signs in last 24 hours: Temp:  [96.7 F (35.9 C)] 96.7 F (35.9 C) (07/05 1821) Pulse Rate:  [59-64] 59 (07/05 1900) Resp:  [14-22] 14 (07/05 1900) BP: (109-144)/(64-74) 109/72 (07/05 1900) SpO2:  [99 %-100 %] 99 % (07/05 1900) Weight:  [47.6 kg (104 lb 15 oz)-49.7 kg (109 lb 9.1 oz)] 47.6 kg (104 lb 15 oz) (07/05 1811)   Physical Exam  Constitutional: Appears elderly Psych: Affect appropriate to situation Eyes: No scleral injection HENT: No OP obstrucion Head: Normocephalic.  Cardiovascular: Normal rate and regular rhythm.  Respiratory: Effort normal and breath  sounds normal to anterior ascultation GI: Soft.  No distension. There is no tenderness.  Skin: WDI  Neuro: Mental Status: Patient is initially obtunded, but becomes arousable over the course of my exam. Cranial Nerves: II: She lives to threat bilaterally Pupils are equal, round, and reactive to light.   III,IV, VI: Initially I had some concern for a right gaze preference, but once prompted she does cross midline to the left. V: Facial sensation is symmetric to temperature VII: Facial movement with right facial weakness VIII: hearing is intact to voice X: Uvula elevates symmetrically XI: Shoulder shrug is symmetric. XII: tongue is midline without atrophy or fasciculations.  Motor: She is able to hold all extremities out of bed without drift Sensory: She endorses symmetric sensation, she does not give consistent answers to double simultaneous stimulation Cerebellar: She does not perform  I have reviewed labs in epic and the results pertinent to this consultation are: CMP-elevated alkaline phosphatase  I have reviewed the images obtained: CT head-no acute findings  Impression: 83 year old female with syncope versus seizure. There was no clinical evidence of seizure activity, and cardiac causes of syncope need to be ruled out. Given that she does have episodes very concerning for partial seizures (staring spells) in the setting of previous intracranial hemorrhage I think that I would favor increasing her dose of Depakote.  Recommendations: 1) Depacon 1 g 1 2) increase Depakote to 500 mg twice a day 3) cardiac evaluation per emergency department.  Roland Rack, MD [facility] [phone]  If 7pm- 7am, please page neurology on call as listed in [place].

## 2016-12-25 NOTE — ED Provider Notes (Signed)
[facility] DEPT Provider Note   CSN: [id number] Arrival date & time: 12/25/16  1752   An emergency department physician performed an initial assessment on this suspected stroke patient at 29.  History   Chief Complaint Chief Complaint  Patient presents with  . Altered Mental Status    HPI Ann Kaufman is a 83 y.o. female.  HPI  Remainder of history, ROS, and physical exam limited due to patient's condition (AMS). Additional information was obtained from EMS and sNF.   Level V Caveat.  Patient is an 83 year old female with a history of A. fib, prior intraparenchymal hemorrhage, dementia who presented to the emergency department via EMS after sudden loss of consciousness. The caregiver reported that the patient apparently suddenly fell asleep. They did not note any seizure-like activity. The patient was hard to arouse at that time. EMS was contacted and noted that the patient's blood pressure was hemodynamically stable. In route she became more responsive and was able to follow commands.  In route she was initiated as a possible code stroke. Upon arrival to the emergency department and after discussion with skilled nursing facility, code stroke was reversed.  Past Medical History:  Diagnosis Date  . Aphasia    Occasional aphasia & facial numbness from a left frontal lobe intercerebral hemorrhage on Xarelto on 05/18/13.  . Arthritis   . Atrial fibrillation ([place]) 2012   a. Dx in 2012;  b. Rhythm controlled - amiodarone, previously anticoagulated with xarelto (d/c'd 04/2013 in setting of [place]);  b. 04/2013 Echo: EF 55-60%, Gr 1 DD, mild MR, mod dil LA, PAsP 70mmhg  . Depression   . Headache   . Hyperlipidemia   . Hypertension   . Intracerebral hemorrhage ([place])    a. 04/2013 in setting of xarelto therapy.  . Mitral valve prolapse   . Pacemaker   . PAF (paroxysmal atrial fibrillation) ([place])   . Palpitations    Occasional.  . Pericardial effusion   . Peripheral edema    Because she has been standing on her feet more than usual   . Presence of permanent cardiac pacemaker    a. 2012 MDT, placed in [place] (probable tachy-brady).  . Rheumatoid arthritis ([place])   . SOB (shortness of breath)    Occasional SOB  . SSS (sick sinus syndrome) (H[facility])    s/p dual chamber pacemaker  . Stroke [facility])     Patient Active Problem List   Diagnosis Date Noted  . Pancreatic cancer ([facility]) 03/25/2016  . Osteoarthritis of right knee 03/19/2015  . Trochanteric bursitis of both hips 03/19/2015  . Gait disorder 03/19/2015  . Hyperlipidemia 12/08/2014  . Acute right-sided weakness 12/02/2014  . Pulmonary HTN ([place]) 12/02/2014  . DOE (dyspnea on exertion) 12/02/2014  . Chronic diastolic heart failure, NYHA class 1 ([place]) 12/02/2014  . TIA (transient ischemic attack) 12/02/2014  . Cerebral infarction due to unspecified mechanism   . Myofascial muscle pain 03/20/2014  . Sick sinus syndrome ([place]) 10/19/2013  . Seizure disorder after stroke 07/24/2013  . Syncope 05/30/2013  . Pacemaker 05/30/2013  . Chronic anticoagulation 05/19/2013  . History of ICH (intracerebral hemorrhage) 05/18/2013  . Atrial fibrillation, chronic ([place]) 04/06/2013  . Atherosclerosis of aorta ([place]) 04/06/2013  . Pleural effusion 11/27/2011  . HTN (hypertension) 11/27/2011    Past Surgical History:  Procedure Laterality Date  . ABDOMINAL HYSTERECTOMY    . APPENDECTOMY    . BOWEL RESECTION    . BREAST LUMPECTOMY    . LEAD REVISION  09/17/11   s/p atrial lead revision  . PACEMAKER INSERTION     MDT implanted at [facility]    OB History    No data available       Home Medications    Prior to Admission medications   Medication Sig Start Date End Date Taking? Authorizing Provider  acetaminophen (MAPAP) 500 MG tablet Take 500 mg by mouth 3 (three) times daily.   Yes [provider]  acetaminophen-codeine (TYLENOL #3) 300-30 MG tablet Take 1 tablet by mouth every 6 (six) hours as needed for  moderate pain. Patient taking differently: Take 1 tablet by mouth every 6 (six) hours as needed (for pain). HOLD SCHEDULED DOSE OF TYLENOL IF THIS IS TAKEN 07/25/16  Yes Truitt Merle, MD  ALPRAZolam (NIRAVAM) 0.25 MG dissolvable tablet Take 0.25 mg by mouth 2 (two) times daily. AND MAY TAKE A BEDTIME DOSE OF 0.25 MG IF NEEDED FOR SLEEPLESSNESS OR ANXIETY   Yes [provider]  cephALEXin (KEFLEX) 500 MG capsule Take 500 mg by mouth every 12 (twelve) hours. For 14 days (starting on 12/14/16) 12/13/16  Yes [provider]  Cholecalciferol (VITAMIN D-3) 1000 units CAPS Take 1,000 Units by mouth daily.   Yes [provider]  divalproex (DEPAKOTE SPRINKLE) 125 MG capsule Take 125 mg by mouth 3 (three) times daily.   Yes [provider]  famotidine (PEPCID) 20 MG tablet Take 1 tablet (20 mg total) by mouth 2 (two) times daily. 06/08/13  Yes Love, Ivan Anchors, PA-C  haloperidol (HALDOL) 2 MG/ML solution Take 0.5 mLs by mouth every 8 (eight) hours as needed for agitation.  10/10/16  Yes [provider]  Melatonin 3 MG TABS Take 3 mg by mouth at bedtime.   Yes [provider]  memantine (NAMENDA) 5 MG tablet Take 5-10 mg by mouth See admin instructions. 5 mg in the morning and 10 mg at bedtime   Yes [provider]  metoprolol tartrate (LOPRESSOR) 25 MG tablet Take 1 tablet (25 mg total) by mouth 2 (two) times daily. Patient taking differently: Take 12.5 mg by mouth 2 (two) times daily. HOLD FOR A BLOOD PRESSURE READING OF 100/60 OR A HEART RATE <60 05/23/15  Yes Meredith Staggers, MD  mirtazapine (REMERON) 15 MG tablet Take 15 mg by mouth at bedtime. 12/17/16  Yes [provider]  Neomycin-Bacitracin-Polymyxin (TRIPLE ANTIBIOTIC) [phone] OINT Apply 1 application topically See admin instructions. APPLY TO ALL SKIN TEARS, THEN BANDAGE (CLEAN WITH WOUND CLEANSER FIRST)   Yes [provider]  PROCTOSOL HC 2.5 % rectal cream Apply 1  application topically 4 (four) times daily as needed (for irritation).  11/19/16  Yes [provider]  senna (SENOKOT) 8.6 MG TABS tablet Take 2 tablets (17.2 mg total) by mouth at bedtime. Patient taking differently: Take 2 tablets by mouth daily as needed (for constipation).  06/08/13  Yes Love, Ivan Anchors, PA-C  sertraline (ZOLOFT) 50 MG tablet TAKE 50 MG BY MOUTH AT BEDTIME Patient taking differently: Take 50 mg by mouth daily.  05/23/15  Yes Meredith Staggers, MD  ALPRAZolam Duanne Moron) 0.25 MG tablet Take 1 tablet (0.25 mg total) by mouth 2 (two) times daily as needed for anxiety or sleep. Patient not taking: Reported on 12/25/2016 07/01/16   Truitt Merle, MD  diclofenac sodium (VOLTAREN) 1 % GEL Apply 1 application topically 3 (three) times daily as needed (pain).    [provider]  ergocalciferol (VITAMIN D2) 50000 units capsule TAKE 1 CAPSULE  BY MOUTH EVERY 14 DAYS    [provider]  prochlorperazine (COMPAZINE) 5 MG tablet Take 1 tablet (5 mg total) by mouth every 6 (six) hours as needed for nausea or vomiting. Patient not taking: Reported on 12/25/2016 07/01/16   Truitt Merle, MD    Family History Family History  Problem Relation Age of Onset  . Stroke Father   . Aneurysm Mother   . Other Unknown        negative for premature CAD.  Marland Kitchen Cancer Sister 69       esophageal cancer   . Cancer Other        nephew had lung and kidney cancer     Social History Social History  Substance Use Topics  . Smoking status: Former Smoker    Packs/day: 0.25    Years: 30.00    Quit date: 11/26/1968  . Smokeless tobacco: Never Used  . Alcohol use Yes     Comment: wine on occasionally     Allergies   Tape   Review of Systems Review of Systems  Unable to perform ROS: Mental status change     Physical Exam Updated Vital Signs BP (!) 141/71   Pulse 61   Temp (!) 96.7 F (35.9 C) (Rectal)   Resp 17   Wt 47.6 kg (104 lb 15 oz)   SpO2 98%   BMI 18.01 kg/m   Physical  Exam  Constitutional: She appears well-developed and well-nourished. No distress.  HENT:  Head: Normocephalic and atraumatic.  Right Ear: External ear normal.  Left Ear: External ear normal.  Nose: Nose normal.  Eyes: Conjunctivae and EOM are normal. No scleral icterus.  Neck: Normal range of motion and phonation normal.  Cardiovascular: Normal rate, regular rhythm and normal heart sounds.   Pulmonary/Chest: Effort normal and breath sounds normal. No stridor. No respiratory distress.  Abdominal: She exhibits no distension.  Musculoskeletal: Normal range of motion. She exhibits no edema.  Neurological: She is disoriented.  Neuro exam deferred to Neurology  Skin: She is not diaphoretic.  Psychiatric: She has a normal mood and affect. Her behavior is normal.  Vitals reviewed.    ED Treatments / Results  Labs (all labs ordered are listed, but only abnormal results are displayed) Labs Reviewed  COMPREHENSIVE METABOLIC PANEL - Abnormal; Notable for the following:       Result Value   Calcium 8.5 (*)    Total Protein 5.8 (*)    Albumin 2.3 (*)    AST 104 (*)    Alkaline Phosphatase 674 (*)    Total Bilirubin 1.3 (*)    All other components within normal limits  CBC WITH DIFFERENTIAL/PLATELET - Abnormal; Notable for the following:    RBC 3.15 (*)    Hemoglobin 10.4 (*)    HCT 32.7 (*)    MCV 103.8 (*)    Platelets 87 (*)    All other components within normal limits  URINALYSIS, ROUTINE W REFLEX MICROSCOPIC - Abnormal; Notable for the following:    Hgb urine dipstick SMALL (*)    All other components within normal limits  VALPROIC ACID LEVEL - Abnormal; Notable for the following:    Valproic Acid Lvl 16 (*)    All other components within normal limits  CBG MONITORING, ED - Abnormal; Notable for the following:    Glucose-Capillary 63 (*)    All other components within normal limits  I-STAT CHEM 8, ED - Abnormal; Notable for the following:  Chloride 99 (*)    Calcium, Ion  1.12 (*)    Hemoglobin 11.2 (*)    HCT 33.0 (*)    All other components within normal limits  PROTIME-INR  APTT  I-STAT TROPOININ, ED  I-STAT CG4 LACTIC ACID, ED  CBG MONITORING, ED  I-STAT CG4 LACTIC ACID, ED    EKG  EKG Interpretation  Date/Time:  Thursday December 25 2016 18:19:44 EDT Ventricular Rate:  63 PR Interval:    QRS Duration: 96 QT Interval:  462 QTC Calculation: 473 R Axis:   -54 Text Interpretation:  Sinus rhythm Left anterior fascicular block Consider anterior infarct Repol abnrm suggests ischemia, anterolateral No significant change since last tracing Confirmed by Addison Lank [phone]) on 12/25/2016 7:14:52 PM       Radiology Ct Head Wo Contrast  Result Date: 12/25/2016 CLINICAL DATA:  Code stroke. RIGHT facial droop, decreased level consciousness. History of intracranial hemorrhage, atrial fibrillation, pacemaker, hypertension. EXAM: CT HEAD WITHOUT CONTRAST TECHNIQUE: Contiguous axial images were obtained from the base of the skull through the vertex without intravenous contrast. COMPARISON:  CT HEAD Nov 10, 2016 FINDINGS: BRAIN: No intraparenchymal hemorrhage, mass effect nor midline shift. No acute large vascular territory infarct. LEFT frontal lobe encephalomalacia with mild ex vacuo dilatation LEFT frontal horn of lateral ventricle. No hydrocephalus. Patchy supratentorial white matter hypodensities. Small LEFT cerebral peduncle compatible with wallerian degeneration. No abnormal extra-axial fluid collections. Fall LEFT posterior fossa arachnoid cyst without mass effect. VASCULAR:  Mild calcific atherosclerosis of the carotid siphons. SKULL: No skull fracture. No significant scalp soft tissue swelling. SINUSES/ORBITS: The mastoid air-cells and included paranasal sinuses are well-aerated.The included ocular globes and orbital contents are non-suspicious. OTHER: None. ASPECTS [facility] Stroke Program Early CT Score) - Ganglionic level infarction (caudate, lentiform nuclei,  internal capsule, insula, M1-M3 cortex): 7 - Supraganglionic infarction (M4-M6 cortex): 3 Total score (0-10 with 10 being normal): 10 IMPRESSION: 1. No acute intracranial process. 2. LEFT frontal lobe encephalomalacia appears posttraumatic. 3. Mild to moderate chronic small vessel ischemic disease. 4. ASPECTS is 10. Critical Value/emergent results were called by telephone at the time of interpretation on 12/25/2016 at 6:14 pm to Dr. Leonel Ramsay, Neurology, who verbally acknowledged these results. Electronically Signed   By: Elon Alas M.D.   On: 12/25/2016 18:17   Dg Chest Port 1 View  Result Date: 12/25/2016 CLINICAL DATA:  Altered level of consciousness. EXAM: PORTABLE CHEST 1 VIEW COMPARISON:  06/11/2016 FINDINGS: 1811 hours. Patient rotated to the right. Cardiopericardial silhouette is at upper limits of normal for size. Opacity in the medial right lung may be related to patient rotation, but right parahilar pneumonia not excluded. Left lung appears clear. Permanent pacemaker again noted. The visualized bony structures of the thorax are intact. IMPRESSION: Question right parahilar pneumonia on this rotated film. Electronically Signed   By: Misty Stanley M.D.   On: 12/25/2016 18:18    Procedures Procedures (including critical care time)  Medications Ordered in ED Medications  divalproex (DEPAKOTE) DR tablet 500 mg (not administered)  valproate (DEPACON) 1,000 mg in dextrose 5 % 50 mL IVPB (1,000 mg Intravenous New Bag/Given 12/25/16 1925)     Initial Impression / Assessment and Plan / ED Course  I have reviewed the triage vital signs and the nursing notes.  Pertinent labs & imaging results that were available during my care of the patient were reviewed by me and considered in my medical decision making (see chart for details).     Code stroke was reversed  upon arrival. It appears the patient had seizure and was postictal. The patient returned to her baseline during her stay in the  emergency department. Valproate level was subtherapeutic. CMP revealed elevated LFTs which is to be expected given the pancreatic cancer. Other labs are grossly reassuring. CT head negative for any acute process.  On reexamination patient had returned to her baseline mental status per the daughter. Abdomen was benign. I have low suspicion for acute cholecystitis or serious intra-abdominal inflammatory/infectious process.   Of note the patient was currently being treated for urinary tract infection with ciprofloxacin. UA without evidence of infection at this time.   Neurology recommended increasing the patient's Depakote to 500 mg twice a day.  The patient is safe for discharge with strict return precautions.  Final Clinical Impressions(s) / ED Diagnoses   Final diagnoses:  Altered mental state   Disposition: Discharge  Condition: Good  I have discussed the results, Dx and Tx plan with the patient's daugther who expressed understanding and agree(s) with the plan. Discharge instructions discussed at great length. The patient's daughter was given strict return precautions who verbalized understanding of the instructions. No further questions at time of discharge.    Current Discharge Medication List      Follow Up: [facility] [address] [place]  [id number] [phone]  Schedule an appointment as soon as possible for a visit        Kary Sugrue, Grayce Sessions, MD 12/25/16 2233

## 2016-12-25 NOTE — ED Triage Notes (Signed)
Per EMS, pt from [facility] [place] today. Pt has advanced dementia, staff noted decreased LOC, responsive to pain, with right sided facial droop. Upon arrival, pt alert to voice.

## 2016-12-25 NOTE — ED Notes (Signed)
Dr. Leonel Ramsay and Dr. Leonette Monarch notified pt's CBG 63

## 2016-12-30 DIAGNOSIS — G40909 Epilepsy, unspecified, not intractable, without status epilepticus: Secondary | ICD-10-CM | POA: Diagnosis not present

## 2016-12-30 DIAGNOSIS — I6930 Unspecified sequelae of cerebral infarction: Secondary | ICD-10-CM | POA: Diagnosis not present

## 2016-12-30 DIAGNOSIS — F0391 Unspecified dementia with behavioral disturbance: Secondary | ICD-10-CM | POA: Diagnosis not present

## 2017-01-06 DIAGNOSIS — I6930 Unspecified sequelae of cerebral infarction: Secondary | ICD-10-CM | POA: Diagnosis not present

## 2017-01-06 DIAGNOSIS — I4891 Unspecified atrial fibrillation: Secondary | ICD-10-CM | POA: Diagnosis not present

## 2017-01-06 DIAGNOSIS — F0391 Unspecified dementia with behavioral disturbance: Secondary | ICD-10-CM | POA: Diagnosis not present

## 2017-01-06 DIAGNOSIS — C259 Malignant neoplasm of pancreas, unspecified: Secondary | ICD-10-CM | POA: Diagnosis not present

## 2017-01-08 DIAGNOSIS — C259 Malignant neoplasm of pancreas, unspecified: Secondary | ICD-10-CM | POA: Diagnosis not present

## 2017-01-08 DIAGNOSIS — I495 Sick sinus syndrome: Secondary | ICD-10-CM | POA: Diagnosis not present

## 2017-01-08 DIAGNOSIS — I4891 Unspecified atrial fibrillation: Secondary | ICD-10-CM | POA: Diagnosis not present

## 2017-01-08 DIAGNOSIS — C7889 Secondary malignant neoplasm of other digestive organs: Secondary | ICD-10-CM | POA: Diagnosis not present

## 2017-01-08 DIAGNOSIS — F0150 Vascular dementia without behavioral disturbance: Secondary | ICD-10-CM | POA: Diagnosis not present

## 2017-01-08 DIAGNOSIS — I5030 Unspecified diastolic (congestive) heart failure: Secondary | ICD-10-CM | POA: Diagnosis not present

## 2017-01-08 DIAGNOSIS — R251 Tremor, unspecified: Secondary | ICD-10-CM | POA: Diagnosis not present

## 2017-01-08 DIAGNOSIS — I10 Essential (primary) hypertension: Secondary | ICD-10-CM | POA: Diagnosis not present

## 2017-01-08 DIAGNOSIS — R569 Unspecified convulsions: Secondary | ICD-10-CM | POA: Diagnosis not present

## 2017-01-08 DIAGNOSIS — I619 Nontraumatic intracerebral hemorrhage, unspecified: Secondary | ICD-10-CM | POA: Diagnosis not present

## 2017-01-08 DIAGNOSIS — E785 Hyperlipidemia, unspecified: Secondary | ICD-10-CM | POA: Diagnosis not present

## 2017-01-08 DIAGNOSIS — K219 Gastro-esophageal reflux disease without esophagitis: Secondary | ICD-10-CM | POA: Diagnosis not present

## 2017-01-08 DIAGNOSIS — F339 Major depressive disorder, recurrent, unspecified: Secondary | ICD-10-CM | POA: Diagnosis not present

## 2017-01-09 DIAGNOSIS — C7889 Secondary malignant neoplasm of other digestive organs: Secondary | ICD-10-CM | POA: Diagnosis not present

## 2017-01-09 DIAGNOSIS — I5030 Unspecified diastolic (congestive) heart failure: Secondary | ICD-10-CM | POA: Diagnosis not present

## 2017-01-09 DIAGNOSIS — I4891 Unspecified atrial fibrillation: Secondary | ICD-10-CM | POA: Diagnosis not present

## 2017-01-09 DIAGNOSIS — C259 Malignant neoplasm of pancreas, unspecified: Secondary | ICD-10-CM | POA: Diagnosis not present

## 2017-01-09 DIAGNOSIS — I495 Sick sinus syndrome: Secondary | ICD-10-CM | POA: Diagnosis not present

## 2017-01-09 DIAGNOSIS — I619 Nontraumatic intracerebral hemorrhage, unspecified: Secondary | ICD-10-CM | POA: Diagnosis not present

## 2017-01-12 DIAGNOSIS — C7889 Secondary malignant neoplasm of other digestive organs: Secondary | ICD-10-CM | POA: Diagnosis not present

## 2017-01-12 DIAGNOSIS — I619 Nontraumatic intracerebral hemorrhage, unspecified: Secondary | ICD-10-CM | POA: Diagnosis not present

## 2017-01-12 DIAGNOSIS — C259 Malignant neoplasm of pancreas, unspecified: Secondary | ICD-10-CM | POA: Diagnosis not present

## 2017-01-12 DIAGNOSIS — I495 Sick sinus syndrome: Secondary | ICD-10-CM | POA: Diagnosis not present

## 2017-01-12 DIAGNOSIS — I4891 Unspecified atrial fibrillation: Secondary | ICD-10-CM | POA: Diagnosis not present

## 2017-01-12 DIAGNOSIS — I5030 Unspecified diastolic (congestive) heart failure: Secondary | ICD-10-CM | POA: Diagnosis not present

## 2017-01-13 DIAGNOSIS — Z79899 Other long term (current) drug therapy: Secondary | ICD-10-CM | POA: Diagnosis not present

## 2017-01-14 DIAGNOSIS — C7889 Secondary malignant neoplasm of other digestive organs: Secondary | ICD-10-CM | POA: Diagnosis not present

## 2017-01-14 DIAGNOSIS — I4891 Unspecified atrial fibrillation: Secondary | ICD-10-CM | POA: Diagnosis not present

## 2017-01-14 DIAGNOSIS — I619 Nontraumatic intracerebral hemorrhage, unspecified: Secondary | ICD-10-CM | POA: Diagnosis not present

## 2017-01-14 DIAGNOSIS — I495 Sick sinus syndrome: Secondary | ICD-10-CM | POA: Diagnosis not present

## 2017-01-14 DIAGNOSIS — I5030 Unspecified diastolic (congestive) heart failure: Secondary | ICD-10-CM | POA: Diagnosis not present

## 2017-01-14 DIAGNOSIS — C259 Malignant neoplasm of pancreas, unspecified: Secondary | ICD-10-CM | POA: Diagnosis not present

## 2017-01-15 DIAGNOSIS — I5030 Unspecified diastolic (congestive) heart failure: Secondary | ICD-10-CM | POA: Diagnosis not present

## 2017-01-15 DIAGNOSIS — I619 Nontraumatic intracerebral hemorrhage, unspecified: Secondary | ICD-10-CM | POA: Diagnosis not present

## 2017-01-15 DIAGNOSIS — C7889 Secondary malignant neoplasm of other digestive organs: Secondary | ICD-10-CM | POA: Diagnosis not present

## 2017-01-15 DIAGNOSIS — I495 Sick sinus syndrome: Secondary | ICD-10-CM | POA: Diagnosis not present

## 2017-01-15 DIAGNOSIS — I4891 Unspecified atrial fibrillation: Secondary | ICD-10-CM | POA: Diagnosis not present

## 2017-01-15 DIAGNOSIS — C259 Malignant neoplasm of pancreas, unspecified: Secondary | ICD-10-CM | POA: Diagnosis not present

## 2017-01-19 ENCOUNTER — Telehealth: Payer: Self-pay | Admitting: Internal Medicine

## 2017-01-19 DIAGNOSIS — I5030 Unspecified diastolic (congestive) heart failure: Secondary | ICD-10-CM | POA: Diagnosis not present

## 2017-01-19 DIAGNOSIS — C259 Malignant neoplasm of pancreas, unspecified: Secondary | ICD-10-CM | POA: Diagnosis not present

## 2017-01-19 DIAGNOSIS — I495 Sick sinus syndrome: Secondary | ICD-10-CM | POA: Diagnosis not present

## 2017-01-19 DIAGNOSIS — I619 Nontraumatic intracerebral hemorrhage, unspecified: Secondary | ICD-10-CM | POA: Diagnosis not present

## 2017-01-19 DIAGNOSIS — C7889 Secondary malignant neoplasm of other digestive organs: Secondary | ICD-10-CM | POA: Diagnosis not present

## 2017-01-19 DIAGNOSIS — I4891 Unspecified atrial fibrillation: Secondary | ICD-10-CM | POA: Diagnosis not present

## 2017-01-19 NOTE — Telephone Encounter (Signed)
New message   Patient calling    1. Has your device fired? No   2. Is you device beeping? No   3. Are you experiencing draining or swelling at device site? No   4. Are you calling to see if we received your device transmission? Yes - missed appt on  6/14  5. Have you passed out? No

## 2017-01-19 NOTE — Telephone Encounter (Signed)
LMTCB/sss  Remote received on 12/25/16.

## 2017-01-20 DIAGNOSIS — R3915 Urgency of urination: Secondary | ICD-10-CM | POA: Diagnosis not present

## 2017-01-20 NOTE — Telephone Encounter (Signed)
Spoke w/ Santiago Glad and informed her to that we received transmission on 12-25-16. Pt daughter verbalized understanding.

## 2017-01-21 DIAGNOSIS — C7889 Secondary malignant neoplasm of other digestive organs: Secondary | ICD-10-CM | POA: Diagnosis not present

## 2017-01-21 DIAGNOSIS — K219 Gastro-esophageal reflux disease without esophagitis: Secondary | ICD-10-CM | POA: Diagnosis not present

## 2017-01-21 DIAGNOSIS — I5030 Unspecified diastolic (congestive) heart failure: Secondary | ICD-10-CM | POA: Diagnosis not present

## 2017-01-21 DIAGNOSIS — R251 Tremor, unspecified: Secondary | ICD-10-CM | POA: Diagnosis not present

## 2017-01-21 DIAGNOSIS — C259 Malignant neoplasm of pancreas, unspecified: Secondary | ICD-10-CM | POA: Diagnosis not present

## 2017-01-21 DIAGNOSIS — F339 Major depressive disorder, recurrent, unspecified: Secondary | ICD-10-CM | POA: Diagnosis not present

## 2017-01-21 DIAGNOSIS — I10 Essential (primary) hypertension: Secondary | ICD-10-CM | POA: Diagnosis not present

## 2017-01-21 DIAGNOSIS — I495 Sick sinus syndrome: Secondary | ICD-10-CM | POA: Diagnosis not present

## 2017-01-21 DIAGNOSIS — I619 Nontraumatic intracerebral hemorrhage, unspecified: Secondary | ICD-10-CM | POA: Diagnosis not present

## 2017-01-21 DIAGNOSIS — F0150 Vascular dementia without behavioral disturbance: Secondary | ICD-10-CM | POA: Diagnosis not present

## 2017-01-21 DIAGNOSIS — R569 Unspecified convulsions: Secondary | ICD-10-CM | POA: Diagnosis not present

## 2017-01-21 DIAGNOSIS — I4891 Unspecified atrial fibrillation: Secondary | ICD-10-CM | POA: Diagnosis not present

## 2017-01-21 DIAGNOSIS — E785 Hyperlipidemia, unspecified: Secondary | ICD-10-CM | POA: Diagnosis not present

## 2017-01-22 ENCOUNTER — Encounter: Payer: Self-pay | Admitting: Cardiology

## 2017-01-22 NOTE — Progress Notes (Signed)
Remote pacemaker transmission.   

## 2017-01-23 DIAGNOSIS — I5030 Unspecified diastolic (congestive) heart failure: Secondary | ICD-10-CM | POA: Diagnosis not present

## 2017-01-23 DIAGNOSIS — I4891 Unspecified atrial fibrillation: Secondary | ICD-10-CM | POA: Diagnosis not present

## 2017-01-23 DIAGNOSIS — C259 Malignant neoplasm of pancreas, unspecified: Secondary | ICD-10-CM | POA: Diagnosis not present

## 2017-01-23 DIAGNOSIS — I619 Nontraumatic intracerebral hemorrhage, unspecified: Secondary | ICD-10-CM | POA: Diagnosis not present

## 2017-01-23 DIAGNOSIS — I495 Sick sinus syndrome: Secondary | ICD-10-CM | POA: Diagnosis not present

## 2017-01-23 DIAGNOSIS — C7889 Secondary malignant neoplasm of other digestive organs: Secondary | ICD-10-CM | POA: Diagnosis not present

## 2017-01-25 DIAGNOSIS — I495 Sick sinus syndrome: Secondary | ICD-10-CM | POA: Diagnosis not present

## 2017-01-25 DIAGNOSIS — I4891 Unspecified atrial fibrillation: Secondary | ICD-10-CM | POA: Diagnosis not present

## 2017-01-25 DIAGNOSIS — C7889 Secondary malignant neoplasm of other digestive organs: Secondary | ICD-10-CM | POA: Diagnosis not present

## 2017-01-25 DIAGNOSIS — I619 Nontraumatic intracerebral hemorrhage, unspecified: Secondary | ICD-10-CM | POA: Diagnosis not present

## 2017-01-25 DIAGNOSIS — C259 Malignant neoplasm of pancreas, unspecified: Secondary | ICD-10-CM | POA: Diagnosis not present

## 2017-01-25 DIAGNOSIS — I5030 Unspecified diastolic (congestive) heart failure: Secondary | ICD-10-CM | POA: Diagnosis not present

## 2017-01-26 DIAGNOSIS — C7889 Secondary malignant neoplasm of other digestive organs: Secondary | ICD-10-CM | POA: Diagnosis not present

## 2017-01-26 DIAGNOSIS — C259 Malignant neoplasm of pancreas, unspecified: Secondary | ICD-10-CM | POA: Diagnosis not present

## 2017-01-26 DIAGNOSIS — I5030 Unspecified diastolic (congestive) heart failure: Secondary | ICD-10-CM | POA: Diagnosis not present

## 2017-01-26 DIAGNOSIS — I619 Nontraumatic intracerebral hemorrhage, unspecified: Secondary | ICD-10-CM | POA: Diagnosis not present

## 2017-01-26 DIAGNOSIS — I495 Sick sinus syndrome: Secondary | ICD-10-CM | POA: Diagnosis not present

## 2017-01-26 DIAGNOSIS — I4891 Unspecified atrial fibrillation: Secondary | ICD-10-CM | POA: Diagnosis not present

## 2017-01-27 DIAGNOSIS — I619 Nontraumatic intracerebral hemorrhage, unspecified: Secondary | ICD-10-CM | POA: Diagnosis not present

## 2017-01-27 DIAGNOSIS — I5030 Unspecified diastolic (congestive) heart failure: Secondary | ICD-10-CM | POA: Diagnosis not present

## 2017-01-27 DIAGNOSIS — C259 Malignant neoplasm of pancreas, unspecified: Secondary | ICD-10-CM | POA: Diagnosis not present

## 2017-01-27 DIAGNOSIS — C7889 Secondary malignant neoplasm of other digestive organs: Secondary | ICD-10-CM | POA: Diagnosis not present

## 2017-01-27 DIAGNOSIS — I495 Sick sinus syndrome: Secondary | ICD-10-CM | POA: Diagnosis not present

## 2017-01-27 DIAGNOSIS — I4891 Unspecified atrial fibrillation: Secondary | ICD-10-CM | POA: Diagnosis not present

## 2017-01-27 DIAGNOSIS — R451 Restlessness and agitation: Secondary | ICD-10-CM | POA: Diagnosis not present

## 2017-02-16 ENCOUNTER — Encounter: Payer: Self-pay | Admitting: Cardiology

## 2017-02-16 ENCOUNTER — Encounter: Payer: Self-pay | Admitting: Internal Medicine

## 2017-02-16 ENCOUNTER — Encounter: Payer: Self-pay | Admitting: Neurology

## 2017-02-16 ENCOUNTER — Encounter: Payer: Self-pay | Admitting: Physician Assistant

## 2017-02-16 ENCOUNTER — Encounter: Payer: Self-pay | Admitting: Hematology

## 2017-02-16 ENCOUNTER — Telehealth: Payer: Self-pay | Admitting: *Deleted

## 2017-02-16 LAB — CUP PACEART REMOTE DEVICE CHECK
Battery Impedance: 904 Ohm
Battery Remaining Longevity: 70 mo
Brady Statistic AP VP Percent: 0 %
Brady Statistic AS VP Percent: 0 %
Brady Statistic AS VS Percent: 7 %
Date Time Interrogation Session: 20180705213824
Implantable Lead Implant Date: 20130319
Implantable Lead Implant Date: 20130319
Implantable Lead Location: 753859
Implantable Pulse Generator Implant Date: 20130319
Lead Channel Impedance Value: 493 Ohm
Lead Channel Impedance Value: 823 Ohm
Lead Channel Pacing Threshold Amplitude: 0.75 V
Lead Channel Pacing Threshold Amplitude: 1.125 V
Lead Channel Pacing Threshold Pulse Width: 0.4 ms
Lead Channel Setting Pacing Amplitude: 1.5 V
Lead Channel Setting Pacing Amplitude: 4 V
Lead Channel Setting Pacing Pulse Width: 1 ms
Lead Channel Setting Sensing Sensitivity: 2 mV
MDC IDC LEAD LOCATION: 753860
MDC IDC MSMT BATTERY VOLTAGE: 2.78 V
MDC IDC MSMT LEADCHNL RA PACING THRESHOLD PULSEWIDTH: 0.4 ms
MDC IDC STAT BRADY AP VS PERCENT: 93 %

## 2017-02-16 NOTE — Telephone Encounter (Signed)
Ann Kaufman, dob 04-11-1930, passed away on Jan 29, 2017.     Please close out her files. Thank you.   Lin Landsman, daughter   [phone]

## 2017-02-17 NOTE — Progress Notes (Signed)
Ann Kaufman, dob 08/18/1929, passed away on February 05, 2017.   Please cancel her upcoming appointments and close out her files. Thank you.  Lin Landsman, daughter  [phone]   All patients appts are cancel at [facility]. Unable to respond by mychart its noted.

## 2017-02-21 DEATH — deceased

## 2017-03-18 ENCOUNTER — Encounter: Payer: Medicare Other | Admitting: Internal Medicine

## 2017-04-23 ENCOUNTER — Encounter

## 2018-07-10 IMAGING — CT CT CERVICAL SPINE W/O CM
4 of 8 series · 14 of 33 positions shown, 15 images · non-contrast
Comparison: 12/03/2014

CLINICAL DATA: Head injury status post fall.

EXAM:
CT HEAD WITHOUT CONTRAST
CT CERVICAL SPINE WITHOUT CONTRAST
TECHNIQUE: Multidetector CT imaging of the head and cervical spine was
performed following the standard protocol without intravenous
contrast. Multiplanar CT image reconstructions of the cervical spine
were also generated.

[Series 5: coronal · coronal · 0.30mm/px · 3 of 73 slices shown]
[im 19/73  bone]
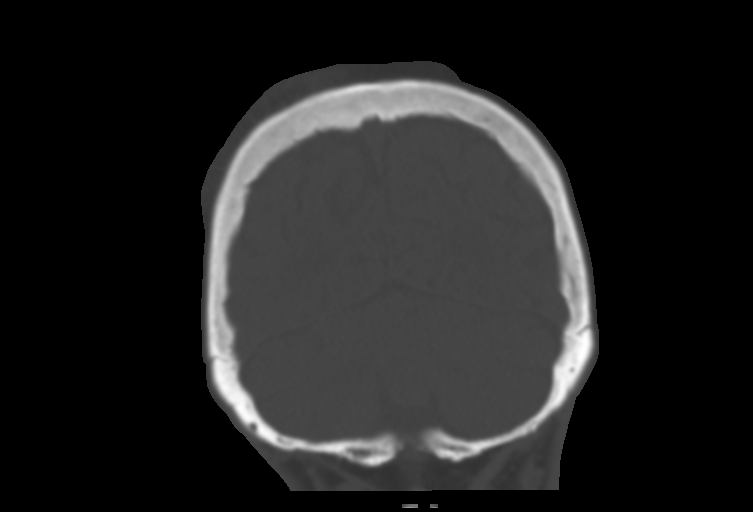
[im 37/73  bone]
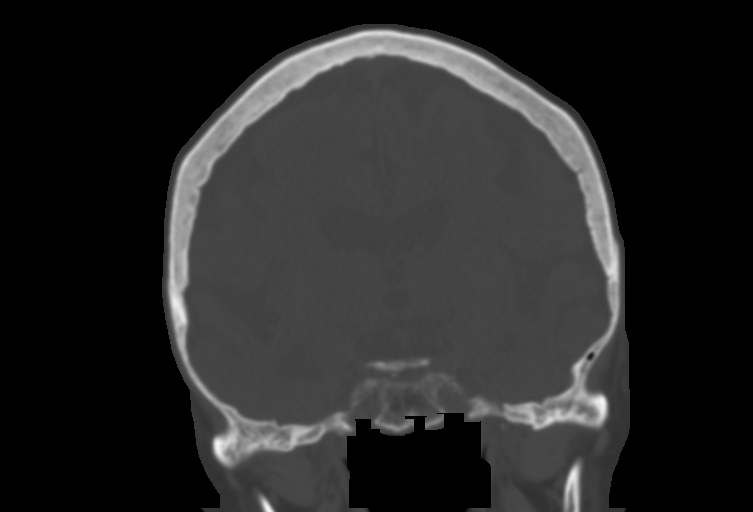
[im 55/73  bone]
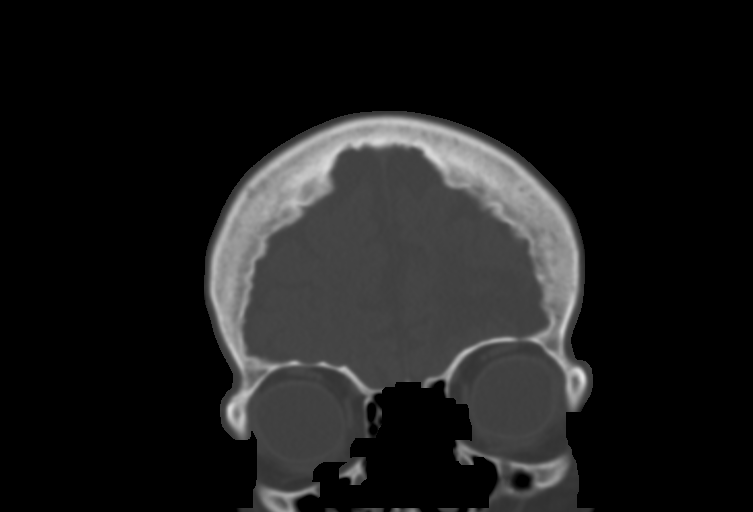

[Series 6: sagittal · sagittal · 0.30mm/px · 5 of 74 slices shown]
[im 13/74  bone]
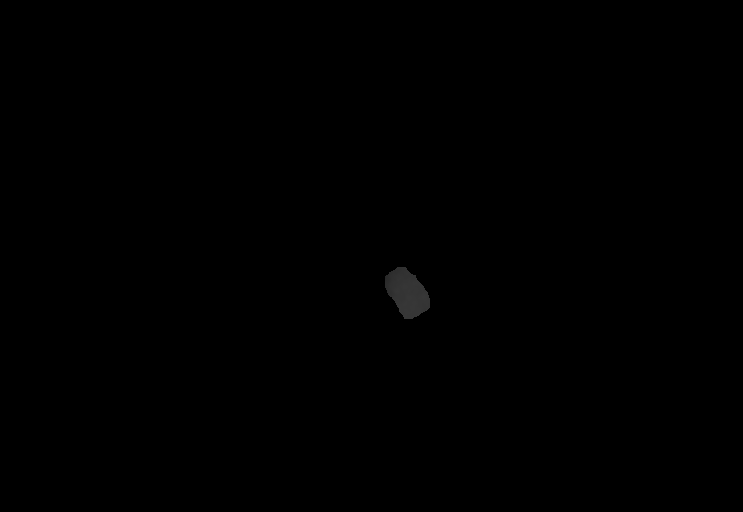
[im 25/74  bone]
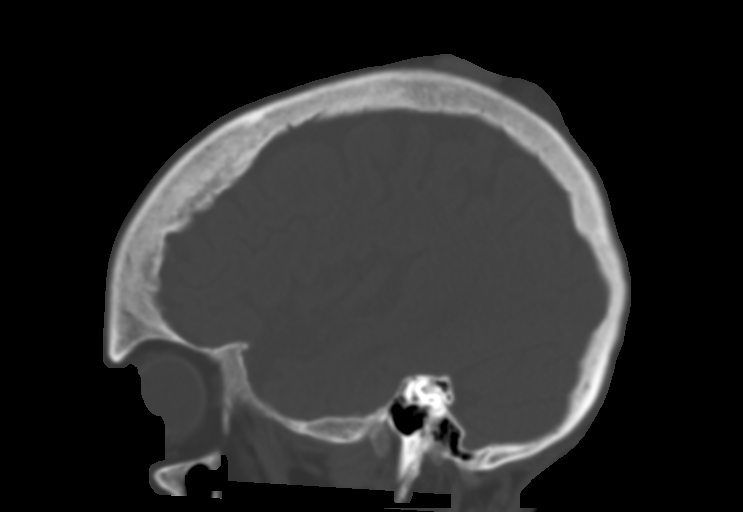
[im 37/74  bone]
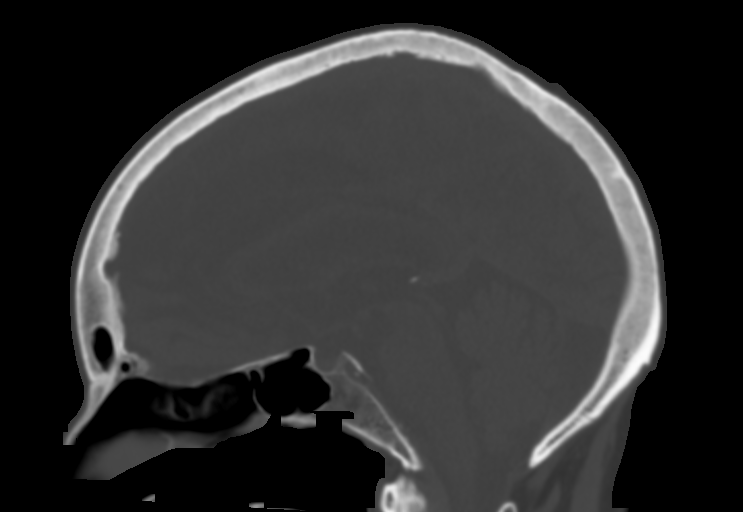
[im 49/74  bone]
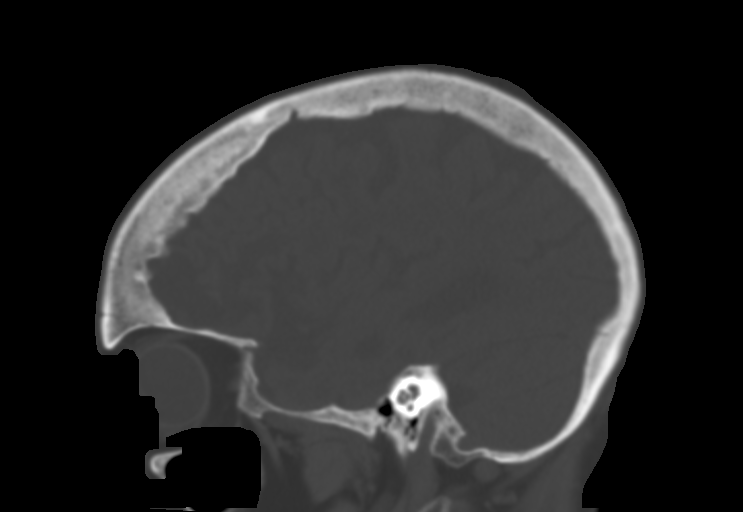
[im 61/74  bone]
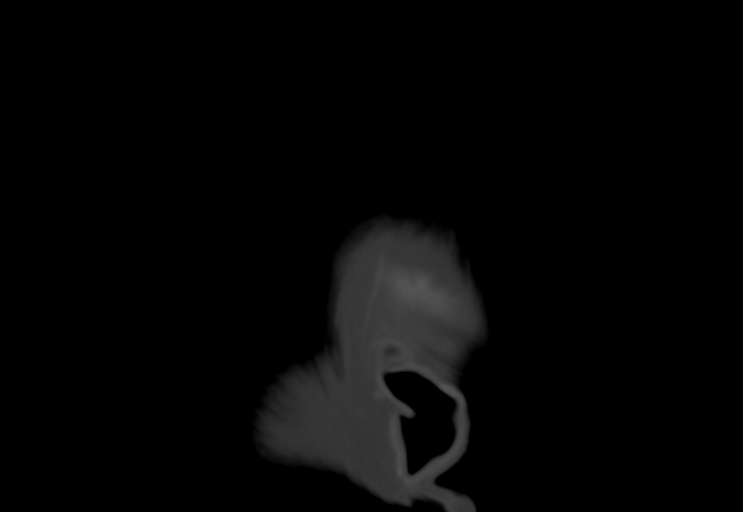

[Series 7: c-spine st · axial · 0.31mm/px · z∈[+1374,+1446]mm · 3 of 69 slices shown, 4 images]
[im 18/69  soft-tissue]
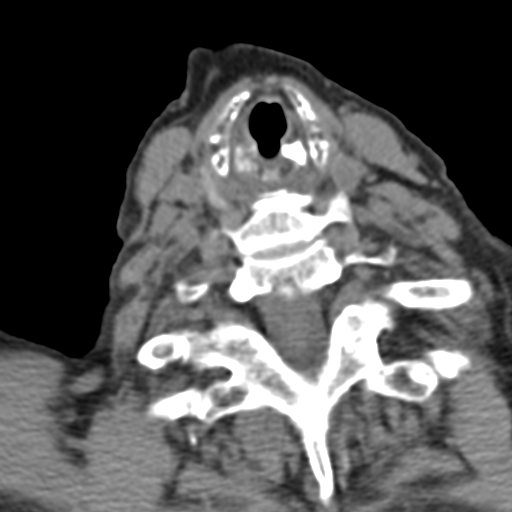
[im 18/69  bone]
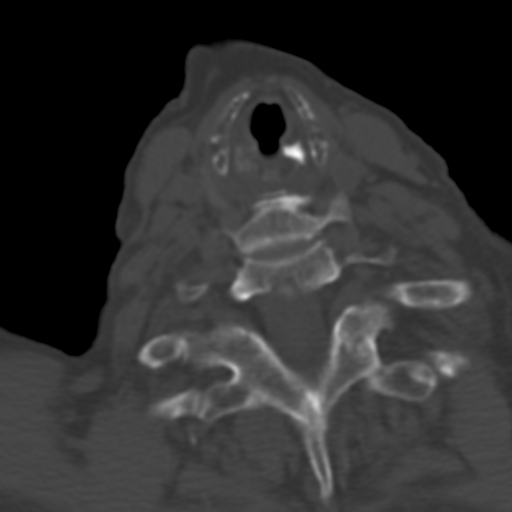
[im 35/69  bone]
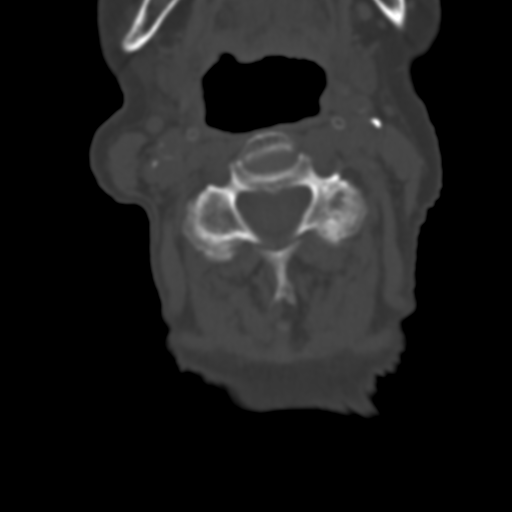
[im 52/69  bone]
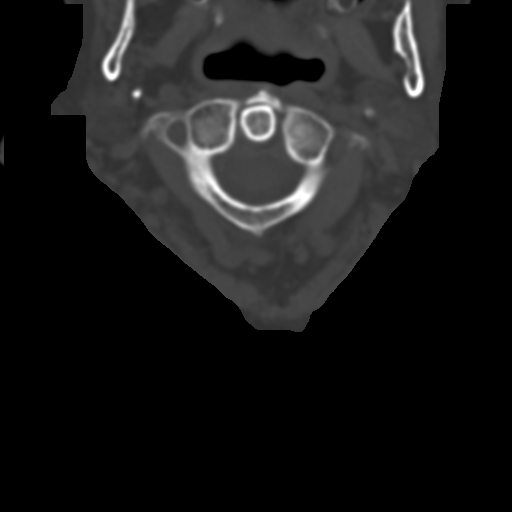

[Series 10: axial recon · axial · 0.23mm/px · z∈[+1347,+1420]mm · 3 of 82 slices shown]
[im 21/82  bone]
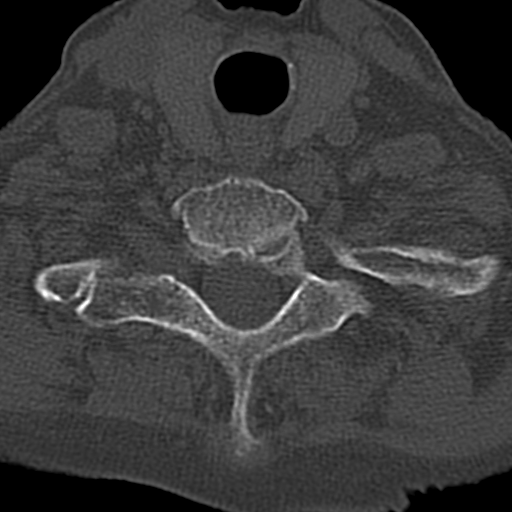
[im 41/82  bone]
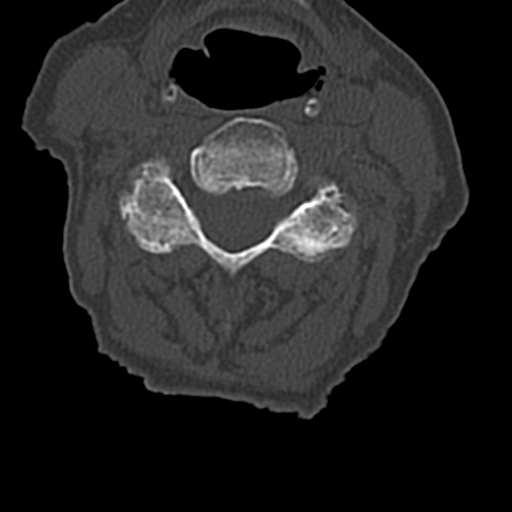
[im 61/82  bone]
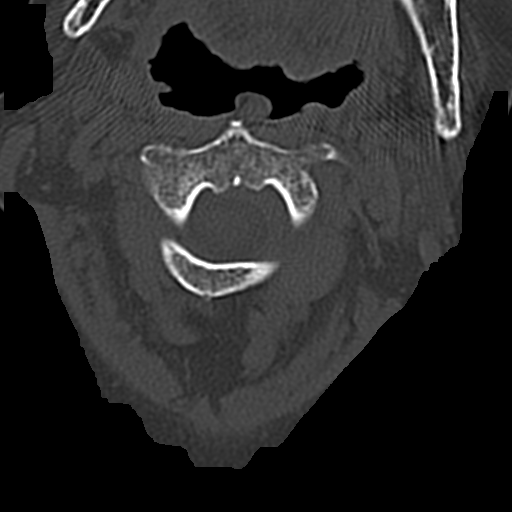

[14 of 33 positions shown; findings below may reference images not displayed]

FINDINGS: CT HEAD FINDINGS

Brain: No evidence of acute infarction, hemorrhage, hydrocephalus,
extra-axial collection or mass lesion/mass effect. Chronic
encephalomalacia in the inferior left frontal lobe is unchanged,
with mild associated ex vacuo dilatation of the left frontal horn of
the third ventricle. Stable brain parenchymal volume loss and
periventricular white matter hypodensities, usually associated with
chronic small vessel ischemic disease.

Vascular: No hyperdense vessel or unexpected calcification.

Skull: Normal. Negative for fracture or focal lesion.

Sinuses/Orbits: No acute finding.

Other: Prior bilateral cataract extraction.

CT CERVICAL SPINE FINDINGS

Alignment: Minimal anterolisthesis of C4 on C5 and posterior
listhesis of C5 on C6, and anterolisthesis of C6 on C7, likely
degenerative.

Skull base and vertebrae: No acute fracture. No primary bone lesion
or focal pathologic process.

Soft tissues and spinal canal: No prevertebral fluid or swelling. No
visible canal hematoma.

Disc levels: Multilevel osteoarthritic changes with disc space
narrowing, mild vertebral body remodeling and osteophyte formation.
Associated moderate to severe posterior facet arthropathy.

Upper chest: Negative.

Other: None.
IMPRESSION: No acute intracranial abnormality.

Stable brain parenchymal atrophy and chronic microvascular disease.

No evidence of acute injury to the cervical spine.

Multilevel osteoarthritic changes with associated mild alignment
abnormalities, likely due to severe posterior facet arthropathy.
